# Patient Record
Sex: Female | Born: 1940
Health system: Southern US, Community
[De-identification: ages and names within clinical notes are randomized; demographics above are authoritative.]

## PROBLEM LIST (undated history)

## (undated) DIAGNOSIS — K219 Gastro-esophageal reflux disease without esophagitis: Secondary | ICD-10-CM

## (undated) DIAGNOSIS — I2510 Atherosclerotic heart disease of native coronary artery without angina pectoris: Secondary | ICD-10-CM

## (undated) DIAGNOSIS — M549 Dorsalgia, unspecified: Secondary | ICD-10-CM

## (undated) DIAGNOSIS — I10 Essential (primary) hypertension: Principal | ICD-10-CM

## (undated) DIAGNOSIS — I119 Hypertensive heart disease without heart failure: Secondary | ICD-10-CM

## (undated) DIAGNOSIS — F419 Anxiety disorder, unspecified: Secondary | ICD-10-CM

## (undated) DIAGNOSIS — D649 Anemia, unspecified: Secondary | ICD-10-CM

## (undated) DIAGNOSIS — E559 Vitamin D deficiency, unspecified: Secondary | ICD-10-CM

## (undated) DIAGNOSIS — E785 Hyperlipidemia, unspecified: Secondary | ICD-10-CM

## (undated) DIAGNOSIS — D696 Thrombocytopenia, unspecified: Secondary | ICD-10-CM

## (undated) DIAGNOSIS — I7090 Unspecified atherosclerosis: Secondary | ICD-10-CM

## (undated) HISTORY — PX: SHOULDER SURGERY: SHX246

## (undated) HISTORY — DX: Anxiety disorder, unspecified: F41.9

## (undated) HISTORY — DX: Gastro-esophageal reflux disease without esophagitis: K21.9

## (undated) HISTORY — DX: Essential (primary) hypertension: I10

## (undated) HISTORY — PX: EYE SURGERY: SHX253

## (undated) HISTORY — PX: TONSILLECTOMY: SUR1361

## (undated) HISTORY — DX: Vitamin D deficiency, unspecified: E55.9

---

## 1998-06-01 ENCOUNTER — Other Ambulatory Visit: Admission: RE | Admit: 1998-06-01 | Discharge: 1998-06-01 | Payer: Self-pay | Admitting: Physician Assistant

## 1999-04-30 ENCOUNTER — Ambulatory Visit (HOSPITAL_COMMUNITY): Admission: RE | Admit: 1999-04-30 | Discharge: 1999-04-30 | Payer: Self-pay | Admitting: Internal Medicine

## 1999-06-18 ENCOUNTER — Other Ambulatory Visit: Admission: RE | Admit: 1999-06-18 | Discharge: 1999-06-18 | Payer: Self-pay | Admitting: Gynecology

## 1999-11-02 ENCOUNTER — Encounter: Payer: Self-pay | Admitting: Internal Medicine

## 1999-11-02 ENCOUNTER — Encounter: Admission: RE | Admit: 1999-11-02 | Discharge: 1999-11-02 | Payer: Self-pay | Admitting: Internal Medicine

## 2000-04-18 ENCOUNTER — Encounter: Payer: Self-pay | Admitting: Internal Medicine

## 2000-04-18 ENCOUNTER — Encounter: Admission: RE | Admit: 2000-04-18 | Discharge: 2000-04-18 | Payer: Self-pay | Admitting: Internal Medicine

## 2000-05-27 ENCOUNTER — Ambulatory Visit (HOSPITAL_COMMUNITY): Admission: RE | Admit: 2000-05-27 | Discharge: 2000-05-27 | Payer: Self-pay | Admitting: Urology

## 2000-05-27 ENCOUNTER — Encounter (INDEPENDENT_AMBULATORY_CARE_PROVIDER_SITE_OTHER): Payer: Self-pay

## 2000-06-18 ENCOUNTER — Other Ambulatory Visit: Admission: RE | Admit: 2000-06-18 | Discharge: 2000-06-18 | Payer: Self-pay | Admitting: Gynecology

## 2001-04-13 ENCOUNTER — Encounter: Payer: Self-pay | Admitting: Cardiology

## 2001-04-13 ENCOUNTER — Ambulatory Visit (HOSPITAL_COMMUNITY): Admission: RE | Admit: 2001-04-13 | Discharge: 2001-04-13 | Payer: Self-pay | Admitting: Cardiology

## 2001-06-18 ENCOUNTER — Other Ambulatory Visit: Admission: RE | Admit: 2001-06-18 | Discharge: 2001-06-18 | Payer: Self-pay | Admitting: Gynecology

## 2001-08-07 ENCOUNTER — Ambulatory Visit (HOSPITAL_COMMUNITY): Admission: RE | Admit: 2001-08-07 | Discharge: 2001-08-07 | Payer: Self-pay | Admitting: *Deleted

## 2001-08-27 ENCOUNTER — Encounter: Payer: Self-pay | Admitting: Emergency Medicine

## 2001-08-27 ENCOUNTER — Emergency Department (HOSPITAL_COMMUNITY): Admission: EM | Admit: 2001-08-27 | Discharge: 2001-08-27 | Payer: Self-pay | Admitting: Emergency Medicine

## 2002-01-25 ENCOUNTER — Ambulatory Visit (HOSPITAL_COMMUNITY): Admission: RE | Admit: 2002-01-25 | Discharge: 2002-01-25 | Payer: Self-pay | Admitting: *Deleted

## 2002-01-26 ENCOUNTER — Encounter: Payer: Self-pay | Admitting: Cardiovascular Disease

## 2002-01-26 ENCOUNTER — Emergency Department (HOSPITAL_COMMUNITY): Admission: EM | Admit: 2002-01-26 | Discharge: 2002-01-26 | Payer: Self-pay | Admitting: Emergency Medicine

## 2002-02-01 ENCOUNTER — Ambulatory Visit (HOSPITAL_COMMUNITY): Admission: RE | Admit: 2002-02-01 | Discharge: 2002-02-01 | Payer: Self-pay | Admitting: *Deleted

## 2002-02-25 ENCOUNTER — Ambulatory Visit (HOSPITAL_COMMUNITY): Admission: RE | Admit: 2002-02-25 | Discharge: 2002-02-25 | Payer: Self-pay | Admitting: *Deleted

## 2002-02-25 ENCOUNTER — Encounter: Payer: Self-pay | Admitting: *Deleted

## 2002-03-18 ENCOUNTER — Ambulatory Visit (HOSPITAL_COMMUNITY): Admission: RE | Admit: 2002-03-18 | Discharge: 2002-03-18 | Payer: Self-pay | Admitting: Pulmonary Disease

## 2002-03-18 ENCOUNTER — Encounter: Payer: Self-pay | Admitting: Pulmonary Disease

## 2002-04-06 ENCOUNTER — Ambulatory Visit (HOSPITAL_COMMUNITY): Admission: RE | Admit: 2002-04-06 | Discharge: 2002-04-06 | Payer: Self-pay | Admitting: Pulmonary Disease

## 2002-04-06 ENCOUNTER — Encounter: Payer: Self-pay | Admitting: Pulmonary Disease

## 2002-04-23 ENCOUNTER — Encounter: Admission: RE | Admit: 2002-04-23 | Discharge: 2002-04-23 | Payer: Self-pay | Admitting: Pulmonary Disease

## 2002-04-23 ENCOUNTER — Encounter: Payer: Self-pay | Admitting: Pulmonary Disease

## 2002-06-21 ENCOUNTER — Other Ambulatory Visit: Admission: RE | Admit: 2002-06-21 | Discharge: 2002-06-21 | Payer: Self-pay | Admitting: Gynecology

## 2003-01-26 ENCOUNTER — Encounter: Payer: Self-pay | Admitting: Emergency Medicine

## 2003-01-26 ENCOUNTER — Emergency Department (HOSPITAL_COMMUNITY): Admission: EM | Admit: 2003-01-26 | Discharge: 2003-01-26 | Payer: Self-pay | Admitting: Emergency Medicine

## 2003-06-22 ENCOUNTER — Other Ambulatory Visit: Admission: RE | Admit: 2003-06-22 | Discharge: 2003-06-22 | Payer: Self-pay | Admitting: Gynecology

## 2004-09-13 ENCOUNTER — Other Ambulatory Visit: Admission: RE | Admit: 2004-09-13 | Discharge: 2004-09-13 | Payer: Self-pay | Admitting: Family Medicine

## 2005-01-14 ENCOUNTER — Ambulatory Visit (HOSPITAL_COMMUNITY): Admission: RE | Admit: 2005-01-14 | Discharge: 2005-01-14 | Payer: Self-pay | Admitting: Gastroenterology

## 2005-01-14 ENCOUNTER — Encounter (INDEPENDENT_AMBULATORY_CARE_PROVIDER_SITE_OTHER): Payer: Self-pay | Admitting: Specialist

## 2005-02-20 ENCOUNTER — Ambulatory Visit (HOSPITAL_COMMUNITY): Admission: RE | Admit: 2005-02-20 | Discharge: 2005-02-20 | Payer: Self-pay | Admitting: Obstetrics and Gynecology

## 2005-03-06 ENCOUNTER — Ambulatory Visit (HOSPITAL_COMMUNITY): Admission: RE | Admit: 2005-03-06 | Discharge: 2005-03-06 | Payer: Self-pay | Admitting: Obstetrics and Gynecology

## 2005-04-02 ENCOUNTER — Ambulatory Visit: Admission: RE | Admit: 2005-04-02 | Discharge: 2005-04-02 | Payer: Self-pay | Admitting: Family Medicine

## 2005-04-03 ENCOUNTER — Ambulatory Visit (HOSPITAL_COMMUNITY): Admission: RE | Admit: 2005-04-03 | Discharge: 2005-04-03 | Payer: Self-pay | Admitting: Gastroenterology

## 2005-04-12 ENCOUNTER — Ambulatory Visit: Admission: RE | Admit: 2005-04-12 | Discharge: 2005-04-12 | Payer: Self-pay | Admitting: Family Medicine

## 2005-06-06 ENCOUNTER — Emergency Department (HOSPITAL_COMMUNITY): Admission: EM | Admit: 2005-06-06 | Discharge: 2005-06-06 | Payer: Self-pay | Admitting: Emergency Medicine

## 2006-03-21 ENCOUNTER — Encounter: Admission: RE | Admit: 2006-03-21 | Discharge: 2006-03-21 | Payer: Self-pay | Admitting: Cardiovascular Disease

## 2006-09-13 ENCOUNTER — Emergency Department (HOSPITAL_COMMUNITY): Admission: EM | Admit: 2006-09-13 | Discharge: 2006-09-13 | Payer: Self-pay | Admitting: Emergency Medicine

## 2007-06-03 ENCOUNTER — Emergency Department (HOSPITAL_COMMUNITY): Admission: EM | Admit: 2007-06-03 | Discharge: 2007-06-04 | Payer: Self-pay | Admitting: Emergency Medicine

## 2007-06-25 ENCOUNTER — Encounter: Admission: RE | Admit: 2007-06-25 | Discharge: 2007-06-25 | Payer: Self-pay | Admitting: Neurological Surgery

## 2008-03-04 ENCOUNTER — Emergency Department (HOSPITAL_COMMUNITY): Admission: EM | Admit: 2008-03-04 | Discharge: 2008-03-04 | Payer: Self-pay | Admitting: Emergency Medicine

## 2008-04-16 ENCOUNTER — Encounter (INDEPENDENT_AMBULATORY_CARE_PROVIDER_SITE_OTHER): Payer: Self-pay | Admitting: Emergency Medicine

## 2008-04-16 ENCOUNTER — Ambulatory Visit: Payer: Self-pay | Admitting: Vascular Surgery

## 2008-04-16 ENCOUNTER — Inpatient Hospital Stay (HOSPITAL_COMMUNITY): Admission: EM | Admit: 2008-04-16 | Discharge: 2008-04-19 | Payer: Self-pay | Admitting: Emergency Medicine

## 2008-09-08 ENCOUNTER — Encounter: Admission: RE | Admit: 2008-09-08 | Discharge: 2008-09-08 | Payer: Self-pay | Admitting: Cardiovascular Disease

## 2008-10-04 ENCOUNTER — Encounter: Admission: RE | Admit: 2008-10-04 | Discharge: 2008-10-04 | Payer: Self-pay | Admitting: Gastroenterology

## 2009-01-26 ENCOUNTER — Encounter: Admission: RE | Admit: 2009-01-26 | Discharge: 2009-01-26 | Payer: Self-pay | Admitting: Cardiovascular Disease

## 2009-04-20 ENCOUNTER — Encounter: Admission: RE | Admit: 2009-04-20 | Discharge: 2009-04-20 | Payer: Self-pay | Admitting: Family Medicine

## 2009-09-03 ENCOUNTER — Inpatient Hospital Stay (HOSPITAL_COMMUNITY): Admission: EM | Admit: 2009-09-03 | Discharge: 2009-09-06 | Payer: Self-pay | Admitting: Emergency Medicine

## 2009-09-04 ENCOUNTER — Encounter (INDEPENDENT_AMBULATORY_CARE_PROVIDER_SITE_OTHER): Payer: Self-pay | Admitting: Cardiovascular Disease

## 2010-07-05 ENCOUNTER — Encounter: Admission: RE | Admit: 2010-07-05 | Discharge: 2010-07-05 | Payer: Self-pay | Admitting: Family Medicine

## 2010-08-20 ENCOUNTER — Encounter: Admission: RE | Admit: 2010-08-20 | Discharge: 2010-08-20 | Payer: Self-pay | Admitting: Family Medicine

## 2010-10-30 ENCOUNTER — Encounter: Admission: RE | Admit: 2010-10-30 | Discharge: 2010-10-30 | Payer: Self-pay | Admitting: Gastroenterology

## 2010-11-15 ENCOUNTER — Encounter: Admission: RE | Admit: 2010-11-15 | Discharge: 2010-11-15 | Payer: Self-pay | Admitting: Family Medicine

## 2011-03-19 ENCOUNTER — Other Ambulatory Visit: Payer: Self-pay | Admitting: Family Medicine

## 2011-03-19 DIAGNOSIS — R921 Mammographic calcification found on diagnostic imaging of breast: Secondary | ICD-10-CM

## 2011-04-05 LAB — CARDIAC PANEL(CRET KIN+CKTOT+MB+TROPI)
CK, MB: 2.2 ng/mL (ref 0.3–4.0)
CK, MB: 3.2 ng/mL (ref 0.3–4.0)
Relative Index: 1.5 (ref 0.0–2.5)
Relative Index: 1.9 (ref 0.0–2.5)
Total CK: 151 U/L (ref 7–177)
Total CK: 168 U/L (ref 7–177)
Troponin I: 0.02 ng/mL (ref 0.00–0.06)
Troponin I: 0.03 ng/mL (ref 0.00–0.06)

## 2011-04-05 LAB — BASIC METABOLIC PANEL
CO2: 27 mEq/L (ref 19–32)
Calcium: 8.7 mg/dL (ref 8.4–10.5)
Chloride: 114 mEq/L — ABNORMAL HIGH (ref 96–112)
Creatinine, Ser: 0.97 mg/dL (ref 0.4–1.2)
GFR calc Af Amer: >60 mL/min (ref >60)
Glucose, Bld: 95 mg/dL (ref 70–99)

## 2011-04-05 LAB — CBC
HCT: 30.1 % — ABNORMAL LOW (ref 36.0–46.0)
HCT: 36.8 % (ref 36.0–46.0)
HCT: 41.1 % (ref 36.0–46.0)
Hemoglobin: 11.7 g/dL — ABNORMAL LOW (ref 12.0–15.0)
Hemoglobin: 13.1 g/dL (ref 12.0–15.0)
Hemoglobin: 9.7 g/dL — ABNORMAL LOW (ref 12.0–15.0)
MCHC: 31.9 g/dL (ref 30.0–36.0)
MCHC: 32.2 g/dL (ref 30.0–36.0)
MCV: 73.5 fL — ABNORMAL LOW (ref 78.0–100.0)
MCV: 73.5 fL — ABNORMAL LOW (ref 78.0–100.0)
Platelets: 102 K/uL — ABNORMAL LOW (ref 150–400)
Platelets: 125 K/uL — ABNORMAL LOW (ref 150–400)
Platelets: 150 10*3/uL (ref 150–400)
RBC: 4.1 MIL/uL (ref 3.87–5.11)
RBC: 5 MIL/uL (ref 3.87–5.11)
RDW: 14.3 % (ref 11.5–15.5)
RDW: 14.6 % (ref 11.5–15.5)
WBC: 4.4 K/uL (ref 4.0–10.5)
WBC: 5.3 10*3/uL (ref 4.0–10.5)
WBC: 5.3 K/uL (ref 4.0–10.5)

## 2011-04-05 LAB — DIFFERENTIAL
Basophils Absolute: 0 10*3/uL (ref 0.0–0.1)
Basophils Absolute: 0 K/uL (ref 0.0–0.1)
Basophils Relative: 0 % (ref 0–1)
Basophils Relative: 1 % (ref 0–1)
Eosinophils Absolute: 0 10*3/uL (ref 0.0–0.7)
Eosinophils Absolute: 0 K/uL (ref 0.0–0.7)
Eosinophils Relative: 1 % (ref 0–5)
Lymphocytes Relative: 53 % — ABNORMAL HIGH (ref 12–46)
Lymphs Abs: 2.8 K/uL (ref 0.7–4.0)
Monocytes Absolute: 0.4 10*3/uL (ref 0.1–1.0)
Monocytes Absolute: 0.4 K/uL (ref 0.1–1.0)
Monocytes Relative: 7 % (ref 3–12)
Monocytes Relative: 8 % (ref 3–12)
Neutro Abs: 2.1 K/uL (ref 1.7–7.7)
Neutrophils Relative %: 39 % — ABNORMAL LOW (ref 43–77)

## 2011-04-05 LAB — POCT I-STAT, CHEM 8
HCT: 41 % (ref 36.0–46.0)
Hemoglobin: 13.9 g/dL (ref 12.0–15.0)
Potassium: 3.4 mEq/L — ABNORMAL LOW (ref 3.5–5.1)
Sodium: 140 mEq/L (ref 135–145)

## 2011-04-05 LAB — COMPREHENSIVE METABOLIC PANEL WITH GFR
ALT: 16 U/L (ref 0–35)
AST: 26 U/L (ref 0–37)
Albumin: 4.2 g/dL (ref 3.5–5.2)
Alkaline Phosphatase: 101 U/L (ref 39–117)
BUN: 11 mg/dL (ref 6–23)
CO2: 23 meq/L (ref 19–32)
Calcium: 10.2 mg/dL (ref 8.4–10.5)
Chloride: 107 meq/L (ref 96–112)
Creatinine, Ser: 0.94 mg/dL (ref 0.4–1.2)
GFR calc Af Amer: >60 mL/min (ref >60)
GFR calc non Af Amer: 59 mL/min — ABNORMAL LOW (ref >60)
Glucose, Bld: 93 mg/dL (ref 70–99)
Potassium: 3.4 meq/L — ABNORMAL LOW (ref 3.5–5.1)
Sodium: 141 meq/L (ref 135–145)
Total Bilirubin: 1.1 mg/dL (ref 0.3–1.2)
Total Protein: 7.9 g/dL (ref 6.0–8.3)

## 2011-04-05 LAB — POCT CARDIAC MARKERS
CKMB, poc: 1.9 ng/mL (ref 1.0–8.0)
CKMB, poc: 2 ng/mL (ref 1.0–8.0)
CKMB, poc: <1 ng/mL — ABNORMAL LOW (ref 1.0–8.0)
Myoglobin, poc: 112 ng/mL (ref 12–200)
Myoglobin, poc: 84.1 ng/mL (ref 12–200)
Troponin i, poc: <0.05 ng/mL (ref 0.00–0.09)

## 2011-04-05 LAB — LIPID PANEL
Cholesterol: 240 mg/dL — ABNORMAL HIGH (ref 0–200)
HDL: 80 mg/dL (ref >39)
LDL Cholesterol: 154 mg/dL — ABNORMAL HIGH (ref 0–99)
Total CHOL/HDL Ratio: 3 ratio
Triglycerides: 31 mg/dL (ref <150)
VLDL: 6 mg/dL (ref 0–40)

## 2011-04-05 LAB — PROTIME-INR
INR: 1.1 (ref 0.00–1.49)
Prothrombin Time: 13.7 s (ref 11.6–15.2)

## 2011-04-05 LAB — HEPARIN LEVEL (UNFRACTIONATED): Heparin Unfractionated: 1.34 [IU]/mL — ABNORMAL HIGH (ref 0.30–0.70)

## 2011-04-18 ENCOUNTER — Ambulatory Visit
Admission: RE | Admit: 2011-04-18 | Discharge: 2011-04-18 | Disposition: A | Discharge status: Home / Self Care | Payer: PRIVATE HEALTH INSURANCE | Source: Ambulatory Visit | Attending: Family Medicine | Admitting: Family Medicine

## 2011-04-18 ENCOUNTER — Other Ambulatory Visit: Payer: Self-pay | Admitting: Family Medicine

## 2011-04-18 DIAGNOSIS — N6310 Unspecified lump in the right breast, unspecified quadrant: Secondary | ICD-10-CM

## 2011-04-18 DIAGNOSIS — R921 Mammographic calcification found on diagnostic imaging of breast: Secondary | ICD-10-CM

## 2011-05-14 NOTE — H&P (Signed)
NAMEELLAH, OTTE          ACCOUNT NO.:  [id number]   MEDICAL RECORD NO.:  [id number]          PATIENT TYPE:  EMS   LOCATION:  ED                           FACILITY:  [facility]   PHYSICIAN:  Corinna L. Lendell Caprice, MDDATE OF BIRTH:  10-22-1941   DATE OF ADMISSION:  04/16/2008  DATE OF DISCHARGE:                              HISTORY & PHYSICAL   CHIEF COMPLAINT:  Right hip and buttock pain.   HISTORY OF PRESENT ILLNESS:  Ms. Navia is a pleasant 70 year old  black female patient of Dr. Cliffton Asters who presents with sudden onset of  right hip and buttock pain while walking yesterday.  She was unable to  sleep much it hurt so severely.  She took two Percocet in the middle of  the night which provided some relief.  She went to the [facility] walk-in  clinic and received Toradol without any improvement in her symptoms.  She was therefore sent to the emergency room for further evaluation.  The patient says the pain is constant.  She has not fallen. She has no  bruising.  She feels a little better currently after receiving 800 mg of  ibuprofen here in the emergency room.  She is very reluctant to take any  opiate analgesics.   PAST MEDICAL HISTORY:  Hypertension, hyperlipidemia, gastroesophageal  reflux disease, anxiety, chronic neck pain, interstitial cystitis.   MEDICATIONS:  1. Benicar 40 mg a day.  2. Crestor 5 mg a day.  3. Aciphex as needed.  4. Xanax as needed.  5. Percocet as needed.   ALLERGIES:  There is reported allergy to CODEINE, EPINEPHRINE, IV  CONTRAST, NITROGLYCERIN, PENICILLIN, SULFA.   SOCIAL HISTORY:  She is Jehovah's Witness.  She is here with her  husband.  She does not drink, smoke or use drugs.   FAMILY HISTORY:  Is noncontributory.   REVIEW OF SYSTEMS:  As above, otherwise negative.   PHYSICAL EXAMINATION:  VITAL SIGNS:  Temperature is 99.5, blood pressure  131/69, pulse 69, respiratory rate 16, oxygen saturation 93%.  GENERAL:  The patient is well-nourished,  well-developed and in no acute  distress.  HEENT:  Normocephalic, atraumatic.  Pupils equal, round, reactive to  light.  Moist mucous membranes.  NECK:  Neck is supple.  LUNGS:  Lungs are clear to auscultation bilaterally without wheezes,  rhonchi or rales.  CARDIOVASCULAR:  Regular rate and rhythm without murmurs, gallops or  rubs.  ABDOMEN:  Normal bowel sounds, soft, nontender, nondistended.  GU:  Deferred.  RECTAL:  Deferred.  EXTREMITIES:  She has tenderness over her right buttock and hip area.  She has no ecchymoses.  No swelling.  She has difficulty bearing weight  and almost fell when I asked her to walk.  SKIN:  No rash.  PSYCHIATRIC:  Normal affect.  NEUROLOGIC:  Alert and oriented.  Cranial nerves and sensorimotor exam  are intact.   Hip x-ray shows mild degenerative changes, consider followup MRI.  Doppler of the right leg shows no DVT.   ASSESSMENT AND PLAN:  1. Right hip pain, sudden onset with gait instability.  The patient      will be  placed on 24 hour observation.  I will get a physical      therapy consult.  I will get an MRI and continue nonsteroidal      antiinflammatories.  She does not want opiate analgesics if at all      possible and I will order Ultram as needed and Tylenol as needed.  2. Gastroesophageal reflux disease.  Continue proton pump inhibitor.  3. Hyperlipidemia with an elevated CPK, I will stop this and monitor      CPKs to rule out mild rhabdomyolysis.  I will also check liver      function tests.  4. Hypertension.  Continue Benicar.  5. Interstitial cystitis.      Corinna L. Lendell Caprice, MD  Electronically Signed     CLS/MEDQ  D:  04/16/2008  T:  04/16/2008  Job:  [id number]   cc:   Stacie Acres. Cliffton Asters, M.D.  Fax: [phone]

## 2011-05-14 NOTE — Op Note (Signed)
Donna Baldwin, Donna Baldwin          ACCOUNT NO.:  [id number]   MEDICAL RECORD NO.:  [id number]          PATIENT TYPE:  INP   LOCATION:  1504                         FACILITY:  [facility]   PHYSICIAN:  Jene Every, M.D.    DATE OF BIRTH:  1941/02/11   DATE OF PROCEDURE:  04/18/2008  DATE OF DISCHARGE:                               OPERATIVE REPORT   CHIEF COMPLAINT:  Right hip pain.   HISTORY:  A pleasant 70 year old female with sudden onset of right hip  pain this past Saturday when walking across the lawn after getting out  of a car.  She had not had any trauma, did not fall.  She had fairly  significant pain and was unable to sleep.  Seen in the [facility].  Received Toradol, had persistent pain and was admitted  following that for a further evaluation.  She subsequently underwent an  MRI of her right hip which indicated calcific tendinitis of the right  hip and was consulted for treatment recommendations.   She denies numbness or tingling in the lower extremity, fever or chills,  pain that awakens her at night.   PAST MEDICAL HISTORY:  Significant for  1. Hypertension.  2. Hyperlipidemia.  3. GERD.  4. Anxiety.  5. Interstitial cystitis.  6. Lumbar spondylosis.   MEDICATIONS:  Ultram, Benicar, Crestor, AcipHex, Xanax and Percocet.   SOCIAL HISTORY:  Jehovah's Witness.  No tobacco.  Lives with her  husband.   FAMILY HISTORY:  Noncontributory.   ALLERGIES:  CODEINE, EPINEPHRINE, IV CONTRAST, NITROGLYCERIN,  PENICILLIN, SULFA.   PHYSICAL EXAMINATION:  A healthy female in mild distress.  Mood and  affect is appropriate.  Straight leg raising on the right produces  buttock pain.  She is exquisitely tender over the right greater  trochanter.  Knee and ankle exam is unremarkable.  Pulses are intact.  Sensory exam was intact.  Pelvis was stable.  The contralateral hip exam  was unremarkable.   The patient is afebrile.   LABORATORY DATA:  Erythrocyte sedimentation  rate was 15.   Doppler was negative for DVT, small Baker's cyst on the right.   MRI demonstrates calcifications in the soft tissues to the femoral shaft  right over the trochanter.  Acute inflammation in the vastus lateralis.   History of lumbar spondylosis.   PLAN:  A long discussion with Ms. Montone concerning current  situation.  At this point in time, she is disabled by her pain and  symptomatology and it has been refractory to rest and proceed with a  corticosteroid injection of the right hip.  Medications will be ordered  and proceed when available.      Jene Every, M.D.  Electronically Signed     JB/MEDQ  D:  04/18/2008  T:  04/18/2008  Job:  [id number]

## 2011-05-17 NOTE — Procedures (Signed)
[facility]  Patient:    Donna Baldwin, Donna Baldwin                 MRN: [id number] Adm. Date:  [id number] Disc. Date: [id number] Attending:  Londell Moh CC:         Jamison Neighbor, M.D.             Antony Madura, M.D.                           Procedure Report  INDICATION:  This is a 70 year old female who has a problem with lower urinary tract urgency, frequency and pain.  She was referred by Dr. Antony Madura for evaluation for possible interstitial cystitis.  The patient complains of pain in the urethra and clitoris as well as pain across the lower abdomen with associated burning.  Her urinalyses have been unremarkable with no signs of infection.  The patient understands the risks and benefits of the procedure. She understands that she will most likely have an increase in her symptoms with increasing urgency, frequency and pain for at least one week and there is no guarantee this will be anything other than a diagnostic procedure; she does know, however, there is a 50-70% chance that she will have decrease in pain, at least temporarily.  She knows that long-term therapy will be required.  She gave full and informed consent.  SURGEON:  Jamison Neighbor, M.D.  DESCRIPTION OF PROCEDURE:  After the successful induction of general anesthesia, the patient was placed in the dorsal lithotomy position and prepped with Betadine and draped in the usual sterile fashion.  Careful bimanual examination revealed no significant abnormalities in the pelvis, specifically, no evidence of urethral diverticulum, urethrocele, cystocele, rectocele, enterocele or any vault prolapse.  There were no masses on bimanual exam.  The urethra was calibrated to accept a 30-French female urethral sound with a caliber of 32-French.  The bladder was carefully inspected.  It was free of any tumor or stones.  Both ureteral orifices were normal in configuration and location.  The  bladder was distended at a pressure of 100 cmH2O for five minutes.  When the bladder was drained, patient was found to have glomerulations in all four quadrants and was found to have a nearly normal bladder capacity of just over 1000 cc.  This would suggest that if the patient has interstitial cystitis, this is certainly being caught quite early. A bladder biopsy was taken and sent for a mast cell analysis and biopsy site was cauterized and a mixture of Marcaine and Pyridium was left in the bladder. Marcaine and Kenalog were injected periurethrally.  The patient tolerated the procedure well and was taken to the recovery room in good condition.  She will be given a prescription for Lorcet Plus to take as needed for pain, Pyridium Plus for postoperative spasms and doxycycline.  In the recovery room, she was given ______ suppository as well as intraoperative Toradol.  She will return to the office in three weeks time. DD:  05/27/00 TD:  05/29/00 Job: [id number]

## 2011-05-17 NOTE — Discharge Summary (Signed)
NAMENASHLEY, Donna Baldwin          ACCOUNT NO.:  [id number]   MEDICAL RECORD NO.:  [id number]          PATIENT TYPE:  INP   LOCATION:  1504                         FACILITY:  [facility]   PHYSICIAN:  Corinna L. Lendell Caprice, MDDATE OF BIRTH:  1941-11-12   DATE OF ADMISSION:  04/16/2008  DATE OF DISCHARGE:  04/19/2008                               DISCHARGE SUMMARY   DISCHARGE DIAGNOSES:  1. Gait disorder secondary to right hip pain.  2. Right greater trochanteric bursitis with inflammation of the vastus      lateralis, distal gluteus maximus and abductor magnus muscles      probably due to adjacent calcific bursitis.  3. Hypertension.  4. Hyperlipidemia.  5. Gastroesophageal reflux disease.  6. Interstitial cystitis.  7. Hyperlipidemia.   DISCHARGE MEDICATIONS:  1. Tylenol as needed for pain.  2. Ultram 50 mg p.o. q.6 h p.r.n. pain.  3. Continue Benicar 40 mg a day.  4. Calcium with vitamin D supplements.  5. Aciphex.  6. Xanax 0.5 mg p.r.n. anxiety.  7. Hold Crestor.   ACTIVITY:  She may use a walker.  She is to go to outpatient physical  therapy.  Follow up with Dr. Laurann Montana in 4 weeks to check CBC and  CPK.  Follow-up with Dr. Jillyn Hidden in 1 week.   CONDITION:  Stable.   DIET:  Low salt, low cholesterol.   CONSULTATIONS:  Dr. Jillyn Hidden.   PROCEDURES:  Corticosteroid injection into the right hip for bursitis.   LABORATORY DATA:  CBC is significant for a platelet count of 107,  hemoglobin 11.2, hematocrit 35, potassium on admission 5.2 and 4.1 at  discharge, total bilirubin 1.6, SGOT 38, total CPK 187.  Repeat CPK of  146.   SPECIAL STUDIES/RADIOLOGY:  Right hip x-ray showed mild degenerative  change.  MRI of the right hip showed calcific greater trochanteric  bursitis with inflammation of the vastus lateralis, gluteus maximus and  abductor magnus muscles.   HISTORY AND HOSPITAL COURSE:  Please see H&P for details.  Briefly, Ms.  Riedinger is a 70 year old black female patient  of Dr. Cliffton Asters who had  sudden onset of right hip and buttock pain while walking.  She was given  Toradol in the [facility] without much relief and therefore  sent to the emergency room. She had been given 800 mg of ibuprofen in  the emergency room which helped. She had a temperature of 99.5,  otherwise normal vital signs.  She had tenderness over her right buttock  and hip area. She was unable to bear weight.  She was admitted for  workup and found to have bursitis and myositis. Orthopedics was  consulted and did the corticosteroid injection. The patient got physical  therapy.  Her pain improved and she was able to bear weight and has been  given a walker. Her Crestor was stopped due to the elevated CPK.  This  will need to be rechecked, but I do not think this is the cause of her  elevated CPK and her myositis.  She was noted to be thrombocytopenic and  this will need to be worked up as  an outpatient and CBC repeated.      Corinna L. Lendell Caprice, MD  Electronically Signed     CLS/MEDQ  D:  07/13/2008  T:  07/13/2008  Job:  [id number]

## 2011-05-17 NOTE — Op Note (Signed)
NAMEHOORAIN, KOZAKIEWICZ          ACCOUNT NO.:  [id number]   MEDICAL RECORD NO.:  [id number]          PATIENT TYPE:  AMB   LOCATION:  ENDO                         FACILITY:  [facility]   PHYSICIAN:  John C. Madilyn Fireman, M.D.    DATE OF BIRTH:  06-09-1941   DATE OF PROCEDURE:  01/14/2005  DATE OF DISCHARGE:                                 OPERATIVE REPORT   PROCEDURE PERFORMED:  Colonoscopy.   INDICATIONS FOR PROCEDURE:  Average risk colon cancer screening in a 70-year-  old patient with no recent screening.   PROCEDURE:  The patient was placed in the left lateral decubitus position  and placed on the pulse monitor with continuous low-flow oxygen delivered by  nasal cannula. She was sedated with 50 mcg IV fentanyl and 6 milligrams IV  Versed.  The Olympus video colonoscope was inserted into the rectum and  advanced to cecum, confirmed by transillumination of McBurney's point,  visualization of ileocecal valve and appendiceal orifice. The prep was  excellent. The cecum, ascending, transverse and descending colon appeared  normal with no masses, polyps, diverticula or other mucosal abnormalities.  Within the sigmoid colon there was an 8 mm sessile polyp that was removed by  snare. The remainder of the sigmoid and rectum appeared normal with no  further polyps, masses, diverticula or other mucosal abnormalities. The  scope was then withdrawn and the patient returned to the recovery room in  stable condition. She tolerated the procedure well. There were no immediate  complications.   IMPRESSION:  1.  Sigmoid polyp, otherwise normal study.   PLAN:  Await histology to determine method and interval for future colon  screening.      JCH/MEDQ  D:  01/14/2005  T:  01/14/2005  Job:  [id number]   cc:   Dario Guardian, M.D.  [address]  [place]  [place] [address]  Fax: [phone]

## 2011-05-17 NOTE — Op Note (Signed)
NAMEJANITA, Donna Baldwin          ACCOUNT NO.:  [id number]   MEDICAL RECORD NO.:  [id number]          PATIENT TYPE:  AMB   LOCATION:  ENDO                         FACILITY:  [facility]   PHYSICIAN:  John C. Madilyn Fireman, M.D.    DATE OF BIRTH:  Apr 29, 1941   DATE OF PROCEDURE:  04/03/2005  DATE OF DISCHARGE:                                 OPERATIVE REPORT   PROCEDURE:  Esophagogastroduodenoscopy with biopsy.   INDICATIONS FOR PROCEDURE:  Epigastric abdominal pain and some dysphagia and  the patient with a questionable thickening of the GE junction and pyloric  outlet on CT scan.   PROCEDURE:  The patient was placed in the left lateral decubitus position  and placed on the pulse monitor with continuous low-flow oxygen delivered by  nasal cannula.  She was sedated with 50 mcg IV fentanyl and 6 mg IV Versed.  The Olympus video endoscope was advanced under direct vision into the  oropharynx and esophagus.  The esophagus was straight and of normal caliber  with the squamocolumnar line at 38 cm.  There was no visible hiatal hernia,  ring, stricture, or other abnormality of the GE junction.  The stomach was  entered, and a small amount of liquid secretions were suctioned from the  fundus.  Retroflexed view of the cardia was unremarkable.  The fundus, body,  antrum, and pylorus all appeared normal.  The duodenum was entered, and both  the bulb and second portion were well inspected and appeared to be within  normal limits.  The scope was withdrawn back into the stomach and a CLO-test  obtained.  The scope was then withdrawn, and the patient returned to the  recovery room in stable condition.  She tolerated the procedure well, and  there were no immediate complications.   IMPRESSION:  Basically normal endoscopy.   PLAN:  Await CLO-test and treat for eradication if Helicobacter is positive.      JCH/MEDQ  D:  04/03/2005  T:  04/03/2005  Job:  [id number]   cc:   Dario Guardian, M.D.  [address]  [place]  [place] [address]  Fax: [phone]

## 2011-09-24 LAB — CBC
HCT: 31.7 — ABNORMAL LOW
HCT: 33.8 — ABNORMAL LOW
MCHC: 32.1
MCHC: 32.9
MCV: 71.9 — ABNORMAL LOW
MCV: 72 — ABNORMAL LOW
MCV: 72.4 — ABNORMAL LOW
Platelets: 129 — ABNORMAL LOW
RBC: 4.4
RDW: 13.2
WBC: 4.5
WBC: 6.2

## 2011-09-24 LAB — COMPREHENSIVE METABOLIC PANEL
ALT: 23
Alkaline Phosphatase: 82
BUN: 13
CO2: 23
Chloride: 108
Glucose, Bld: 100 — ABNORMAL HIGH
Potassium: 5.2 — ABNORMAL HIGH
Total Bilirubin: 1.6 — ABNORMAL HIGH

## 2011-09-24 LAB — DIFFERENTIAL
Basophils Absolute: 0.1
Basophils Relative: 2 — ABNORMAL HIGH
Eosinophils Absolute: 0
Eosinophils Relative: 1
Neutrophils Relative %: 77

## 2011-09-24 LAB — CK: Total CK: 146

## 2011-09-24 LAB — SEDIMENTATION RATE: Sed Rate: 15

## 2011-09-24 LAB — BASIC METABOLIC PANEL
CO2: 25
Chloride: 113 — ABNORMAL HIGH
Creatinine, Ser: 1.01
GFR calc Af Amer: >60
Potassium: 4.1

## 2012-01-21 ENCOUNTER — Ambulatory Visit
Admission: RE | Admit: 2012-01-21 | Discharge: 2012-01-21 | Disposition: A | Discharge status: Home / Self Care | Payer: Medicare Other | Source: Ambulatory Visit | Attending: Family Medicine | Admitting: Family Medicine

## 2012-01-21 ENCOUNTER — Other Ambulatory Visit: Payer: Self-pay | Admitting: Family Medicine

## 2012-01-21 DIAGNOSIS — J40 Bronchitis, not specified as acute or chronic: Secondary | ICD-10-CM

## 2012-02-17 ENCOUNTER — Other Ambulatory Visit (HOSPITAL_COMMUNITY): Payer: Self-pay | Admitting: Cardiology

## 2012-02-19 ENCOUNTER — Encounter (HOSPITAL_COMMUNITY)
Admission: RE | Admit: 2012-02-19 | Discharge: 2012-02-19 | Disposition: A | Discharge status: Home / Self Care | Payer: Medicare Other | Source: Ambulatory Visit | Attending: Cardiology | Admitting: Cardiology

## 2012-02-19 ENCOUNTER — Encounter (HOSPITAL_COMMUNITY): Admission: RE | Admit: 2012-02-19 | Payer: Medicare Other | Source: Ambulatory Visit

## 2012-02-19 DIAGNOSIS — R079 Chest pain, unspecified: Secondary | ICD-10-CM | POA: Insufficient documentation

## 2012-02-19 NOTE — Progress Notes (Signed)
2 IV insertion attempts made per self. IV team called and they were unable to get an IV. Patient decided to have test rescheduled and knows to call Dr. Annitta Jersey office to do so.

## 2012-04-03 ENCOUNTER — Other Ambulatory Visit: Payer: Self-pay | Admitting: Family Medicine

## 2012-04-03 DIAGNOSIS — R921 Mammographic calcification found on diagnostic imaging of breast: Secondary | ICD-10-CM

## 2012-04-09 ENCOUNTER — Ambulatory Visit
Admission: RE | Admit: 2012-04-09 | Discharge: 2012-04-09 | Disposition: A | Discharge status: Home / Self Care | Payer: Medicare Other | Source: Ambulatory Visit | Attending: Family Medicine | Admitting: Family Medicine

## 2012-04-09 DIAGNOSIS — R921 Mammographic calcification found on diagnostic imaging of breast: Secondary | ICD-10-CM

## 2012-07-04 ENCOUNTER — Emergency Department (HOSPITAL_COMMUNITY): Payer: Medicare Other

## 2012-07-04 ENCOUNTER — Other Ambulatory Visit: Payer: Self-pay

## 2012-07-04 ENCOUNTER — Emergency Department (HOSPITAL_COMMUNITY)
Admission: EM | Admit: 2012-07-04 | Discharge: 2012-07-04 | Disposition: A | Discharge status: Home / Self Care | Payer: Medicare Other | Attending: Emergency Medicine | Admitting: Emergency Medicine

## 2012-07-04 ENCOUNTER — Encounter (HOSPITAL_COMMUNITY): Payer: Self-pay

## 2012-07-04 DIAGNOSIS — R51 Headache: Secondary | ICD-10-CM | POA: Insufficient documentation

## 2012-07-04 DIAGNOSIS — M542 Cervicalgia: Secondary | ICD-10-CM | POA: Insufficient documentation

## 2012-07-04 DIAGNOSIS — I10 Essential (primary) hypertension: Secondary | ICD-10-CM | POA: Insufficient documentation

## 2012-07-04 DIAGNOSIS — Z79899 Other long term (current) drug therapy: Secondary | ICD-10-CM | POA: Insufficient documentation

## 2012-07-04 DIAGNOSIS — R42 Dizziness and giddiness: Secondary | ICD-10-CM | POA: Insufficient documentation

## 2012-07-04 DIAGNOSIS — R6884 Jaw pain: Secondary | ICD-10-CM | POA: Insufficient documentation

## 2012-07-04 HISTORY — DX: Dorsalgia, unspecified: M54.9

## 2012-07-04 HISTORY — DX: Essential (primary) hypertension: I10

## 2012-07-04 LAB — POCT I-STAT TROPONIN I
Troponin i, poc: 0 ng/mL (ref 0.00–0.08)
Troponin i, poc: 0 ng/mL (ref 0.00–0.08)

## 2012-07-04 LAB — CBC
MCV: 72.2 fL — ABNORMAL LOW (ref 78.0–100.0)
Platelets: 145 10*3/uL — ABNORMAL LOW (ref 150–400)
RBC: 5.57 MIL/uL — ABNORMAL HIGH (ref 3.87–5.11)
RDW: 14.7 % (ref 11.5–15.5)
WBC: 6.1 10*3/uL (ref 4.0–10.5)

## 2012-07-04 LAB — BASIC METABOLIC PANEL
CO2: 23 mEq/L (ref 19–32)
Chloride: 103 mEq/L (ref 96–112)
Creatinine, Ser: 0.8 mg/dL (ref 0.50–1.10)
GFR calc Af Amer: 85 mL/min — ABNORMAL LOW (ref >90)
Potassium: 3.4 mEq/L — ABNORMAL LOW (ref 3.5–5.1)
Sodium: 140 mEq/L (ref 135–145)

## 2012-07-04 MED ORDER — TRAMADOL HCL 50 MG PO TABS: 50.0000 mg | ORAL_TABLET | Freq: Four times a day (QID) | ORAL | Status: AC | PRN

## 2012-07-04 MED ORDER — NITROGLYCERIN 0.4 MG SL SUBL: 0.4000 mg | SUBLINGUAL_TABLET | SUBLINGUAL | Status: DC | PRN

## 2012-07-04 MED ORDER — ONDANSETRON HCL 4 MG/2ML IJ SOLN
4.0000 mg | Freq: Once | INTRAMUSCULAR | Status: DC
  Filled 2012-07-04: qty 2

## 2012-07-04 MED ORDER — ASPIRIN 81 MG PO CHEW
324.0000 mg | CHEWABLE_TABLET | Freq: Once | ORAL | Status: AC
  Administered 2012-07-04: 324 mg via ORAL
  Filled 2012-07-04: qty 4

## 2012-07-04 MED ORDER — SODIUM CHLORIDE 0.9 % IV BOLUS (SEPSIS)
500.0000 mL | Freq: Once | INTRAVENOUS | Status: AC
  Administered 2012-07-04: 500 mL via INTRAVENOUS

## 2012-07-04 NOTE — ED Notes (Signed)
Pt in from home with chest pain states onset yesterday  States sob with dizziness and near syncope while driving states pain on right side radiating to neck and jaw

## 2012-07-04 NOTE — ED Notes (Signed)
MD at bedside. 

## 2012-07-04 NOTE — ED Notes (Addendum)
Pt reports having pain to right jaw, neck and shoulder started yesterday. States pain came on again today while she was driving to the store and she got very sweaty and dizzy.  Denies N/V. Denies sob. Pt denies chest pain.

## 2012-07-04 NOTE — ED Provider Notes (Cosign Needed)
History     CSN: [id number]  Arrival date & time 07/04/12  1405   First MD Initiated Contact with Patient 07/04/12 1417      Chief Complaint  Patient presents with  . Jaw Pain  . Dizziness  . Weakness  . Neck Pain  . Shoulder Pain    (Consider location/radiation/quality/duration/timing/severity/associated sxs/prior treatment) Patient is a 71 y.o. female presenting with neck pain. The history is provided by the patient (the pt states she had right ant neck pain and right shoulder pain.  mild chest dischonfort today.). No language interpreter was used.  Neck Pain  This is a new problem. The current episode started 1 to 2 hours ago. The problem occurs rarely. The problem has been resolved. The pain is associated with nothing. There has been no fever. Pain location: right side of neck. The quality of the pain is described as stabbing. The pain is at a severity of 5/10. The pain is moderate. Pertinent negatives include no chest pain and no headaches.    Past Medical History  Diagnosis Date  . Hypertension   . Back pain     History reviewed. No pertinent past surgical history.  No family history on file.  History  Substance Use Topics  . Smoking status: Never Smoker   . Smokeless tobacco: Not on file  . Alcohol Use: No    OB History    Grav Para Term Preterm Abortions TAB SAB Ect Mult Living                  Review of Systems  Constitutional: Negative for fatigue.  HENT: Positive for neck pain. Negative for congestion, sinus pressure and ear discharge.   Eyes: Negative for discharge.  Respiratory: Negative for cough.   Cardiovascular: Negative for chest pain.  Gastrointestinal: Negative for abdominal pain and diarrhea.  Genitourinary: Negative for frequency and hematuria.  Musculoskeletal: Negative for back pain.  Skin: Negative for rash.  Neurological: Negative for seizures and headaches.  Hematological: Negative.   Psychiatric/Behavioral: Negative for  hallucinations.    Allergies  Codeine; Latex; Penicillins; Barbiturates; Ivp dye; Morphine and related; Petroleum jelly; Shellfish allergy; Tape; Tylox; Nitrates, organic; and Sulfa antibiotics  Home Medications   Current Outpatient Rx  Name Route Sig Dispense Refill  . IBUPROFEN 800 MG PO TABS Oral Take 800 mg by mouth every 8 (eight) hours as needed. pain    . OLMESARTAN MEDOXOMIL 40 MG PO TABS Oral Take 40 mg by mouth daily.    [name] OMEPRAZOLE 20 MG PO CPDR Oral Take 20 mg by mouth daily.    . TRAMADOL HCL 50 MG PO TABS Oral Take 1 tablet (50 mg total) by mouth every 6 (six) hours as needed for pain. 30 tablet 0    BP 130/69  Pulse 55  Temp 98.3 F (36.8 C) (Oral)  Resp 20  SpO2 100%  Physical Exam  Constitutional: She is oriented to person, place, and time. She appears well-developed.  HENT:  Head: Normocephalic and atraumatic.  Eyes: Conjunctivae and EOM are normal. No scleral icterus.  Neck: Neck supple. No thyromegaly present.  Cardiovascular: Normal rate and regular rhythm.  Exam reveals no gallop and no friction rub.   No murmur heard. Pulmonary/Chest: No stridor. She has no wheezes. She has no rales. She exhibits no tenderness.  Abdominal: She exhibits no distension. There is no tenderness. There is no rebound.  Musculoskeletal: Normal range of motion. She exhibits no edema.  Lymphadenopathy:  She has no cervical adenopathy.  Neurological: She is oriented to person, place, and time. Coordination normal.  Skin: No rash noted. No erythema.  Psychiatric: She has a normal mood and affect. Her behavior is normal.    ED Course  Procedures (including critical care time)  Labs Reviewed  CBC - Abnormal; Notable for the following:    RBC 5.57 (*)     MCV 72.2 (*)     MCH 22.8 (*)     Platelets 145 (*)     All other components within normal limits  BASIC METABOLIC PANEL - Abnormal; Notable for the following:    Potassium 3.4 (*)     Glucose, Bld 102 (*)     GFR  calc non Af Amer 73 (*)     GFR calc Af Amer 85 (*)     All other components within normal limits  POCT I-STAT TROPONIN I  D-DIMER, QUANTITATIVE  POCT I-STAT TROPONIN I   Dg Chest 2 View  07/04/2012  *RADIOLOGY REPORT*  Clinical Data: Headache.  Jaw pain.  Dizziness.  CHEST - 2 VIEW  Comparison: Two-view chest x-ray 01/21/2012, 01/26/2009.  Findings: Cardiac silhouette upper normal in size to slightly enlarged for the AP technique, unchanged.  Thoracic aorta mildly atherosclerotic, unchanged.  Hilar and mediastinal contours otherwise unremarkable.  Lungs clear.  Bronchovascular markings normal.  Pulmonary vascularity normal.  No pneumothorax.  No pleural effusions.  Degenerative changes involving the thoracic spine.  No significant interval change.  IMPRESSION: Borderline to mild cardiomegaly.  No acute cardiopulmonary disease. Stable examination.  Original Report Authenticated By: Arnell Sieving, M.D.     1. Neck pain      Date: 07/04/2012  Rate: 104  Rhythm: normal sinus rhythm  QRS Axis: normal  Intervals: normal  ST/T Wave abnormalities: normal  Conduction Disutrbances:none  Narrative Interpretation:   Old EKG Reviewed: none available    MDM          Benny Lennert, MD 07/04/12 [phone]

## 2012-07-04 NOTE — ED Notes (Signed)
Patient transported to X-ray 

## 2012-07-04 NOTE — ED Provider Notes (Signed)
MSE was initiated and I personally evaluated the patient and placed orders (if any) at  2:32 PM on July 04, 2012.  The remainder of the MSE may be completed by another ED provider.  Pt with right sided jaw and ear pain with radiation down to right shoulder.  Pt also expresses chest heaviness intermittently and feeling of fatigue and near syncope.  Symptoms have been ongoing since yesterday.  Ethelda Chick, MD 07/04/12 1434

## 2012-08-23 ENCOUNTER — Emergency Department (HOSPITAL_COMMUNITY)
Admission: EM | Admit: 2012-08-23 | Discharge: 2012-08-23 | Disposition: A | Discharge status: Home / Self Care | Payer: Medicare Other | Attending: Emergency Medicine | Admitting: Emergency Medicine

## 2012-08-23 ENCOUNTER — Encounter (HOSPITAL_COMMUNITY): Payer: Self-pay | Admitting: Emergency Medicine

## 2012-08-23 DIAGNOSIS — R21 Rash and other nonspecific skin eruption: Secondary | ICD-10-CM | POA: Insufficient documentation

## 2012-08-23 DIAGNOSIS — Z882 Allergy status to sulfonamides status: Secondary | ICD-10-CM | POA: Insufficient documentation

## 2012-08-23 DIAGNOSIS — D696 Thrombocytopenia, unspecified: Secondary | ICD-10-CM | POA: Insufficient documentation

## 2012-08-23 DIAGNOSIS — Z883 Allergy status to other anti-infective agents status: Secondary | ICD-10-CM | POA: Insufficient documentation

## 2012-08-23 DIAGNOSIS — Z880 Allergy status to penicillin: Secondary | ICD-10-CM | POA: Insufficient documentation

## 2012-08-23 DIAGNOSIS — Z9109 Other allergy status, other than to drugs and biological substances: Secondary | ICD-10-CM | POA: Insufficient documentation

## 2012-08-23 DIAGNOSIS — Z79899 Other long term (current) drug therapy: Secondary | ICD-10-CM | POA: Insufficient documentation

## 2012-08-23 DIAGNOSIS — I10 Essential (primary) hypertension: Secondary | ICD-10-CM | POA: Insufficient documentation

## 2012-08-23 DIAGNOSIS — Z886 Allergy status to analgesic agent status: Secondary | ICD-10-CM | POA: Insufficient documentation

## 2012-08-23 DIAGNOSIS — Z885 Allergy status to narcotic agent status: Secondary | ICD-10-CM | POA: Insufficient documentation

## 2012-08-23 DIAGNOSIS — Z91041 Radiographic dye allergy status: Secondary | ICD-10-CM | POA: Insufficient documentation

## 2012-08-23 DIAGNOSIS — T7840XA Allergy, unspecified, initial encounter: Secondary | ICD-10-CM | POA: Insufficient documentation

## 2012-08-23 HISTORY — DX: Thrombocytopenia, unspecified: D69.6

## 2012-08-23 HISTORY — DX: Anemia, unspecified: D64.9

## 2012-08-23 MED ORDER — PREDNISONE 20 MG PO TABS: ORAL_TABLET | ORAL | Status: AC

## 2012-08-23 MED ORDER — PREDNISONE 20 MG PO TABS
60.0000 mg | ORAL_TABLET | Freq: Once | ORAL | Status: AC
  Administered 2012-08-23: 60 mg via ORAL
  Filled 2012-08-23: qty 3

## 2012-08-23 MED ORDER — DIPHENHYDRAMINE HCL 25 MG PO CAPS
25.0000 mg | ORAL_CAPSULE | Freq: Once | ORAL | Status: AC
  Administered 2012-08-23: 25 mg via ORAL
  Filled 2012-08-23: qty 1

## 2012-08-23 MED ORDER — DIPHENHYDRAMINE HCL 25 MG PO TABS: 25.0000 mg | ORAL_TABLET | Freq: Four times a day (QID) | ORAL | Status: DC | PRN

## 2012-08-23 MED ORDER — FAMOTIDINE 20 MG PO TABS
20.0000 mg | ORAL_TABLET | Freq: Once | ORAL | Status: AC
  Administered 2012-08-23: 20 mg via ORAL
  Filled 2012-08-23: qty 1

## 2012-08-23 NOTE — ED Notes (Signed)
Pt c/o rash starting last Thursday and sts feels like her throat is closing this am; pt speaking complete sentences; pt sts took benadryl this am; pt sts mouth is dry

## 2012-08-23 NOTE — ED Provider Notes (Signed)
History     CSN: [id number]  Arrival date & time 08/23/12  0910   First MD Initiated Contact with Patient 08/23/12 [phone]      Chief Complaint  Patient presents with  . Allergic Reaction    (Consider location/radiation/quality/duration/timing/severity/associated sxs/prior treatment) Patient is a 71 y.o. female presenting with allergic reaction. The history is provided by the patient.  Allergic Reaction The primary symptoms are  rash. The primary symptoms do not include shortness of breath, abdominal pain, vomiting or diarrhea.  Significant symptoms that are not present include eye redness.  pt c/o itching rash to trunk and extremities present for past 3 days. Symptoms constant. Denies any recent new medication use. No change in any home or personal products. States has tried some new foods and smoothies recently, hx several food allergies in past. No hands/feet or palms/soles. No mucous membrane involvement. No tongue swelling. No throat swelling or trouble breathing or swallowing. No recent febrile or viral illness. No cough or wheezing. No gu c/o. No abd pain. No nvd.      Past Medical History  Diagnosis Date  . Hypertension   . Back pain   . Anemia   . Thrombocytopenia     History reviewed. No pertinent past surgical history.  History reviewed. No pertinent family history.  History  Substance Use Topics  . Smoking status: Never Smoker   . Smokeless tobacco: Not on file  . Alcohol Use: No    OB History    Grav Para Term Preterm Abortions TAB SAB Ect Mult Living                  Review of Systems  Constitutional: Negative for fever and chills.  HENT: Negative for sore throat, mouth sores, neck pain and neck stiffness.   Eyes: Negative for discharge and redness.  Respiratory: Negative for shortness of breath.   Cardiovascular: Negative for chest pain.  Gastrointestinal: Negative for vomiting, abdominal pain and diarrhea.  Genitourinary: Negative for dysuria and  flank pain.  Musculoskeletal: Negative for back pain.  Skin: Positive for rash.  Neurological: Negative for headaches.  Hematological: Does not bruise/bleed easily.  Psychiatric/Behavioral: Negative for confusion.    Allergies  Codeine; Latex; Penicillins; Barbiturates; Celebrex; Ivp dye; Lanolin; Mobic; Morphine and related; Nutritional supplements; Petroleum jelly; Promethazine-dm; Shellfish allergy; Tape; Tylox; Nitrates, organic; and Sulfa antibiotics  Home Medications   Current Outpatient Rx  Name Route Sig Dispense Refill  . ALPRAZOLAM 0.25 MG PO TABS Oral Take 0.25 mg by mouth 3 (three) times daily as needed. For anxiety    . DIPHENHYDRAMINE HCL 25 MG PO TABS Oral Take 25 mg by mouth every 6 (six) hours as needed. For rash    . DOCUSATE SODIUM 100 MG PO CAPS Oral Take 100 mg by mouth 2 (two) times daily.    . IBUPROFEN 800 MG PO TABS Oral Take 800 mg by mouth every 8 (eight) hours as needed. pain    . OLMESARTAN MEDOXOMIL 40 MG PO TABS Oral Take 40 mg by mouth daily.    [name] OMEPRAZOLE 20 MG PO CPDR Oral Take 20 mg by mouth daily.    . TRAMADOL HCL 50 MG PO TABS Oral Take 50-100 mg by mouth every 6 (six) hours as needed. For pain      BP 186/87  Pulse 115  Temp 97.6 F (36.4 C) (Oral)  Resp 20  SpO2 100%  Physical Exam  Nursing note and vitals reviewed. Constitutional: She is oriented to  person, place, and time. She appears well-developed and well-nourished. No distress.  HENT:  Nose: Nose normal.  Mouth/Throat: Oropharynx is clear and moist.       No mm lesions/rash.   Eyes: Conjunctivae are normal. No scleral icterus.  Neck: Normal range of motion. Neck supple. No tracheal deviation present.  Cardiovascular: Normal rate, regular rhythm, normal heart sounds and intact distal pulses.   Pulmonary/Chest: Effort normal and breath sounds normal. No respiratory distress. She has no wheezes.  Abdominal: Soft. Normal appearance and bowel sounds are normal. She exhibits no  distension. There is tenderness.  Musculoskeletal: She exhibits no edema and no tenderness.  Neurological: She is alert and oriented to person, place, and time.  Skin: Skin is warm and dry. Rash noted.       Erythematous, sparse urticarial type lesions to trunk and extremities. No palms or soles. No mm involvement. No target or iris lesions. No petechia or purpuric lesions.   Psychiatric: She has a normal mood and affect.    ED Course  Procedures (including critical care time)     MDM  Pt denies any new med or personal/home product use. Is noted to have hx multiple allergies, including multiple food allergies. Does not recently drink several smoothies - pt unsure if related to symptoms.   pred po. Benadryl po. pepcid po.    Recheck symptoms improved. Will give rx for home, allergist follow up.     Suzi Roots, MD 08/23/12 [phone]

## 2012-08-25 ENCOUNTER — Other Ambulatory Visit: Payer: Self-pay | Admitting: Internal Medicine

## 2012-08-25 ENCOUNTER — Other Ambulatory Visit: Payer: Medicare Other

## 2012-08-25 ENCOUNTER — Ambulatory Visit (INDEPENDENT_AMBULATORY_CARE_PROVIDER_SITE_OTHER): Payer: Medicare Other | Admitting: Internal Medicine

## 2012-08-25 ENCOUNTER — Encounter: Payer: Self-pay | Admitting: Internal Medicine

## 2012-08-25 VITALS — BP 150/78 | HR 66 | Ht 64.0 in | Wt 147.0 lb

## 2012-08-25 DIAGNOSIS — L509 Urticaria, unspecified: Secondary | ICD-10-CM

## 2012-08-25 DIAGNOSIS — L500 Allergic urticaria: Secondary | ICD-10-CM

## 2012-08-25 NOTE — Progress Notes (Signed)
08/25/12- [name] never smoker -Self referral-was at beach and started having whelps and rash with itching-unknown cause-sent by ER for consult; had peaches and kale(this is the only unusual items she had)(spouse had eaten shrimp around her and she is aware that she is allergic to that); Still having out breaks at this time. She has a history of allergic rhinitis going back many years and has been on allergy vaccine at 2 different times. Also has a history of urticaria from eating shrimp. For 5 days prior to this visit she had onset of hives on her shoulders. Lesions gradually spread she began Benadryl. Throat felt tight so she went to emergency room on 08/23/2012. Her husband and daughter sitting next to her had eaten shrimp. She ate peaches a few hours before onset of the hives. Despite prednisone taper and Benadryl from ER, some urticarial lesions have continued. She has not been wheezing. She denies history of liver or thyroid disease. She has had a past intolerance to latex, contrast dye and aspirin. She had not felt acutely ill prior onset of the hives.  Prior to Admission medications   Medication Sig Start Date End Date Taking? Authorizing Provider  ALPRAZolam (XANAX) 0.25 MG tablet Take 0.25 mg by mouth 3 (three) times daily as needed. For anxiety   Yes Historical Provider, MD  diphenhydrAMINE (BENADRYL) 25 MG tablet Take 1 tablet (25 mg total) by mouth every 6 (six) hours as needed for itching. 08/23/12 09/22/12 Yes Suzi Roots, MD  docusate sodium (COLACE) 100 MG capsule Take 100 mg by mouth 2 (two) times daily.   Yes Historical Provider, MD  olmesartan (BENICAR) 40 MG tablet Take 40 mg by mouth daily.   Yes Historical Provider, MD  predniSONE (DELTASONE) 20 MG tablet 3 po once a day for 2 days, then 2 po once a day for 3 days, then 1 po once a day for 3 days 08/23/12 09/02/12 Yes Suzi Roots, MD  traMADol (ULTRAM) 50 MG tablet Take 50-100 mg by mouth every 6 (six) hours as needed. For pain    Yes Historical Provider, MD   Past Medical History  Diagnosis Date  . Hypertension   . Back pain   . Anemia   . Thrombocytopenia    History reviewed. No pertinent past surgical history. History reviewed. No pertinent family history. History   Social History  . Marital Status: Married    Spouse Name: N/A    Number of Children: N/A  . Years of Education: N/A   Occupational History  . Not on file.   Social History Main Topics  . Smoking status: Never Smoker   . Smokeless tobacco: Not on file  . Alcohol Use: No  . Drug Use:   . Sexually Active:    Other Topics Concern  . Not on file   Social History Narrative  . No narrative on file   ROS-see HPI Constitutional:   No-   weight loss, night sweats, fevers, chills, fatigue, lassitude. HEENT:   No-  headaches, difficulty swallowing, tooth/dental problems, sore throat,       No-  sneezing, itching, ear ache, nasal congestion, post nasal drip,  CV:  No-   chest pain, orthopnea, PND, swelling in lower extremities, anasarca,  dizziness, palpitations Resp: No-   shortness of breath with exertion or at rest.              No-   productive cough,  No non-productive cough,  No- coughing up of  blood.              No-   change in color of mucus.  No- wheezing.   Skin: + HPI GI:  No-   heartburn, indigestion, abdominal pain, nausea, vomiting, diarrhea,                 change in bowel habits, loss of appetite GU: No-   dysuria, change in color of urine, no urgency or frequency.  No- flank pain. MS:  No-   joint pain or swelling.  No- decreased range of motion.  No- back pain. Neuro-     nothing unusual Psych:  No- change in mood or affect. No depression or anxiety.  No memory loss.  OBJ- Physical Exam General- Alert, Oriented, Affect-appropriate, Distress- none acute. Medium build Skin- + faint scattered areas of rash, more macular than papular, with some excoriation. Oral mucosa is clear. Lymphadenopathy- none Head- atraumatic             Eyes- Gross vision intact, PERRLA, conjunctivae and secretions clear            Ears- Hearing, canals-normal            Nose- Clear, no-Septal dev, mucus, polyps, erosion, perforation             Throat- Mallampati II , mucosa clear , drainage- none, tonsils- atrophic Neck- flexible , trachea midline, no stridor , thyroid nl, carotid no bruit Chest - symmetrical excursion , unlabored           Heart/CV- RRR , no murmur , no gallop  , no rub, nl s1 s2                           - JVD- none , edema- none, stasis changes- none, varices- none           Lung- clear to P&A, wheeze- none, cough- none , dullness-none, rub- none           Chest wall-  Abd- tender-no, distended-no, bowel sounds-present, HSM- no Br/ Gen/ Rectal- Not done, not indicated Extrem- cyanosis- none, clubbing, none, atrophy- none, strength- nl Neuro- grossly intact to observation

## 2012-08-25 NOTE — Patient Instructions (Addendum)
Order- allergy profile regular and Food IgE  Finish prednisone taper  Instead of Benadryl, try Allegra 180/ fexofenadine, one daily Add Pepcid 20 mg, 1 daily

## 2012-08-28 LAB — ALLERGY FULL AND FOOD SPECIFIC PROFILE
Allergen, D pternoyssinus,d7: 7.71 kU/L — ABNORMAL HIGH
Allergen,Goose feathers, e70: 0.11 kU/L — ABNORMAL HIGH
Apple: 6.03 kU/L — ABNORMAL HIGH
Bahia Grass: 10.8 kU/L — ABNORMAL HIGH
Box Elder IgE: 3.36 kU/L — ABNORMAL HIGH
Common Ragweed: 10 kU/L — ABNORMAL HIGH
D. farinae: 9.42 kU/L — ABNORMAL HIGH
Egg White IgE: 0.12 kU/L — ABNORMAL HIGH
G005 Rye, Perennial: 33.4 kU/L — ABNORMAL HIGH
House Dust Hollister: 1.18 kU/L — ABNORMAL HIGH
IgE (Immunoglobulin E), Serum: 2030.5 IU/mL — ABNORMAL HIGH (ref 0.0–180.0)
Oak: 25.5 kU/L — ABNORMAL HIGH
Orange: 0.62 kU/L — ABNORMAL HIGH
Shrimp IgE: 1.21 kU/L — ABNORMAL HIGH
Stemphylium Botryosum: 2.2 kU/L — ABNORMAL HIGH
Timothy Grass: 29.4 kU/L — ABNORMAL HIGH
Tomato IgE: 2.79 kU/L — ABNORMAL HIGH

## 2012-08-31 DIAGNOSIS — L500 Allergic urticaria: Secondary | ICD-10-CM | POA: Insufficient documentation

## 2012-08-31 NOTE — Assessment & Plan Note (Signed)
This may initially have been a shrimp triggered urticarial reaction but I would not expect that to last so long in the face of prednisone and antihistamines. Plan-finish prednisone taper. Change Benadryl to Allegra to stop sedation. Add Pepcid 20 mg. Lab for allergy profile

## 2012-09-01 NOTE — Progress Notes (Signed)
Quick Note:  LMTCB ______ 

## 2012-09-02 NOTE — Progress Notes (Signed)
Quick Note:  LMTCB ______ 

## 2012-09-02 NOTE — Progress Notes (Signed)
Quick Note:  Pt aware of results and will keep OV on 09-28-12 ______

## 2012-09-03 ENCOUNTER — Telehealth: Payer: Self-pay | Admitting: Internal Medicine

## 2012-09-03 NOTE — Telephone Encounter (Signed)
Per 08-25-12 ov w/ CY:  Urticaria due to food allergy Waymon Budge, MD  08/31/2012  7:42 PM  Signed  This may initially have been a shrimp triggered urticarial reaction but I would not expect that to last so long in the face of prednisone and antihistamines. Plan-finish prednisone taper. Change Benadryl to Allegra to stop sedation. Add Pepcid 20 mg. Lab for allergy profile  Patient Instructions     Order- allergy profile regular and Food IgE  Finish prednisone taper  Instead of Benadryl, try Allegra 180/ fexofenadine, one daily Add Pepcid 20 mg, 1 daily    ------------------------------------- Florentina Addison had spoke with patient regarding foods that she needs to be mindful of or avoid.  Called spoke with patient who remembered speaking to [facility] but is requesting a copy of her labs so that she can be mindful of which foods to avoid.  Per CY, advised pt that she does not have to avoid these foods but she needs to watch for a reaction when eating them and if she develops a reaction, then she should avoid them.  Allergy profile lab results printed for patient along with a letter further explaining which foods pt needs to be mindful of and these have been highlighted on her copy of the lab results.  This has been placed in to the mail to pt's verified home address.  Nothing further needed at this time; will sign off.

## 2012-09-28 ENCOUNTER — Ambulatory Visit (INDEPENDENT_AMBULATORY_CARE_PROVIDER_SITE_OTHER): Payer: Medicare Other | Admitting: Internal Medicine

## 2012-09-28 ENCOUNTER — Encounter: Payer: Self-pay | Admitting: Internal Medicine

## 2012-09-28 VITALS — BP 112/66 | HR 60 | Ht 64.0 in | Wt 147.2 lb

## 2012-09-28 DIAGNOSIS — D71 Functional disorders of polymorphonuclear neutrophils: Secondary | ICD-10-CM

## 2012-09-28 DIAGNOSIS — D824 Hyperimmunoglobulin E [IgE] syndrome: Secondary | ICD-10-CM

## 2012-09-28 DIAGNOSIS — L500 Allergic urticaria: Secondary | ICD-10-CM

## 2012-09-28 NOTE — Patient Instructions (Addendum)
Continue daily allegra/ fexofenadine 180 mg  Take pepcid 20 mg twice every day before meals  Try to eat simple foods so it is easy to tell which bother you, until you know which are safe.That will make keeping a food diary easier.

## 2012-09-28 NOTE — Progress Notes (Signed)
08/25/12- [name] never smoker -Self referral-was at beach and started having whelps and rash with itching-unknown cause-sent by ER for consult; had peaches and kale(this is the only unusual items she had)(spouse had eaten shrimp around her and she is aware that she is allergic to that); Still having out breaks at this time. She has a history of allergic rhinitis going back many years and has been on allergy vaccine at 2 different times. Also has a history of urticaria from eating shrimp. For 5 days prior to this visit she had onset of hives on her shoulders. Lesions gradually spread she began Benadryl. Throat felt tight so she went to emergency room on 08/23/2012. Her husband and daughter sitting next to her had eaten shrimp. She ate peaches a few hours before onset of the hives. Despite prednisone taper and Benadryl from ER, some urticarial lesions have continued. She has not been wheezing. She denies history of liver or thyroid disease. She has had a past intolerance to latex, contrast dye and aspirin. She had not felt acutely ill prior onset of the hives.  09/28/12- [name] never smoker followed for urticaria/ food allergy/ shrimp     husband here Allergy profile- 08/25/2012-elevated IgE 2030.5 with secondary elevation of almost all foods and inhalant allergens on the panel. Hives have been much better but not quite gone. Minor hayfever in recent years treated with daily Allegra. She added Pepcid 20 mg. This also helps heartburn/acid indigestion. Denies wheezing or cough.  ROS-see HPI Constitutional:   No-   weight loss, night sweats, fevers, chills, fatigue, lassitude. HEENT:   No-  headaches, difficulty swallowing, tooth/dental problems, sore throat,       + sneezing, itching, ear ache, nasal congestion, post nasal drip,  CV:  No-   chest pain, orthopnea, PND, swelling in lower extremities, anasarca,  dizziness, palpitations Resp: No-   shortness of breath with exertion or at rest.              No-    productive cough,  No non-productive cough,  No- coughing up of blood.              No-   change in color of mucus.  No- wheezing.   Skin: + HPI GI:  +  heartburn, indigestion, abdominal pain, nausea, vomiting,  GU:  MS:  No-   joint pain or swelling.   Neuro-     nothing unusual Psych:  No- change in mood or affect. No depression or anxiety.  No memory loss.  OBJ- Physical Exam General- Alert, Oriented, Affect-appropriate, Distress- none acute. Medium build Skin- + faint hyperpigmented areas where she had hives. Oral mucosa is clear. Lymphadenopathy- none Head- atraumatic            Eyes- Gross vision intact, PERRLA, conjunctivae and secretions clear            Ears- Hearing, canals-normal            Nose- Clear, no-Septal dev, mucus, polyps, erosion, perforation             Throat- Mallampati II , mucosa clear , drainage- none, tonsils- atrophic Neck- flexible , trachea midline, no stridor , thyroid nl, carotid no bruit Chest - symmetrical excursion , unlabored           Heart/CV- RRR , no murmur , no gallop  , no rub, nl s1 s2                           -  JVD- none , edema- none, stasis changes- none, varices- none           Lung- clear to P&A, wheeze- none, cough- none , dullness-none, rub- none           Chest wall-  Abd-  Br/ Gen/ Rectal- Not done, not indicated Extrem- cyanosis- none, clubbing, none, atrophy- none, strength- nl Neuro- grossly intact to observation     

## 2012-10-05 DIAGNOSIS — D824 Hyperimmunoglobulin E [IgE] syndrome: Secondary | ICD-10-CM | POA: Insufficient documentation

## 2012-10-05 NOTE — Assessment & Plan Note (Signed)
Allergy panel was positive for almost everything, making specific therapy difficult. I don't think allergy skin testing help yet. I discussed, and considerations of this syndrome. Plan-continue maintenance low-dose prednisone. Increase Pepcid (for antihistamine role) to twice daily

## 2012-10-05 NOTE — Assessment & Plan Note (Signed)
Food diary. Avoid those foods which cause symptoms. Be aware of the broad elevations against common foods and environmental factors, but this is not the same as "allergy" unless symptoms result.

## 2012-12-29 ENCOUNTER — Other Ambulatory Visit (INDEPENDENT_AMBULATORY_CARE_PROVIDER_SITE_OTHER): Payer: Medicare Other

## 2012-12-29 ENCOUNTER — Encounter: Payer: Self-pay | Admitting: Internal Medicine

## 2012-12-29 ENCOUNTER — Ambulatory Visit (INDEPENDENT_AMBULATORY_CARE_PROVIDER_SITE_OTHER): Payer: Medicare Other | Admitting: Internal Medicine

## 2012-12-29 VITALS — BP 124/72 | HR 56 | Ht 64.0 in | Wt 143.0 lb

## 2012-12-29 DIAGNOSIS — L500 Allergic urticaria: Secondary | ICD-10-CM

## 2012-12-29 DIAGNOSIS — J309 Allergic rhinitis, unspecified: Secondary | ICD-10-CM

## 2012-12-29 DIAGNOSIS — L509 Urticaria, unspecified: Secondary | ICD-10-CM

## 2012-12-29 LAB — COMPREHENSIVE METABOLIC PANEL
Albumin: 4 g/dL (ref 3.5–5.2)
BUN: 11 mg/dL (ref 6–23)
CO2: 27 mEq/L (ref 19–32)
Calcium: 9.4 mg/dL (ref 8.4–10.5)
Chloride: 105 mEq/L (ref 96–112)
Creatinine, Ser: 0.9 mg/dL (ref 0.4–1.2)
GFR: 84.72 mL/min (ref >60.00)
Glucose, Bld: 88 mg/dL (ref 70–99)

## 2012-12-29 LAB — CBC WITH DIFFERENTIAL/PLATELET
Basophils Relative: 0.5 % (ref 0.0–3.0)
Eosinophils Relative: 0.4 % (ref 0.0–5.0)
Hemoglobin: 12 g/dL (ref 12.0–15.0)
Lymphocytes Relative: 30.4 % (ref 12.0–46.0)
MCHC: 31.7 g/dL (ref 30.0–36.0)
Monocytes Relative: 8.9 % (ref 3.0–12.0)
Neutro Abs: 2.7 10*3/uL (ref 1.4–7.7)
RBC: 5.21 Mil/uL — ABNORMAL HIGH (ref 3.87–5.11)
WBC: 4.5 10*3/uL (ref 4.5–10.5)

## 2012-12-29 LAB — SEDIMENTATION RATE: Sed Rate: 28 mm/hr — ABNORMAL HIGH (ref 0–22)

## 2012-12-29 NOTE — Progress Notes (Signed)
08/25/12- [name] never smoker -Self referral-was at beach and started having whelps and rash with itching-unknown cause-sent by ER for consult; had peaches and kale(this is the only unusual items she had)(spouse had eaten shrimp around her and she is aware that she is allergic to that); Still having out breaks at this time. She has a history of allergic rhinitis going back many years and has been on allergy vaccine at 2 different times. Also has a history of urticaria from eating shrimp. For 5 days prior to this visit she had onset of hives on her shoulders. Lesions gradually spread she began Benadryl. Throat felt tight so she went to emergency room on 08/23/2012. Her husband and daughter sitting next to her had eaten shrimp. She ate peaches a few hours before onset of the hives. Despite prednisone taper and Benadryl from ER, some urticarial lesions have continued. She has not been wheezing. She denies history of liver or thyroid disease. She has had a past intolerance to latex, contrast dye and aspirin. She had not felt acutely ill prior onset of the hives.  09/28/12- [name] never smoker followed for urticaria/ food allergy/ shrimp     husband here Allergy profile- 08/25/2012-elevated IgE 2030.5 with secondary elevation of almost all foods and inhalant allergens on the panel. Hives have been much better but not quite gone. Minor hayfever in recent years treated with daily Allegra. She added Pepcid 20 mg. This also helps heartburn/acid indigestion. Denies wheezing or cough.  12/29/12-  [name] never smoker followed for urticaria/ food allergy/ shrimp/ peaches     Husband here FOLLOWS FOR: has had some itching. ate pineapple the other day and her throat started burning. On December 24 after eating "Vegan cakes" , nondairy, she got a pruritic rash on her hand. She suspects itching and rash related to poor, flavoring, fennell, brussels sprouts. Pineapple burned her throat. She is trying to keep a food diary.  She continues Allegra and Pepcid. She thinks she is newly sensitive to a beauty parlor color rinse for her hair. [name] ROS-see HPI Constitutional:   No-   weight loss, night sweats, fevers, chills, fatigue, lassitude. HEENT:   No-  headaches, difficulty swallowing, tooth/dental problems, sore throat,       No- sneezing, +itching, no-ear ache, nasal congestion, post nasal drip,  CV:  No-   chest pain, orthopnea, PND, swelling in lower extremities, anasarca,  dizziness, palpitations Resp: No-   shortness of breath with exertion or at rest.              No-   productive cough,  No non-productive cough,  No- coughing up of blood.              No-   change in color of mucus.  No- wheezing.   Skin: + HPI GI:  +  heartburn, indigestion, abdominal pain, nausea, vomiting,  GU:  MS:  No-   joint pain or swelling.   Neuro-     nothing unusual Psych:  No- change in mood or affect. No depression or anxiety.  No memory loss.  OBJ- Physical Exam General- Alert, Oriented, Affect-appropriate, Distress- none acute. Medium build Skin- + excoriated areas on back and buttocks but otherwise no apparent rash. Oral mucosa is clear. Lymphadenopathy- none Head- atraumatic            Eyes- Gross vision intact, PERRLA, conjunctivae and secretions clear            Ears- Hearing, canals-normal  Nose- Clear, no-Septal dev, mucus, polyps, erosion, perforation             Throat- Mallampati II , mucosa clear , drainage- none, tonsils- atrophic Neck- flexible , trachea midline, no stridor , thyroid nl, carotid no bruit Chest - symmetrical excursion , unlabored           Heart/CV- RRR , no murmur , no gallop  , no rub, nl s1 s2                           - JVD- none , edema- none, stasis changes- none, varices- none           Lung- clear to P&A, wheeze- none, cough- none , dullness-none, rub- none           Chest wall-  Abd-  Br/ Gen/ Rectal- Not done, not indicated Extrem- cyanosis- none, clubbing, none,  atrophy- none, strength- nl Neuro- grossly intact to observation

## 2012-12-29 NOTE — Patient Instructions (Addendum)
Order- CBC, CMET, total IgE, ANA, Sed rate,   IgE to alpha-gal       Dx urticaria, allergic rhinitis  Your food diary is useful.  Continue to avoid the foods that you clearly find cause you trouble.   It is probably worth while to use the allegra and pepcid for prevention

## 2012-12-30 LAB — ANA: Anti Nuclear Antibody(ANA): NEGATIVE

## 2013-01-09 NOTE — Assessment & Plan Note (Signed)
As would be consistent with urticaria or atopic dermatitis without angioedema or wheezing. Plan-continue antihistamines. Continue to avoid foods that cause symptoms. Continue food diary. We will broaden lab work looking for evidence of underlying disease and also looking for evidence of allergy to meat.

## 2013-01-11 ENCOUNTER — Telehealth: Payer: Self-pay | Admitting: Internal Medicine

## 2013-01-11 NOTE — Progress Notes (Signed)
Quick Note:  Spoke with pt. She is aware of lab results per Dr. Maple Hudson. She verbalized understanding and voiced no further questions or concerns at this time. ______

## 2013-01-11 NOTE — Telephone Encounter (Signed)
Notes Recorded by Waymon Budge, MD on 01/08/2013 at 5:17 PM Total allergy antibody level is still high, but a little better than last time. Other labs are ok. We will discuss at next ov.   -----  Called, spoke with pt.  Informed her of lab results as stated by Dr. Maple Hudson.  She verbalized understanding of this. Requesting results to be faxed to Dr. Laurann Montana and voiced no further questions or concerns at this time.  Results printed and faxed to Dr. Lucilla Lame office.  Pt aware.

## 2013-02-11 ENCOUNTER — Ambulatory Visit: Payer: Medicare Other | Admitting: Internal Medicine

## 2013-02-12 ENCOUNTER — Emergency Department (HOSPITAL_COMMUNITY)
Admission: EM | Admit: 2013-02-12 | Discharge: 2013-02-12 | Disposition: A | Discharge status: Home / Self Care | Payer: Medicare Other | Attending: Emergency Medicine | Admitting: Emergency Medicine

## 2013-02-12 ENCOUNTER — Encounter (HOSPITAL_COMMUNITY): Payer: Self-pay

## 2013-02-12 ENCOUNTER — Other Ambulatory Visit: Payer: Self-pay

## 2013-02-12 DIAGNOSIS — Z79899 Other long term (current) drug therapy: Secondary | ICD-10-CM | POA: Insufficient documentation

## 2013-02-12 DIAGNOSIS — R209 Unspecified disturbances of skin sensation: Secondary | ICD-10-CM | POA: Insufficient documentation

## 2013-02-12 DIAGNOSIS — Z8739 Personal history of other diseases of the musculoskeletal system and connective tissue: Secondary | ICD-10-CM | POA: Insufficient documentation

## 2013-02-12 DIAGNOSIS — Z7982 Long term (current) use of aspirin: Secondary | ICD-10-CM | POA: Insufficient documentation

## 2013-02-12 DIAGNOSIS — R002 Palpitations: Secondary | ICD-10-CM | POA: Insufficient documentation

## 2013-02-12 DIAGNOSIS — Z862 Personal history of diseases of the blood and blood-forming organs and certain disorders involving the immune mechanism: Secondary | ICD-10-CM | POA: Insufficient documentation

## 2013-02-12 DIAGNOSIS — I10 Essential (primary) hypertension: Secondary | ICD-10-CM | POA: Insufficient documentation

## 2013-02-12 LAB — CBC WITH DIFFERENTIAL/PLATELET
Basophils Absolute: 0 10*3/uL (ref 0.0–0.1)
Basophils Relative: 1 % (ref 0–1)
Eosinophils Absolute: 0 10*3/uL (ref 0.0–0.7)
Eosinophils Relative: 1 % (ref 0–5)
HCT: 36.9 % (ref 36.0–46.0)
Hemoglobin: 11.8 g/dL — ABNORMAL LOW (ref 12.0–15.0)
Lymphocytes Relative: 23 % (ref 12–46)
Lymphs Abs: 0.9 10*3/uL (ref 0.7–4.0)
MCH: 22.8 pg — ABNORMAL LOW (ref 26.0–34.0)
MCHC: 32 g/dL (ref 30.0–36.0)
MCV: 71.4 fL — ABNORMAL LOW (ref 78.0–100.0)
Monocytes Absolute: 0.4 10*3/uL (ref 0.1–1.0)
Monocytes Relative: 9 % (ref 3–12)
Neutro Abs: 2.6 10*3/uL (ref 1.7–7.7)
Neutrophils Relative %: 66 % (ref 43–77)
Platelets: 140 10*3/uL — ABNORMAL LOW (ref 150–400)
RBC: 5.17 MIL/uL — ABNORMAL HIGH (ref 3.87–5.11)
RDW: 14.9 % (ref 11.5–15.5)
WBC: 3.9 10*3/uL — ABNORMAL LOW (ref 4.0–10.5)

## 2013-02-12 LAB — BASIC METABOLIC PANEL
BUN: 12 mg/dL (ref 6–23)
CO2: 22 mEq/L (ref 19–32)
Calcium: 9.3 mg/dL (ref 8.4–10.5)
Chloride: 109 mEq/L (ref 96–112)
Creatinine, Ser: 0.77 mg/dL (ref 0.50–1.10)
GFR calc Af Amer: >90 mL/min (ref >90)
GFR calc non Af Amer: 83 mL/min — ABNORMAL LOW (ref >90)
Glucose, Bld: 129 mg/dL — ABNORMAL HIGH (ref 70–99)
Potassium: 3.9 mEq/L (ref 3.5–5.1)
Sodium: 142 mEq/L (ref 135–145)

## 2013-02-12 MED ORDER — LORAZEPAM 2 MG/ML IJ SOLN
1.0000 mg | Freq: Once | INTRAMUSCULAR | Status: AC
  Administered 2013-02-12: 1 mg via INTRAVENOUS
  Filled 2013-02-12: qty 1

## 2013-02-12 MED ORDER — SODIUM CHLORIDE 0.9 % IV BOLUS (SEPSIS)
1000.0000 mL | Freq: Once | INTRAVENOUS | Status: AC
  Administered 2013-02-12: 1000 mL via INTRAVENOUS

## 2013-02-12 NOTE — ED Notes (Signed)
Pt. Stated they symptoms began last night. Ears are ringing. High bp readings at home, legs and arms are burning.  Denies any chest pain.  Pt. Is anxious.

## 2013-02-12 NOTE — ED Notes (Signed)
Phlebotomy contacted to come redraw labs.

## 2013-02-12 NOTE — ED Notes (Signed)
Denies pain currently. Pt alert and mentating appropriately. Pt states that she still feels numbness in legs and feet. Pt states that she feels like her pressure is better.

## 2013-02-12 NOTE — ED Notes (Signed)
Report taken from [facility], RN

## 2013-02-12 NOTE — ED Provider Notes (Signed)
History    72 year old female with multiple complaints, of which the chief complaint seems to be palpitations. Symptom onset last night. Patient reports intermittent fluttering in her chest. Lasts seconds. No appreciable exacerbating relieving factors. Patient is chest pain or shortness of breath. She's complaining of reassuring. She also complaining that her arms and legs were intermittently burning. Patient has been repeatedly checking her blood pressure at home feeling that her symptoms may be a triple to her hypertension. She reports that her blood pressures ran to the 170s to 210 systolic. Patient has a past history of hypertension. She reports compliance to medications. No recent medication changes. No headaches. No unusual swelling. No urinary complaints.  CSN: [id number]  Arrival date & time 02/12/13  0807   First MD Initiated Contact with Patient 02/12/13 0820      Chief Complaint  Patient presents with  . Hypertension    (Consider location/radiation/quality/duration/timing/severity/associated sxs/prior treatment) HPI  Past Medical History  Diagnosis Date  . Hypertension   . Back pain   . Anemia   . Thrombocytopenia     History reviewed. No pertinent past surgical history.  No family history on file.  History  Substance Use Topics  . Smoking status: Never Smoker   . Smokeless tobacco: Not on file  . Alcohol Use: No    OB History   Grav Para Term Preterm Abortions TAB SAB Ect Mult Living                  Review of Systems  All systems reviewed and negative, other than as noted in HPI.   Allergies  Codeine; Latex; Penicillins; Barbiturates; Bee pollen; Beef-derived products; Celebrex; Diovan; Epinephrine; Ivp dye; Lanolin; Mobic; Morphine and related; Nutritional supplements; Other; Oxycodone; Petroleum jelly; Promethazine-dm; Shellfish allergy; Soy allergy; Strawberry; Tape; Tylox; Nitrates, organic; and Sulfa antibiotics  Home Medications   Current  Outpatient Rx  Name  Route  Sig  Dispense  Refill  . ALPRAZolam (XANAX) 0.25 MG tablet   Oral   Take 0.25 mg by mouth 3 (three) times daily as needed. For anxiety         . amLODipine (NORVASC) 2.5 MG tablet   Oral   Take 2.5 mg by mouth daily.         [name] aspirin EC 81 MG tablet   Oral   Take 81 mg by mouth daily.         . famotidine (PEPCID) 20 MG tablet   Oral   Take 20 mg by mouth as needed. For indigestion         . fexofenadine (ALLEGRA) 180 MG tablet   Oral   Take 180 mg by mouth daily as needed.         [name] olmesartan (BENICAR) 40 MG tablet   Oral   Take 40 mg by mouth daily.         . traMADol (ULTRAM) 50 MG tablet   Oral   Take 50-100 mg by mouth every 6 (six) hours as needed. For pain           BP 188/92  Pulse 130  Temp(Src) 97.8 F (36.6 C) (Oral)  Resp 24  SpO2 100%  Physical Exam  Nursing note and vitals reviewed. Constitutional: She appears well-developed and well-nourished. No distress.  HENT:  Head: Normocephalic and atraumatic.  Eyes: Conjunctivae are normal. Right eye exhibits no discharge. Left eye exhibits no discharge.  Neck: Neck supple.  Cardiovascular: Normal rate, regular rhythm and normal  heart sounds.  Exam reveals no gallop and no friction rub.   No murmur heard. Pulmonary/Chest: Effort normal and breath sounds normal. No respiratory distress.  Abdominal: Soft. She exhibits no distension. There is no tenderness.  Musculoskeletal: She exhibits no edema and no tenderness.  Lower extremities symmetric as compared to each other. No calf tenderness. Negative Homan's. No palpable cords.   Neurological: She is alert.  Skin: Skin is warm and dry.  Psychiatric: Her behavior is normal. Thought content normal.    ED Course  Procedures (including critical care time)  Labs Reviewed - No data to display No results found.   EKG:  Rhythm: sinus tachycardia, PVCs Rate: 119  Axis: left Intervals:  normal ST segments: NS ST  changes   1. Palpitations       MDM  72 year old female patient. EKG with some PVCs which may be the reason for patient's symptoms. Hypertensive but no evidence of acute end organ damage. Workup fairly unremarkable. The patient is safe for discharge at this time. Suspect there may be some component of anxiety.        Raeford Razor, MD 02/14/13 [phone]

## 2013-02-12 NOTE — ED Notes (Signed)
Pt taken to car via wheelchair and placed in car with husband. Pt leaving with d/c teaching. Pt given d/c teaching and follow up instructions. Pt verbalizes understanding of d/c teaching and has no further questions upon d/c. Pt alert and mentating appropriately upon d/c. Pt does not appear to be in acute distress upon d/c.

## 2013-02-14 ENCOUNTER — Emergency Department (HOSPITAL_COMMUNITY)
Admission: EM | Admit: 2013-02-14 | Discharge: 2013-02-14 | Disposition: A | Discharge status: Home Health | Payer: Medicare Other | Attending: Emergency Medicine | Admitting: Emergency Medicine

## 2013-02-14 ENCOUNTER — Encounter (HOSPITAL_COMMUNITY): Payer: Self-pay | Admitting: Nurse Practitioner

## 2013-02-14 DIAGNOSIS — I10 Essential (primary) hypertension: Secondary | ICD-10-CM | POA: Insufficient documentation

## 2013-02-14 DIAGNOSIS — Z79899 Other long term (current) drug therapy: Secondary | ICD-10-CM | POA: Insufficient documentation

## 2013-02-14 DIAGNOSIS — Z7982 Long term (current) use of aspirin: Secondary | ICD-10-CM | POA: Insufficient documentation

## 2013-02-14 DIAGNOSIS — Z862 Personal history of diseases of the blood and blood-forming organs and certain disorders involving the immune mechanism: Secondary | ICD-10-CM | POA: Insufficient documentation

## 2013-02-14 DIAGNOSIS — R079 Chest pain, unspecified: Secondary | ICD-10-CM

## 2013-02-14 DIAGNOSIS — Z8719 Personal history of other diseases of the digestive system: Secondary | ICD-10-CM | POA: Insufficient documentation

## 2013-02-14 LAB — BASIC METABOLIC PANEL
Calcium: 10 mg/dL (ref 8.4–10.5)
GFR calc Af Amer: >90 mL/min (ref >90)
GFR calc non Af Amer: 82 mL/min — ABNORMAL LOW (ref >90)
Glucose, Bld: 117 mg/dL — ABNORMAL HIGH (ref 70–99)
Potassium: 3.7 mEq/L (ref 3.5–5.1)
Sodium: 142 mEq/L (ref 135–145)

## 2013-02-14 LAB — POCT I-STAT TROPONIN I: Troponin i, poc: 0 ng/mL (ref 0.00–0.08)

## 2013-02-14 LAB — CBC
Hemoglobin: 12.8 g/dL (ref 12.0–15.0)
MCH: 22.7 pg — ABNORMAL LOW (ref 26.0–34.0)
MCHC: 31.7 g/dL (ref 30.0–36.0)
Platelets: 150 10*3/uL (ref 150–400)
RDW: 15.1 % (ref 11.5–15.5)

## 2013-02-14 MED ORDER — DIPHENHYDRAMINE HCL 50 MG/ML IJ SOLN
12.5000 mg | Freq: Once | INTRAMUSCULAR | Status: AC
  Administered 2013-02-14: 12.5 mg via INTRAVENOUS
  Filled 2013-02-14: qty 1

## 2013-02-14 MED ORDER — METOCLOPRAMIDE HCL 5 MG/ML IJ SOLN
10.0000 mg | Freq: Once | INTRAMUSCULAR | Status: AC
  Administered 2013-02-14: 10 mg via INTRAVENOUS
  Filled 2013-02-14: qty 2

## 2013-02-14 NOTE — ED Notes (Signed)
Pt discharged to home with family. NAD.  

## 2013-02-14 NOTE — ED Provider Notes (Signed)
History     CSN: [id number]  Arrival date & time 02/14/13  9147   First MD Initiated Contact with Patient 02/14/13 1013      Chief Complaint  Patient presents with  . Chest Pain    (Consider location/radiation/quality/duration/timing/severity/associated sxs/prior treatment) HPI 72 year old female presents to emergency department with chief complaint of chest pain, high blood pressure.  Patient was seen here at the emergency department 2 days ago with the same complaint.  This is something that happens frequently.  She has primary care with Dr. Laurann Montana.  She is also followed by Dr. could not get for her chest complaints.  Patient has a past medical history significant hypertension, multiple allergies, back pain.  She is on Xanax.  She states that yesterday her blood pressure was elevated to 200/111.  She states that she told her amlodipine and could not get her pressures down.  She also took a Xanax in the middle of the night to try to make herself go to sleep but was up.  She states she had racing heart.  Patient also states that she has crushing chest pain.  The patient had associated belching.  She does have a history of diagnosed GERD but states that she did not eat anything so therefore he could not be related to reflux.  The patient also states that she has a feeling like rubber bands around her feet.  She also complains that she has a left-sided headache and pain behind the left eye.  She denies visual changes, symptoms of TIA, weakness, difficulty with gait or speech.  She is nondiabetic she is a nonsmoker.  She has a grandmother who had a heart attack in her 12s.  No other risk factors for cardiac disease.  She has no history of DVT and denies any leg swelling unilaterally.  The patient denies a history of anxiety the patient presents with her blood pressure machine any neck notebook with month's worth of detailed daily living to include every food she is eating what she is watching on  television at the time and multiple blood pressure checks.   Past Medical History  Diagnosis Date  . Hypertension   . Back pain   . Anemia   . Thrombocytopenia     History reviewed. No pertinent past surgical history.  History reviewed. No pertinent family history.  History  Substance Use Topics  . Smoking status: Never Smoker   . Smokeless tobacco: Not on file  . Alcohol Use: No    OB History   Grav Para Term Preterm Abortions TAB SAB Ect Mult Living                  Review of Systems Ten systems reviewed and are negative for acute change, except as noted in the HPI.   Allergies  Codeine; Latex; Penicillins; Barbiturates; Bee pollen; Beef-derived products; Celebrex; Diovan; Epinephrine; Ivp dye; Lanolin; Mobic; Morphine and related; Nutritional supplements; Other; Oxycodone; Petroleum jelly; Promethazine-dm; Shellfish allergy; Soy allergy; Strawberry; Tape; Tylox; Nitrates, organic; and Sulfa antibiotics  Home Medications   Current Outpatient Rx  Name  Route  Sig  Dispense  Refill  . ALPRAZolam (XANAX) 0.25 MG tablet   Oral   Take 0.25 mg by mouth 3 (three) times daily as needed. For anxiety         . amLODipine (NORVASC) 2.5 MG tablet   Oral   Take 2.5 mg by mouth daily.         [name] aspirin  EC 81 MG tablet   Oral   Take 81 mg by mouth daily.         . famotidine (PEPCID) 20 MG tablet   Oral   Take 20 mg by mouth as needed. For indigestion         . fexofenadine (ALLEGRA) 180 MG tablet   Oral   Take 180 mg by mouth daily as needed.         [name] olmesartan (BENICAR) 40 MG tablet   Oral   Take 40 mg by mouth daily.         . traMADol (ULTRAM) 50 MG tablet   Oral   Take 50-100 mg by mouth every 6 (six) hours as needed. For pain           BP 158/90  Pulse 125  Temp(Src) 97.5 F (36.4 C) (Oral)  Resp 16  SpO2 100%  Physical Exam  Physical Exam  Nursing note and vitals reviewed. Constitutional: Eyes are closed.  He appears worried and  anxious.   HENT:  Head: Normocephalic and atraumatic.  Eyes: Conjunctivae normal and EOM are normal. Pupils are equal, round, and reactive to light. No scleral icterus.  Neck: Normal range of motion.  Cardiovascular: Normal rate, regular rhythm and normal heart sounds.  (Heart rate in the 90s on my examination) BP 168/90.  Exam reveals no gallop and no friction rub.   No murmur heard. Pulmonary/Chest: Effort normal and breath sounds normal. No respiratory distress.  Abdominal: Soft. Bowel sounds are normal. She exhibits no distension and no mass. There is no tenderness. There is no guarding.  Neurological: She is alert and oriented to person, place, and time. Speech is clear and goal oriented, follows commands Major Cranial nerves without deficit, no facial droop Normal strength in upper and lower extremities bilaterally including dorsiflexion and plantar flexion, strong and equal grip strength Sensation normal to light and sharp touch Moves extremities without ataxia, coordination intact Normal finger to nose and rapid alternating movements Neg romberg, no pronator drift Normal gait Normal heel-shin and balance Skin: Skin is warm and dry. She is not diaphoretic.    ED Course  Procedures (including critical care time)  Labs Reviewed  CBC - Abnormal; Notable for the following:    RBC 5.65 (*)    MCV 71.5 (*)    MCH 22.7 (*)    All other components within normal limits  BASIC METABOLIC PANEL  PRO B NATRIURETIC PEPTIDE  POCT I-STAT TROPONIN I   No results found.   Date: 02/14/2013  Rate: 114  Rhythm: normal sinus rhythm  QRS Axis: normal  Intervals: normal  ST/T Wave abnormalities: normal  Conduction Disutrbances:none  Narrative Interpretation:   Old EKG Reviewed: unchanged    No diagnosis found.    MDM  10:46 AM BP 158/90  Pulse 125  Temp(Src) 97.5 F (36.4 C) (Oral)  Resp 16  SpO2 100% Agent with complaint of chest pain and headache.  She has symptoms  involving multiple organ systems.  Patient has been seen several times for the same complaint.  I believe there is a component of anxiety.  We'll workup for acute coronary syndrome.      12:02 PM Patient is resting comoftably. ass sxs resolved and VSS. Will obtain delta trop and d/c to cardiology if negative.    2:32 PM  Filed Vitals:   02/14/13 1015 02/14/13 1100 02/14/13 1200 02/14/13 1300  BP: 158/90 140/77 109/65 92/48  Pulse:  67 62  62  Temp:      TempSrc:      Resp:  15 12 17   SpO2:  100% 98% 100%    Delta troponin negative. Patient is to be discharged with recommendation to follow up with PCP in regards to today's hospital visit. Chest pain is not likely of cardiac or pulmonary etiology d/t presentation, perc negative, VSS, no tracheal deviation, no JVD or new murmur, RRR, breath sounds equal bilaterally, EKG without acute abnormalities, negative troponin, and negative CXR. Pt has been advised start a PPI and return to the ED is CP becomes exertional, associated with diaphoresis or nausea, radiates to left jaw/arm, worsens or becomes concerning in any way. Pt appears reliable for follow up and is agreeable to discharge.   Case has been discussed with and seen by Dr. Lorenso Courier who agrees with the above plan to discharge.    Arthor Captain, PA-C 02/14/13 1434

## 2013-02-14 NOTE — ED Notes (Signed)
Pt reports htn per home monitor last night, taking BP meds as prescribed. This am began to have "crushing chest pain" and belching, recheck and BP was 200s systolic. A&Ox4, breathing easily

## 2013-02-14 NOTE — ED Provider Notes (Signed)
Medical screening examination/treatment/procedure(s) were performed by non-physician practitioner and as supervising physician I was immediately available for consultation/collaboration.  Tobin Chad, MD 02/14/13 [phone]

## 2013-02-15 ENCOUNTER — Ambulatory Visit: Payer: Medicare Other | Admitting: Internal Medicine

## 2013-02-26 ENCOUNTER — Encounter: Payer: Self-pay | Admitting: Internal Medicine

## 2013-02-26 ENCOUNTER — Ambulatory Visit (INDEPENDENT_AMBULATORY_CARE_PROVIDER_SITE_OTHER): Payer: Medicare Other | Admitting: Internal Medicine

## 2013-02-26 VITALS — BP 160/90 | HR 91 | Ht 64.0 in | Wt 134.2 lb

## 2013-02-26 DIAGNOSIS — D824 Hyperimmunoglobulin E [IgE] syndrome: Secondary | ICD-10-CM

## 2013-02-26 DIAGNOSIS — D71 Functional disorders of polymorphonuclear neutrophils: Secondary | ICD-10-CM

## 2013-02-26 DIAGNOSIS — G4733 Obstructive sleep apnea (adult) (pediatric): Secondary | ICD-10-CM

## 2013-02-26 DIAGNOSIS — L500 Allergic urticaria: Secondary | ICD-10-CM

## 2013-02-26 MED ORDER — FIRST-DUKES MOUTHWASH MT SUSP: OROMUCOSAL | Status: DC

## 2013-02-26 NOTE — Progress Notes (Signed)
08/25/12- [name] never smoker -Self referral-was at beach and started having whelps and rash with itching-unknown cause-sent by ER for consult; had peaches and kale(this is the only unusual items she had)(spouse had eaten shrimp around her and she is aware that she is allergic to that); Still having out breaks at this time. She has a history of allergic rhinitis going back many years and has been on allergy vaccine at 2 different times. Also has a history of urticaria from eating shrimp. For 5 days prior to this visit she had onset of hives on her shoulders. Lesions gradually spread she began Benadryl. Throat felt tight so she went to emergency room on 08/23/2012. Her husband and daughter sitting next to her had eaten shrimp. She ate peaches a few hours before onset of the hives. Despite prednisone taper and Benadryl from ER, some urticarial lesions have continued. She has not been wheezing. She denies history of liver or thyroid disease. She has had a past intolerance to latex, contrast dye and aspirin. She had not felt acutely ill prior onset of the hives.  09/28/12- [name] never smoker followed for urticaria/ food allergy/ shrimp     husband here Allergy profile- 08/25/2012-elevated IgE 2030.5 with secondary elevation of almost all foods and inhalant allergens on the panel. Hives have been much better but not quite gone. Minor hayfever in recent years treated with daily Allegra. She added Pepcid 20 mg. This also helps heartburn/acid indigestion. Denies wheezing or cough.  12/29/12-  67 yoF never smoker followed for urticaria/ food allergy/ shrimp/ peaches     Husband here FOLLOWS FOR: has had some itching. ate pineapple the other day and her throat started burning. On December 24 after eating "Vegan cakes" , nondairy, she got a pruritic rash on her hand. She suspects itching and rash related to poor, flavoring, fennell, brussels sprouts. Pineapple burned her throat. She is trying to keep a food diary.  She continues Allegra and Pepcid. She thinks she is newly sensitive to a beauty parlor color rinse for her hair.  02/26/13- [name] never smoker followed for urticaria/ food allergy/ shrimp/ peaches    ACUTE VISIT:edema in hands, face and glands in mouth; body gets hot and veins get large. No exposure out of the normal that could have caused this reaction.  Worked up for HBP w/ renal insuf. Complains veins in hands are swelling. ENT / Dr Suszanne Conners for tinnitus, dry mouth. Treated for acid reflux. Shows me pictures of unremarkable prominent veins on hands. Has notebook full of notations about her health. Episodes of waking startled with gag, pounding heart. Husband corroborates. They ? OSA. Says she burns under skin, diffusely, no rash .IgE 01/08/13- 1825.4.Specific allergen antibodies secondarily increased.  CBC and CMET ok, ANA neg, Sed rate 28. Alpha-gal Neg. [name] ROS-see HPI Constitutional:   No-   weight loss, night sweats, fevers, chills, fatigue, lassitude. HEENT:   No-  headaches, difficulty swallowing, tooth/dental problems, sore throat,       No- sneezing, +itching, no-ear ache, nasal congestion, post nasal drip,  CV:  No-   chest pain, orthopnea, PND, swelling in lower extremities, anasarca,  dizziness, palpitations Resp: No-   shortness of breath with exertion or at rest.              No-   productive cough,  No non-productive cough,  No- coughing up of blood.              No-   change  in color of mucus.  No- wheezing.   Skin: + HPI GI:  +  heartburn, indigestion, abdominal pain, nausea, vomiting,  GU:  MS:  No-   joint pain or swelling.   Neuro-     nothing unusual Psych:  No- change in mood or affect. No depression or anxiety.  No memory loss.  OBJ- Physical Exam BP 160/90  Pulse 91  Ht 5\' 4"  (1.626 m)  Wt 134 lb 3.2 oz (60.873 kg)  BMI 23.02 kg/m2  SpO2 98% General- Alert, Oriented, Affect-appropriate, Distress- none acute. Medium build Skin- + excoriated areas on back and  buttocks but otherwise no apparent rash. Oral mucosa is coated. Dry skin. Lymphadenopathy- none Head- atraumatic            Eyes- Gross vision intact, PERRLA, conjunctivae and secretions clear            Ears- Hearing, canals-normal            Nose- Clear, no-Septal dev, mucus, polyps, erosion, perforation             Throat- Mallampati II , mucosa clear , drainage- none, tonsils- atrophic Neck- flexible , trachea midline, no stridor , thyroid nl, carotid no bruit Chest - symmetrical excursion , unlabored           Heart/CV- RRR , no murmur , no gallop  , no rub, nl s1 s2                           - JVD- none , edema- none, stasis changes- none, varices- none           Lung- clear to P&A, wheeze- none, cough- none , dullness-none, rub- none           Chest wall-  Abd-  Br/ Gen/ Rectal- Not done, not indicated Extrem- cyanosis- none, clubbing, none, atrophy- none, strength- nl Neuro- grossly intact to observation

## 2013-02-26 NOTE — Patient Instructions (Addendum)
Order- Schedule NPSG split night protocol    Dx OSA  Script [facility] Magidc Mouthwash sent

## 2013-02-28 DIAGNOSIS — G4733 Obstructive sleep apnea (adult) (pediatric): Secondary | ICD-10-CM | POA: Insufficient documentation

## 2013-02-28 NOTE — Assessment & Plan Note (Signed)
Hyper IgE can be an inherited variant. We don't have evidence about possible parasite infection.

## 2013-02-28 NOTE — Assessment & Plan Note (Signed)
She complains of urticaria, generalized edema, swollen glands in her mouth and, and veins on the backs of her hands. None of this is apparent on examination at this visit. I think there is a significant anxiety component.  For her complaints about her mouth, I will let her try Magic mouthwash.

## 2013-02-28 NOTE — Assessment & Plan Note (Signed)
She wakes anxious, short of breath and with heart pounding. Her husband has sleep apnea and wears CPAP. He says she snores occasionally but he may not notice. After discussion we felt it was best to settle the issue by getting a sleep study.

## 2013-03-01 ENCOUNTER — Encounter (HOSPITAL_BASED_OUTPATIENT_CLINIC_OR_DEPARTMENT_OTHER): Payer: Medicare Other

## 2013-03-02 ENCOUNTER — Encounter (HOSPITAL_BASED_OUTPATIENT_CLINIC_OR_DEPARTMENT_OTHER): Payer: Medicare Other

## 2013-03-03 ENCOUNTER — Telehealth: Payer: Self-pay | Admitting: Internal Medicine

## 2013-03-03 MED ORDER — DIPHENHYD-HYDROCORT-NYSTATIN MT SUSP: OROMUCOSAL | Status: DC

## 2013-03-03 NOTE — Telephone Encounter (Signed)
Pt called back and she is aware that the rx for the mouth wash has been sent in to [facility] on [address].  Called [facility] and they are aware that this has been sent in to another pharmacy and nothing further is needed.

## 2013-03-03 NOTE — Telephone Encounter (Signed)
[facility] MOUTHWASH IS CURRENTLY NOT AVAILABLE . PHARMACY REQUESTING YOU CHANGE TO SOMETHING ELSE. Allergies  Allergen Reactions  . Beef-Derived Products Anaphylaxis  . Codeine Hives and Anxiety  . Latex Rash  . Penicillins Hives  . Barbiturates     excitement  . Bee Pollen   . Bee Venom Hives  . Celebrex (Celecoxib)     Unknown  . Diovan (Valsartan) Nausea Only  . Ivp Dye (Iodinated Diagnostic Agents)     Pt states can take if Benadryl given first and dye is not fish based  . Lanolin     Unknown  . Mobic (Meloxicam)     Unknown  . Morphine And Related     Tremors, increased heart rate, excitement, confusion per pt  . Nutritional Supplements     Nuts, strawberries, bicarbonate of soda,  . Other     NARCOTICS ANALGESICS-TREMORS, INCREASED HEART RATE    . Oxycodone   . Petroleum Jelly (Petrolatum)   . Promethazine-Dm     "Felt funny"  . Shellfish Allergy Hives  . Soy Allergy   . Strawberry   . Tape     Red blotches  . Tylox (Oxycodone-Acetaminophen)   . Epinephrine Palpitations  . Nitrates, Organic Rash    Chest tightness  . Sulfa Antibiotics Hives and Rash   DR yOUNG  PLEASE ADVISE  THANK YOU

## 2013-03-03 NOTE — Telephone Encounter (Signed)
lmtcb x1 for to see if we can send RX to another pharmacy

## 2013-03-03 NOTE — Telephone Encounter (Signed)
RX has been sent to the pharmacy.

## 2013-03-03 NOTE — Telephone Encounter (Signed)
Per CY-see what the pharmacy has on hand and give patient; such [facility] (not [facility]).

## 2013-03-16 ENCOUNTER — Ambulatory Visit (HOSPITAL_BASED_OUTPATIENT_CLINIC_OR_DEPARTMENT_OTHER): Payer: Medicare Other | Attending: Internal Medicine | Admitting: Radiology

## 2013-03-16 VITALS — Ht 64.0 in | Wt 136.0 lb

## 2013-03-16 DIAGNOSIS — G4700 Insomnia, unspecified: Secondary | ICD-10-CM | POA: Insufficient documentation

## 2013-03-16 DIAGNOSIS — G4733 Obstructive sleep apnea (adult) (pediatric): Secondary | ICD-10-CM

## 2013-03-18 ENCOUNTER — Encounter: Payer: Self-pay | Admitting: Internal Medicine

## 2013-03-18 ENCOUNTER — Ambulatory Visit (INDEPENDENT_AMBULATORY_CARE_PROVIDER_SITE_OTHER): Payer: Medicare Other | Admitting: Internal Medicine

## 2013-03-18 VITALS — BP 118/70 | HR 74 | Ht 63.75 in | Wt 133.2 lb

## 2013-03-18 DIAGNOSIS — D71 Functional disorders of polymorphonuclear neutrophils: Secondary | ICD-10-CM

## 2013-03-18 DIAGNOSIS — G4733 Obstructive sleep apnea (adult) (pediatric): Secondary | ICD-10-CM

## 2013-03-18 DIAGNOSIS — D824 Hyperimmunoglobulin E [IgE] syndrome: Secondary | ICD-10-CM

## 2013-03-18 DIAGNOSIS — L500 Allergic urticaria: Secondary | ICD-10-CM

## 2013-03-18 DIAGNOSIS — K219 Gastro-esophageal reflux disease without esophagitis: Secondary | ICD-10-CM

## 2013-03-18 NOTE — Progress Notes (Signed)
08/25/12- [name] never smoker -Self referral-was at beach and started having whelps and rash with itching-unknown cause-sent by ER for consult; had peaches and kale(this is the only unusual items she had)(spouse had eaten shrimp around her and she is aware that she is allergic to that); Still having out breaks at this time. She has a history of allergic rhinitis going back many years and has been on allergy vaccine at 2 different times. Also has a history of urticaria from eating shrimp. For 5 days prior to this visit she had onset of hives on her shoulders. Lesions gradually spread she began Benadryl. Throat felt tight so she went to emergency room on 08/23/2012. Her husband and daughter sitting next to her had eaten shrimp. She ate peaches a few hours before onset of the hives. Despite prednisone taper and Benadryl from ER, some urticarial lesions have continued. She has not been wheezing. She denies history of liver or thyroid disease. She has had a past intolerance to latex, contrast dye and aspirin. She had not felt acutely ill prior onset of the hives.  09/28/12- [name] never smoker followed for urticaria/ food allergy/ shrimp     husband here Allergy profile- 08/25/2012-elevated IgE 2030.5 with secondary elevation of almost all foods and inhalant allergens on the panel. Hives have been much better but not quite gone. Minor hayfever in recent years treated with daily Allegra. She added Pepcid 20 mg. This also helps heartburn/acid indigestion. Denies wheezing or cough.  12/29/12-  [name] never smoker followed for urticaria/ food allergy/ shrimp/ peaches     Husband here FOLLOWS FOR: has had some itching. ate pineapple the other day and her throat started burning. On December 24 after eating "Vegan cakes" , nondairy, she got a pruritic rash on her hand. She suspects itching and rash related to poor, flavoring, fennell, brussels sprouts. Pineapple burned her throat. She is trying to keep a food diary.  She continues Allegra and Pepcid. She thinks she is newly sensitive to a beauty parlor color rinse for her hair.  02/26/13- [name] never smoker followed for urticaria/ food allergy/ shrimp/ peaches    ACUTE VISIT:edema in hands, face and glands in mouth; body gets hot and veins get large. No exposure out of the normal that could have caused this reaction.  Worked up for HBP w/ renal insuf. Complains veins in hands are swelling. ENT / Dr Suszanne Conners for tinnitus, dry mouth. Treated for acid reflux. Shows me pictures of unremarkable prominent veins on hands. Has notebook full of notations about her health. Episodes of waking startled with gag, pounding heart. Husband corroborates. They ? OSA. Says she burns under skin, diffusely, no rash .IgE 01/08/13- 1825.Specific allergen antibodies secondarily increased.  CBC and CMET ok, ANA neg, Sed rate 28. Alpha-gal Neg.  03/18/13- [name] never smoker followed for urticaria/ food allergy/ shrimp/ peaches, gag/ palpitation in sleep.  Husband here FOLLOWS FOR: review Sleep Study with patient(done on 03-16-2013)  blood pressure medications were changed. Has been waking with one daughter recognized as panic attack. Diagnosed with GERD based on barium swallow in the past. NPSG 03/16/13- normal, AHI 0.7 per hour with moderate snoring, oxygen desaturation to a nadir of 95%. I explained that this was within normal limits and she does not have sleep apnea No significant recent problems with itching or hives.  ROS-see HPI Constitutional:   No-   weight loss, night sweats, fevers, chills, fatigue, lassitude. HEENT:   No-  headaches, difficulty swallowing,  tooth/dental problems, sore throat,       No- sneezing, +itching, no-ear ache, nasal congestion, post nasal drip,  CV:  No-   chest pain, orthopnea, PND, swelling in lower extremities, anasarca,  dizziness, palpitations Resp: No-   shortness of breath with exertion or at rest.              No-   productive cough,  No  non-productive cough,  No- coughing up of blood.              No-   change in color of mucus.  No- wheezing.   Skin: + HPI GI:  +  heartburn, indigestion, abdominal pain, nausea, vomiting,  GU:  MS:  No-   joint pain or swelling.   Neuro-     nothing unusual Psych:  No- change in mood or affect. No depression, +anxiety.  No memory loss.  OBJ- Physical Exam   General- Alert, Oriented, Affect-somewhat anxious, Distress- none acute. Medium build Skin-  no apparent rash. Oral mucosa is coated. Dry skin. Lymphadenopathy- none Head- atraumatic            Eyes- Gross vision intact, PERRLA, conjunctivae and secretions clear            Ears- Hearing, canals-normal            Nose- Clear, no-Septal dev, mucus, polyps, erosion, perforation             Throat- Mallampati II , mucosa clear , drainage- none, tonsils- atrophic Neck- flexible , trachea midline, no stridor , thyroid nl, carotid no bruit Chest - symmetrical excursion , unlabored           Heart/CV- RRR , no murmur , no gallop  , no rub, nl s1 s2                           JVD+ 1 cm , edema- none, stasis changes- none, varices- none           Lung- clear to P&A, wheeze- none, cough- none , dullness-none, rub- none           Chest wall-  Abd-  Br/ Gen/ Rectal- Not done, not indicated Extrem- cyanosis- none, clubbing, none, atrophy- none, strength- nl Neuro- grossly intact to observation

## 2013-03-18 NOTE — Patient Instructions (Addendum)
Ask Dr Cliffton Asters about your adrenal gland questions.  I can see you back as needed for allergy  Avoid foods that definitely bother you.  You have been diagnosed with esophageal reflux in the past. That is probably the explanation for the episodes when you wake at night gagging.

## 2013-03-21 DIAGNOSIS — R0609 Other forms of dyspnea: Secondary | ICD-10-CM

## 2013-03-21 DIAGNOSIS — G4700 Insomnia, unspecified: Secondary | ICD-10-CM

## 2013-03-21 DIAGNOSIS — R0989 Other specified symptoms and signs involving the circulatory and respiratory systems: Secondary | ICD-10-CM

## 2013-03-21 DIAGNOSIS — G4730 Sleep apnea, unspecified: Secondary | ICD-10-CM

## 2013-03-21 NOTE — Procedures (Signed)
NAME:  Donna Baldwin, Donna Baldwin          ACCOUNT NO.:  [id number]  MEDICAL RECORD NO.:  [id number]          PATIENT TYPE:  OUT  LOCATION:  SLEEP CENTER                 FACILITY:  [facility]  PHYSICIAN:  Vlada Uriostegui D. Maple Hudson, MD, FCCP, FACPDATE OF BIRTH:  01-09-1941  DATE OF STUDY:  03/16/2013                           NOCTURNAL POLYSOMNOGRAM  REFERRING PHYSICIAN:  Edelyn Heidel D. Nani Ingram, MD, FCCP, FACP  INDICATION FOR STUDY:  Insomnia with sleep apnea.  EPWORTH SLEEPINESS SCORE:  13/24.  BMI 23.3, weight 136 pounds.  Height 64 inches.  Neck 12.5 inches.  MEDICATIONS:  Home medications are charted and reviewed.  SLEEP ARCHITECTURE:  Total sleep time minutes with sleep efficiency 46.7%.  Stage I was 19%, stage II 70.5%, stage III absent, REM 10.5% of total sleep time.  Sleep latency 12 minutes, REM latency 248.5 minutes, awake after sleep onset 184 minutes.  Arousal index 35.4.  Bedtime medication:  Labetalol.  Sustained sleep was not achieved until 2:00 a.m.  RESPIRATORY DATA:  Apnea-hypopnea index (AHI) 0.7 per hour.  Only 2 events were scored, 1 as an obstructive apnea and 1 at the central apnea.  Both were associated with non-supine sleep position.  REM AHI 0. She did not meet protocol requirements for CPAP titration.  OXYGEN DATA:  Moderate snoring with oxygen desaturation to a nadir of 95% and mean oxygen saturation through the study of 98.1% on room air.  CARDIAC DATA:  Sinus rhythm with occasional PVC.  MOVEMENT/PARASOMNIA:  No significant movement disturbance.  No bathroom trips.  IMPRESSION/RECOMMENDATION: 1. Sleep architecture was significant for delayed sleep onset and     difficulty maintaining sleep.  Consider managing as insomnia. 2. Rare respiratory event with sleep disturbance, within normal     limits.  AHI 0.7 per hour (the normal range for     adults is from 0-5 events per hour).  Moderate snoring with oxygen     desaturation to a nadir of 95% and mean oxygen saturation  through     the study of 98.1% on room air.     Hensley Aziz D. Maple Hudson, MD, [facility], FACP Diplomate, American Board of Sleep Medicine    CDY/MEDQ  D:  03/21/2013 12:11:20  T:  03/21/2013 16:31:26  Job:  [id number]

## 2013-03-24 DIAGNOSIS — K219 Gastro-esophageal reflux disease without esophagitis: Secondary | ICD-10-CM | POA: Insufficient documentation

## 2013-03-24 NOTE — Assessment & Plan Note (Signed)
She will avoid foods that she associates with problems otherwise treat for dry skin and watch. Antihistamine if needed.

## 2013-03-24 NOTE — Assessment & Plan Note (Signed)
She is instructed to avoid those foods that she definitely associates with problems and to use an antihistamine if needed. We will watch for recurrence of her hives.

## 2013-03-24 NOTE — Assessment & Plan Note (Signed)
We discussed reflux precautions, advising elevation of the head of the bed

## 2013-03-24 NOTE — Assessment & Plan Note (Signed)
Reassurance with discussion of good sleep hygiene.

## 2013-03-29 ENCOUNTER — Other Ambulatory Visit: Payer: Self-pay

## 2013-03-29 DIAGNOSIS — Z1231 Encounter for screening mammogram for malignant neoplasm of breast: Secondary | ICD-10-CM

## 2013-04-02 ENCOUNTER — Ambulatory Visit
Admission: RE | Admit: 2013-04-02 | Discharge: 2013-04-02 | Disposition: A | Discharge status: Home / Self Care | Payer: Medicare Other | Source: Ambulatory Visit

## 2013-04-02 DIAGNOSIS — Z1231 Encounter for screening mammogram for malignant neoplasm of breast: Secondary | ICD-10-CM

## 2013-04-06 ENCOUNTER — Other Ambulatory Visit: Payer: Self-pay | Admitting: Family Medicine

## 2013-04-06 DIAGNOSIS — R634 Abnormal weight loss: Secondary | ICD-10-CM

## 2013-04-06 DIAGNOSIS — IMO0001 Reserved for inherently not codable concepts without codable children: Secondary | ICD-10-CM

## 2013-04-06 DIAGNOSIS — R109 Unspecified abdominal pain: Secondary | ICD-10-CM

## 2013-04-14 ENCOUNTER — Inpatient Hospital Stay: Admission: RE | Admit: 2013-04-14 | Payer: Medicare Other | Source: Ambulatory Visit

## 2013-04-22 ENCOUNTER — Ambulatory Visit
Admission: RE | Admit: 2013-04-22 | Discharge: 2013-04-22 | Disposition: A | Discharge status: Home / Self Care | Payer: Medicare Other | Source: Ambulatory Visit | Attending: Family Medicine | Admitting: Family Medicine

## 2013-04-22 DIAGNOSIS — IMO0001 Reserved for inherently not codable concepts without codable children: Secondary | ICD-10-CM

## 2013-04-22 DIAGNOSIS — R634 Abnormal weight loss: Secondary | ICD-10-CM

## 2013-04-22 DIAGNOSIS — R109 Unspecified abdominal pain: Secondary | ICD-10-CM

## 2013-12-02 ENCOUNTER — Institutional Professional Consult (permissible substitution): Payer: Medicare Other | Admitting: Cardiology

## 2013-12-03 ENCOUNTER — Encounter: Payer: Self-pay | Admitting: Cardiology

## 2013-12-03 ENCOUNTER — Ambulatory Visit (INDEPENDENT_AMBULATORY_CARE_PROVIDER_SITE_OTHER): Payer: Medicare Other | Admitting: Cardiology

## 2013-12-03 VITALS — BP 176/75 | HR 58 | Ht 63.0 in | Wt 137.8 lb

## 2013-12-03 DIAGNOSIS — E7849 Other hyperlipidemia: Secondary | ICD-10-CM

## 2013-12-03 DIAGNOSIS — E785 Hyperlipidemia, unspecified: Secondary | ICD-10-CM

## 2013-12-03 DIAGNOSIS — I700 Atherosclerosis of aorta: Secondary | ICD-10-CM

## 2013-12-03 DIAGNOSIS — I10 Essential (primary) hypertension: Secondary | ICD-10-CM

## 2013-12-03 DIAGNOSIS — E782 Mixed hyperlipidemia: Secondary | ICD-10-CM | POA: Insufficient documentation

## 2013-12-03 HISTORY — DX: Essential (primary) hypertension: I10

## 2013-12-03 NOTE — Progress Notes (Signed)
[address] [place], [place]  [address] Phone: [phone] Fax:  [phone]  Date:  12/03/2013   ID:  Donna Baldwin, DOB 11-03-1941, MRN [id number]  PCP:  Donna Bradford, MD   History of Present Illness: Donna Baldwin is a 72 y.o. female here for the evaluation of aortic atherosclerosis. This was seen on CT scan on 04/06/13 which was ordered to exclude pheochromocytoma. Atherosclerotic calcifications were noted of abdominal aorta as well as iliac vessels. This was personally viewed and demonstrated to her.   She has struggled with hypertension and has tried different medications managed by Dr. Cliffton Asters. There was no evidence of pheochromocytoma. She is also struggled in the past with hyperlipidemia and inability to take statin medications because of muscle aches. She also states that she cannot take red yeast rice. In addition, she states that occasionally she will get palpitations, these are intermittent and usually under periods of high stress. She has not felt them in quite some time. No associated chest pain, shortness of breath or syncope.   Has GERD, hiatal hernia. No tob. No DM.   Wt Readings from Last 3 Encounters:  12/03/13 137 lb 12.8 oz (62.506 kg)  03/18/13 133 lb 3.2 oz (60.419 kg)  03/16/13 136 lb (61.689 kg)     Past Medical History  Diagnosis Date  . Hypertension   . Back pain   . Anemia   . Thrombocytopenia     No past surgical history on file.  Current Outpatient Prescriptions  Medication Sig Dispense Refill  . aspirin 81 MG tablet Take 81 mg by mouth daily.      . cycloSPORINE (RESTASIS) 0.05 % ophthalmic emulsion 1 drop 2 (two) times daily.      [name] docusate sodium (COLACE) 100 MG capsule Take 100 mg by mouth as needed.       . ergocalciferol (VITAMIN D2) 50000 UNITS capsule Take 50,000 Units by mouth once a week.      . Homeopathic Products (CALENDULA) GEL Apply topically.      [name] labetalol (NORMODYNE) 200 MG tablet Take 100  mg by mouth once.       [name] LORazepam (ATIVAN) 1 MG tablet Take 1/2 to 1 by mouth daily as needed      . Propylene Glycol (SYSTANE BALANCE) 0.6 % SOLN Apply to eye.       No current facility-administered medications for this visit.    Allergies:    Allergies  Allergen Reactions  . Beef-Derived Products Anaphylaxis  . Codeine Hives and Anxiety  . Latex Rash  . Penicillins Hives  . Barbiturates     excitement  . Bee Pollen   . Bee Venom Hives  . Celebrex [Celecoxib]     Unknown  . Diovan [Valsartan] Nausea Only  . Ivp Dye [Iodinated Diagnostic Agents]     Pt states can take if Benadryl given first and dye is not fish based  . Lanolin     Unknown  . Mobic [Meloxicam]     Unknown  . Morphine And Related     Tremors, increased heart rate, excitement, confusion per pt  . Nutritional Supplements     Nuts, strawberries, bicarbonate of soda,  . Other     NARCOTICS ANALGESICS-TREMORS, INCREASED HEART RATE    . Oxycodone   . Petroleum Jelly [Petrolatum]   . Promethazine-Dm     "Felt funny"  . Shellfish Allergy Hives  . Soy Allergy   .  Strawberry   . Tape     Red blotches  . Tylox [Oxycodone-Acetaminophen]   . Epinephrine Palpitations  . Nitrates, Organic Rash    Chest tightness  . Sulfa Antibiotics Hives and Rash    Social History:  The patient  reports that she has never smoked. She does not have any smokeless tobacco history on file. She reports that she does not drink alcohol or use illicit drugs.   No family history on file.  ROS:  Please see the history of present illness.   Denies any strokelike symptoms, no fevers, no rash, no orthopnea, no PND, no chest pain, no excessive shortness of breath, no syncope, no bleeding   All other systems reviewed and negative.   PHYSICAL EXAM: VS:  BP 176/75  Pulse 58  Ht 5\' 3"  (1.6 m)  Wt 137 lb 12.8 oz (62.506 kg)  BMI 24.42 kg/m2 Well nourished, well developed, in no acute distress mildly anxious HEENT: normal, [place]/AT,  EOMI Neck: no JVD, normal carotid upstroke, no bruit Cardiac:  normal S1, S2; RRR; no murmur Lungs:  clear to auscultation bilaterally, no wheezing, rhonchi or rales Abd: soft, nontender, no hepatomegaly, no bruits Ext: no edema, 2+ distal pulses Skin: warm and dry GU: deferred Neuro: no focal abnormalities noted, AAO x 3  EKG:  Sinus rhythm/sinus bradycardia rate 57 with no other abnormalities. Mild sinus arrhythmia noted. CT scan: Personally reviewed and demonstrated to patient.     ASSESSMENT AND PLAN:  1. Aortic atherosclerosis-one of her fevers was that her aorta might rupture. I called this fear and explained to her that this is the physiology of abdominal aortic aneurysm which she does not have. In review of medical records, she had had a previous CT scan I believe in 2006 which did not demonstrate any AAA obviously at that time. I explained to her the pathophysiology behind aortic calcification/atherosclerotic disease. It is important to help modify risk factors as best as possible. This is paramount for blood pressure control. She will continue to work hard with Dr. Cliffton Asters on this matter. Reassuringly, no evidence of pheochromocytoma. As a suggestion, spironolactone is sometimes beneficial in these situations. You could try 25 mg once a day to start with monitoring of basic metabolic profile. Also, she states that she's unable to take statin medications. She has struggled as well with red yeast rice extract. I suggest low fat diet, exercise. Her LDL was 199. Dr. Cliffton Asters tried to start her on Crestor 10 mg. I strongly suggest that she continue to trial this medication. It is likely that she has familial hyperlipidemia. This can put her at increased risk for stroke, heart attack. 2. Familial hyperlipidemia-as above. Strongly encouraged statin use. 3. Hypertension-see above for comments. 4. No further cardiac testing needed at this time. She can come back on as-needed basis.  Signed, Donato Schultz, MD [facility]  12/03/2013 4:09 PM

## 2013-12-03 NOTE — Patient Instructions (Signed)
Your physician recommends that you schedule a follow-up appointment as needed.   Your physician recommends that you continue on your current medications as directed. Please refer to the Current Medication list given to you today.  

## 2014-03-31 ENCOUNTER — Other Ambulatory Visit: Payer: Self-pay

## 2014-03-31 DIAGNOSIS — Z1231 Encounter for screening mammogram for malignant neoplasm of breast: Secondary | ICD-10-CM

## 2014-04-08 ENCOUNTER — Ambulatory Visit
Admission: RE | Admit: 2014-04-08 | Discharge: 2014-04-08 | Disposition: A | Discharge status: Home / Self Care | Payer: Commercial Managed Care - HMO | Source: Ambulatory Visit

## 2014-04-08 DIAGNOSIS — Z1231 Encounter for screening mammogram for malignant neoplasm of breast: Secondary | ICD-10-CM

## 2014-04-14 ENCOUNTER — Emergency Department (INDEPENDENT_AMBULATORY_CARE_PROVIDER_SITE_OTHER)
Admission: EM | Admit: 2014-04-14 | Discharge: 2014-04-14 | Disposition: A | Discharge status: Home / Self Care | Payer: Commercial Managed Care - HMO | Source: Home / Self Care | Attending: Family Medicine | Admitting: Family Medicine

## 2014-04-14 ENCOUNTER — Encounter (HOSPITAL_COMMUNITY): Payer: Self-pay | Admitting: Emergency Medicine

## 2014-04-14 DIAGNOSIS — T148 Other injury of unspecified body region: Secondary | ICD-10-CM

## 2014-04-14 DIAGNOSIS — W57XXXA Bitten or stung by nonvenomous insect and other nonvenomous arthropods, initial encounter: Secondary | ICD-10-CM

## 2014-04-14 NOTE — Discharge Instructions (Signed)
Bedbugs Bedbugs are tiny bugs that live in and around beds. During the day, they hide in mattresses and other places near beds. They come out at night and bite people lying in bed. They need blood to live and grow. Bedbugs can be found in beds anywhere. Usually, they are found in places where many people come and go (hotels, shelters, hospitals). It does not matter whether the place is dirty or clean. Getting bitten by bedbugs rarely causes a medical problem. The biggest problem can be getting rid of them. This often takes the work of a Financial risk analyst. CAUSES  Less use of pesticides. Bedbugs were common before the 1950s. Then, strong pesticides such as DDT nearly wiped them out. Today, these pesticides are not used because they harm the environment and can cause health problems.  More travel. Besides mattresses, bedbugs can also live in clothing and luggage. They can come along as people travel from place to place. Bedbugs are more common in certain parts of the world. When people travel to those areas, the bugs can come home with them.  Presence of birds and bats. Bedbugs often infest birds and bats. If you have these animals in or near your home, bedbugs may infest your house, too. SYMPTOMS It does not hurt to be bitten by a bedbug. You will probably not wake up when you are bitten. Bedbugs usually bite areas of the skin that are not covered. Symptoms may show when you wake up, or they may take a day or more to show up. Symptoms may include:  Small red bumps on the skin. These might be lined up in a row or clustered in a group.  A darker red dot in the middle of red bumps.  Blisters on the skin. There may be swelling and very bad itching. These may be signs of an allergic reaction. This does not happen often. DIAGNOSIS Bedbug bites might look and feel like other types of insect bites. The bugs do not stay on the body like ticks or lice. They bite, drop off, and crawl away to hide. Your  caregiver will probably:  Ask about your symptoms.  Ask about your recent activities and travel.  Check your skin for bedbug bites.  Ask you to check at home for signs of bedbugs. You should look for:  Spots or stains on the bed or nearby. This could be from bedbugs that were crushed or from their eggs or waste.  Bedbugs themselves. They are reddish-brown, oval, and flat. They do not fly. They are about the size of an apple seed.  Places to look for bedbugs include:  Beds. Check mattresses, headboards, box springs, and bed frames.  On drapes and curtains near the bed.  Under carpeting in the bedroom.  Behind electrical outlets.  Behind any wallpaper that is peeling.  Inside luggage. TREATMENT Most bedbug bites do not need treatment. They usually go away on their own in a few days. The bites are not dangerous. However, treatment may be needed if you have scratched so much that your skin has become infected. You may also need treatment if you are allergic to bedbug bites. Treatment options include:  A drug that stops swelling and itching (corticosteroid). Usually, a cream is rubbed on the skin. If you have a bad rash, you may be given a corticosteroid pill.  Oral antihistamines. These are pills to help control itching.  Antibiotic medicines. An antibiotic may be prescribed for infected skin. HOME CARE INSTRUCTIONS  Take any medicine prescribed by your caregiver for your bites. Follow the directions carefully.  Consider wearing pajamas with long sleeves and pant legs.  Your bedroom may need to be treated. A pest control expert should make sure the bedbugs are gone. You may need to throw away mattresses or luggage. Ask the pest control expert what you can do to keep the bedbugs from coming back. Common suggestions include:  Putting a plastic cover over your mattress.  Washing and drying your clothes and bedding in hot water and a hot dryer. The temperature should be hotter  than 120 F (48.9 C). Bedbugs are killed by high temperatures.  Vacuuming carefully all around your bed. Vacuum in all cracks and crevices where the bugs might hide. Do this often.  Carefully checking all used furniture, bedding, or clothes that you bring into your house.  Eliminating bird nests and bat roosts.  If you get bedbug bites when traveling, check all your possessions carefully before bringing them into your house. If you find any bugs on clothes or in your luggage, consider throwing those items away. SEEK MEDICAL CARE IF:  You have red bug bites that keep coming back.  You have red bug bites that itch badly.  You have bug bites that cause a skin rash.  You have scratch marks that are red and sore. SEEK IMMEDIATE MEDICAL CARE IF: You have a fever. Document Released: 01/18/2011 Document Revised: 03/09/2012 Document Reviewed: 01/18/2011 [facility] Patient Information 2014 [place], [place]. Insect Bite Mosquitoes, flies, fleas, bedbugs, and many other insects can bite. Insect bites are different from insect stings. A sting is when venom is injected into the skin. Some insect bites can transmit infectious diseases. SYMPTOMS  Insect bites usually turn red, swell, and itch for 2 to 4 days. They often go away on their own. TREATMENT  Your caregiver may prescribe antibiotic medicines if a bacterial infection develops in the bite. HOME CARE INSTRUCTIONS  Do not scratch the bite area.  Keep the bite area clean and dry. Wash the bite area thoroughly with soap and water.  Put ice or cool compresses on the bite area.  Put ice in a plastic bag.  Place a towel between your skin and the bag.  Leave the ice on for 20 minutes, 4 times a day for the first 2 to 3 days, or as directed.  You may apply a baking soda paste, cortisone cream, or calamine lotion to the bite area as directed by your caregiver. This can help reduce itching and swelling.  Only take over-the-counter or  prescription medicines as directed by your caregiver.  If you are given antibiotics, take them as directed. Finish them even if you start to feel better. You may need a tetanus shot if:  You cannot remember when you had your last tetanus shot.  You have never had a tetanus shot.  The injury broke your skin. If you get a tetanus shot, your arm may swell, get red, and feel warm to the touch. This is common and not a problem. If you need a tetanus shot and you choose not to have one, there is a rare chance of getting tetanus. Sickness from tetanus can be serious. SEEK IMMEDIATE MEDICAL CARE IF:   You have increased pain, redness, or swelling in the bite area.  You see a red line on the skin coming from the bite.  You have a fever.  You have joint pain.  You have a headache or neck pain.  You have unusual weakness.  You have a rash.  You have chest pain or shortness of breath.  You have abdominal pain, nausea, or vomiting.  You feel unusually tired or sleepy. MAKE SURE YOU:   Understand these instructions.  Will watch your condition.  Will get help right away if you are not doing well or get worse. Document Released: 01/23/2005 Document Revised: 03/09/2012 Document Reviewed: 07/17/2011 [facility] Patient Information 2014 [place].

## 2014-04-14 NOTE — ED Notes (Signed)
Patient has scattered hives, itching.  First episode of hive/itching was on left arm a few days ago.  This seemed to resolve and then this am break out included both arms, neck.  Patient is not sure what caused this .  Tried new lip stick and has been taking meloxicam for a week.  This medicine is new to her.  Patient has many allergies

## 2014-04-14 NOTE — ED Provider Notes (Signed)
CSN: [id number]     Arrival date & time 04/14/14  1221 History   First MD Initiated Contact with Patient 04/14/14 1433     Chief Complaint  Patient presents with  . Allergic Reaction   (Consider location/radiation/quality/duration/timing/severity/associated sxs/prior Treatment) Patient is a 73 y.o. female presenting with allergic reaction. The history is provided by the patient. No language interpreter was used.  Allergic Reaction Presenting symptoms: rash   Severity:  Mild Prior allergic episodes:  No prior episodes Context: insect bite/sting   Relieved by:  Nothing Worsened by:  Nothing tried   Past Medical History  Diagnosis Date  . Hypertension   . Back pain   . Anemia   . Thrombocytopenia   . HTN (hypertension) 12/03/2013    CT scan-no pheochromocytoma   Past Surgical History  Procedure Laterality Date  . Knee arthroscopy with excision baker's cyst     No family history on file. History  Substance Use Topics  . Smoking status: Never Smoker   . Smokeless tobacco: Not on file  . Alcohol Use: No   OB History   Grav Para Term Preterm Abortions TAB SAB Ect Mult Living                 Review of Systems  Skin: Positive for rash.  All other systems reviewed and are negative.   Allergies  Beef-derived products; Codeine; Latex; Penicillins; Barbiturates; Bee pollen; Bee venom; Celebrex; Diovan; Ivp dye; Lanolin; Mobic; Morphine and related; Nutritional supplements; Other; Oxycodone; Petroleum jelly; Promethazine-dm; Shellfish allergy; Soy allergy; Strawberry; Tape; Tylox; Epinephrine; Nitrates, organic; and Sulfa antibiotics  Home Medications   Prior to Admission medications   Medication Sig Start Date End Date Taking? Authorizing Provider  meloxicam (MOBIC) 7.5 MG tablet Take 7.5 mg by mouth daily.   Yes Historical Provider, MD  aspirin 81 MG tablet Take 81 mg by mouth daily.    Historical Provider, MD  cycloSPORINE (RESTASIS) 0.05 % ophthalmic emulsion 1 drop 2  (two) times daily.    Historical Provider, MD  docusate sodium (COLACE) 100 MG capsule Take 100 mg by mouth as needed.     Historical Provider, MD  ergocalciferol (VITAMIN D2) 50000 UNITS capsule Take 50,000 Units by mouth once a week.    Historical Provider, MD  Homeopathic Products (CALENDULA) GEL Apply topically.    Historical Provider, MD  labetalol (NORMODYNE) 200 MG tablet Take 100 mg by mouth once.     Historical Provider, MD  LORazepam (ATIVAN) 1 MG tablet Take 1/2 to 1 by mouth daily as needed    Historical Provider, MD  Propylene Glycol (SYSTANE BALANCE) 0.6 % SOLN Apply to eye.    Historical Provider, MD   BP 196/76  Pulse 56  Temp(Src) 97.5 F (36.4 C) (Oral)  Resp 16  SpO2 100% Physical Exam  Nursing note and vitals reviewed. Constitutional: She is oriented to person, place, and time. She appears well-developed and well-nourished.  HENT:  Head: Normocephalic.  Eyes: EOM are normal.  Neck: Normal range of motion.  Musculoskeletal: Normal range of motion.  Neurological: She is alert and oriented to person, place, and time.  Skin:  Few scattered raised red areas.  (look like bites)  Psychiatric: She has a normal mood and affect.    ED Course  Procedures (including critical care time) Labs Review Labs Reviewed - No data to display  Results for orders placed during the hospital encounter of 02/14/13  CBC      Result Value Ref  Range   WBC 4.7  4.0 - 10.5 K/uL   RBC 5.65 (*) 3.87 - 5.11 MIL/uL   Hemoglobin 12.8  12.0 - 15.0 g/dL   HCT 40.4  36.0 - 46.0 %   MCV 71.5 (*) 78.0 - 100.0 fL   MCH 22.7 (*) 26.0 - 34.0 pg   MCHC 31.7  30.0 - 36.0 g/dL   RDW 15.1  11.5 - 15.5 %   Platelets 150  150 - 400 K/uL  BASIC METABOLIC PANEL      Result Value Ref Range   Sodium 142  135 - 145 mEq/L   Potassium 3.7  3.5 - 5.1 mEq/L   Chloride 106  96 - 112 mEq/L   CO2 23  19 - 32 mEq/L   Glucose, Bld 117 (*) 70 - 99 mg/dL   BUN 9  6 - 23 mg/dL   Creatinine, Ser 0.78  0.50 -  1.10 mg/dL   Calcium 10.0  8.4 - 10.5 mg/dL   GFR calc non Af Amer 82 (*) >90 mL/min   GFR calc Af Amer >90  >90 mL/min  PRO B NATRIURETIC PEPTIDE      Result Value Ref Range   Pro B Natriuretic peptide (BNP) 30.2  0 - 125 pg/mL  POCT I-STAT TROPONIN I      Result Value Ref Range   Troponin i, poc 0.00  0.00 - 0.08 ng/mL   Comment 3           POCT I-STAT TROPONIN I      Result Value Ref Range   Troponin i, poc 0.00  0.00 - 0.08 ng/mL   Comment 3            Imaging Review No results found.   MDM   1. Insect bites     Pt has seen some bugs.   Sounds like bed bugs.   I advised hydrocortisone cream    Fransico Meadow, PA-C 04/14/14 1443

## 2014-04-15 NOTE — ED Provider Notes (Signed)
Medical screening examination/treatment/procedure(s) were performed by a resident physician or non-physician practitioner and as the supervising physician I was immediately available for consultation/collaboration.  Reeva Davern, MD    Renny Remer S Jenet Durio, MD 04/15/14 0823 

## 2014-05-11 ENCOUNTER — Ambulatory Visit (HOSPITAL_COMMUNITY)
Admission: RE | Admit: 2014-05-11 | Discharge: 2014-05-11 | Disposition: A | Discharge status: Home / Self Care | Payer: Medicare HMO | Source: Ambulatory Visit | Attending: Vascular Surgery | Admitting: Vascular Surgery

## 2014-05-11 ENCOUNTER — Other Ambulatory Visit (HOSPITAL_COMMUNITY): Payer: Self-pay | Admitting: Family Medicine

## 2014-05-11 DIAGNOSIS — H3400 Transient retinal artery occlusion, unspecified eye: Secondary | ICD-10-CM | POA: Diagnosis present

## 2014-05-11 DIAGNOSIS — G453 Amaurosis fugax: Secondary | ICD-10-CM

## 2014-05-18 ENCOUNTER — Other Ambulatory Visit (HOSPITAL_COMMUNITY): Payer: Commercial Managed Care - HMO

## 2014-05-25 ENCOUNTER — Other Ambulatory Visit (HOSPITAL_COMMUNITY): Payer: Self-pay | Admitting: Family Medicine

## 2014-05-25 ENCOUNTER — Ambulatory Visit (HOSPITAL_COMMUNITY): Payer: Medicare HMO | Attending: Cardiovascular Disease | Admitting: Radiology

## 2014-05-25 DIAGNOSIS — G453 Amaurosis fugax: Secondary | ICD-10-CM

## 2014-05-25 DIAGNOSIS — H3400 Transient retinal artery occlusion, unspecified eye: Secondary | ICD-10-CM | POA: Insufficient documentation

## 2014-05-25 NOTE — Progress Notes (Signed)
Echocardiogram performed.  

## 2014-06-02 ENCOUNTER — Encounter: Payer: Self-pay | Admitting: Neurology

## 2014-06-02 ENCOUNTER — Encounter: Payer: Self-pay | Admitting: *Deleted

## 2014-06-07 ENCOUNTER — Encounter: Payer: Self-pay | Admitting: Neurology

## 2014-06-07 ENCOUNTER — Ambulatory Visit (INDEPENDENT_AMBULATORY_CARE_PROVIDER_SITE_OTHER): Payer: Commercial Managed Care - HMO | Admitting: Neurology

## 2014-06-07 VITALS — BP 124/62 | HR 74 | Ht 64.0 in | Wt 136.0 lb

## 2014-06-07 DIAGNOSIS — H3400 Transient retinal artery occlusion, unspecified eye: Secondary | ICD-10-CM | POA: Diagnosis not present

## 2014-06-07 DIAGNOSIS — R51 Headache: Secondary | ICD-10-CM | POA: Diagnosis not present

## 2014-06-07 DIAGNOSIS — R002 Palpitations: Secondary | ICD-10-CM

## 2014-06-07 DIAGNOSIS — G453 Amaurosis fugax: Secondary | ICD-10-CM

## 2014-06-07 NOTE — Patient Instructions (Addendum)
1.  Take one baby aspirin (81mg ) daily. 2.  We will check MRI of the brain and MRA of the head June 25 at 11:45 am [facility]  3.  We will get 24 hour holter monitor for palpitations. 4.  I will get blood work results from Dr. Dema Severin. 5.  I will get notes from Dr. Katy Fitch 6.  Follow up in 4 weeks.

## 2014-06-07 NOTE — Progress Notes (Signed)
NEUROLOGY CONSULTATION NOTE  METTIE ROYLANCE MRN: [id number] DOB: 1941/06/29  Referring provider: Dr. Dema Severin Primary care provider: Dr. Dema Severin  Reason for consult:  Recurrent transient visual loss  HISTORY OF PRESENT ILLNESS: Donna Baldwin is a 73 year old right-handed woman with hypertension, thrombocytopenia, peripheral neuropathy, hyperlipidemia, vitamin D deficiency and aortic atherosclerosis who presents for amaurosis fugax.  Records and images personally reviewed.  On 04/18/14, she was sitting at the computer when she suddenly noted bright shimmering light in the temporal aspect of her left eye, which turned to the black. This lasted 5-10 minutes. This was associated with a dull left sided throbbing headache. She saw her primary care provider who thought she may have had a migraine. The patient does endorse having at least 2 severe migraines in her entire life, but having occurred many years ago. For the next week, she would have brief episodes of transient dimming of her vision, occurring in both eyes. A week later, she developed episode of shimmering light in the right eye. This lasted about 3 minutes and was not associated with headache. Despite no traditional headache, she does have some  pain in the head. Her last episode occurred on 05/19/14, occurring on though left I again. She was evaluated by an ophthalmologist, Dr. Katy Fitch, on 05/05/14, who was concerned about an ischemic event.  Carotid Dopplers were performed on 05/12/14, which revealed less than 40% bilateral internal carotid artery stenosis. A 2-D echocardiogram was performed on 05/25/14, which revealed a left ventricular ejection fraction of 55-60%. She was advised to start taking aspirin 81 mg daily, but she sometimes misses doses. She does have history of hyperlipidemia, but is currently not on a statin to 2 side effects such as muscle cramps in joint pain.    She also reports palpitations.  Incidentally, she has a  history of an idiopathic peripheral neuropathy.  She has numbness, tingling and dysesthesias in the feet.  It has reportedly been diagnosed via NCV-EMG in the past, but workup did not reveal an etiology.  PAST MEDICAL HISTORY: Past Medical History  Diagnosis Date  . Hypertension   . Back pain   . Anemia   . Thrombocytopenia   . HTN (hypertension) 12/03/2013    CT scan-no pheochromocytoma  . GERD (gastroesophageal reflux disease)   . Anxiety   . Vitamin D deficiency     PAST SURGICAL HISTORY: Past Surgical History  Procedure Laterality Date  . Shoulder surgery Bilateral   . Tonsillectomy      MEDICATIONS: Current Outpatient Prescriptions on File Prior to Visit  Medication Sig Dispense Refill  . aspirin 81 MG tablet Take 81 mg by mouth daily.      [name] docusate sodium (COLACE) 100 MG capsule Take 100 mg by mouth as needed.       . ergocalciferol (VITAMIN D2) 50000 UNITS capsule Take 50,000 Units by mouth once a week.      . Homeopathic Products (CALENDULA) GEL Apply topically.      [name] labetalol (NORMODYNE) 200 MG tablet Take 100 mg by mouth once.       [name] Propylene Glycol (SYSTANE BALANCE) 0.6 % SOLN Apply to eye.       No current facility-administered medications on file prior to visit.    ALLERGIES: Allergies  Allergen Reactions  . Beef-Derived Products Anaphylaxis  . Codeine Hives and Anxiety  . Latex Rash  . Penicillins Hives  . Acetaminophen     Tremor, anxiety  . Barbiturates  excitement  . Bee Pollen   . Bee Venom Hives  . Celebrex [Celecoxib]     Unknown  . Diovan [Valsartan] Nausea Only  . Ivp Dye [Iodinated Diagnostic Agents]     Pt states can take if Benadryl given first and dye is not fish based  . Lanolin Itching    Unknown  . Mobic [Meloxicam]     Unknown  . Morphine And Related     Tremors, increased heart rate, excitement, confusion per pt  . Nutritional Supplements     Nuts, strawberries, bicarbonate of soda,  . Other     NARCOTICS  ANALGESICS-TREMORS, INCREASED HEART RATE    . Oxycodone   . Petroleum Jelly [Petrolatum]   . Promethazine-Dm     "Felt funny"  . Shellfish Allergy Hives  . Soy Allergy   . Strawberry   . Tape     Red blotches  . Tylox [Oxycodone-Acetaminophen]   . Epinephrine Palpitations  . Nitrates, Organic Rash    Chest tightness  . Sulfa Antibiotics Hives and Rash    FAMILY HISTORY: Family History  Problem Relation Age of Onset  . Cancer Father     SOCIAL HISTORY: History   Social History  . Marital Status: Married    Spouse Name: N/A    Number of Children: N/A  . Years of Education: N/A   Occupational History  . Not on file.   Social History Main Topics  . Smoking status: Never Smoker   . Smokeless tobacco: Never Used  . Alcohol Use: No  . Drug Use: No  . Sexual Activity: Not on file   Other Topics Concern  . Not on file   Social History Narrative  . No narrative on file    REVIEW OF SYSTEMS: Constitutional: No fevers, chills, or sweats, no generalized fatigue, change in appetite Eyes: No visual changes, double vision, eye pain Ear, nose and throat: No hearing loss, ear pain, nasal congestion, sore throat Cardiovascular: No chest pain, palpitations Respiratory:  No shortness of breath at rest or with exertion, wheezes GastrointestinaI: No nausea, vomiting, diarrhea, abdominal pain, fecal incontinence Genitourinary:  No dysuria, urinary retention or frequency Musculoskeletal:  No neck pain, back pain Integumentary: No rash, pruritus, skin lesions Neurological: as above Psychiatric: No depression, insomnia, anxiety Endocrine: No palpitations, fatigue, diaphoresis, mood swings, change in appetite, change in weight, increased thirst Hematologic/Lymphatic:  No anemia, purpura, petechiae. Allergic/Immunologic: no itchy/runny eyes, nasal congestion, recent allergic reactions, rashes  PHYSICAL EXAM: Filed Vitals:   06/07/14 1014  BP: 124/62  Pulse: 74   General:  No acute distress Head:  Normocephalic/atraumatic Neck: supple, no paraspinal tenderness, full range of motion Back: No paraspinal tenderness Heart: regular rate and rhythm Lungs: Clear to auscultation bilaterally. Vascular: No carotid bruits. Neurological Exam: Mental status: alert and oriented to person, place, and time, recent and remote memory intact, fund of knowledge intact, attention and concentration intact, speech fluent and not dysarthric, language intact. Cranial nerves: CN I: not tested CN II: pupils equal, round and reactive to light, visual fields intact, fundi unremarkable, without vessel changes, exudates, hemorrhages or papilledema. CN III, IV, VI:  full range of motion, no nystagmus, no ptosis CN V: facial sensation intact CN VII: upper and lower face symmetric CN VIII: hearing intact CN IX, X: gag intact, uvula midline CN XI: sternocleidomastoid and trapezius muscles intact CN XII: tongue midline Bulk & Tone: normal, no fasciculations. Motor: 5/5 throughout Sensation: Pinprick intact. Reduced vibration sensation in the toes.  Deep Tendon Reflexes: 1+ throughout, toes downgoing Finger to nose testing: No dysmetria Heel to shin: No dysmetria Gait: Normal station and stride. Does exhibit some difficulty with tandem walking. Romberg positive.  IMPRESSION: 1.  Recurrent transient vision loss involving both eyes.  Given the bilateral nature, it would less likely be due to focal stenosis (ruled out via carotid dopplers anyway).  Remote history of migraine and positive symptomatology may suggest possible migraine etiology.  Other possibility would be ischemic optic neuropathy.  I am not sure if Dr. Katy Fitch suspected this as well as I do not have his notes.  Temporal arteritis should be ruled out as well.  Her PCP did order sed rate and other labs, but the results are not available to me.  2. Palpitations.  PLAN: 1.  MRI/MRA head 2.  Will get labs from PCP, including sed  rate.  Should also check fasting lipid panel as LDL should be less than 100. 3.  Given palpitations and possibility of cardioembolic event, given the bilateral symptoms, will get 24 hour holter 4.  Will get notes from Dr. Katy Fitch. 5.  Instructed to take ASA 81mg  daily 6.  Follow up in 4 weeks.  45 minutes spent with patient, over 50% spent counseling and coordinating care.  Thank you for allowing me to take part in the care of this patient.  Metta Clines, DO  CC:  Harlan Stains, MD

## 2014-06-17 ENCOUNTER — Telehealth: Payer: Self-pay | Admitting: Neurology

## 2014-06-17 NOTE — Telephone Encounter (Signed)
Left message on machine for patient to call back.

## 2014-06-17 NOTE — Telephone Encounter (Signed)
Message copied by Annamaria Helling on Fri Jun 17, 2014  1:16 PM ------      Message from: JAFFE, ADAM R      Created: Fri Jun 17, 2014 12:44 PM       I looked at Donna Baldwin's labs.  Her cholesterol is elevated.  Her LDL is 199 and should really be less than 100.  This should be discussed with her PCP ------

## 2014-06-17 NOTE — Telephone Encounter (Signed)
CB [id number], patient returning call / Sherri

## 2014-06-17 NOTE — Telephone Encounter (Signed)
Patient made aware. She will follow up with Dr Harlan Stains. Labs results faxed.

## 2014-06-20 ENCOUNTER — Telehealth: Payer: Self-pay | Admitting: Neurology

## 2014-06-20 NOTE — Telephone Encounter (Signed)
Pt needs to talk to someone about test please call

## 2014-06-20 NOTE — Telephone Encounter (Signed)
Patient clarified that no labs were ordered by Dr Tomi Likens  Holter Monitor will be put on patient  06/21/14

## 2014-06-21 ENCOUNTER — Encounter (INDEPENDENT_AMBULATORY_CARE_PROVIDER_SITE_OTHER): Payer: Medicare HMO

## 2014-06-21 ENCOUNTER — Encounter: Payer: Self-pay | Admitting: *Deleted

## 2014-06-21 DIAGNOSIS — R002 Palpitations: Secondary | ICD-10-CM

## 2014-06-21 DIAGNOSIS — R51 Headache: Secondary | ICD-10-CM

## 2014-06-21 DIAGNOSIS — G453 Amaurosis fugax: Secondary | ICD-10-CM

## 2014-06-21 NOTE — Progress Notes (Signed)
Patient ID: Donna Baldwin, female   DOB: April 24, 1941, 73 y.o.   MRN: [id number] [facility] 24 hour holter monitor applied to patient.

## 2014-06-23 ENCOUNTER — Ambulatory Visit (HOSPITAL_COMMUNITY)
Admission: RE | Admit: 2014-06-23 | Discharge: 2014-06-23 | Disposition: A | Discharge status: Home / Self Care | Payer: Medicare HMO | Source: Ambulatory Visit | Attending: Neurology | Admitting: Neurology

## 2014-06-23 DIAGNOSIS — I6789 Other cerebrovascular disease: Secondary | ICD-10-CM | POA: Insufficient documentation

## 2014-06-23 DIAGNOSIS — R002 Palpitations: Secondary | ICD-10-CM | POA: Diagnosis not present

## 2014-06-23 DIAGNOSIS — G453 Amaurosis fugax: Secondary | ICD-10-CM

## 2014-06-23 DIAGNOSIS — R51 Headache: Secondary | ICD-10-CM | POA: Insufficient documentation

## 2014-06-24 ENCOUNTER — Telehealth: Payer: Self-pay | Admitting: *Deleted

## 2014-06-24 NOTE — Telephone Encounter (Signed)
Message copied by Claudie Revering on Fri Jun 24, 2014  1:39 PM ------      Message from: JAFFE, ADAM R      Created: Fri Jun 24, 2014  6:45 AM       MRI does not reveal any strokes.       ----- Message -----         From: Rad Results In Interface         Sent: 06/23/2014   1:40 PM           To: Dudley Major, DO                   ------

## 2014-06-24 NOTE — Telephone Encounter (Signed)
Patient is aware of normal MRI and MRA

## 2014-07-07 ENCOUNTER — Encounter: Payer: Self-pay | Admitting: Neurology

## 2014-07-07 ENCOUNTER — Ambulatory Visit (INDEPENDENT_AMBULATORY_CARE_PROVIDER_SITE_OTHER): Payer: Medicare HMO | Admitting: Neurology

## 2014-07-07 VITALS — BP 104/68 | HR 70 | Temp 98.2°F | Resp 18 | Ht 64.0 in | Wt 139.3 lb

## 2014-07-07 DIAGNOSIS — H3403 Transient retinal artery occlusion, bilateral: Secondary | ICD-10-CM | POA: Insufficient documentation

## 2014-07-07 DIAGNOSIS — H3400 Transient retinal artery occlusion, unspecified eye: Secondary | ICD-10-CM

## 2014-07-07 DIAGNOSIS — G609 Hereditary and idiopathic neuropathy, unspecified: Secondary | ICD-10-CM | POA: Insufficient documentation

## 2014-07-07 NOTE — Patient Instructions (Signed)
1.  Take aspirin 81mg  daily 2.  Check another fasting lipid panel.  It would be ideal for you to be on cholesterol medication 3.  Mediterranean diet 4.  Follow up in 3 months.

## 2014-07-07 NOTE — Progress Notes (Signed)
NEUROLOGY FOLLOW UP OFFICE NOTE  Donna Baldwin [id number]  HISTORY OF PRESENT ILLNESS: Donna Baldwin is a 73 year old right-handed woman with hypertension, thrombocytopenia, peripheral neuropathy, hyperlipidemia, vitamin D deficiency and aortic atherosclerosis who follows up for recurrent binocular transient vision loss.  Records and images personally reviewed.  UPDATE: 11/11/13 LABS: LDL 199 05/09/14 LABS:  Sed Rate 18, CRP <0.1 06/23/14 MRI BRAIN WO:  small vessel ischemic changes.  No acute infarcts. 06/23/14 MRA HEAD WO:  unremarkable. Holter monitor from 06/21/14-06/22/14 revealed very infrequent PACs, but no a fib or a flutter.  HISTORY: On 04/18/14, she was sitting at the computer when she suddenly noted bright shimmering light in the temporal aspect of her left eye, which turned to the black. This lasted 5-10 minutes. This was associated with a dull left sided throbbing headache. She saw her primary care provider who thought she may have had a migraine. The patient does endorse having at least 2 severe migraines in her entire life, but having occurred many years ago. For the next week, she would have brief episodes of transient dimming of her vision. A week later, she developed episode of shimmering light in the right eye. This lasted about 3 minutes and was not associated with headache. Despite no traditional headache, she does have some pain in the head. Her last episode occurred on 05/19/14, occurring on though left I again. She was evaluated by an ophthalmologist, Dr. Katy Fitch, on 05/05/14, who was concerned about an ischemic event.  Carotid Dopplers were performed on 05/12/14, which revealed less than 40% bilateral internal carotid artery stenosis. A 2-D echocardiogram was performed on 05/25/14, which revealed a left ventricular ejection fraction of 55-60%. She was advised to start taking aspirin 81 mg daily, but she sometimes misses doses. She does have history of hyperlipidemia, but  is currently not on a statin to 2 side effects such as muscle cramps in joint pain.    She also reports palpitations.  Incidentally, she has a history of an idiopathic peripheral neuropathy.  She has numbness, tingling and dysesthesias in the feet.  It has reportedly been diagnosed via NCV-EMG in the past, but workup did not reveal an etiology.  PAST MEDICAL HISTORY: Past Medical History  Diagnosis Date  . Hypertension   . Back pain   . Anemia   . Thrombocytopenia   . HTN (hypertension) 12/03/2013    CT scan-no pheochromocytoma  . GERD (gastroesophageal reflux disease)   . Anxiety   . Vitamin D deficiency     MEDICATIONS: Current Outpatient Prescriptions on File Prior to Visit  Medication Sig Dispense Refill  . amLODipine (NORVASC) 2.5 MG tablet Take 2.5 mg by mouth daily.      [name] aspirin 81 MG tablet Take 81 mg by mouth daily.      . cetirizine (ZYRTEC) 10 MG tablet Take 10 mg by mouth daily.      . Diphenhydramine-Zinc Acetate (BENADRYL ITCH RELIEF EX) Apply topically.      . docusate sodium (COLACE) 100 MG capsule Take 100 mg by mouth as needed.       [name] EPINEPHrine (EPIPEN IJ) Inject as directed as needed.      . ergocalciferol (VITAMIN D2) 50000 UNITS capsule Take 50,000 Units by mouth once a week.      . Homeopathic Products (CALENDULA) GEL Apply topically.      [name] labetalol (NORMODYNE) 200 MG tablet Take 100 mg by mouth once.       [name] Propylene  Glycol (SYSTANE BALANCE) 0.6 % SOLN Apply to eye.      . triamcinolone cream (KENALOG) 0.5 % Apply 1 application topically as needed.       No current facility-administered medications on file prior to visit.    ALLERGIES: Allergies  Allergen Reactions  . Beef-Derived Products Anaphylaxis  . Codeine Hives and Anxiety  . Latex Rash  . Penicillins Hives  . Acetaminophen     Tremor, anxiety  . Barbiturates     excitement  . Bee Pollen   . Bee Venom Hives  . Celebrex [Celecoxib]     Unknown  . Diovan [Valsartan] Nausea Only    . Ivp Dye [Iodinated Diagnostic Agents]     Pt states can take if Benadryl given first and dye is not fish based  . Lanolin Itching    Unknown  . Mobic [Meloxicam]     Unknown  . Morphine And Related     Tremors, increased heart rate, excitement, confusion per pt  . Nutritional Supplements     Nuts, strawberries, bicarbonate of soda,  . Other     NARCOTICS ANALGESICS-TREMORS, INCREASED HEART RATE    . Oxycodone   . Petroleum Jelly [Petrolatum]   . Promethazine-Dm     "Felt funny"  . Shellfish Allergy Hives  . Soy Allergy   . Strawberry   . Tape     Red blotches  . Tylox [Oxycodone-Acetaminophen]   . Epinephrine Palpitations  . Nitrates, Organic Rash    Chest tightness  . Sulfa Antibiotics Hives and Rash    FAMILY HISTORY: Family History  Problem Relation Age of Onset  . Cancer Father     SOCIAL HISTORY: History   Social History  . Marital Status: Married    Spouse Name: N/A    Number of Children: N/A  . Years of Education: N/A   Occupational History  . Not on file.   Social History Main Topics  . Smoking status: Never Smoker   . Smokeless tobacco: Never Used  . Alcohol Use: No  . Drug Use: No  . Sexual Activity: Not on file   Other Topics Concern  . Not on file   Social History Narrative  . No narrative on file    REVIEW OF SYSTEMS: Constitutional: No fevers, chills, or sweats, no generalized fatigue, change in appetite Eyes: No visual changes, double vision, eye pain Ear, nose and throat: No hearing loss, ear pain, nasal congestion, sore throat Cardiovascular: No chest pain, palpitations Respiratory:  No shortness of breath at rest or with exertion, wheezes GastrointestinaI: No nausea, vomiting, diarrhea, abdominal pain, fecal incontinence Genitourinary:  No dysuria, urinary retention or frequency Musculoskeletal:  No neck pain, back pain Integumentary: No rash, pruritus, skin lesions Neurological: as above Psychiatric: No depression,  insomnia, anxiety Endocrine: No palpitations, fatigue, diaphoresis, mood swings, change in appetite, change in weight, increased thirst Hematologic/Lymphatic:  No anemia, purpura, petechiae. Allergic/Immunologic: no itchy/runny eyes, nasal congestion, recent allergic reactions, rashes  PHYSICAL EXAM: Filed Vitals:   07/07/14 0957  BP: 104/68  Pulse: 70  Temp: 98.2 F (36.8 C)  Resp: 18   General: No acute distress Head:  Normocephalic/atraumatic Neck: supple, no paraspinal tenderness, full range of motion Heart:  Regular rate and rhythm Lungs:  Clear to auscultation bilaterally Back: No paraspinal tenderness Neurological Exam: alert and oriented to person, place, and time. Attention span and concentration intact, recent and remote memory intact, fund of knowledge intact.  Speech fluent and not dysarthric, language intact.  CN II-XII intact. Fundoscopic exam unremarkable without vessel changes, exudates, hemorrhages or papilledema.  Bulk and tone normal, muscle strength 5/5 throughout.  Deep tendon reflexes 1+ throughout except absent in the ankles, toes downgoing.  Finger to nose testing intact.  Gait normal.  IMPRESSION: Transient retinal artery occlusion, bilateral Peripheral neuropathy.  PLAN: I would continue ASA 81mg  daily just as a precaution for cerebrovascular disease.  I have no other recommendations at this time. Recheck fasting lipid panel.  If possible, would recommend statin therapy (LDL was 199 in November).  Unfortunately she is not tolerable. LDL goal should be less than 100. Mediterranean diet Follow up in 3 months.  Metta Clines, DO  CC:  Harlan Stains, MD

## 2014-07-13 ENCOUNTER — Telehealth: Payer: Self-pay | Admitting: *Deleted

## 2014-07-13 LAB — LIPID PANEL
CHOLESTEROL: 272 mg/dL — AB (ref 0–200)
HDL: 82 mg/dL (ref >39)
LDL Cholesterol: 176 mg/dL — ABNORMAL HIGH (ref 0–99)
Total CHOL/HDL Ratio: 3.3 Ratio
Triglycerides: 71 mg/dL (ref <150)
VLDL: 14 mg/dL (ref 0–40)

## 2014-07-13 NOTE — Telephone Encounter (Signed)
Message copied by Claudie Revering on Wed Jul 13, 2014  8:12 AM ------      Message from: JAFFE, ADAM R      Created: Wed Jul 13, 2014  6:46 AM       Fasting lipid panel revealed an LDL of 176.  It should ideally be less than 100.  I know that she has had myalgias to statins in the past, but she should be aware of this value and possibly discuss with PCP regarding other options if any.      ----- Message -----         From: Lab in [facility] Interface         Sent: 07/13/2014   1:06 AM           To: Dudley Major, DO                   ------

## 2014-07-13 NOTE — Telephone Encounter (Signed)
Called patient did not leave message

## 2014-07-15 ENCOUNTER — Telehealth: Payer: Self-pay | Admitting: *Deleted

## 2014-07-15 NOTE — Telephone Encounter (Signed)
I have tried to call patient several times to give lab results however did not get an answer

## 2014-10-06 ENCOUNTER — Encounter: Payer: Self-pay | Admitting: Neurology

## 2014-10-06 ENCOUNTER — Ambulatory Visit (INDEPENDENT_AMBULATORY_CARE_PROVIDER_SITE_OTHER): Payer: Commercial Managed Care - HMO | Admitting: Neurology

## 2014-10-06 VITALS — BP 130/70 | HR 57 | Ht 64.0 in | Wt 138.7 lb

## 2014-10-06 DIAGNOSIS — G43109 Migraine with aura, not intractable, without status migrainosus: Secondary | ICD-10-CM

## 2014-10-06 DIAGNOSIS — G629 Polyneuropathy, unspecified: Secondary | ICD-10-CM

## 2014-10-06 DIAGNOSIS — E785 Hyperlipidemia, unspecified: Secondary | ICD-10-CM

## 2014-10-06 DIAGNOSIS — G43809 Other migraine, not intractable, without status migrainosus: Secondary | ICD-10-CM

## 2014-10-06 NOTE — Progress Notes (Signed)
NEUROLOGY FOLLOW UP OFFICE NOTE  Donna Baldwin [id number]  HISTORY OF PRESENT ILLNESS: Donna Baldwin is a 73 year old right-handed woman with hypertension, thrombocytopenia, peripheral neuropathy, hyperlipidemia, vitamin D deficiency and aortic atherosclerosis who follows up for recurrent binocular transient vision loss.  Records and images personally reviewed.   UPDATE: She reports three more ocular events, described as a shimmering light in the left eye with partial vision loss lasting 15 minutes.  There is no associated headache.  It is monocular.  07/12/14 LDL 176.  HISTORY: On 04/18/14, she was sitting at the computer when she suddenly noted bright shimmering light in the temporal aspect of her left eye, which turned to the black. This lasted 5-10 minutes. This was associated with a dull left sided throbbing headache. She saw her primary care provider who thought she may have had a migraine. The patient does endorse having at least 2 severe migraines in her entire life, but having occurred many years ago. For the next week, she would have brief episodes of transient dimming of her vision. A week later, she developed episode of shimmering light in the right eye. This lasted about 3 minutes and was not associated with headache. Despite no traditional headache, she does have some pain in the head. Her last episode occurred on 05/19/14, occurring on though left I again. She was evaluated by an ophthalmologist, Dr. Katy Fitch, on 05/05/14, who was concerned about an ischemic event.  Carotid Dopplers were performed on 05/12/14, which revealed less than 40% bilateral internal carotid artery stenosis. A 2-D echocardiogram was performed on 05/25/14, which revealed a left ventricular ejection fraction of 55-60%. She was advised to start taking aspirin 81 mg daily, but she sometimes misses doses. She does have history of hyperlipidemia, but is currently not on a statin to 2 side effects such as muscle  cramps in joint pain.    05/09/14 LABS:  Sed Rate 18, CRP <0.1 06/23/14 MRI BRAIN WO:  small vessel ischemic changes.  No acute infarcts. 06/23/14 MRA HEAD WO:  unremarkable. Holter monitor from 06/21/14-06/22/14 revealed very infrequent PACs, but no a fib or a flutter.  Incidentally, she has a history of an idiopathic peripheral neuropathy.  She has numbness, tingling and dysesthesias in the feet.  It has reportedly been diagnosed via NCV-EMG in the past, but workup did not reveal an etiology.  She has chronic left leg pain with tingling over the foot.  No back pain.  It is like a "toothache" in her shin and behind her lower thigh.  She reportedly had prior MRI of the lumbar spine, which was unremarkable.  PAST MEDICAL HISTORY: Past Medical History  Diagnosis Date  . Hypertension   . Back pain   . Anemia   . Thrombocytopenia   . HTN (hypertension) 12/03/2013    CT scan-no pheochromocytoma  . GERD (gastroesophageal reflux disease)   . Anxiety   . Vitamin D deficiency     MEDICATIONS: Current Outpatient Prescriptions on File Prior to Visit  Medication Sig Dispense Refill  . amLODipine (NORVASC) 2.5 MG tablet Take 2.5 mg by mouth daily.      [name] aspirin 81 MG tablet Take 81 mg by mouth daily.      . cetirizine (ZYRTEC) 10 MG tablet Take 10 mg by mouth daily.      . Diphenhydramine-Zinc Acetate (BENADRYL ITCH RELIEF EX) Apply topically.      . docusate sodium (COLACE) 100 MG capsule Take 100 mg by mouth as needed.       [name]  EPINEPHrine (EPIPEN IJ) Inject as directed as needed.      . ergocalciferol (VITAMIN D2) 50000 UNITS capsule Take 50,000 Units by mouth once a week.      . Homeopathic Products (CALENDULA) GEL Apply topically.      [name] labetalol (NORMODYNE) 200 MG tablet Take 100 mg by mouth once.       [name] Propylene Glycol (SYSTANE BALANCE) 0.6 % SOLN Apply to eye.      . triamcinolone cream (KENALOG) 0.5 % Apply 1 application topically as needed.       No current facility-administered  medications on file prior to visit.    ALLERGIES: Allergies  Allergen Reactions  . Beef-Derived Products Anaphylaxis  . Codeine Hives and Anxiety  . Latex Rash  . Penicillins Hives  . Acetaminophen     Tremor, anxiety  . Barbiturates     excitement  . Bee Pollen   . Bee Venom Hives  . Celebrex [Celecoxib]     Unknown  . Diovan [Valsartan] Nausea Only  . Ivp Dye [Iodinated Diagnostic Agents]     Pt states can take if Benadryl given first and dye is not fish based  . Lanolin Itching    Unknown  . Mobic [Meloxicam]     Unknown  . Morphine And Related     Tremors, increased heart rate, excitement, confusion per pt  . Nutritional Supplements     Nuts, strawberries, bicarbonate of soda,  . Other     NARCOTICS ANALGESICS-TREMORS, INCREASED HEART RATE    . Oxycodone   . Petroleum Jelly [Petrolatum]   . Promethazine-Dm     "Felt funny"  . Shellfish Allergy Hives  . Soy Allergy   . Strawberry   . Tape     Red blotches  . Tylox [Oxycodone-Acetaminophen]   . Epinephrine Palpitations  . Nitrates, Organic Rash    Chest tightness  . Sulfa Antibiotics Hives and Rash    FAMILY HISTORY: Family History  Problem Relation Age of Onset  . Cancer Father     SOCIAL HISTORY: History   Social History  . Marital Status: Married    Spouse Name: N/A    Number of Children: N/A  . Years of Education: N/A   Occupational History  . Not on file.   Social History Main Topics  . Smoking status: Never Smoker   . Smokeless tobacco: Never Used  . Alcohol Use: No  . Drug Use: No  . Sexual Activity: Not on file   Other Topics Concern  . Not on file   Social History Narrative  . No narrative on file    REVIEW OF SYSTEMS: Constitutional: No fevers, chills, or sweats, no generalized fatigue, change in appetite Eyes: As above Ear, nose and throat: No hearing loss, ear pain, nasal congestion, sore throat Cardiovascular: No chest pain, palpitations Respiratory:  No shortness of  breath at rest or with exertion, wheezes GastrointestinaI: No nausea, vomiting, diarrhea, abdominal pain, fecal incontinence Genitourinary:  No dysuria, urinary retention or frequency Musculoskeletal:  Left leg pain Integumentary: No rash, pruritus, skin lesions Neurological: as above Psychiatric: No depression, insomnia, anxiety Endocrine: No palpitations, fatigue, diaphoresis, mood swings, change in appetite, change in weight, increased thirst Hematologic/Lymphatic:  No anemia, purpura, petechiae. Allergic/Immunologic: no itchy/runny eyes, nasal congestion, recent allergic reactions, rashes  PHYSICAL EXAM: Filed Vitals:   10/06/14 0954  BP: 130/70  Pulse: 57   General: No acute distress Head:  Normocephalic/atraumatic Neck: supple, no paraspinal tenderness, full range of motion  Heart:  Regular rate and rhythm Lungs:  Clear to auscultation bilaterally Back: No paraspinal tenderness Neurological Exam: alert and oriented to person, place, and time. Attention span and concentration intact, recent and remote memory intact, fund of knowledge intact.  Speech fluent and not dysarthric, language intact.  CN II-XII intact. Fundoscopic exam unremarkable without vessel changes, exudates, hemorrhages or papilledema.  Bulk and tone normal, muscle strength 5/5 throughout.  Reduced pinprick and vibration in the feet.  Deep tendon reflexes 1+ throughout except absent in the ankles, toes downgoing.  Finger to nose  testing intact.  Gait normal.  IMPRESSION: Probable ocular migraine, given that is recurrent and monocular.  Work up is negative. Hyperlipidemia  PLAN: 1.  Will continue to monitor.  If it becomes more prevalent, she can get her eyes re-examined and/or start a migraine preventative 2.  Consider statin medication if medically indicated (not for secondary stroke prevention).  Fluvastatin may cause less muscle spasms. 3.  Follow up in 6 months  Metta Clines, DO  CC:  Harlan Stains,  MD

## 2014-10-06 NOTE — Patient Instructions (Addendum)
Continue to monitor for now.  If vision becomes more frequent problem, get eyes checked again and if unremarkable, then call and we can start a migraine medication. Since your cholesterol is high, you may want to start a medication.  Discuss with Dr. Dema Severin Follow up in 6 months.

## 2014-10-07 NOTE — Progress Notes (Signed)
Done

## 2014-11-30 ENCOUNTER — Other Ambulatory Visit: Payer: Self-pay | Admitting: Gastroenterology

## 2014-12-28 ENCOUNTER — Ambulatory Visit (INDEPENDENT_AMBULATORY_CARE_PROVIDER_SITE_OTHER): Payer: Commercial Managed Care - HMO | Admitting: Neurology

## 2014-12-28 ENCOUNTER — Encounter: Payer: Self-pay | Admitting: Neurology

## 2014-12-28 VITALS — BP 140/78 | HR 70 | Temp 97.7°F | Resp 16 | Ht 64.0 in | Wt 139.0 lb

## 2014-12-28 DIAGNOSIS — F411 Generalized anxiety disorder: Secondary | ICD-10-CM

## 2014-12-28 DIAGNOSIS — G43109 Migraine with aura, not intractable, without status migrainosus: Secondary | ICD-10-CM

## 2014-12-28 DIAGNOSIS — G43809 Other migraine, not intractable, without status migrainosus: Secondary | ICD-10-CM

## 2014-12-28 DIAGNOSIS — G43119 Migraine with aura, intractable, without status migrainosus: Secondary | ICD-10-CM

## 2014-12-28 MED ORDER — PREDNISONE 10 MG PO TABS: ORAL_TABLET | ORAL | Status: DC
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
## 2014-12-29 NOTE — Progress Notes (Signed)
Note faxed.

## 2015-01-11 ENCOUNTER — Ambulatory Visit (HOSPITAL_COMMUNITY): Admission: RE | Admit: 2015-01-11 | Payer: Commercial Managed Care - HMO | Source: Ambulatory Visit

## 2015-01-11 ENCOUNTER — Other Ambulatory Visit: Payer: Self-pay | Admitting: Neurology

## 2015-01-11 ENCOUNTER — Ambulatory Visit (HOSPITAL_COMMUNITY)
Admission: RE | Admit: 2015-01-11 | Discharge: 2015-01-11 | Disposition: A | Discharge status: Home / Self Care | Payer: Commercial Managed Care - HMO | Source: Ambulatory Visit | Attending: Neurology | Admitting: Neurology

## 2015-01-11 DIAGNOSIS — F411 Generalized anxiety disorder: Secondary | ICD-10-CM

## 2015-01-11 DIAGNOSIS — I6501 Occlusion and stenosis of right vertebral artery: Secondary | ICD-10-CM | POA: Diagnosis not present

## 2015-01-11 DIAGNOSIS — G43119 Migraine with aura, intractable, without status migrainosus: Secondary | ICD-10-CM

## 2015-01-11 DIAGNOSIS — H547 Unspecified visual loss: Secondary | ICD-10-CM | POA: Insufficient documentation

## 2015-01-11 DIAGNOSIS — G43809 Other migraine, not intractable, without status migrainosus: Secondary | ICD-10-CM | POA: Insufficient documentation

## 2015-01-11 DIAGNOSIS — G43109 Migraine with aura, not intractable, without status migrainosus: Secondary | ICD-10-CM

## 2015-01-11 LAB — POCT I-STAT CREATININE: CREATININE: 1.1 mg/dL (ref 0.50–1.10)

## 2015-01-11 MED ORDER — GADOBENATE DIMEGLUMINE 529 MG/ML IV SOLN
13.0000 mL | Freq: Once | INTRAVENOUS | Status: AC | PRN
Start: 2015-01-11 — End: 2015-01-11
  Administered 2015-01-11: 12 mL via INTRAVENOUS

## 2015-01-12 ENCOUNTER — Telehealth: Payer: Self-pay | Admitting: Neurology

## 2015-01-12 NOTE — Telephone Encounter (Signed)
Cross-cover note for Dr. Tomi Likens  I attempted to contact patient via phone today regarding the results of MRI/A brain and MRA carotids, however there was no answer so a message was left for the patient to return my call.   Aadan Chenier K. Posey Pronto, DO

## 2015-01-16 NOTE — Telephone Encounter (Signed)
Patient is aware of  MRI MRA and was advised to continue aspirin . I have mailed a copy of the results to her per her request

## 2015-01-16 NOTE — Telephone Encounter (Signed)
MRI shows no stroke.  There is some blockage seen in one of the vertebral arteries, which is an incidental finding.  There is nothing to do for it except take aspirin, which she already is doing.

## 2015-02-02 ENCOUNTER — Ambulatory Visit: Payer: Commercial Managed Care - HMO | Admitting: Neurology

## 2015-02-02 DIAGNOSIS — G43809 Other migraine, not intractable, without status migrainosus: Secondary | ICD-10-CM | POA: Diagnosis not present

## 2015-02-02 DIAGNOSIS — H5713 Ocular pain, bilateral: Secondary | ICD-10-CM | POA: Diagnosis not present

## 2015-02-02 DIAGNOSIS — H3403 Transient retinal artery occlusion, bilateral: Secondary | ICD-10-CM | POA: Diagnosis not present

## 2015-02-02 DIAGNOSIS — H53123 Transient visual loss, bilateral: Secondary | ICD-10-CM | POA: Diagnosis not present

## 2015-02-02 DIAGNOSIS — H25813 Combined forms of age-related cataract, bilateral: Secondary | ICD-10-CM | POA: Diagnosis not present

## 2015-02-03 ENCOUNTER — Telehealth: Payer: Self-pay | Admitting: Neurology

## 2015-02-03 NOTE — Telephone Encounter (Signed)
Pt no showed 02/02/15 appt w/ Dr. Tomi Likens. Letter not sent to pt as she has an upcoming appt in April / Sherri S.

## 2015-02-21 DIAGNOSIS — E785 Hyperlipidemia, unspecified: Secondary | ICD-10-CM | POA: Diagnosis not present

## 2015-02-21 DIAGNOSIS — I10 Essential (primary) hypertension: Secondary | ICD-10-CM | POA: Diagnosis not present

## 2015-02-21 DIAGNOSIS — G43109 Migraine with aura, not intractable, without status migrainosus: Secondary | ICD-10-CM | POA: Diagnosis not present

## 2015-02-21 DIAGNOSIS — E559 Vitamin D deficiency, unspecified: Secondary | ICD-10-CM | POA: Diagnosis not present

## 2015-03-14 ENCOUNTER — Encounter: Payer: Self-pay | Admitting: Neurology

## 2015-03-14 ENCOUNTER — Ambulatory Visit (INDEPENDENT_AMBULATORY_CARE_PROVIDER_SITE_OTHER): Payer: Commercial Managed Care - HMO | Admitting: Neurology

## 2015-03-14 VITALS — BP 124/70 | HR 70 | Ht 64.0 in | Wt 141.0 lb

## 2015-03-14 DIAGNOSIS — G43109 Migraine with aura, not intractable, without status migrainosus: Secondary | ICD-10-CM | POA: Diagnosis not present

## 2015-03-14 DIAGNOSIS — G43809 Other migraine, not intractable, without status migrainosus: Secondary | ICD-10-CM
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
## 2015-03-14 NOTE — Patient Instructions (Signed)
At this point, I attribute the visual symptoms to migraine.  There is nothing in the brain or blood vessels in the head or neck that would be causing these symptoms.  Continue to use sunglasses as needed.  Limit use of pain relievers to no more than 2 days out of the week.  If headaches worsen or become frequent, call and we can start a preventative medication.  Follow up in 3 months.

## 2015-04-04 DIAGNOSIS — I10 Essential (primary) hypertension: Secondary | ICD-10-CM | POA: Diagnosis not present

## 2015-04-04 DIAGNOSIS — E785 Hyperlipidemia, unspecified: Secondary | ICD-10-CM | POA: Diagnosis not present

## 2015-04-04 DIAGNOSIS — E559 Vitamin D deficiency, unspecified: Secondary | ICD-10-CM | POA: Diagnosis not present

## 2015-04-06 ENCOUNTER — Ambulatory Visit: Payer: Commercial Managed Care - HMO | Admitting: Neurology

## 2015-06-15 DIAGNOSIS — H25813 Combined forms of age-related cataract, bilateral: Secondary | ICD-10-CM | POA: Diagnosis not present

## 2015-06-15 DIAGNOSIS — H04123 Dry eye syndrome of bilateral lacrimal glands: Secondary | ICD-10-CM | POA: Diagnosis not present

## 2015-06-28 ENCOUNTER — Ambulatory Visit: Payer: BC Managed Care – PPO | Admitting: Neurology

## 2015-07-24 ENCOUNTER — Emergency Department (HOSPITAL_COMMUNITY): Payer: Commercial Managed Care - HMO

## 2015-07-24 ENCOUNTER — Encounter (HOSPITAL_COMMUNITY): Payer: Self-pay | Admitting: Emergency Medicine

## 2015-07-24 ENCOUNTER — Emergency Department (HOSPITAL_COMMUNITY)
Admission: EM | Admit: 2015-07-24 | Discharge: 2015-07-24 | Disposition: A | Discharge status: Home / Self Care | Payer: Commercial Managed Care - HMO | Attending: Emergency Medicine | Admitting: Emergency Medicine

## 2015-07-24 DIAGNOSIS — Z79899 Other long term (current) drug therapy: Secondary | ICD-10-CM | POA: Diagnosis not present

## 2015-07-24 DIAGNOSIS — T7840XA Allergy, unspecified, initial encounter: Secondary | ICD-10-CM | POA: Diagnosis not present

## 2015-07-24 DIAGNOSIS — I10 Essential (primary) hypertension: Secondary | ICD-10-CM | POA: Insufficient documentation

## 2015-07-24 DIAGNOSIS — R51 Headache: Secondary | ICD-10-CM | POA: Insufficient documentation

## 2015-07-24 DIAGNOSIS — R220 Localized swelling, mass and lump, head: Secondary | ICD-10-CM | POA: Insufficient documentation

## 2015-07-24 DIAGNOSIS — R202 Paresthesia of skin: Secondary | ICD-10-CM | POA: Insufficient documentation

## 2015-07-24 DIAGNOSIS — Z7982 Long term (current) use of aspirin: Secondary | ICD-10-CM | POA: Insufficient documentation

## 2015-07-24 DIAGNOSIS — Z8639 Personal history of other endocrine, nutritional and metabolic disease: Secondary | ICD-10-CM | POA: Diagnosis not present

## 2015-07-24 DIAGNOSIS — Z862 Personal history of diseases of the blood and blood-forming organs and certain disorders involving the immune mechanism: Secondary | ICD-10-CM | POA: Diagnosis not present

## 2015-07-24 DIAGNOSIS — Z8719 Personal history of other diseases of the digestive system: Secondary | ICD-10-CM | POA: Insufficient documentation

## 2015-07-24 DIAGNOSIS — Z880 Allergy status to penicillin: Secondary | ICD-10-CM | POA: Insufficient documentation

## 2015-07-24 DIAGNOSIS — R0602 Shortness of breath: Secondary | ICD-10-CM | POA: Insufficient documentation

## 2015-07-24 DIAGNOSIS — R42 Dizziness and giddiness: Secondary | ICD-10-CM | POA: Insufficient documentation

## 2015-07-24 DIAGNOSIS — F419 Anxiety disorder, unspecified: Secondary | ICD-10-CM | POA: Diagnosis not present

## 2015-07-24 DIAGNOSIS — Z91040 Latex allergy status: Secondary | ICD-10-CM | POA: Insufficient documentation

## 2015-07-24 DIAGNOSIS — R519 Headache, unspecified: Secondary | ICD-10-CM

## 2015-07-24 LAB — CBC WITH DIFFERENTIAL/PLATELET
Basophils Absolute: 0 10*3/uL (ref 0.0–0.1)
Basophils Relative: 0 % (ref 0–1)
Eosinophils Absolute: 0.1 10*3/uL (ref 0.0–0.7)
Eosinophils Relative: 3 % (ref 0–5)
HCT: 37.9 % (ref 36.0–46.0)
Hemoglobin: 12 g/dL (ref 12.0–15.0)
Lymphocytes Relative: 36 % (ref 12–46)
Lymphs Abs: 1.4 10*3/uL (ref 0.7–4.0)
MCH: 22.5 pg — AB (ref 26.0–34.0)
MCHC: 31.7 g/dL (ref 30.0–36.0)
MCV: 71.1 fL — ABNORMAL LOW (ref 78.0–100.0)
MONO ABS: 0.4 10*3/uL (ref 0.1–1.0)
Monocytes Relative: 11 % (ref 3–12)
NEUTROS ABS: 1.9 10*3/uL (ref 1.7–7.7)
Neutrophils Relative %: 49 % (ref 43–77)
Platelets: 144 10*3/uL — ABNORMAL LOW (ref 150–400)
RBC: 5.33 MIL/uL — ABNORMAL HIGH (ref 3.87–5.11)
RDW: 14.9 % (ref 11.5–15.5)
WBC: 3.8 10*3/uL — ABNORMAL LOW (ref 4.0–10.5)

## 2015-07-24 LAB — I-STAT CHEM 8, ED
BUN: 17 mg/dL (ref 6–20)
Calcium, Ion: 1.13 mmol/L (ref 1.13–1.30)
Chloride: 107 mmol/L (ref 101–111)
Creatinine, Ser: 0.8 mg/dL (ref 0.44–1.00)
GLUCOSE: 116 mg/dL — AB (ref 65–99)
HEMATOCRIT: 41 % (ref 36.0–46.0)
Hemoglobin: 13.9 g/dL (ref 12.0–15.0)
POTASSIUM: 3.9 mmol/L (ref 3.5–5.1)
Sodium: 141 mmol/L (ref 135–145)
TCO2: 23 mmol/L (ref 0–100)

## 2015-07-24 MED ORDER — ASPIRIN EC 325 MG PO TBEC
650.0000 mg | DELAYED_RELEASE_TABLET | Freq: Once | ORAL | Status: AC
  Administered 2015-07-24: 325 mg via ORAL
  Filled 2015-07-24: qty 2

## 2015-07-24 MED ORDER — DIPHENHYDRAMINE HCL 50 MG/ML IJ SOLN
12.5000 mg | Freq: Once | INTRAMUSCULAR | Status: AC
  Administered 2015-07-24: 12.5 mg via INTRAVENOUS
  Filled 2015-07-24: qty 1

## 2015-07-24 MED ORDER — METHYLPREDNISOLONE SODIUM SUCC 125 MG IJ SOLR
125.0000 mg | Freq: Once | INTRAMUSCULAR | Status: AC
  Administered 2015-07-24: 125 mg via INTRAVENOUS
  Filled 2015-07-24: qty 2

## 2015-07-24 MED ORDER — SODIUM CHLORIDE 0.9 % IV SOLN
Freq: Once | INTRAVENOUS | Status: AC
  Administered 2015-07-24: 01:00:00 via INTRAVENOUS

## 2015-07-24 NOTE — ED Notes (Signed)
Patient with left lip swelling on the bottom lip.  Patient with headache and funny feeling in lip and into face.  Patient able to speak in full sentences, right hand feeling itchy and swollen.  Equal hand grips, facial swelling.

## 2015-07-24 NOTE — ED Notes (Signed)
Patient with concern for possible stroke like symptoms at registration desk. MD Sharol Given informed and brought to triage to evaluate; Does not advise activation of Code Stroke at this time.

## 2015-07-24 NOTE — ED Provider Notes (Signed)
CSN: [id number]     Arrival date & time 07/24/15  0030 History   First MD Initiated Contact with Patient 07/24/15 0046     Chief Complaint  Patient presents with  . Facial Swelling     (Consider location/radiation/quality/duration/timing/severity/associated sxs/prior Treatment) HPI Comments: The patient presents tonight with a plethora of complaints.  She states she's been dizzy for the past week and she has to hold onto objects or people in order to ambulate correctly, she also states that she's had a headache, tonight after retiring.  She noticed some tingling to the left side of her face and swelling to the left lower lip.  She has not contacted her physician stating she has an appointment fifth and she thought she could wait until then.  She states that she's been taking a new medication for cholesterol for the past 4 days- LIVALO She also has a extensive list of medication and food allergies.  He states she takes Zyrtec every other day.  Otherwise, she "breaks out"  The history is provided by the patient.    Past Medical History  Diagnosis Date  . Hypertension   . Back pain   . Anemia   . Thrombocytopenia   . HTN (hypertension) 12/03/2013    CT scan-no pheochromocytoma  . GERD (gastroesophageal reflux disease)   . Anxiety   . Vitamin D deficiency    Past Surgical History  Procedure Laterality Date  . Shoulder surgery Bilateral   . Tonsillectomy     Family History  Problem Relation Age of Onset  . Cancer Father    History  Substance Use Topics  . Smoking status: Never Smoker   . Smokeless tobacco: Never Used  . Alcohol Use: No   OB History    No data available     Review of Systems  Constitutional: Negative for fever and chills.  HENT: Positive for facial swelling. Negative for trouble swallowing.   Respiratory: Positive for shortness of breath. Negative for cough.   Cardiovascular: Negative for palpitations and leg swelling.  Gastrointestinal: Negative for  nausea and vomiting.  Skin: Negative for color change.  Neurological: Positive for dizziness and headaches. Negative for seizures, speech difficulty, weakness and light-headedness.  All other systems reviewed and are negative.     Allergies  Beef-derived products; Codeine; Latex; Penicillins; Acetaminophen; Barbiturates; Bee pollen; Bee venom; Celebrex; Diovan; Ivp dye; Lanolin; Mobic; Morphine and related; Nutritional supplements; Other; Promethazine-dm; Shellfish allergy; Tape; Epinephrine; Nitrates, organic; Oxycodone; Petroleum jelly; Soy allergy; Strawberry; Sulfa antibiotics; and Tylox  Home Medications   Prior to Admission medications   Medication Sig Start Date End Date Taking? Authorizing Provider  alprazolam Duanne Moron) 2 MG tablet Take 2 mg by mouth at bedtime as needed for sleep.   Yes Historical Provider, MD  amLODipine (NORVASC) 2.5 MG tablet Take 2.5 mg by mouth daily.   Yes Historical Provider, MD  aspirin 81 MG tablet Take 81 mg by mouth daily.   Yes Historical Provider, MD  cetirizine (ZYRTEC) 10 MG tablet Take 10 mg by mouth every other day.    Yes Historical Provider, MD  Diphenhydramine-Zinc Acetate (BENADRYL ITCH RELIEF EX) Apply 1 application topically 2 (two) times daily as needed (itching/rash).    Yes Historical Provider, MD  docusate sodium (COLACE) 100 MG capsule Take 100 mg by mouth daily as needed for mild constipation.    Yes Historical Provider, MD  EPINEPHrine (EPIPEN IJ) Inject 1 Applicatorful as directed daily as needed (allergic reaction).  Yes Historical Provider, MD  labetalol (NORMODYNE) 200 MG tablet Take 200 mg by mouth 2 (two) times daily.    Yes Historical Provider, MD  LIVALO 1 MG TABS Take 1 tablet by mouth daily.  02/22/15  Yes Historical Provider, MD  triamcinolone cream (KENALOG) 0.5 % Apply 1 application topically 2 (two) times daily as needed (rash/itching).    Yes Historical Provider, MD   BP 108/77 mmHg  Pulse 55  Temp(Src) 98.6 F (37 C)  (Oral)  Resp 15  Ht 5\' 4"  (1.626 m)  Wt 130 lb (58.968 kg)  BMI 22.30 kg/m2  SpO2 99% Physical Exam  Constitutional: She is oriented to person, place, and time. She appears well-developed and well-nourished. No distress.  HENT:  Head: Normocephalic and atraumatic.  Mouth/Throat: Oropharynx is clear and moist.  Slight swelling/edema to the left portion of the lower lip.  No tongue swelling or deviation  Eyes: Pupils are equal, round, and reactive to light.  Neck: Normal range of motion.  Cardiovascular: Normal rate and regular rhythm.   Pulmonary/Chest: Effort normal and breath sounds normal.  Abdominal: Soft. Bowel sounds are normal. She exhibits no distension. There is no tenderness.  Musculoskeletal: Normal range of motion. She exhibits no edema or tenderness.  Lymphadenopathy:    She has no cervical adenopathy.  Neurological: She is alert and oriented to person, place, and time. No cranial nerve deficit.  Equal strong grasps for range of motion of all extremities  Skin: Skin is warm. No erythema.  Nursing note and vitals reviewed.   ED Course  Procedures (including critical care time) Labs Review Labs Reviewed  CBC WITH DIFFERENTIAL/PLATELET - Abnormal; Notable for the following:    WBC 3.8 (*)    RBC 5.33 (*)    MCV 71.1 (*)    MCH 22.5 (*)    Platelets 144 (*)    All other components within normal limits  I-STAT CHEM 8, ED - Abnormal; Notable for the following:    Glucose, Bld 116 (*)    All other components within normal limits    Imaging Review Ct Head Wo Contrast  07/24/2015   CLINICAL DATA:  Lightheadedness.  Possible stroke like symptoms.  EXAM: CT HEAD WITHOUT CONTRAST  TECHNIQUE: Contiguous axial images were obtained from the base of the skull through the vertex without intravenous contrast.  COMPARISON:  MRI 01/11/2015.  FINDINGS: There is no intracranial hemorrhage, mass or evidence of acute infarction. There is slight generalized atrophy. There is minimal  chronic microvascular ischemic change. There is no significant extra-axial fluid collection.  No acute intracranial findings are evident.  IMPRESSION: Minimal chronic atrophy.  No acute findings.   Electronically Signed   By: Andreas Newport M.D.   On: 07/24/2015 02:03     EKG Interpretation None     We'll treat patient for allergic reaction.  We'll also obtain head CT to to persist dizziness, although I think stroke is less likely diagnosis Side effect profile for LIVALO is itching, hives, swelling in the face or hands swelling and tingling in the mouth and throat, chest tightness, trouble breathing, muscle pain, fever A she states she feels the tingling and swelling in her lips and face have decreased significantly, but now she reports a headache.  She states at home she takes aspirin for her headache.  She has some many medication and  food allergies Patient states that her headache has resolved.  She has no more tingling, or swelling in the face.  Will  follow-up with her primary care physician to talk about alternative treatment for her cholesterol MDM   Final diagnoses:  Dizzy  Allergic reaction, initial encounter  Nonintractable headache, unspecified chronicity pattern, unspecified headache type         Junius Creamer, NP 07/24/15 [id number]  Linton Flemings, MD 07/24/15 [phone]

## 2015-07-24 NOTE — Discharge Instructions (Signed)
Stop the [facility]  contact your primary care physician to discuss alternative treatment for your cholesterol

## 2015-07-28 DIAGNOSIS — R21 Rash and other nonspecific skin eruption: Secondary | ICD-10-CM | POA: Diagnosis not present

## 2015-07-28 DIAGNOSIS — F419 Anxiety disorder, unspecified: Secondary | ICD-10-CM | POA: Diagnosis not present

## 2015-07-28 DIAGNOSIS — I10 Essential (primary) hypertension: Secondary | ICD-10-CM | POA: Diagnosis not present

## 2015-07-29 ENCOUNTER — Emergency Department (HOSPITAL_COMMUNITY)
Admission: EM | Admit: 2015-07-29 | Discharge: 2015-07-29 | Disposition: A | Discharge status: Home / Self Care | Payer: Commercial Managed Care - HMO | Attending: Emergency Medicine | Admitting: Emergency Medicine

## 2015-07-29 ENCOUNTER — Encounter (HOSPITAL_COMMUNITY): Payer: Self-pay | Admitting: Emergency Medicine

## 2015-07-29 DIAGNOSIS — Z8639 Personal history of other endocrine, nutritional and metabolic disease: Secondary | ICD-10-CM | POA: Diagnosis not present

## 2015-07-29 DIAGNOSIS — K219 Gastro-esophageal reflux disease without esophagitis: Secondary | ICD-10-CM | POA: Insufficient documentation

## 2015-07-29 DIAGNOSIS — Z7982 Long term (current) use of aspirin: Secondary | ICD-10-CM | POA: Insufficient documentation

## 2015-07-29 DIAGNOSIS — R51 Headache: Secondary | ICD-10-CM | POA: Insufficient documentation

## 2015-07-29 DIAGNOSIS — Z880 Allergy status to penicillin: Secondary | ICD-10-CM | POA: Diagnosis not present

## 2015-07-29 DIAGNOSIS — F419 Anxiety disorder, unspecified: Secondary | ICD-10-CM

## 2015-07-29 DIAGNOSIS — Z79899 Other long term (current) drug therapy: Secondary | ICD-10-CM | POA: Diagnosis not present

## 2015-07-29 DIAGNOSIS — R42 Dizziness and giddiness: Secondary | ICD-10-CM | POA: Diagnosis present

## 2015-07-29 DIAGNOSIS — Z862 Personal history of diseases of the blood and blood-forming organs and certain disorders involving the immune mechanism: Secondary | ICD-10-CM | POA: Diagnosis not present

## 2015-07-29 DIAGNOSIS — I10 Essential (primary) hypertension: Secondary | ICD-10-CM | POA: Diagnosis not present

## 2015-07-29 DIAGNOSIS — Z91040 Latex allergy status: Secondary | ICD-10-CM | POA: Diagnosis not present

## 2015-07-29 DIAGNOSIS — R519 Headache, unspecified: Secondary | ICD-10-CM

## 2015-07-29 MED ORDER — FENTANYL CITRATE (PF) 100 MCG/2ML IJ SOLN
50.0000 ug | Freq: Once | INTRAMUSCULAR | Status: AC
  Administered 2015-07-29: 50 ug via INTRAVENOUS
  Filled 2015-07-29: qty 2

## 2015-07-29 MED ORDER — SODIUM CHLORIDE 0.9 % IV BOLUS (SEPSIS)
1000.0000 mL | Freq: Once | INTRAVENOUS | Status: AC
  Administered 2015-07-29: 1000 mL via INTRAVENOUS

## 2015-07-29 MED ORDER — LORAZEPAM 2 MG/ML IJ SOLN
1.0000 mg | Freq: Once | INTRAMUSCULAR | Status: AC
  Administered 2015-07-29: 1 mg via INTRAVENOUS
  Filled 2015-07-29: qty 1

## 2015-07-29 MED ORDER — METOCLOPRAMIDE HCL 5 MG/ML IJ SOLN
10.0000 mg | Freq: Once | INTRAMUSCULAR | Status: AC
  Administered 2015-07-29: 10 mg via INTRAVENOUS
  Filled 2015-07-29: qty 2

## 2015-07-29 MED ORDER — LORAZEPAM 1 MG PO TABS: 0.5000 mg | ORAL_TABLET | Freq: Three times a day (TID) | ORAL | Status: DC | PRN

## 2015-07-29 NOTE — ED Notes (Signed)
Pt reports feeling SOB.  RR 23, SpO2 100.  Pt appears anxious, fidgety.

## 2015-07-29 NOTE — ED Notes (Signed)
Pt reports hypertension all week.  Pt reports saw PCP this week, had meds increased, still hypertensive.  Initial reading in ED 191/92.  Pt reports chest feeling "crowded", :tired", denies CP, N/V.  Pt endorses weakness, dizziness, lightheadedness.  Pt denies focal weakness. NAD noted at this time.

## 2015-07-29 NOTE — ED Provider Notes (Signed)
CSN: [id number]     Arrival date & time 07/29/15  0541 History   First MD Initiated Contact with Patient 07/29/15 [phone]     Chief Complaint  Patient presents with  . Hypertension      The history is provided by the patient.   patient reports that she is hurting all over and has occasional dizziness and lightheadedness.  She believes this may be secondary to her elevated blood pressure.  She continues to check her blood pressure at home and finds it elevated.  She also reports mild headache at this time.  She seen her physician twice this week.  She recently had her labetalol increased.  She denies chest pain or shortness of breath.  No palpitations.  She denies unilateral arm or leg weakness.  She reports generally "just not feeling right".  She continues to look at their lower pressure monitor in the ER room    Past Medical History  Diagnosis Date  . Hypertension   . Back pain   . Anemia   . Thrombocytopenia   . HTN (hypertension) 12/03/2013    CT scan-no pheochromocytoma  . GERD (gastroesophageal reflux disease)   . Anxiety   . Vitamin D deficiency    Past Surgical History  Procedure Laterality Date  . Shoulder surgery Bilateral   . Tonsillectomy     Family History  Problem Relation Age of Onset  . Cancer Father    History  Substance Use Topics  . Smoking status: Never Smoker   . Smokeless tobacco: Never Used  . Alcohol Use: No   OB History    No data available     Review of Systems  All other systems reviewed and are negative.     Allergies  Beef-derived products; Codeine; Latex; Penicillins; Acetaminophen; Barbiturates; Bee pollen; Bee venom; Celebrex; Diovan; Ivp dye; Lanolin; Mobic; Morphine and related; Nutritional supplements; Other; Promethazine-dm; Shellfish allergy; Tape; Epinephrine; Nitrates, organic; Oxycodone; Petroleum jelly; Soy allergy; Strawberry; Sulfa antibiotics; and Tylox  Home Medications   Prior to Admission medications   Medication Sig  Start Date End Date Taking? Authorizing Provider  alprazolam Duanne Moron) 2 MG tablet Take 2 mg by mouth at bedtime as needed for sleep.   Yes Historical Provider, MD  amLODipine (NORVASC) 2.5 MG tablet Take 2.5 mg by mouth daily.   Yes Historical Provider, MD  aspirin 81 MG tablet Take 81 mg by mouth daily.   Yes Historical Provider, MD  cetirizine (ZYRTEC) 10 MG tablet Take 10 mg by mouth every other day.    Yes Historical Provider, MD  Diphenhydramine-Zinc Acetate (BENADRYL ITCH RELIEF EX) Apply 1 application topically 2 (two) times daily as needed (itching/rash).    Yes Historical Provider, MD  docusate sodium (COLACE) 100 MG capsule Take 100 mg by mouth daily as needed for mild constipation.    Yes Historical Provider, MD  EPINEPHrine (EPIPEN IJ) Inject 1 Applicatorful as directed daily as needed (allergic reaction).    Yes Historical Provider, MD  labetalol (NORMODYNE) 200 MG tablet Take 200 mg by mouth 2 (two) times daily.    Yes Historical Provider, MD  LIVALO 1 MG TABS Take 1 tablet by mouth daily.  02/22/15  Yes Historical Provider, MD  sertraline (ZOLOFT) 50 MG tablet Take 25 mg by mouth daily. 07/24/15  Yes Historical Provider, MD  triamcinolone cream (KENALOG) 0.5 % Apply 1 application topically 2 (two) times daily as needed (rash/itching).    Yes Historical Provider, MD  LORazepam (ATIVAN) 1 MG  tablet Take 0.5 tablets (0.5 mg total) by mouth every 8 (eight) hours as needed for anxiety. 07/29/15   Jola Schmidt, MD   BP 189/81 mmHg  Pulse 69  Temp(Src) 98.4 F (36.9 C) (Oral)  Resp 17  Ht 5\' 4"  (1.626 m)  Wt 130 lb (58.968 kg)  BMI 22.30 kg/m2  SpO2 99% Physical Exam  Constitutional: She is oriented to person, place, and time. She appears well-developed and well-nourished. No distress.  HENT:  Head: Normocephalic and atraumatic.  Eyes: EOM are normal.  Neck: Normal range of motion.  Cardiovascular: Normal rate, regular rhythm and normal heart sounds.   Pulmonary/Chest: Effort normal  and breath sounds normal.  Abdominal: Soft. She exhibits no distension. There is no tenderness.  Musculoskeletal: Normal range of motion.  Neurological: She is alert and oriented to person, place, and time.  Skin: Skin is warm and dry.  Psychiatric:  Anxious appearing  Nursing note and vitals reviewed.   ED Course  Procedures (including critical care time) Labs Review Labs Reviewed - No data to display  Imaging Review No results found.   EKG Interpretation   Date/Time:  Saturday July 29 2015 05:58:32 EDT Ventricular Rate:  66 PR Interval:  146 QRS Duration: 75 QT Interval:  417 QTC Calculation: 437 R Axis:   -14 Text Interpretation:  Sinus rhythm No significant change was found  Confirmed by Abri Vacca  MD, Tinsleigh Slovacek ([id number]) on 07/29/2015 7:14:39 AM      MDM   Final diagnoses:  Anxiety  Headache, unspecified headache type    Pt feels much after anxiety medications. Dc home with pcp follow up  Overall the patient is well-appearing.  She has a nonfocal exam.  With treatment of her anxiety her blood pressure has improved to 114/53 at time of discharge  Jola Schmidt, MD 07/29/15 2336

## 2015-08-04 DIAGNOSIS — D696 Thrombocytopenia, unspecified: Secondary | ICD-10-CM | POA: Diagnosis not present

## 2015-08-04 DIAGNOSIS — I10 Essential (primary) hypertension: Secondary | ICD-10-CM | POA: Diagnosis not present

## 2015-08-04 DIAGNOSIS — R7301 Impaired fasting glucose: Secondary | ICD-10-CM | POA: Diagnosis not present

## 2015-08-04 DIAGNOSIS — F411 Generalized anxiety disorder: Secondary | ICD-10-CM | POA: Diagnosis not present

## 2015-08-04 DIAGNOSIS — L509 Urticaria, unspecified: Secondary | ICD-10-CM | POA: Diagnosis not present

## 2015-08-16 ENCOUNTER — Ambulatory Visit: Payer: BC Managed Care – PPO | Admitting: Cardiology

## 2015-08-28 ENCOUNTER — Telehealth: Payer: Self-pay | Admitting: Cardiology

## 2015-08-28 NOTE — Telephone Encounter (Signed)
Called patient back about her cardiologist and discussed her situation. Patient has some options and will follow up with her PCP and let our office know what she would like to do with her next office visit.

## 2015-08-28 NOTE — Telephone Encounter (Signed)
NewMessage  Pt requested to speak w/ RN about possible procedure. Please call back and discuss.

## 2015-09-01 DIAGNOSIS — G629 Polyneuropathy, unspecified: Secondary | ICD-10-CM | POA: Diagnosis not present

## 2015-09-01 DIAGNOSIS — F411 Generalized anxiety disorder: Secondary | ICD-10-CM | POA: Diagnosis not present

## 2015-09-01 DIAGNOSIS — I10 Essential (primary) hypertension: Secondary | ICD-10-CM | POA: Diagnosis not present

## 2015-09-15 DIAGNOSIS — I7090 Unspecified atherosclerosis: Secondary | ICD-10-CM | POA: Diagnosis not present

## 2015-09-15 DIAGNOSIS — R0789 Other chest pain: Secondary | ICD-10-CM | POA: Diagnosis not present

## 2015-09-15 DIAGNOSIS — K219 Gastro-esophageal reflux disease without esophagitis: Secondary | ICD-10-CM | POA: Diagnosis not present

## 2015-09-15 DIAGNOSIS — R002 Palpitations: Secondary | ICD-10-CM | POA: Diagnosis not present

## 2015-09-15 DIAGNOSIS — I10 Essential (primary) hypertension: Secondary | ICD-10-CM | POA: Diagnosis not present

## 2015-09-15 DIAGNOSIS — E785 Hyperlipidemia, unspecified: Secondary | ICD-10-CM | POA: Diagnosis not present

## 2015-09-18 DIAGNOSIS — R002 Palpitations: Secondary | ICD-10-CM | POA: Diagnosis not present

## 2015-09-18 DIAGNOSIS — I7090 Unspecified atherosclerosis: Secondary | ICD-10-CM | POA: Diagnosis not present

## 2015-09-18 DIAGNOSIS — K219 Gastro-esophageal reflux disease without esophagitis: Secondary | ICD-10-CM | POA: Diagnosis not present

## 2015-09-18 DIAGNOSIS — I10 Essential (primary) hypertension: Secondary | ICD-10-CM | POA: Diagnosis not present

## 2015-09-18 DIAGNOSIS — R0789 Other chest pain: Secondary | ICD-10-CM | POA: Diagnosis not present

## 2015-09-18 DIAGNOSIS — E785 Hyperlipidemia, unspecified: Secondary | ICD-10-CM | POA: Diagnosis not present

## 2015-09-25 DIAGNOSIS — I10 Essential (primary) hypertension: Secondary | ICD-10-CM | POA: Diagnosis not present

## 2015-10-03 DIAGNOSIS — D696 Thrombocytopenia, unspecified: Secondary | ICD-10-CM | POA: Diagnosis not present

## 2015-10-13 DIAGNOSIS — I7090 Unspecified atherosclerosis: Secondary | ICD-10-CM | POA: Diagnosis not present

## 2015-10-13 DIAGNOSIS — E785 Hyperlipidemia, unspecified: Secondary | ICD-10-CM | POA: Diagnosis not present

## 2015-10-13 DIAGNOSIS — K219 Gastro-esophageal reflux disease without esophagitis: Secondary | ICD-10-CM | POA: Diagnosis not present

## 2015-10-13 DIAGNOSIS — R0789 Other chest pain: Secondary | ICD-10-CM | POA: Diagnosis not present

## 2015-10-13 DIAGNOSIS — R002 Palpitations: Secondary | ICD-10-CM | POA: Diagnosis not present

## 2015-10-13 DIAGNOSIS — I10 Essential (primary) hypertension: Secondary | ICD-10-CM | POA: Diagnosis not present

## 2015-12-01 DIAGNOSIS — I10 Essential (primary) hypertension: Secondary | ICD-10-CM | POA: Diagnosis not present

## 2015-12-01 DIAGNOSIS — R7302 Impaired glucose tolerance (oral): Secondary | ICD-10-CM | POA: Diagnosis not present

## 2015-12-01 DIAGNOSIS — R202 Paresthesia of skin: Secondary | ICD-10-CM | POA: Diagnosis not present

## 2015-12-01 DIAGNOSIS — D696 Thrombocytopenia, unspecified: Secondary | ICD-10-CM | POA: Diagnosis not present

## 2015-12-01 DIAGNOSIS — E785 Hyperlipidemia, unspecified: Secondary | ICD-10-CM | POA: Diagnosis not present

## 2015-12-01 DIAGNOSIS — L853 Xerosis cutis: Secondary | ICD-10-CM | POA: Diagnosis not present

## 2015-12-01 DIAGNOSIS — I700 Atherosclerosis of aorta: Secondary | ICD-10-CM | POA: Diagnosis not present

## 2015-12-01 DIAGNOSIS — E559 Vitamin D deficiency, unspecified: Secondary | ICD-10-CM | POA: Diagnosis not present

## 2016-01-04 DIAGNOSIS — D696 Thrombocytopenia, unspecified: Secondary | ICD-10-CM | POA: Diagnosis not present

## 2016-01-26 DIAGNOSIS — I10 Essential (primary) hypertension: Secondary | ICD-10-CM | POA: Diagnosis not present

## 2016-01-26 DIAGNOSIS — I7090 Unspecified atherosclerosis: Secondary | ICD-10-CM | POA: Diagnosis not present

## 2016-01-26 DIAGNOSIS — R0789 Other chest pain: Secondary | ICD-10-CM | POA: Diagnosis not present

## 2016-01-26 DIAGNOSIS — K219 Gastro-esophageal reflux disease without esophagitis: Secondary | ICD-10-CM | POA: Diagnosis not present

## 2016-01-26 DIAGNOSIS — E785 Hyperlipidemia, unspecified: Secondary | ICD-10-CM | POA: Diagnosis not present

## 2016-01-26 DIAGNOSIS — R002 Palpitations: Secondary | ICD-10-CM | POA: Diagnosis not present

## 2016-02-01 ENCOUNTER — Other Ambulatory Visit: Payer: Self-pay | Admitting: Cardiology

## 2016-02-01 DIAGNOSIS — I7090 Unspecified atherosclerosis: Secondary | ICD-10-CM

## 2016-02-09 ENCOUNTER — Ambulatory Visit
Admission: RE | Admit: 2016-02-09 | Discharge: 2016-02-09 | Disposition: A | Discharge status: Home / Self Care | Payer: BC Managed Care – PPO | Source: Ambulatory Visit | Attending: Cardiology | Admitting: Cardiology

## 2016-02-09 ENCOUNTER — Other Ambulatory Visit: Payer: Self-pay | Admitting: Cardiology

## 2016-02-09 DIAGNOSIS — I7090 Unspecified atherosclerosis: Secondary | ICD-10-CM

## 2016-02-09 DIAGNOSIS — I700 Atherosclerosis of aorta: Secondary | ICD-10-CM | POA: Diagnosis not present

## 2016-02-13 ENCOUNTER — Other Ambulatory Visit: Payer: Self-pay | Admitting: Periodontics

## 2016-03-12 DIAGNOSIS — K625 Hemorrhage of anus and rectum: Secondary | ICD-10-CM | POA: Diagnosis not present

## 2016-03-12 DIAGNOSIS — R070 Pain in throat: Secondary | ICD-10-CM | POA: Diagnosis not present

## 2016-03-13 DIAGNOSIS — K921 Melena: Secondary | ICD-10-CM | POA: Diagnosis not present

## 2016-03-13 DIAGNOSIS — R110 Nausea: Secondary | ICD-10-CM | POA: Diagnosis not present

## 2016-03-13 DIAGNOSIS — I10 Essential (primary) hypertension: Secondary | ICD-10-CM | POA: Diagnosis not present

## 2016-03-22 DIAGNOSIS — E785 Hyperlipidemia, unspecified: Secondary | ICD-10-CM | POA: Diagnosis not present

## 2016-03-22 DIAGNOSIS — I7090 Unspecified atherosclerosis: Secondary | ICD-10-CM | POA: Diagnosis not present

## 2016-03-22 DIAGNOSIS — K219 Gastro-esophageal reflux disease without esophagitis: Secondary | ICD-10-CM | POA: Diagnosis not present

## 2016-03-22 DIAGNOSIS — I10 Essential (primary) hypertension: Secondary | ICD-10-CM | POA: Diagnosis not present

## 2016-03-22 DIAGNOSIS — R0789 Other chest pain: Secondary | ICD-10-CM | POA: Diagnosis not present

## 2016-03-22 DIAGNOSIS — I119 Hypertensive heart disease without heart failure: Secondary | ICD-10-CM | POA: Diagnosis not present

## 2016-03-31 ENCOUNTER — Emergency Department (INDEPENDENT_AMBULATORY_CARE_PROVIDER_SITE_OTHER): Payer: Commercial Managed Care - HMO

## 2016-03-31 ENCOUNTER — Emergency Department (INDEPENDENT_AMBULATORY_CARE_PROVIDER_SITE_OTHER)
Admission: EM | Admit: 2016-03-31 | Discharge: 2016-03-31 | Disposition: A | Discharge status: Home / Self Care | Payer: Commercial Managed Care - HMO | Source: Home / Self Care | Attending: Emergency Medicine | Admitting: Emergency Medicine

## 2016-03-31 ENCOUNTER — Encounter (HOSPITAL_COMMUNITY): Payer: Self-pay | Admitting: *Deleted

## 2016-03-31 DIAGNOSIS — J209 Acute bronchitis, unspecified: Secondary | ICD-10-CM | POA: Diagnosis not present

## 2016-03-31 DIAGNOSIS — R05 Cough: Secondary | ICD-10-CM | POA: Diagnosis not present

## 2016-03-31 HISTORY — DX: Hyperlipidemia, unspecified: E78.5

## 2016-03-31 HISTORY — DX: Unspecified atherosclerosis: I70.90

## 2016-03-31 HISTORY — DX: Hypertensive heart disease without heart failure: I11.9

## 2016-03-31 HISTORY — DX: Atherosclerotic heart disease of native coronary artery without angina pectoris: I25.10

## 2016-03-31 MED ORDER — SODIUM CHLORIDE 0.9 % IN NEBU
INHALATION_SOLUTION | RESPIRATORY_TRACT | Status: AC
  Filled 2016-03-31: qty 3

## 2016-03-31 MED ORDER — AZITHROMYCIN 250 MG PO TABS: 250.0000 mg | ORAL_TABLET | Freq: Every day | ORAL | Status: DC

## 2016-03-31 MED ORDER — IPRATROPIUM-ALBUTEROL 0.5-2.5 (3) MG/3ML IN SOLN
RESPIRATORY_TRACT | Status: AC
  Filled 2016-03-31: qty 3

## 2016-03-31 MED ORDER — IPRATROPIUM-ALBUTEROL 0.5-2.5 (3) MG/3ML IN SOLN
3.0000 mL | Freq: Once | RESPIRATORY_TRACT | Status: AC
  Administered 2016-03-31: 3 mL via RESPIRATORY_TRACT

## 2016-03-31 MED ORDER — ALBUTEROL SULFATE HFA 108 (90 BASE) MCG/ACT IN AERS: 2.0000 | INHALATION_SPRAY | RESPIRATORY_TRACT | Status: DC | PRN

## 2016-03-31 NOTE — Discharge Instructions (Signed)

## 2016-03-31 NOTE — ED Notes (Signed)
Gatorade provided

## 2016-03-31 NOTE — ED Notes (Signed)
C/O starting with dry cough on 3/20, followed by chest pain with coughing, decreased appetite.  Unaware of any fevers, but c/o sweating a lot.  Has been taking Delsym for coughing fits, which helps.  C/O some SOB now.  Denies n/v.

## 2016-03-31 NOTE — ED Provider Notes (Signed)
CSN: [id number]     Arrival date & time 03/31/16  1257 History   None    No chief complaint on file.  (Consider location/radiation/quality/duration/timing/severity/associated sxs/prior Treatment) HPI Pt has had a cough for over 1 week. Non productive. Not associated with fever. Some difficulty with breathing, and has noticed wheezing.  Has not seen PCP for these symptoms. Has used TheraFlu without much change in symtpoms.  Past Medical History  Diagnosis Date  . Hypertension   . Back pain   . Anemia   . Thrombocytopenia   . HTN (hypertension) 12/03/2013    CT scan-no pheochromocytoma  . GERD (gastroesophageal reflux disease)   . Anxiety   . Vitamin D deficiency    Past Surgical History  Procedure Laterality Date  . Shoulder surgery Bilateral   . Tonsillectomy     Family History  Problem Relation Age of Onset  . Cancer Father    Social History  Substance Use Topics  . Smoking status: Never Smoker   . Smokeless tobacco: Never Used  . Alcohol Use: No   OB History    No data available     Review of Systems  Allergies  Beef-derived products; Codeine; Latex; Penicillins; Acetaminophen; Barbiturates; Bee pollen; Bee venom; Celebrex; Diovan; Ivp dye; Lanolin; Mobic; Morphine and related; Nutritional supplements; Other; Promethazine-dm; Shellfish allergy; Tape; Epinephrine; Nitrates, organic; Oxycodone; Petroleum jelly; Soy allergy; Strawberry extract; Sulfa antibiotics; and Tylox  Home Medications   Prior to Admission medications   Medication Sig Start Date End Date Taking? Authorizing Provider  alprazolam Duanne Moron) 2 MG tablet Take 2 mg by mouth at bedtime as needed for sleep.    Historical Provider, MD  amLODipine (NORVASC) 2.5 MG tablet Take 2.5 mg by mouth daily.    Historical Provider, MD  aspirin 81 MG tablet Take 81 mg by mouth daily.    Historical Provider, MD  cetirizine (ZYRTEC) 10 MG tablet Take 10 mg by mouth every other day.     Historical Provider, MD   Diphenhydramine-Zinc Acetate (BENADRYL ITCH RELIEF EX) Apply 1 application topically 2 (two) times daily as needed (itching/rash).     Historical Provider, MD  docusate sodium (COLACE) 100 MG capsule Take 100 mg by mouth daily as needed for mild constipation.     Historical Provider, MD  EPINEPHrine (EPIPEN IJ) Inject 1 Applicatorful as directed daily as needed (allergic reaction).     Historical Provider, MD  labetalol (NORMODYNE) 200 MG tablet Take 200 mg by mouth 2 (two) times daily.     Historical Provider, MD  LIVALO 1 MG TABS Take 1 tablet by mouth daily.  02/22/15   Historical Provider, MD  LORazepam (ATIVAN) 1 MG tablet Take 0.5 tablets (0.5 mg total) by mouth every 8 (eight) hours as needed for anxiety. 07/29/15   Jola Schmidt, MD  metoprolol succinate (TOPROL-XL) 50 MG 24 hr tablet Take 50 mg by mouth daily. 08/04/15   Historical Provider, MD  sertraline (ZOLOFT) 50 MG tablet Take 25 mg by mouth daily. 07/24/15   Historical Provider, MD  triamcinolone cream (KENALOG) 0.5 % Apply 1 application topically 2 (two) times daily as needed (rash/itching).     Historical Provider, MD   Meds Ordered and Administered this Visit  Medications - No data to display  There were no vitals taken for this visit. No data found.   Physical Exam NURSES NOTES AND VITAL SIGNS REVIEWED. CONSTITUTIONAL: Well developed, well nourished, no acute distress HEENT: normocephalic, atraumatic EYES: Conjunctiva normal NECK:normal ROM, supple,  no adenopathy PULMONARY:No respiratory distress, normal effort MUSCULOSKELETAL: Normal ROM of all extremities,  SKIN: warm and dry without rash PSYCHIATRIC: Mood and affect, behavior are normal  ED Course  Procedures (including critical care time)  Labs Review Labs Reviewed - No data to display  Imaging Review No results found.   Visual Acuity Review  Right Eye Distance:   Left Eye Distance:   Bilateral Distance:    Right Eye Near:   Left Eye Near:     Bilateral Near:      Review of CXR:no infiltrate Nebulizer treatment: good results, able to breathe deeply RX zpak, albuterol   MDM   1. Bronchospasm with bronchitis, acute     Patient is reassured that there are no issues that require transfer to higher level of care at this time or additional tests. Patient is advised to continue home symptomatic treatment. Patient is advised that if there are new or worsening symptoms to attend the emergency department, contact primary care provider, or return to UC. Instructions of care provided discharged home in stable condition.    THIS NOTE WAS GENERATED USING A VOICE RECOGNITION SOFTWARE PROGRAM. ALL REASONABLE EFFORTS  WERE MADE TO PROOFREAD THIS DOCUMENT FOR ACCURACY.  I have verbally reviewed the discharge instructions with the patient. A printed AVS was given to the patient.  All questions were answered prior to discharge.      Konrad Felix, PA 03/31/16 1610

## 2016-04-18 DIAGNOSIS — K625 Hemorrhage of anus and rectum: Secondary | ICD-10-CM | POA: Diagnosis not present

## 2016-04-18 DIAGNOSIS — K6289 Other specified diseases of anus and rectum: Secondary | ICD-10-CM | POA: Diagnosis not present

## 2016-04-18 DIAGNOSIS — K648 Other hemorrhoids: Secondary | ICD-10-CM | POA: Diagnosis not present

## 2016-04-18 DIAGNOSIS — R12 Heartburn: Secondary | ICD-10-CM | POA: Diagnosis not present

## 2016-04-26 DIAGNOSIS — I7090 Unspecified atherosclerosis: Secondary | ICD-10-CM | POA: Diagnosis not present

## 2016-04-26 DIAGNOSIS — K219 Gastro-esophageal reflux disease without esophagitis: Secondary | ICD-10-CM | POA: Diagnosis not present

## 2016-04-26 DIAGNOSIS — I119 Hypertensive heart disease without heart failure: Secondary | ICD-10-CM | POA: Diagnosis not present

## 2016-04-26 DIAGNOSIS — E785 Hyperlipidemia, unspecified: Secondary | ICD-10-CM | POA: Diagnosis not present

## 2016-05-31 DIAGNOSIS — E785 Hyperlipidemia, unspecified: Secondary | ICD-10-CM | POA: Diagnosis not present

## 2016-05-31 DIAGNOSIS — E559 Vitamin D deficiency, unspecified: Secondary | ICD-10-CM | POA: Diagnosis not present

## 2016-05-31 DIAGNOSIS — I10 Essential (primary) hypertension: Secondary | ICD-10-CM | POA: Diagnosis not present

## 2016-05-31 DIAGNOSIS — Z91038 Other insect allergy status: Secondary | ICD-10-CM | POA: Diagnosis not present

## 2016-05-31 DIAGNOSIS — M79644 Pain in right finger(s): Secondary | ICD-10-CM | POA: Diagnosis not present

## 2016-05-31 DIAGNOSIS — D696 Thrombocytopenia, unspecified: Secondary | ICD-10-CM | POA: Diagnosis not present

## 2016-05-31 DIAGNOSIS — R7309 Other abnormal glucose: Secondary | ICD-10-CM | POA: Diagnosis not present

## 2016-06-17 DIAGNOSIS — M65311 Trigger thumb, right thumb: Secondary | ICD-10-CM | POA: Diagnosis not present

## 2016-06-17 DIAGNOSIS — M79644 Pain in right finger(s): Secondary | ICD-10-CM | POA: Diagnosis not present

## 2016-06-19 DIAGNOSIS — H02834 Dermatochalasis of left upper eyelid: Secondary | ICD-10-CM | POA: Diagnosis not present

## 2016-06-19 DIAGNOSIS — H25813 Combined forms of age-related cataract, bilateral: Secondary | ICD-10-CM | POA: Diagnosis not present

## 2016-06-19 DIAGNOSIS — H04123 Dry eye syndrome of bilateral lacrimal glands: Secondary | ICD-10-CM | POA: Diagnosis not present

## 2016-06-19 DIAGNOSIS — H02831 Dermatochalasis of right upper eyelid: Secondary | ICD-10-CM | POA: Diagnosis not present

## 2016-07-16 ENCOUNTER — Emergency Department (HOSPITAL_COMMUNITY): Payer: Commercial Managed Care - HMO

## 2016-07-16 ENCOUNTER — Encounter (HOSPITAL_COMMUNITY): Payer: Self-pay | Admitting: Nurse Practitioner

## 2016-07-16 ENCOUNTER — Emergency Department (HOSPITAL_COMMUNITY)
Admission: EM | Admit: 2016-07-16 | Discharge: 2016-07-16 | Disposition: A | Discharge status: Home / Self Care | Payer: Commercial Managed Care - HMO | Attending: Emergency Medicine | Admitting: Emergency Medicine

## 2016-07-16 ENCOUNTER — Ambulatory Visit (HOSPITAL_COMMUNITY)
Admission: EM | Admit: 2016-07-16 | Discharge: 2016-07-16 | Disposition: A | Discharge status: Nursing Home (Includes ICF) | Payer: Commercial Managed Care - HMO | Attending: Family Medicine | Admitting: Family Medicine

## 2016-07-16 ENCOUNTER — Encounter (HOSPITAL_COMMUNITY): Payer: Self-pay | Admitting: Emergency Medicine

## 2016-07-16 DIAGNOSIS — F419 Anxiety disorder, unspecified: Secondary | ICD-10-CM | POA: Insufficient documentation

## 2016-07-16 DIAGNOSIS — K5900 Constipation, unspecified: Secondary | ICD-10-CM | POA: Diagnosis not present

## 2016-07-16 DIAGNOSIS — N390 Urinary tract infection, site not specified: Secondary | ICD-10-CM | POA: Diagnosis not present

## 2016-07-16 DIAGNOSIS — Z79899 Other long term (current) drug therapy: Secondary | ICD-10-CM | POA: Insufficient documentation

## 2016-07-16 DIAGNOSIS — Z7982 Long term (current) use of aspirin: Secondary | ICD-10-CM | POA: Diagnosis not present

## 2016-07-16 DIAGNOSIS — K2970 Gastritis, unspecified, without bleeding: Secondary | ICD-10-CM | POA: Diagnosis not present

## 2016-07-16 DIAGNOSIS — I119 Hypertensive heart disease without heart failure: Secondary | ICD-10-CM | POA: Insufficient documentation

## 2016-07-16 DIAGNOSIS — I2510 Atherosclerotic heart disease of native coronary artery without angina pectoris: Secondary | ICD-10-CM | POA: Diagnosis not present

## 2016-07-16 DIAGNOSIS — R1012 Left upper quadrant pain: Secondary | ICD-10-CM

## 2016-07-16 DIAGNOSIS — Z91040 Latex allergy status: Secondary | ICD-10-CM | POA: Insufficient documentation

## 2016-07-16 LAB — COMPREHENSIVE METABOLIC PANEL
ALBUMIN: 4.5 g/dL (ref 3.5–5.0)
ALT: 14 U/L (ref 14–54)
AST: 22 U/L (ref 15–41)
Alkaline Phosphatase: 94 U/L (ref 38–126)
Anion gap: 9 (ref 5–15)
BUN: 12 mg/dL (ref 6–20)
CHLORIDE: 104 mmol/L (ref 101–111)
CO2: 27 mmol/L (ref 22–32)
CREATININE: 0.87 mg/dL (ref 0.44–1.00)
Calcium: 10.1 mg/dL (ref 8.9–10.3)
GFR calc Af Amer: >60 mL/min (ref >60)
GLUCOSE: 106 mg/dL — AB (ref 65–99)
Potassium: 4.5 mmol/L (ref 3.5–5.1)
Sodium: 140 mmol/L (ref 135–145)
Total Bilirubin: 0.9 mg/dL (ref 0.3–1.2)
Total Protein: 7.8 g/dL (ref 6.5–8.1)

## 2016-07-16 LAB — CBC
HEMATOCRIT: 43.3 % (ref 36.0–46.0)
Hemoglobin: 13.5 g/dL (ref 12.0–15.0)
MCH: 22.9 pg — AB (ref 26.0–34.0)
MCHC: 31.2 g/dL (ref 30.0–36.0)
MCV: 73.5 fL — AB (ref 78.0–100.0)
Platelets: 190 10*3/uL (ref 150–400)
RBC: 5.89 MIL/uL — ABNORMAL HIGH (ref 3.87–5.11)
RDW: 14.9 % (ref 11.5–15.5)
WBC: 4.6 10*3/uL (ref 4.0–10.5)

## 2016-07-16 LAB — LIPASE, BLOOD: Lipase: 15 U/L (ref 11–51)

## 2016-07-16 LAB — I-STAT TROPONIN, ED: TROPONIN I, POC: 0 ng/mL (ref 0.00–0.08)

## 2016-07-16 LAB — URINE MICROSCOPIC-ADD ON

## 2016-07-16 LAB — URINALYSIS, ROUTINE W REFLEX MICROSCOPIC
BILIRUBIN URINE: NEGATIVE
GLUCOSE, UA: NEGATIVE mg/dL
Ketones, ur: 15 mg/dL — AB
Nitrite: NEGATIVE
PH: 5 (ref 5.0–8.0)
Protein, ur: NEGATIVE mg/dL
Specific Gravity, Urine: 1.023 (ref 1.005–1.030)

## 2016-07-16 LAB — CBG MONITORING, ED: GLUCOSE-CAPILLARY: 104 mg/dL — AB (ref 65–99)

## 2016-07-16 MED ORDER — LORAZEPAM 2 MG/ML IJ SOLN
1.0000 mg | Freq: Once | INTRAMUSCULAR | Status: AC
  Administered 2016-07-16: 1 mg via INTRAVENOUS
  Filled 2016-07-16: qty 1

## 2016-07-16 MED ORDER — CIPROFLOXACIN IN D5W 400 MG/200ML IV SOLN
400.0000 mg | Freq: Two times a day (BID) | INTRAVENOUS | Status: DC
  Administered 2016-07-16: 400 mg via INTRAVENOUS
  Filled 2016-07-16: qty 200

## 2016-07-16 MED ORDER — CIPROFLOXACIN HCL 500 MG PO TABS: 500.0000 mg | ORAL_TABLET | Freq: Two times a day (BID) | ORAL | Status: DC

## 2016-07-16 MED ORDER — FAMOTIDINE 20 MG PO TABS: 20.0000 mg | ORAL_TABLET | Freq: Two times a day (BID) | ORAL | Status: DC

## 2016-07-16 MED ORDER — SODIUM CHLORIDE 0.9 % IV BOLUS (SEPSIS)
500.0000 mL | Freq: Once | INTRAVENOUS | Status: AC
  Administered 2016-07-16: 500 mL via INTRAVENOUS

## 2016-07-16 MED ORDER — SODIUM CHLORIDE 0.9 % IV SOLN
INTRAVENOUS | Status: DC
  Administered 2016-07-16: 20:00:00 via INTRAVENOUS

## 2016-07-16 NOTE — ED Notes (Signed)
Pt. Has finished drinking the barium dye for CT

## 2016-07-16 NOTE — ED Notes (Signed)
Pt husband exited room stating that pt was "having a panic attack" This RN and Journalist, newspaper entered room and noted that pt hands were shaking, pt was not able to talk, but was able to not head. Pt HR jumped from upper 80's to low 100's. EDP aware and order was placed to admin 1mg  Ativan IV

## 2016-07-16 NOTE — ED Notes (Signed)
Pt to CT

## 2016-07-16 NOTE — ED Notes (Signed)
Patient moved to room 3 and triage completed.

## 2016-07-16 NOTE — ED Notes (Signed)
She c/o 2 day history of abd pain and elevated BP. Pain is in LUQ. She reports nausea. She denies vomiting, fevers, bowel/bladder changes. Her BP has been elevated at home despite taking her BP meds as prescribed. She is alert, breathing easily.

## 2016-07-16 NOTE — ED Notes (Signed)
Patient drinking barium dye bottle 1

## 2016-07-16 NOTE — ED Notes (Signed)
Pt returned from ct

## 2016-07-16 NOTE — ED Provider Notes (Signed)
CSN: [id number]     Arrival date & time 07/16/16  1323 History   First MD Initiated Contact with Patient 07/16/16 1620     Chief Complaint  Patient presents with  . Abdominal Pain    HPI Patient presents to the emergency room with complaints of abdominal pain that started on Sunday. Patient states the symptoms started after eating a meal. She started having pain in her left upper quadrant as well as her epigastric region. It also affected her right side of her abdomen but the left side was more severe. She's felt nauseated but has not had any vomiting. She's had a few loose stools but no diarrhea. Denies any dysuria. She denies any fever. She is also noticed that her blood pressure has been going up and down. She denies any sick contacts. Nothing seems to make it better or worse. She went to an urgent care today and was sent to the emergency room for further evaluation. Past Medical History  Diagnosis Date  . Hypertension   . Back pain   . Anemia   . Thrombocytopenia ([place])   . HTN (hypertension) 12/03/2013    CT scan-no pheochromocytoma  . GERD (gastroesophageal reflux disease)   . Anxiety   . Vitamin D deficiency   . Hypertensive heart disease without heart failure   . Hyperlipidemia   . Coronary artery disease   . Atherosclerosis    Past Surgical History  Procedure Laterality Date  . Shoulder surgery Bilateral   . Tonsillectomy     Family History  Problem Relation Age of Onset  . Cancer Father    Social History  Substance Use Topics  . Smoking status: Never Smoker   . Smokeless tobacco: Never Used  . Alcohol Use: No   OB History    No data available     Review of Systems  All other systems reviewed and are negative.     Allergies  Acetaminophen; Beef-derived products; Codeine; Epinephrine; Latex; Morphine and related; Penicillins; Sertraline; Solu-medrol; Barbiturates; Bee pollen; Bee venom; Diovan; Gabapentin; Ivp dye; Livalo; Nutritional supplements; Other;  Oxycodone; Promethazine-dm; Shellfish allergy; Tape; Celebrex; Lanolin; Mobic; Nitrates, organic; Petroleum jelly; Soy allergy; Strawberry extract; Sulfa antibiotics; and Tylox  Home Medications   Prior to Admission medications   Medication Sig Start Date End Date Taking? Authorizing Provider  amLODipine (NORVASC) 2.5 MG tablet Take 5 mg by mouth daily.    Yes Historical Provider, MD  aspirin 81 MG tablet Take 81 mg by mouth daily as needed for pain.    Yes Historical Provider, MD  cetirizine (ZYRTEC) 10 MG tablet Take 10 mg by mouth daily as needed for allergies.    Yes Historical Provider, MD  EPINEPHrine 0.3 mg/0.3 mL IJ SOAJ injection Inject 0.3 mg into the muscle once. 05/31/16  Yes Historical Provider, MD  LORazepam (ATIVAN) 1 MG tablet Take 0.5 tablets (0.5 mg total) by mouth every 8 (eight) hours as needed for anxiety. 07/29/15  Yes Jola Schmidt, MD  metoprolol succinate (TOPROL-XL) 50 MG 24 hr tablet Take 50 mg by mouth daily.  08/04/15  Yes Historical Provider, MD  spironolactone (ALDACTONE) 25 MG tablet Take 12.5 mg by mouth daily. 1/2 tab daily   Yes Historical Provider, MD  triamcinolone cream (KENALOG) 0.5 % Apply 1 application topically 2 (two) times daily as needed (rash/itching).    Yes Historical Provider, MD  albuterol (PROVENTIL HFA;VENTOLIN HFA) 108 (90 Base) MCG/ACT inhaler Inhale 2 puffs into the lungs every 4 (four) hours as  needed for wheezing or shortness of breath. 03/31/16   Konrad Felix, PA  azithromycin (ZITHROMAX) 250 MG tablet Take 1 tablet (250 mg total) by mouth daily. Take first 2 tablets together, then 1 every day until finished. 03/31/16   Konrad Felix, PA  ciprofloxacin (CIPRO) 500 MG tablet Take 1 tablet (500 mg total) by mouth 2 (two) times daily. 07/16/16   Dorie Rank, MD  famotidine (PEPCID) 20 MG tablet Take 1 tablet (20 mg total) by mouth 2 (two) times daily. 07/16/16   Dorie Rank, MD   BP 118/64 mmHg  Pulse 51  Temp(Src) 97.6 F (36.4 C) (Oral)  Resp 19   SpO2 100% Physical Exam  Constitutional: She appears well-developed and well-nourished. No distress.  HENT:  Head: Normocephalic and atraumatic.  Right Ear: External ear normal.  Left Ear: External ear normal.  Eyes: Conjunctivae are normal. Right eye exhibits no discharge. Left eye exhibits no discharge. No scleral icterus.  Neck: Neck supple. No tracheal deviation present.  Cardiovascular: Normal rate, regular rhythm and intact distal pulses.   Pulses:      Dorsalis pedis pulses are 2+ on the right side, and 2+ on the left side.  Pulmonary/Chest: Effort normal and breath sounds normal. No stridor. No respiratory distress. She has no wheezes. She has no rales.  Abdominal: Soft. Bowel sounds are normal. She exhibits no distension. There is tenderness in the epigastric area and left upper quadrant. There is no rebound and no guarding.  Musculoskeletal: She exhibits no edema or tenderness.  Neurological: She is alert. She has normal strength. No cranial nerve deficit (no facial droop, extraocular movements intact, no slurred speech) or sensory deficit. She exhibits normal muscle tone. She displays no seizure activity. Coordination normal.  Skin: Skin is warm and dry. No rash noted.  Psychiatric: She has a normal mood and affect.  Nursing note and vitals reviewed.   ED Course  Procedures  [id number]  Pt had an episode while being asked about her medications where she started trempbling and stuttering.  As I walked into the room, the patient mentioned that she was just trying to hold it together.  She has tremors in her arms and legs.  She appears anxious  But is alert and answering questions. ?anxiety.  She has not been given any medications.  She has extensive allergies.  Will continue to monitor.  1925  Continues to be tremulous.  BP intermittently increases and she becomes tachycardic.  No aphasia.  CT scan is pending.  Will give dose of ativan.  2125  Pt is feeling much better after the ativan.   We reviewed the ct results.  Labs Review Labs Reviewed  COMPREHENSIVE METABOLIC PANEL - Abnormal; Notable for the following:    Glucose, Bld 106 (*)    All other components within normal limits  CBC - Abnormal; Notable for the following:    RBC 5.89 (*)    MCV 73.5 (*)    MCH 22.9 (*)    All other components within normal limits  URINALYSIS, ROUTINE W REFLEX MICROSCOPIC (NOT AT [facility]) - Abnormal; Notable for the following:    APPearance TURBID (*)    Hgb urine dipstick TRACE (*)    Ketones, ur 15 (*)    Leukocytes, UA MODERATE (*)    All other components within normal limits  URINE MICROSCOPIC-ADD ON - Abnormal; Notable for the following:    Squamous Epithelial / LPF 6-30 (*)    Bacteria, UA MANY (*)  Crystals URIC ACID CRYSTALS (*)    All other components within normal limits  CBG MONITORING, ED - Abnormal; Notable for the following:    Glucose-Capillary 104 (*)    All other components within normal limits  URINE CULTURE  LIPASE, BLOOD  I-STAT TROPOININ, ED    Imaging Review Ct Abdomen Pelvis Wo Contrast  07/16/2016  CLINICAL DATA:  Acute lower abdominal pain and constipation for 3 days. IV contrast allergy. EXAM: CT ABDOMEN AND PELVIS WITHOUT CONTRAST TECHNIQUE: Multidetector CT imaging of the abdomen and pelvis was performed following the standard protocol without IV contrast. COMPARISON:  04/22/2013, 09/06/2009 FINDINGS: Lower chest: Minor bibasilar atelectasis versus scarring, worse on the left. Small calcified paraesophageal and left infrahilar lymph nodes from remote granulomatous disease. Normal heart size. Lower descending thoracic aortic atherosclerosis evident. No pericardial or pleural effusion. Normal heart size. Hepatobiliary: No mass visualized on this un-enhanced exam. Pancreas: No mass or inflammatory process identified on this un-enhanced exam. Spleen: Within normal limits in size. Adrenals/Urinary Tract: No evidence of urolithiasis or hydronephrosis. No definite  mass visualized on this un-enhanced exam. Stomach/Bowel: Diffuse wall thickening of the distal stomach and duodenum, images 26 through 34. This is nonspecific but can be seen with chronic gastritis or duodenitis. No evidence of perforation or free air. Negative for bowel obstruction, significant dilatation, or ileus. Normal appendix demonstrated extending over the right iliac vessels. Moderate retained stool throughout the colon. Distal colon is collapsed. No fluid collection or abscess. Vascular/Lymphatic: Heavily calcified aorta and iliac vessels from atherosclerosis. Negative for aneurysm. No retroperitoneal abnormality or acute hemorrhage. Reproductive: Uterus and adnexal normal in size. No pelvic free fluid or fluid collection. Other: No inguinal abnormality or hernia.  Intact abdominal wall. Musculoskeletal: Degenerative changes noted of the spine. No compression fracture. Facet arthropathy at L4-5 and L5-S1. Vertebral hemangioma at L4. IMPRESSION: Nonspecific mild wall thickening of the distal stomach and duodenum can be seen with chronic gastritis or duodenitis. No evidence of bowel obstruction or perforation. Negative for abscess. No other acute intra-abdominal or pelvic process by noncontrast CT. Aortoiliac atherosclerosis Electronically Signed   By: Jerilynn Mages.  Shick M.D.   On: 07/16/2016 20:21   I have personally reviewed and evaluated these images and lab results as part of my medical decision-making.    MDM   Final diagnoses:  UTI (lower urinary tract infection)  Anxiety  Gastritis    Patient's laboratory tests are suggestive possibly urinary tract infection. This could be the source of her abdominal discomfort. Her laboratory tests are otherwise reassuring.  Patient's CT scan does not demonstrate any significant pathology. There is some thickening of the distal stomach or duodenum.  I will discharge patient home on Pepcid. I Prescribed ciprofloxacin for her urinary tract infection.  Unfortunately she has multiple other allergies and there really is no other alternative.  Patient's anxiety symptoms have resolved.  She will follow up with her primary care doctor later this week to be rechecked.    Dorie Rank, MD 07/16/16 2127

## 2016-07-16 NOTE — ED Notes (Signed)
No answer x 2 at [facility] ED First RN...[name]

## 2016-07-16 NOTE — ED Notes (Signed)
When speaking with pharmacy, pt. Became very anxious, BP increased. Pt. Shaking, and struggling to form words. MD and PA made aware and assessed patient.

## 2016-07-16 NOTE — ED Notes (Signed)
CBG 104 

## 2016-07-16 NOTE — Discharge Instructions (Signed)
Gastritis, Adult Gastritis is soreness and swelling (inflammation) of the lining of the stomach. Gastritis can develop as a sudden onset (acute) or long-term (chronic) condition. If gastritis is not treated, it can lead to stomach bleeding and ulcers. CAUSES  Gastritis occurs when the stomach lining is weak or damaged. Digestive juices from the stomach then inflame the weakened stomach lining. The stomach lining may be weak or damaged due to viral or bacterial infections. One common bacterial infection is the Helicobacter pylori infection. Gastritis can also result from excessive alcohol consumption, taking certain medicines, or having too much acid in the stomach.  SYMPTOMS  In some cases, there are no symptoms. When symptoms are present, they may include:  Pain or a burning sensation in the upper abdomen.  Nausea.  Vomiting.  An uncomfortable feeling of fullness after eating. DIAGNOSIS  Your caregiver may suspect you have gastritis based on your symptoms and a physical exam. To determine the cause of your gastritis, your caregiver may perform the following:  Blood or stool tests to check for the H pylori bacterium.  Gastroscopy. A thin, flexible tube (endoscope) is passed down the esophagus and into the stomach. The endoscope has a light and camera on the end. Your caregiver uses the endoscope to view the inside of the stomach.  Taking a tissue sample (biopsy) from the stomach to examine under a microscope. TREATMENT  Depending on the cause of your gastritis, medicines may be prescribed. If you have a bacterial infection, such as an H pylori infection, antibiotics may be given. If your gastritis is caused by too much acid in the stomach, H2 blockers or antacids may be given. Your caregiver may recommend that you stop taking aspirin, ibuprofen, or other nonsteroidal anti-inflammatory drugs (NSAIDs). HOME CARE INSTRUCTIONS  Only take over-the-counter or prescription medicines as directed by  your caregiver.  If you were given antibiotic medicines, take them as directed. Finish them even if you start to feel better.  Drink enough fluids to keep your urine clear or pale yellow.  Avoid foods and drinks that make your symptoms worse, such as:  Caffeine or alcoholic drinks.  Chocolate.  Peppermint or mint flavorings.  Garlic and onions.  Spicy foods.  Citrus fruits, such as oranges, lemons, or limes.  Tomato-based foods such as sauce, chili, salsa, and pizza.  Fried and fatty foods.  Eat small, frequent meals instead of large meals. SEEK IMMEDIATE MEDICAL CARE IF:   You have black or dark red stools.  You vomit blood or material that looks like coffee grounds.  You are unable to keep fluids down.  Your abdominal pain gets worse.  You have a fever.  You do not feel better after 1 week.  You have any other questions or concerns. MAKE SURE YOU:  Understand these instructions.  Will watch your condition.  Will get help right away if you are not doing well or get worse.   This information is not intended to replace advice given to you by your health care provider. Make sure you discuss any questions you have with your health care provider.   Document Released: 12/10/2001 Document Revised: 06/16/2012 Document Reviewed: 01/29/2012 Elsevier Interactive Patient Education 2016 [employer]  Urinary Tract Infection Urinary tract infections (UTIs) can develop anywhere along your urinary tract. Your urinary tract is your body's drainage system for removing wastes and extra water. Your urinary tract includes two kidneys, two ureters, a bladder, and a urethra. Your kidneys are a pair of bean-shaped organs.  Each kidney is about the size of your fist. They are located below your ribs, one on each side of your spine. CAUSES Infections are caused by microbes, which are microscopic organisms, including fungi, viruses, and bacteria. These organisms are so small that they  can only be seen through a microscope. Bacteria are the microbes that most commonly cause UTIs. SYMPTOMS  Symptoms of UTIs may vary by age and gender of the patient and by the location of the infection. Symptoms in young women typically include a frequent and intense urge to urinate and a painful, burning feeling in the bladder or urethra during urination. Older women and men are more likely to be tired, shaky, and weak and have muscle aches and abdominal pain. A fever may mean the infection is in your kidneys. Other symptoms of a kidney infection include pain in your back or sides below the ribs, nausea, and vomiting. DIAGNOSIS To diagnose a UTI, your caregiver will ask you about your symptoms. Your caregiver will also ask you to provide a urine sample. The urine sample will be tested for bacteria and white blood cells. White blood cells are made by your body to help fight infection. TREATMENT  Typically, UTIs can be treated with medication. Because most UTIs are caused by a bacterial infection, they usually can be treated with the use of antibiotics. The choice of antibiotic and length of treatment depend on your symptoms and the type of bacteria causing your infection. HOME CARE INSTRUCTIONS  If you were prescribed antibiotics, take them exactly as your caregiver instructs you. Finish the medication even if you feel better after you have only taken some of the medication.  Drink enough water and fluids to keep your urine clear or pale yellow.  Avoid caffeine, tea, and carbonated beverages. They tend to irritate your bladder.  Empty your bladder often. Avoid holding urine for long periods of time.  Empty your bladder before and after sexual intercourse.  After a bowel movement, women should cleanse from front to back. Use each tissue only once. SEEK MEDICAL CARE IF:   You have back pain.  You develop a fever.  Your symptoms do not begin to resolve within 3 days. SEEK IMMEDIATE MEDICAL  CARE IF:   You have severe back pain or lower abdominal pain.  You develop chills.  You have nausea or vomiting.  You have continued burning or discomfort with urination. MAKE SURE YOU:   Understand these instructions.  Will watch your condition.  Will get help right away if you are not doing well or get worse.   This information is not intended to replace advice given to you by your health care provider. Make sure you discuss any questions you have with your health care provider.   Document Released: 09/25/2005 Document Revised: 09/06/2015 Document Reviewed: 01/24/2012 Elsevier Interactive Patient Education [employer].

## 2016-07-16 NOTE — ED Provider Notes (Signed)
CSN: [id number]     Arrival date & time 07/16/16  1047 History   First MD Initiated Contact with Patient 07/16/16 1228     Chief Complaint  Patient presents with  . Abdominal Pain   (Consider location/radiation/quality/duration/timing/severity/associated sxs/prior Treatment) Patient is a 75 y.o. female presenting with abdominal pain. The history is provided by the patient.  Abdominal Pain Pain location:  Epigastric and LUQ Pain quality: burning   Pain radiates to:  Does not radiate Pain severity:  Moderate Onset quality:  Gradual Duration:  3 days Progression:  Worsening Chronicity:  New Context: not sick contacts   Relieved by:  None tried Worsened by:  Nothing tried Ineffective treatments:  None tried Associated symptoms: nausea   Associated symptoms: no constipation, no diarrhea, no fever, no shortness of breath and no vomiting   Associated symptoms comment:  Last bm this am wnl. Risk factors: being elderly   Risk factors: not obese     Past Medical History  Diagnosis Date  . Hypertension   . Back pain   . Anemia   . Thrombocytopenia ([place])   . HTN (hypertension) 12/03/2013    CT scan-no pheochromocytoma  . GERD (gastroesophageal reflux disease)   . Anxiety   . Vitamin D deficiency   . Hypertensive heart disease without heart failure   . Hyperlipidemia   . Coronary artery disease   . Atherosclerosis    Past Surgical History  Procedure Laterality Date  . Shoulder surgery Bilateral   . Tonsillectomy     Family History  Problem Relation Age of Onset  . Cancer Father    Social History  Substance Use Topics  . Smoking status: Never Smoker   . Smokeless tobacco: Never Used  . Alcohol Use: No   OB History    No data available     Review of Systems  Constitutional: Negative.  Negative for fever.  Respiratory: Negative for shortness of breath.   Cardiovascular: Negative.   Gastrointestinal: Positive for nausea and abdominal pain. Negative for vomiting,  diarrhea and constipation.  Genitourinary: Negative.   All other systems reviewed and are negative.   Allergies  Beef-derived products; Codeine; Latex; Penicillins; Acetaminophen; Barbiturates; Bee pollen; Bee venom; Celebrex; Diovan; Gabapentin; Ivp dye; Lanolin; Livalo; Mobic; Morphine and related; Nutritional supplements; Other; Promethazine-dm; Shellfish allergy; Tape; Epinephrine; Nitrates, organic; Oxycodone; Petroleum jelly; Soy allergy; Strawberry extract; Sulfa antibiotics; and Tylox  Home Medications   Prior to Admission medications   Medication Sig Start Date End Date Taking? Authorizing Provider  amLODipine (NORVASC) 10 MG tablet Take 10 mg by mouth daily.   Yes Historical Provider, MD  amLODipine (NORVASC) 2.5 MG tablet Take 2.5 mg by mouth daily. 2 tabs PO daily   Yes Historical Provider, MD  spironolactone (ALDACTONE) 25 MG tablet Take 25 mg by mouth daily. 1/2 tab daily   Yes Historical Provider, MD  albuterol (PROVENTIL HFA;VENTOLIN HFA) 108 (90 Base) MCG/ACT inhaler Inhale 2 puffs into the lungs every 4 (four) hours as needed for wheezing or shortness of breath. 03/31/16   Konrad Felix, PA  alprazolam Duanne Moron) 2 MG tablet Take 2 mg by mouth at bedtime as needed for sleep.    Historical Provider, MD  aspirin 81 MG tablet Take 81 mg by mouth daily.    Historical Provider, MD  azithromycin (ZITHROMAX) 250 MG tablet Take 1 tablet (250 mg total) by mouth daily. Take first 2 tablets together, then 1 every day until finished. 03/31/16   Konrad Felix,  PA  cetirizine (ZYRTEC) 10 MG tablet Take 10 mg by mouth every other day.     Historical Provider, MD  Diphenhydramine-Zinc Acetate (BENADRYL ITCH RELIEF EX) Apply 1 application topically 2 (two) times daily as needed (itching/rash).     Historical Provider, MD  docusate sodium (COLACE) 100 MG capsule Take 100 mg by mouth daily as needed for mild constipation.     Historical Provider, MD  EPINEPHrine (EPIPEN IJ) Inject 1 Applicatorful  as directed daily as needed (allergic reaction).     Historical Provider, MD  labetalol (NORMODYNE) 200 MG tablet Take 200 mg by mouth 2 (two) times daily.     Historical Provider, MD  LIVALO 1 MG TABS Take 1 tablet by mouth daily.  02/22/15   Historical Provider, MD  LORazepam (ATIVAN) 1 MG tablet Take 0.5 tablets (0.5 mg total) by mouth every 8 (eight) hours as needed for anxiety. 07/29/15   Jola Schmidt, MD  metoprolol succinate (TOPROL-XL) 50 MG 24 hr tablet Take 50 mg by mouth 2 (two) times daily.  08/04/15   Historical Provider, MD  sertraline (ZOLOFT) 50 MG tablet Take 25 mg by mouth daily. 07/24/15   Historical Provider, MD  triamcinolone cream (KENALOG) 0.5 % Apply 1 application topically 2 (two) times daily as needed (rash/itching).     Historical Provider, MD   Meds Ordered and Administered this Visit  Medications - No data to display  BP 174/72 mmHg  Pulse 74  Temp(Src) 97.7 F (36.5 C) (Oral)  Resp 16  SpO2 100% No data found.   Physical Exam  Constitutional: She is oriented to person, place, and time. She appears well-developed and well-nourished.  Abdominal: Soft. Normal appearance and bowel sounds are normal. She exhibits no distension and no mass. There is no hepatosplenomegaly. There is tenderness in the epigastric area and left upper quadrant. There is no rebound, no guarding and no CVA tenderness. No hernia.    Neurological: She is alert and oriented to person, place, and time.  Skin: Skin is warm and dry.  Nursing note and vitals reviewed.   ED Course  Procedures (including critical care time)  Labs Review Labs Reviewed - No data to display  Imaging Review No results found.   Visual Acuity Review  Right Eye Distance:   Left Eye Distance:   Bilateral Distance:    Right Eye Near:   Left Eye Near:    Bilateral Near:         MDM   1. Left upper quadrant pain        Donna Fischer, MD 07/16/16 1316

## 2016-07-16 NOTE — ED Notes (Signed)
Patient presented to the [facility] with a complaint of left sided abdominal pain and nausea x 3 days. The patient's vitals as stated above. The patient appeared stable. Patient returned to the lobby to await a room.

## 2016-07-18 LAB — URINE CULTURE

## 2016-07-29 DIAGNOSIS — M542 Cervicalgia: Secondary | ICD-10-CM | POA: Diagnosis not present

## 2016-07-29 DIAGNOSIS — I10 Essential (primary) hypertension: Secondary | ICD-10-CM | POA: Diagnosis not present

## 2016-07-29 DIAGNOSIS — N390 Urinary tract infection, site not specified: Secondary | ICD-10-CM | POA: Diagnosis not present

## 2016-08-02 DIAGNOSIS — I119 Hypertensive heart disease without heart failure: Secondary | ICD-10-CM | POA: Diagnosis not present

## 2016-08-02 DIAGNOSIS — E785 Hyperlipidemia, unspecified: Secondary | ICD-10-CM | POA: Diagnosis not present

## 2016-08-02 DIAGNOSIS — I7090 Unspecified atherosclerosis: Secondary | ICD-10-CM | POA: Diagnosis not present

## 2016-08-02 DIAGNOSIS — K219 Gastro-esophageal reflux disease without esophagitis: Secondary | ICD-10-CM | POA: Diagnosis not present

## 2016-09-06 DIAGNOSIS — K2970 Gastritis, unspecified, without bleeding: Secondary | ICD-10-CM | POA: Diagnosis not present

## 2016-09-06 DIAGNOSIS — G609 Hereditary and idiopathic neuropathy, unspecified: Secondary | ICD-10-CM | POA: Diagnosis not present

## 2016-09-06 DIAGNOSIS — I10 Essential (primary) hypertension: Secondary | ICD-10-CM | POA: Diagnosis not present

## 2016-09-06 DIAGNOSIS — R7301 Impaired fasting glucose: Secondary | ICD-10-CM | POA: Diagnosis not present

## 2016-09-24 ENCOUNTER — Emergency Department (HOSPITAL_COMMUNITY)
Admission: EM | Admit: 2016-09-24 | Discharge: 2016-09-25 | Disposition: A | Discharge status: Home / Self Care | Payer: Commercial Managed Care - HMO | Attending: Emergency Medicine | Admitting: Emergency Medicine

## 2016-09-24 ENCOUNTER — Emergency Department (HOSPITAL_COMMUNITY): Payer: Commercial Managed Care - HMO

## 2016-09-24 ENCOUNTER — Other Ambulatory Visit: Payer: Self-pay

## 2016-09-24 ENCOUNTER — Encounter (HOSPITAL_COMMUNITY): Payer: Self-pay | Admitting: Emergency Medicine

## 2016-09-24 DIAGNOSIS — I2510 Atherosclerotic heart disease of native coronary artery without angina pectoris: Secondary | ICD-10-CM | POA: Insufficient documentation

## 2016-09-24 DIAGNOSIS — I119 Hypertensive heart disease without heart failure: Secondary | ICD-10-CM | POA: Insufficient documentation

## 2016-09-24 DIAGNOSIS — R079 Chest pain, unspecified: Secondary | ICD-10-CM

## 2016-09-24 DIAGNOSIS — Z7982 Long term (current) use of aspirin: Secondary | ICD-10-CM | POA: Diagnosis not present

## 2016-09-24 DIAGNOSIS — Z7984 Long term (current) use of oral hypoglycemic drugs: Secondary | ICD-10-CM | POA: Diagnosis not present

## 2016-09-24 DIAGNOSIS — R002 Palpitations: Secondary | ICD-10-CM | POA: Diagnosis not present

## 2016-09-24 DIAGNOSIS — R0789 Other chest pain: Secondary | ICD-10-CM | POA: Diagnosis not present

## 2016-09-24 DIAGNOSIS — Z91040 Latex allergy status: Secondary | ICD-10-CM | POA: Insufficient documentation

## 2016-09-24 LAB — BASIC METABOLIC PANEL
ANION GAP: 11 (ref 5–15)
BUN: 16 mg/dL (ref 6–20)
CO2: 22 mmol/L (ref 22–32)
Calcium: 9.7 mg/dL (ref 8.9–10.3)
Chloride: 105 mmol/L (ref 101–111)
Creatinine, Ser: 0.98 mg/dL (ref 0.44–1.00)
GFR, EST NON AFRICAN AMERICAN: 55 mL/min — AB (ref >60)
Glucose, Bld: 115 mg/dL — ABNORMAL HIGH (ref 65–99)
POTASSIUM: 3.7 mmol/L (ref 3.5–5.1)
SODIUM: 138 mmol/L (ref 135–145)

## 2016-09-24 LAB — CBC
HEMATOCRIT: 40.8 % (ref 36.0–46.0)
HEMOGLOBIN: 12.8 g/dL (ref 12.0–15.0)
MCH: 22.7 pg — ABNORMAL LOW (ref 26.0–34.0)
MCHC: 31.4 g/dL (ref 30.0–36.0)
MCV: 72.2 fL — ABNORMAL LOW (ref 78.0–100.0)
PLATELETS: 134 10*3/uL — AB (ref 150–400)
RBC: 5.65 MIL/uL — AB (ref 3.87–5.11)
RDW: 14.9 % (ref 11.5–15.5)
WBC: 5.8 10*3/uL (ref 4.0–10.5)

## 2016-09-24 LAB — I-STAT TROPONIN, ED: Troponin i, poc: 0 ng/mL (ref 0.00–0.08)

## 2016-09-24 NOTE — ED Triage Notes (Signed)
Pty here with sudden onset CP with SOB starting at 1600 today. Pt also reports SOB, pain in back, left neck and left jaw. Pt a/o x 4, resp e/u at this time.

## 2016-09-24 NOTE — ED Notes (Signed)
Pt arrives to [facility] at this time via [facility].  Pt reports that she began having chest pain at 1600 this afternoon. Pt reports nausea with the chest pain. Pt denies having pain at this time. Pt states that she has "crowding" in her chest at this time.   Chief Complaint  Patient presents with  . Chest Pain  . Shortness of Breath   Past Medical History:  Diagnosis Date  . Anemia   . Anxiety   . Atherosclerosis   . Back pain   . Coronary artery disease   . GERD (gastroesophageal reflux disease)   . HTN (hypertension) 12/03/2013   CT scan-no pheochromocytoma  . Hyperlipidemia   . Hypertension   . Hypertensive heart disease without heart failure   . Thrombocytopenia ([place])   . Vitamin D deficiency

## 2016-09-25 LAB — I-STAT TROPONIN, ED: TROPONIN I, POC: 0 ng/mL (ref 0.00–0.08)

## 2016-09-25 NOTE — ED Notes (Signed)
Pt provided with d/c instructions at this time. Pt verbalizes understanding of d/c instructions as well as follow up procedure after d/c.  No new RX at time of d/c.  Pt in no apparent distress at this time. Pt assisted out of the ED via WC at this time.   

## 2016-09-25 NOTE — ED Provider Notes (Addendum)
[facility] DEPT Provider Note   CSN: [id number] Arrival date & time: 09/24/16  1650     History   Chief Complaint Chief Complaint  Patient presents with  . Chest Pain  . Shortness of Breath    HPI Donna Baldwin is a 75 y.o. female. Patient reports initiation of sharp chest discomfort which began at around 1600 this afternoon.  She reports no discomfort or pain at this time.  She reported mild shortness of breath at this time but reports no shortness of breath currently.  She does have a history of anemia and coronary artery disease.  She reports this feels different than her prior MI.  No discomfort or pain at this time.  Denies abdominal pain.  No nausea vomiting or diarrhea.  No acute recent injury or trauma.   The history is provided by the patient.    Past Medical History:  Diagnosis Date  . Anemia   . Anxiety   . Atherosclerosis   . Back pain   . Coronary artery disease   . GERD (gastroesophageal reflux disease)   . HTN (hypertension) 12/03/2013   CT scan-no pheochromocytoma  . Hyperlipidemia   . Hypertension   . Hypertensive heart disease without heart failure   . Thrombocytopenia ([place])   . Vitamin D deficiency     Patient Active Problem List   Diagnosis Date Noted  . 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 Unspecified hereditary and idiopathic peripheral neuropathy 07/07/2014  . Aortic atherosclerosis ([place]) 12/03/2013  . Familial hyperlipidemia 12/03/2013  . HTN (hypertension) 12/03/2013  . GERD (gastroesophageal reflux disease) 03/24/2013  . Obstructive sleep apnea, ruled out 02/28/2013  . 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 Urticaria due to food allergy 08/31/2012  . Thrombocytopenia ([place])     Past Surgical History:  Procedure Laterality Date  . SHOULDER SURGERY Bilateral   . TONSILLECTOMY      OB History    No data available       Home Medications    Prior to Admission medications   Medication Sig Start  Date End Date Taking? Authorizing Provider  amLODipine (NORVASC) 2.5 MG tablet Take 2.5 mg by mouth daily.    Yes Historical Provider, MD  aspirin 81 MG tablet Take 81 mg by mouth daily as needed for pain.    Yes Historical Provider, MD  docusate sodium (COLACE) 100 MG capsule Take 100 mg by mouth daily as needed for mild constipation.   Yes Historical Provider, MD  EPINEPHrine 0.3 mg/0.3 mL IJ SOAJ injection Inject 0.3 mg into the muscle once. 05/31/16  Yes Historical Provider, MD  famotidine (PEPCID) 20 MG tablet Take 1 tablet (20 mg total) by mouth 2 (two) times daily. Patient taking differently: Take 20 mg by mouth at bedtime.  07/16/16  Yes Dorie Rank, MD  LORazepam (ATIVAN) 1 MG tablet Take 0.5 tablets (0.5 mg total) by mouth every 8 (eight) hours as needed for anxiety. 07/29/15  Yes Jola Schmidt, MD  metFORMIN (GLUCOPHAGE-XR) 500 MG 24 hr tablet Take 500 mg by mouth every evening.   Yes Historical Provider, MD  metoprolol succinate (TOPROL-XL) 50 MG 24 hr tablet Take 50 mg by mouth daily.  08/04/15  Yes Historical Provider, MD  spironolactone (ALDACTONE) 25 MG tablet Take 12.5 mg by mouth daily. 1/2 tab daily   Yes Historical Provider, MD  albuterol (PROVENTIL HFA;VENTOLIN HFA) 108 (90 Base) MCG/ACT inhaler Inhale 2 puffs into the lungs every 4 (four) hours as needed  for wheezing or shortness of breath. Patient not taking: Reported on 09/24/2016 03/31/16   Konrad Felix, PA  azithromycin (ZITHROMAX) 250 MG tablet Take 1 tablet (250 mg total) by mouth daily. Take first 2 tablets together, then 1 every day until finished. Patient not taking: Reported on 09/24/2016 03/31/16   Konrad Felix, PA  ciprofloxacin (CIPRO) 500 MG tablet Take 1 tablet (500 mg total) by mouth 2 (two) times daily. Patient not taking: Reported on 09/24/2016 07/16/16   Dorie Rank, MD    Family History Family History  Problem Relation Age of Onset  . Cancer Father     Social History Social History  Substance Use Topics  .  Smoking status: Never Smoker  . Smokeless tobacco: Never Used  . Alcohol use No     Allergies   Acetaminophen; Beef-derived products; Codeine; Epinephrine; Latex; Morphine and related; Penicillins; Sertraline; Solu-medrol [methylprednisolone]; Barbiturates; Bee pollen; Bee venom; Diovan [valsartan]; Gabapentin; Ivp dye [iodinated diagnostic agents]; Livalo [pitavastatin]; Nutritional supplements; Other; Oxycodone; Promethazine-dm; Shellfish allergy; Tape; Celebrex [celecoxib]; Lanolin; Mobic [meloxicam]; Nitrates, organic; Petroleum jelly [petrolatum]; Soy allergy; Strawberry extract; Sulfa antibiotics; and Tylox [oxycodone-acetaminophen]   Review of Systems Review of Systems  All other systems reviewed and are negative.    Physical Exam Updated Vital Signs BP 151/63   Pulse (!) 49   Temp 97.7 F (36.5 C)   Resp 14   Ht 5\' 4"  (1.626 m)   Wt 136 lb (61.7 kg)   SpO2 100%   BMI 23.34 kg/m   Physical Exam  Constitutional: She is oriented to person, place, and time. She appears well-developed and well-nourished. No distress.  HENT:  Head: Normocephalic and atraumatic.  Eyes: EOM are normal.  Neck: Normal range of motion.  Cardiovascular: Normal rate, regular rhythm and normal heart sounds.   Pulmonary/Chest: Effort normal and breath sounds normal.  Abdominal: Soft. She exhibits no distension. There is no tenderness.  Musculoskeletal: Normal range of motion.  Neurological: She is alert and oriented to person, place, and time.  Skin: Skin is warm and dry.  Psychiatric: She has a normal mood and affect. Judgment normal.  Nursing note and vitals reviewed.    ED Treatments / Results  Labs (all labs ordered are listed, but only abnormal results are displayed) Labs Reviewed  BASIC METABOLIC PANEL - Abnormal; Notable for the following:       Result Value   Glucose, Bld 115 (*)    GFR calc non Af Amer 55 (*)    All other components within normal limits  CBC - Abnormal;  Notable for the following:    RBC 5.65 (*)    MCV 72.2 (*)    MCH 22.7 (*)    Platelets 134 (*)    All other components within normal limits  I-STAT TROPOININ, ED  I-STAT TROPOININ, ED    EKG  EKG Interpretation #1  Date/Time:  Tuesday September 24 2016 16:56:02 EDT Ventricular Rate:  84 PR Interval:  150 QRS Duration: 76 QT Interval:  400 QTC Calculation: 472 R Axis:   -27 Text Interpretation:  Normal sinus rhythm Normal ECG No significant change since last tracing No significant change since last tracing Reconfirmed by Winfred Leeds  MD, SAM [phone]) on 09/24/2016 11:31:21 PM        EKG Interpretation #2  Date/Time:  Tuesday September 24 2016 23:27:24 EDT Ventricular Rate:  50 PR Interval:  150 QRS Duration: 93 QT Interval:  469 QTC Calculation: 428 R Axis:  7 Text Interpretation:  Sinus rhythm Abnormal R-wave progression, early transition No significant change was found Confirmed by Landon Truax  MD, Damaso Laday ([id number]) on 09/25/2016 12:45:26 AM        Radiology Dg Chest 2 View  Result Date: 09/24/2016 CLINICAL DATA:  Chest pain EXAM: CHEST  2 VIEW COMPARISON:  03/31/2016 chest radiograph. FINDINGS: Stable cardiomediastinal silhouette with normal heart size and aortic atherosclerosis. No pneumothorax. No pleural effusion. No pulmonary edema. No acute consolidative airspace disease. Minimal scarring versus atelectasis at the left costophrenic angle. IMPRESSION: 1. Minimal scarring versus atelectasis at the left costophrenic angle. Otherwise no active cardiopulmonary disease. 2. Aortic atherosclerosis. Electronically Signed   By: Ilona Sorrel M.D.   On: 09/24/2016 18:16    Procedures Procedures (including critical care time)  Medications Ordered in ED Medications - No data to display   Initial Impression / Assessment and Plan / ED Course  I have reviewed the triage vital signs and the nursing notes.  Pertinent labs & imaging results that were available during my care of the  patient were reviewed by me and considered in my medical decision making (see chart for details).  Clinical Course    Overall well-appearing.  She has been transient palpitations associated with this as well.  I recommend a cardiology follow-up.  Overall well-appearing here.  EKG is nonischemic.  Troponins negative 2.  Patient is safe for discharge home from the emergency department.  Primary care and cardiology follow-up.  Final Clinical Impressions(s) / ED Diagnoses   Final diagnoses:  Chest pain, unspecified chest pain type  Palpitations    New Prescriptions Discharge Medication List as of 09/25/2016 12:49 AM       Jola Schmidt, MD 09/29/16 [place], MD 10/31/16 [phone]

## 2016-09-26 DIAGNOSIS — I7090 Unspecified atherosclerosis: Secondary | ICD-10-CM | POA: Diagnosis not present

## 2016-09-26 DIAGNOSIS — K219 Gastro-esophageal reflux disease without esophagitis: Secondary | ICD-10-CM | POA: Diagnosis not present

## 2016-09-26 DIAGNOSIS — I119 Hypertensive heart disease without heart failure: Secondary | ICD-10-CM | POA: Diagnosis not present

## 2016-09-26 DIAGNOSIS — R0789 Other chest pain: Secondary | ICD-10-CM | POA: Diagnosis not present

## 2016-09-26 DIAGNOSIS — E785 Hyperlipidemia, unspecified: Secondary | ICD-10-CM | POA: Diagnosis not present

## 2016-09-27 DIAGNOSIS — K219 Gastro-esophageal reflux disease without esophagitis: Secondary | ICD-10-CM | POA: Diagnosis not present

## 2016-09-27 DIAGNOSIS — E785 Hyperlipidemia, unspecified: Secondary | ICD-10-CM | POA: Diagnosis not present

## 2016-09-27 DIAGNOSIS — R0789 Other chest pain: Secondary | ICD-10-CM | POA: Diagnosis not present

## 2016-09-27 DIAGNOSIS — I119 Hypertensive heart disease without heart failure: Secondary | ICD-10-CM | POA: Diagnosis not present

## 2016-09-27 DIAGNOSIS — R002 Palpitations: Secondary | ICD-10-CM | POA: Diagnosis not present

## 2016-09-27 DIAGNOSIS — I7090 Unspecified atherosclerosis: Secondary | ICD-10-CM | POA: Diagnosis not present

## 2016-09-28 DIAGNOSIS — R002 Palpitations: Secondary | ICD-10-CM | POA: Diagnosis not present

## 2016-10-23 DIAGNOSIS — R1313 Dysphagia, pharyngeal phase: Secondary | ICD-10-CM | POA: Diagnosis not present

## 2016-10-28 ENCOUNTER — Other Ambulatory Visit: Payer: Self-pay | Admitting: Family Medicine

## 2016-10-28 DIAGNOSIS — Z1231 Encounter for screening mammogram for malignant neoplasm of breast: Secondary | ICD-10-CM

## 2016-10-29 DIAGNOSIS — K219 Gastro-esophageal reflux disease without esophagitis: Secondary | ICD-10-CM | POA: Diagnosis not present

## 2016-10-29 DIAGNOSIS — R0789 Other chest pain: Secondary | ICD-10-CM | POA: Diagnosis not present

## 2016-10-29 DIAGNOSIS — I119 Hypertensive heart disease without heart failure: Secondary | ICD-10-CM | POA: Diagnosis not present

## 2016-10-29 DIAGNOSIS — E785 Hyperlipidemia, unspecified: Secondary | ICD-10-CM | POA: Diagnosis not present

## 2016-10-29 DIAGNOSIS — I7090 Unspecified atherosclerosis: Secondary | ICD-10-CM | POA: Diagnosis not present

## 2016-10-29 DIAGNOSIS — R002 Palpitations: Secondary | ICD-10-CM | POA: Diagnosis not present

## 2016-10-31 DIAGNOSIS — M65311 Trigger thumb, right thumb: Secondary | ICD-10-CM | POA: Diagnosis not present

## 2016-10-31 DIAGNOSIS — M79641 Pain in right hand: Secondary | ICD-10-CM | POA: Diagnosis not present

## 2016-11-04 ENCOUNTER — Other Ambulatory Visit (HOSPITAL_COMMUNITY): Payer: Self-pay | Admitting: Family Medicine

## 2016-11-04 DIAGNOSIS — I10 Essential (primary) hypertension: Secondary | ICD-10-CM | POA: Diagnosis not present

## 2016-11-04 DIAGNOSIS — R1313 Dysphagia, pharyngeal phase: Secondary | ICD-10-CM

## 2016-11-04 DIAGNOSIS — F419 Anxiety disorder, unspecified: Secondary | ICD-10-CM | POA: Diagnosis not present

## 2016-11-04 DIAGNOSIS — R1311 Dysphagia, oral phase: Secondary | ICD-10-CM | POA: Diagnosis not present

## 2016-11-06 ENCOUNTER — Other Ambulatory Visit (HOSPITAL_COMMUNITY): Payer: Self-pay | Admitting: Family Medicine

## 2016-11-06 DIAGNOSIS — R1311 Dysphagia, oral phase: Secondary | ICD-10-CM

## 2016-11-07 ENCOUNTER — Ambulatory Visit (HOSPITAL_COMMUNITY)
Admission: RE | Admit: 2016-11-07 | Discharge: 2016-11-07 | Disposition: A | Discharge status: Home / Self Care | Payer: Commercial Managed Care - HMO | Source: Ambulatory Visit | Attending: Family Medicine | Admitting: Family Medicine

## 2016-11-07 ENCOUNTER — Other Ambulatory Visit (HOSPITAL_COMMUNITY): Payer: Self-pay | Admitting: Family Medicine

## 2016-11-07 DIAGNOSIS — D696 Thrombocytopenia, unspecified: Secondary | ICD-10-CM | POA: Diagnosis not present

## 2016-11-07 DIAGNOSIS — R1310 Dysphagia, unspecified: Secondary | ICD-10-CM | POA: Diagnosis not present

## 2016-11-07 DIAGNOSIS — R1311 Dysphagia, oral phase: Secondary | ICD-10-CM

## 2016-11-07 DIAGNOSIS — I10 Essential (primary) hypertension: Secondary | ICD-10-CM | POA: Diagnosis not present

## 2016-11-08 ENCOUNTER — Ambulatory Visit (HOSPITAL_COMMUNITY): Payer: Commercial Managed Care - HMO

## 2016-11-15 ENCOUNTER — Ambulatory Visit (HOSPITAL_COMMUNITY): Payer: Commercial Managed Care - HMO

## 2016-11-19 ENCOUNTER — Encounter: Payer: Commercial Managed Care - HMO | Attending: Family Medicine | Admitting: Skilled Nursing Facility1

## 2016-11-19 DIAGNOSIS — E119 Type 2 diabetes mellitus without complications: Secondary | ICD-10-CM | POA: Diagnosis not present

## 2016-11-19 DIAGNOSIS — Z713 Dietary counseling and surveillance: Secondary | ICD-10-CM | POA: Diagnosis not present

## 2016-11-19 DIAGNOSIS — K2970 Gastritis, unspecified, without bleeding: Secondary | ICD-10-CM | POA: Insufficient documentation

## 2016-11-19 DIAGNOSIS — R682 Dry mouth, unspecified: Secondary | ICD-10-CM | POA: Insufficient documentation

## 2016-11-20 ENCOUNTER — Encounter: Payer: Self-pay | Admitting: Skilled Nursing Facility1

## 2016-11-20 ENCOUNTER — Ambulatory Visit
Admission: RE | Admit: 2016-11-20 | Discharge: 2016-11-20 | Disposition: A | Discharge status: Home / Self Care | Payer: Commercial Managed Care - HMO | Source: Ambulatory Visit | Attending: Family Medicine | Admitting: Family Medicine

## 2016-11-20 DIAGNOSIS — Z1231 Encounter for screening mammogram for malignant neoplasm of breast: Secondary | ICD-10-CM | POA: Diagnosis not present

## 2016-11-20 NOTE — Progress Notes (Signed)
Patient was seen on 11/19/2016 for the first of a series of three diabetes self-management courses at the [facility].  Patient Education Plan per assessed needs and concerns is to attend four course education program for Diabetes Self Management Education.  Pt is inappropriate for class due to her swallowing/wt loss issues so she was made a one on one appointment for follow up.   The following learning objectives were met by the patient during this class:  Describe diabetes  State some common risk factors for diabetes  Defines the role of glucose and insulin  Identifies type of diabetes and pathophysiology  Describe the relationship between diabetes and cardiovascular risk  State the members of the Healthcare Team  States the rationale for glucose monitoring  State when to test glucose  State their individual Target Range  State the importance of logging glucose readings  Describe how to interpret glucose readings  Identifies A1C target  Explain the correlation between A1c and eAG values  State symptoms and treatment of high blood glucose  State symptoms and treatment of low blood glucose  Explain proper technique for glucose testing  Identifies proper sharps disposal  Handouts given during class include:  Living Well with Diabetes book  Carb Counting and Meal Planning book  Meal Plan Card  Carbohydrate guide  Meal planning worksheet  Low Sodium Flavoring Tips  The diabetes portion plate  T0A to eAG Conversion Chart  Diabetes Medications  Diabetes Recommended Care Schedule  Support Group  Diabetes Success Plan  Core Class Satisfaction Survey  Follow-Up Plan:  Attend core 2

## 2016-11-26 ENCOUNTER — Encounter: Payer: Self-pay | Admitting: Skilled Nursing Facility1

## 2016-11-26 ENCOUNTER — Encounter: Payer: Commercial Managed Care - HMO | Admitting: Skilled Nursing Facility1

## 2016-11-26 ENCOUNTER — Ambulatory Visit: Payer: Commercial Managed Care - HMO

## 2016-11-26 DIAGNOSIS — E119 Type 2 diabetes mellitus without complications: Secondary | ICD-10-CM | POA: Diagnosis not present

## 2016-11-26 DIAGNOSIS — Z713 Dietary counseling and surveillance: Secondary | ICD-10-CM | POA: Diagnosis not present

## 2016-11-26 DIAGNOSIS — R682 Dry mouth, unspecified: Secondary | ICD-10-CM | POA: Diagnosis not present

## 2016-11-26 DIAGNOSIS — K2970 Gastritis, unspecified, without bleeding: Secondary | ICD-10-CM | POA: Diagnosis not present

## 2016-11-26 NOTE — Progress Notes (Signed)
Pt arrives having lost 2 pounds. Pt states she is not taking any medications for her diabetes. Pt states she takes her blood sugar once every day in the morning. Pt states the protonix have been helping. Pt states when she tries to eat it feels like it goes very slow and then it feels like she is getting choked and then results in indigestion. Pt states when she is feeling lumps on her neck and just below her ears and constantly feels like someone is pushing their finger down on her throat. Pt states she has had the barium swallow with no strictures. Pt states she has no trouble with drinks. Pt states at the end of the day she feels like there is a lump of food in her chest. Pt states she has gastritis. Pt states she has severe dry mouth.   Diet Recall: Since Early November  Morning: soup----1 egg white with a lot of oil  1 Vanilla Pudding

## 2016-11-26 NOTE — Patient Instructions (Addendum)
-  Always bring your meter with you everywhere you go -Always Properly dispose of your needles:  -Discard in a hard plastic/metal container with a lid (something the needle can't puncture)  -Write Do Not Recycle on the outside of the container  -Example: A laundry detergent bottle -Never use the same needle more than once  -Try to eat 5 small meals throughout the day  -Try to be more active -Always pay attention to your body keeping watchful of possible low blood sugar (below 70) or high blood sugar (above 200)   -Moisten all of your foods   -Aim for the creamier heavier soups like made with cream of mushroom soup

## 2016-12-03 ENCOUNTER — Ambulatory Visit: Payer: Commercial Managed Care - HMO

## 2016-12-06 DIAGNOSIS — R1310 Dysphagia, unspecified: Secondary | ICD-10-CM | POA: Diagnosis not present

## 2016-12-06 DIAGNOSIS — E785 Hyperlipidemia, unspecified: Secondary | ICD-10-CM | POA: Diagnosis not present

## 2016-12-06 DIAGNOSIS — E119 Type 2 diabetes mellitus without complications: Secondary | ICD-10-CM | POA: Diagnosis not present

## 2016-12-06 DIAGNOSIS — I10 Essential (primary) hypertension: Secondary | ICD-10-CM | POA: Diagnosis not present

## 2016-12-10 ENCOUNTER — Other Ambulatory Visit: Payer: Self-pay | Admitting: Family Medicine

## 2016-12-10 DIAGNOSIS — R1310 Dysphagia, unspecified: Secondary | ICD-10-CM

## 2016-12-13 ENCOUNTER — Other Ambulatory Visit: Payer: Commercial Managed Care - HMO

## 2016-12-16 ENCOUNTER — Ambulatory Visit
Admission: RE | Admit: 2016-12-16 | Discharge: 2016-12-16 | Disposition: A | Discharge status: Home / Self Care | Payer: Commercial Managed Care - HMO | Source: Ambulatory Visit | Attending: Family Medicine | Admitting: Family Medicine

## 2016-12-16 DIAGNOSIS — R221 Localized swelling, mass and lump, neck: Secondary | ICD-10-CM | POA: Diagnosis not present

## 2016-12-16 DIAGNOSIS — R1310 Dysphagia, unspecified: Secondary | ICD-10-CM

## 2016-12-19 ENCOUNTER — Ambulatory Visit: Payer: Commercial Managed Care - HMO | Admitting: Skilled Nursing Facility1

## 2016-12-19 DIAGNOSIS — K219 Gastro-esophageal reflux disease without esophagitis: Secondary | ICD-10-CM | POA: Diagnosis not present

## 2016-12-19 DIAGNOSIS — R634 Abnormal weight loss: Secondary | ICD-10-CM | POA: Diagnosis not present

## 2016-12-19 DIAGNOSIS — R1310 Dysphagia, unspecified: Secondary | ICD-10-CM | POA: Diagnosis not present

## 2016-12-20 ENCOUNTER — Encounter: Payer: Commercial Managed Care - HMO | Attending: Family Medicine | Admitting: Skilled Nursing Facility1

## 2016-12-20 ENCOUNTER — Encounter: Payer: Self-pay | Admitting: Skilled Nursing Facility1

## 2016-12-20 ENCOUNTER — Other Ambulatory Visit (HOSPITAL_COMMUNITY): Payer: Self-pay | Admitting: Gastroenterology

## 2016-12-20 DIAGNOSIS — K2970 Gastritis, unspecified, without bleeding: Secondary | ICD-10-CM | POA: Insufficient documentation

## 2016-12-20 DIAGNOSIS — R682 Dry mouth, unspecified: Secondary | ICD-10-CM | POA: Insufficient documentation

## 2016-12-20 DIAGNOSIS — E119 Type 2 diabetes mellitus without complications: Secondary | ICD-10-CM

## 2016-12-20 DIAGNOSIS — Z713 Dietary counseling and surveillance: Secondary | ICD-10-CM | POA: Diagnosis not present

## 2016-12-20 DIAGNOSIS — R1319 Other dysphagia: Secondary | ICD-10-CM

## 2016-12-20 NOTE — Patient Instructions (Addendum)
-  Start off your day with a supplement shake and pureed fruit -Try to eat/drink something every 3 hours -end your day with a supplement shake and pureed fruit (hopefully 5 oclock)  -In between try molasses in your hot tea and any other thin soup consistency foods you want  -Vegetables: cooked very soft and mashed very well: carrots, canned green beans, cauliflower, remove the seeds and try squash   -Moisten all of your foods  -Aim for the creamier heavier soups like made with cream of mushroom soup  -when you find a nutrition supplement add water to it if it is too thick  -Mashed cooked carrots with white potatoes and milk Broccoli and cheese soup  -glucerna  -Try to have 2 servings of soft pureed fruit a day   -Avoid crackers and bread  -Talk to your doctor about getting liquid or powder form medication   -Puree your soups before eating them or focus on tomato soups: thin your soups out with milk or thin protein drink  -Talk to your doctor about getting you cholesterol medicine you can toelrate  -No skins no peels  -Salt free vegetable juice is okay  -Soy milk can be a substitute for whole milk   -Add some oil to your hot dishes-a teaspoon

## 2016-12-20 NOTE — Progress Notes (Signed)
  Pt returns having lost 4 pounds since her last visit in November totaling to 6 pounds lost. Pt states she had a visit with her gastroenterologist which resulted in a referral to the ENT. Pt states she does have some plaque build up in her arteries. Pts sugar numbers: 77, 118, 100, 106 before and after she eats. Pt states she thinks her protonix is helping her with her symptoms. Pt states yesterday what she ate feels like it is in her chest now. Pt states she cannot tolerate greek yogurt due to its thick consistency. Pt states she cannot take her cholesterol medicine because she was allergic to it. Pt states she cannot tolerate her pill form medication.  Pt states she is also struggling with dry skin. Dietitian had pt taste protein supplements which the pt enjoyed and also gained relief of symptoms as well as belching which relived her chest pain thoroughly, pt was very happy.  Dietitian educated the pt on food preparation for what she can tolerate, her nutrient needs including protein, fiber, and essential fatty acids. Recall: Oatmeal glucerna Mashed cooked carrots with white potatoes and milk Broccoli and cheese soup Quinoa Crackers Cooked fruit  Goals: -Start off your day with a supplement shake and pureed fruit -Try to eat/drink something every 3 hours -end your day with a supplement shake and pureed fruit (hopefully 5 oclock)  -In between try molasses in your hot tea and any other thin soup consistency foods you want  -Vegetables: cooked very soft and mashed very well: carrots, canned green beans, cauliflower, remove the seeds and try squash   -Moisten all of your foods  -Aim for the creamier heavier soups like made with cream of mushroom soup  -when you find a nutrition supplement add water to it if it is too thick  -Mashed cooked carrots with white potatoes and milk Broccoli and cheese soup  -glucerna  -Try to have 2 servings of soft pureed fruit a day   -Avoid crackers and  bread  -Talk to your doctor about getting liquid or powder form medication   -Puree your soups before eating them or focus on tomato soups: thin your soups out with milk or thin protein drink  -Talk to your doctor about getting you cholesterol medicine you can toelrate  -No skins no peels  -Salt free vegetable juice is okay  -Soy milk can be a substitute for whole milk   -Add some oil to your hot dishes-a teaspoon

## 2017-01-01 ENCOUNTER — Ambulatory Visit (HOSPITAL_COMMUNITY)
Admission: RE | Admit: 2017-01-01 | Discharge: 2017-01-01 | Disposition: A | Discharge status: Home / Self Care | Payer: Commercial Managed Care - HMO | Source: Ambulatory Visit | Attending: Gastroenterology | Admitting: Gastroenterology

## 2017-01-01 DIAGNOSIS — I119 Hypertensive heart disease without heart failure: Secondary | ICD-10-CM | POA: Insufficient documentation

## 2017-01-01 DIAGNOSIS — R1310 Dysphagia, unspecified: Secondary | ICD-10-CM | POA: Diagnosis present

## 2017-01-01 DIAGNOSIS — E559 Vitamin D deficiency, unspecified: Secondary | ICD-10-CM | POA: Insufficient documentation

## 2017-01-01 DIAGNOSIS — E119 Type 2 diabetes mellitus without complications: Secondary | ICD-10-CM | POA: Insufficient documentation

## 2017-01-01 DIAGNOSIS — F419 Anxiety disorder, unspecified: Secondary | ICD-10-CM | POA: Diagnosis not present

## 2017-01-01 DIAGNOSIS — R1319 Other dysphagia: Secondary | ICD-10-CM | POA: Insufficient documentation

## 2017-01-01 DIAGNOSIS — D649 Anemia, unspecified: Secondary | ICD-10-CM | POA: Diagnosis not present

## 2017-01-01 DIAGNOSIS — K219 Gastro-esophageal reflux disease without esophagitis: Secondary | ICD-10-CM | POA: Insufficient documentation

## 2017-01-03 ENCOUNTER — Ambulatory Visit: Payer: Commercial Managed Care - HMO | Admitting: Neurology

## 2017-01-06 DIAGNOSIS — R634 Abnormal weight loss: Secondary | ICD-10-CM | POA: Diagnosis not present

## 2017-01-06 DIAGNOSIS — R1310 Dysphagia, unspecified: Secondary | ICD-10-CM | POA: Diagnosis not present

## 2017-01-29 DIAGNOSIS — Z880 Allergy status to penicillin: Secondary | ICD-10-CM | POA: Diagnosis not present

## 2017-01-29 DIAGNOSIS — Z886 Allergy status to analgesic agent status: Secondary | ICD-10-CM | POA: Diagnosis not present

## 2017-01-29 DIAGNOSIS — J383 Other diseases of vocal cords: Secondary | ICD-10-CM | POA: Diagnosis not present

## 2017-01-29 DIAGNOSIS — Z79899 Other long term (current) drug therapy: Secondary | ICD-10-CM | POA: Diagnosis not present

## 2017-01-29 DIAGNOSIS — R490 Dysphonia: Secondary | ICD-10-CM | POA: Diagnosis not present

## 2017-01-29 DIAGNOSIS — F458 Other somatoform disorders: Secondary | ICD-10-CM | POA: Diagnosis not present

## 2017-01-29 DIAGNOSIS — J384 Edema of larynx: Secondary | ICD-10-CM | POA: Diagnosis not present

## 2017-01-29 DIAGNOSIS — Z885 Allergy status to narcotic agent status: Secondary | ICD-10-CM | POA: Diagnosis not present

## 2017-01-29 DIAGNOSIS — E1142 Type 2 diabetes mellitus with diabetic polyneuropathy: Secondary | ICD-10-CM | POA: Diagnosis not present

## 2017-01-29 DIAGNOSIS — I10 Essential (primary) hypertension: Secondary | ICD-10-CM | POA: Diagnosis not present

## 2017-01-29 DIAGNOSIS — R1310 Dysphagia, unspecified: Secondary | ICD-10-CM | POA: Diagnosis not present

## 2017-01-29 DIAGNOSIS — E785 Hyperlipidemia, unspecified: Secondary | ICD-10-CM | POA: Diagnosis not present

## 2017-02-05 ENCOUNTER — Encounter: Payer: Self-pay | Admitting: Neurology

## 2017-02-05 ENCOUNTER — Other Ambulatory Visit (INDEPENDENT_AMBULATORY_CARE_PROVIDER_SITE_OTHER): Payer: Medicare HMO

## 2017-02-05 ENCOUNTER — Ambulatory Visit (INDEPENDENT_AMBULATORY_CARE_PROVIDER_SITE_OTHER): Payer: Medicare HMO | Admitting: Neurology

## 2017-02-05 VITALS — BP 126/70 | HR 49 | Ht 64.0 in | Wt 120.9 lb

## 2017-02-05 DIAGNOSIS — G43109 Migraine with aura, not intractable, without status migrainosus: Secondary | ICD-10-CM

## 2017-02-05 DIAGNOSIS — E1142 Type 2 diabetes mellitus with diabetic polyneuropathy: Secondary | ICD-10-CM

## 2017-02-05 LAB — TSH: TSH: 2.82 u[IU]/mL (ref 0.35–4.50)

## 2017-02-05 LAB — SEDIMENTATION RATE: SED RATE: 11 mm/h (ref 0–30)

## 2017-02-05 NOTE — Progress Notes (Addendum)
NEUROLOGY FOLLOW UP OFFICE NOTE  JAHMAYA SIEMER UA:[id number]  HISTORY OF PRESENT ILLNESS: Eiley Malcolm is a 76 year old right-handed woman with hypertension, thrombocytopenia, diabetes, peripheral neuropathy, hyperlipidemia, vitamin D deficiency and aortic atherosclerosis whom I previously saw for ocular migraines now presents for evaluation of her peripheral neuropathy.  History supplemented by PCP note.  She has had neuropathy for 4 or 5 years.  She has had workup in the past by several neurologists, including multiple NCV-EMGs and blood work.  No specific etiology was ever found.  About 2 years ago, she was diagnosed with diabetes.  Last Hgb A1c from September was either 6.8.  Previously, they have been around 6 to 6.5.  She has had a change in her diabetes medication and has seen a nutritionist and changed her diet.  She reports severe numbness on the bottom of her feet and she also feels in over the dorsum of her feet as well.  When she walks, she sometimes walks on the side of her feet because she would otherwise not feel the ground.  She denies radicular pain down the legs.  She denies pain in the feet or paresthesias.  She denies recent falls.  She sometimes has trouble feeling the brake or gas pedal in the car.  Labs from 09/24/16 include BMP with Na 138, K 3.7, glucose 115, BUN 16, Cr 0.98; CBC with WBC 5.8, HGB 12.8, HCT 40.8 and PLT 134; Hgb A1c 6.8; B12 636.  She has had 9 ocular migraines over the past year, 4 over the past month, lasting about 3 minutes. She reports feeling ill last month, which may have triggered increased frequency.  They resolve on their own.  PAST MEDICAL HISTORY: Past Medical History:  Diagnosis Date  . Anemia   . Anxiety   . Atherosclerosis   . Back pain   . Coronary artery disease   . GERD (gastroesophageal reflux disease)   . HTN (hypertension) 12/03/2013   CT scan-no pheochromocytoma  . Hyperlipidemia   . Hypertension   . Hypertensive  heart disease without heart failure   . Thrombocytopenia ([place])   . Vitamin D deficiency     MEDICATIONS: Current Outpatient Prescriptions on File Prior to Visit  Medication Sig Dispense Refill  . albuterol (PROVENTIL HFA;VENTOLIN HFA) 108 (90 Base) MCG/ACT inhaler Inhale 2 puffs into the lungs every 4 (four) hours as needed for wheezing or shortness of breath. 1 Inhaler 0  . amLODipine (NORVASC) 2.5 MG tablet Take 2.5 mg by mouth daily.     [name] aspirin 81 MG tablet Take 81 mg by mouth daily as needed for pain.     . ciprofloxacin (CIPRO) 500 MG tablet Take 1 tablet (500 mg total) by mouth 2 (two) times daily. 14 tablet 0  . docusate sodium (COLACE) 100 MG capsule Take 100 mg by mouth daily as needed for mild constipation.    [name] EPINEPHrine 0.3 mg/0.3 mL IJ SOAJ injection Inject 0.3 mg into the muscle once.  1  . famotidine (PEPCID) 20 MG tablet Take 1 tablet (20 mg total) by mouth 2 (two) times daily. (Patient taking differently: Take 20 mg by mouth at bedtime. ) 30 tablet 0  . LORazepam (ATIVAN) 1 MG tablet Take 0.5 tablets (0.5 mg total) by mouth every 8 (eight) hours as needed for anxiety. 10 tablet 0  . metFORMIN (GLUCOPHAGE-XR) 500 MG 24 hr tablet Take 500 mg by mouth every evening.    . metoprolol succinate (TOPROL-XL) 50 MG  24 hr tablet Take 50 mg by mouth daily.   5   No current facility-administered medications on file prior to visit.     ALLERGIES: Allergies  Allergen Reactions  . Acetaminophen Other (See Comments)    Tremor, anxiety  . Beef-Derived Products Anaphylaxis  . Codeine Hives and Anxiety  . Epinephrine Palpitations  . Latex Rash  . Morphine And Related Other (See Comments)    Tremors, increased heart rate, excitement, confusion per pt  . Penicillins Hives  . Sertraline Other (See Comments)    Numbness in mouth and hyperactivity  . Solu-Medrol [Methylprednisolone] Other (See Comments)    Numbness and tingling in mouth  . Barbiturates Other (See Comments)     excitement  . Bee Pollen Hives  . Bee Venom Hives  . Diovan [Valsartan] Nausea Only  . Gabapentin Other (See Comments)    Visual disturbance  . Ivp Dye [Iodinated Diagnostic Agents] Other (See Comments)    ANAPHYLACTIC  . Livalo [Pitavastatin] Swelling and Other (See Comments)    Facial swelling  . Nutritional Supplements Other (See Comments)    Nuts, strawberries, bicarbonate of soda,  . Other Other (See Comments)    NARCOTICS ANALGESICS-TREMORS, INCREASED HEART RATE Hmg-Coa Reductase Inhibitors - intolerance unk   . Oxycodone Swelling, Rash and Cough  . Promethazine-Dm Other (See Comments)    "Felt funny"  . Shellfish Allergy Hives  . Tape Other (See Comments)    Red blotches  . Celebrex [Celecoxib] Other (See Comments)    Unknown  . Lanolin Itching    Unknown  . Mobic [Meloxicam] Other (See Comments)    Unknown  . Nitrates, Organic Rash    Chest tightness  . Petroleum Jelly [Petrolatum] Rash  . Soy Allergy Rash  . Strawberry Extract Rash  . Sulfa Antibiotics Hives and Rash  . Tylox [Oxycodone-Acetaminophen] Swelling, Rash and Cough    FAMILY HISTORY: Family History  Problem Relation Age of Onset  . Cancer Father     SOCIAL HISTORY: Social History   Social History  . Marital status: Married    Spouse name: N/A  . Number of children: N/A  . Years of education: N/A   Occupational History  . Not on file.   Social History Main Topics  . Smoking status: Never Smoker  . Smokeless tobacco: Never Used  . Alcohol use No  . Drug use: No  . Sexual activity: No   Other Topics Concern  . Not on file   Social History Narrative  . No narrative on file    REVIEW OF SYSTEMS: Constitutional: No fevers, chills, or sweats, no generalized fatigue, change in appetite Eyes: No visual changes, double vision, eye pain Ear, nose and throat: No hearing loss, ear pain, nasal congestion, sore throat Cardiovascular: No chest pain, palpitations Respiratory:  No shortness  of breath at rest or with exertion, wheezes GastrointestinaI: No nausea, vomiting, diarrhea, abdominal pain, fecal incontinence Genitourinary:  No dysuria, urinary retention or frequency Musculoskeletal:  No neck pain, back pain Integumentary: No rash, pruritus, skin lesions Neurological: as above Psychiatric: No depression, insomnia, anxiety Endocrine: No palpitations, fatigue, diaphoresis, mood swings, change in appetite, change in weight, increased thirst Hematologic/Lymphatic:  No purpura, petechiae. Allergic/Immunologic: no itchy/runny eyes, nasal congestion, recent allergic reactions, rashes  PHYSICAL EXAM: Vitals:   02/05/17 1124  BP: 126/70  Pulse: (!) 49   General: No acute distress.  Patient appears well-groomed.  normal body habitus. Head:  Normocephalic/atraumatic Eyes:  Fundi examined but not visualized  Neck: supple, no paraspinal tenderness, full range of motion Heart:  Regular rate and rhythm Lungs:  Clear to auscultation bilaterally Back: No paraspinal tenderness Neurological Exam: alert and oriented to person, place, and time. Attention span and concentration intact, recent and remote memory intact, fund of knowledge intact.  Speech fluent and not dysarthric, language intact.  CN II-XII intact. Bulk and tone normal, muscle strength 5/5 in upper extremities, 4+/5 lower extremities.  Deep tendon reflexes absent throughout.  Decreased temperature sensation on bottom of feet.  Decreased vibration sensation up to below the knees bilaterally.  Finger to nose testing intact.  Gait wide-based, cautious.  Romberg positive.  IMPRESSION: 1.  Peripheral neuropathy, idiopathic versus diabetic.  It could very well be diabetic, however her diabetes has been fairly well controlled and she endorses that the numbness has gotten worse. 2.  Ocular migraines.  PLAN: 1.  Will check ANA, Sed Rate and TSH to evaluate for any other potential cause of neuropathy 2.  Will refer to PT for gait  instability and leg weakness 3.  Will monitor ocular migraines.  If they continue to be frequent, she will contact us and we can consider a preventative medication.  28 minutes spent face to face with patient, over 50% spent discussing etiology of neuropathy and plan.  Metta Clines, DO  CC: Harlan Stains, MD

## 2017-02-05 NOTE — Patient Instructions (Signed)
1.  We will check ANA, Sed Rate, TSH 2.  We will refer you to physical therapy 3.  Continue working on getting sugars under control 4.  If migraines get worse, contact me and we can start a daily medication (usually an antidepressant) and have you follow up.

## 2017-02-07 LAB — ANTI-NUCLEAR AB-TITER (ANA TITER)

## 2017-02-07 LAB — ANA: Anti Nuclear Antibody(ANA): POSITIVE — AB

## 2017-02-11 ENCOUNTER — Telehealth: Payer: Self-pay | Admitting: Neurology

## 2017-02-11 DIAGNOSIS — R768 Other specified abnormal immunological findings in serum: Secondary | ICD-10-CM

## 2017-02-11 NOTE — Telephone Encounter (Signed)
Donna Baldwin [birth year]. Her # [phone] cell / [phone] Home. She was calling regarding her test results from her labs last week. Also, she was to have therapy on her feet and has lost the # to [facility]. Thank you

## 2017-02-11 NOTE — Telephone Encounter (Addendum)
Spoke to patient. Gave lab results and referral instructions. Gave patient [facility] phone#. Placed referral. Patient verbalized understanding.

## 2017-02-11 NOTE — Telephone Encounter (Signed)
I reviewed the labs.  Her ANA is positive with a high titer.  It may be of no clinical significance, but I would like to refer her to rheumatology for evaluation of positive ANA with neuropathy.  She has diabetes but it has been under relative control (Hgb A1c around 6.8).  My suspicion is low, but this may be suggestive of lupus.

## 2017-02-19 DIAGNOSIS — H02834 Dermatochalasis of left upper eyelid: Secondary | ICD-10-CM | POA: Diagnosis not present

## 2017-02-19 DIAGNOSIS — H25812 Combined forms of age-related cataract, left eye: Secondary | ICD-10-CM | POA: Diagnosis not present

## 2017-02-19 DIAGNOSIS — H02831 Dermatochalasis of right upper eyelid: Secondary | ICD-10-CM | POA: Diagnosis not present

## 2017-02-19 DIAGNOSIS — H04123 Dry eye syndrome of bilateral lacrimal glands: Secondary | ICD-10-CM | POA: Diagnosis not present

## 2017-02-19 DIAGNOSIS — E119 Type 2 diabetes mellitus without complications: Secondary | ICD-10-CM | POA: Diagnosis not present

## 2017-03-06 DIAGNOSIS — E559 Vitamin D deficiency, unspecified: Secondary | ICD-10-CM | POA: Diagnosis not present

## 2017-03-06 DIAGNOSIS — E119 Type 2 diabetes mellitus without complications: Secondary | ICD-10-CM | POA: Diagnosis not present

## 2017-03-06 DIAGNOSIS — D696 Thrombocytopenia, unspecified: Secondary | ICD-10-CM | POA: Diagnosis not present

## 2017-03-06 DIAGNOSIS — E785 Hyperlipidemia, unspecified: Secondary | ICD-10-CM | POA: Diagnosis not present

## 2017-03-06 DIAGNOSIS — I10 Essential (primary) hypertension: Secondary | ICD-10-CM | POA: Diagnosis not present

## 2017-03-06 DIAGNOSIS — F411 Generalized anxiety disorder: Secondary | ICD-10-CM | POA: Diagnosis not present

## 2017-03-06 DIAGNOSIS — R4702 Dysphasia: Secondary | ICD-10-CM | POA: Diagnosis not present

## 2017-03-06 DIAGNOSIS — M79644 Pain in right finger(s): Secondary | ICD-10-CM | POA: Diagnosis not present

## 2017-04-01 DIAGNOSIS — Z8639 Personal history of other endocrine, nutritional and metabolic disease: Secondary | ICD-10-CM | POA: Insufficient documentation

## 2017-04-01 NOTE — Progress Notes (Signed)
Office Visit Note  Patient: Donna Baldwin             Date of Birth: 10/07/1941           MRN: [id number]             PCP: Vidal Schwalbe, MD Referring: Harlan Stains, MD Visit Date: 04/03/2017 Occupation: @GUAROCC @    Subjective:  Numbness and bilateral feet   History of Present Illness: Donna Baldwin is a 76 y.o. female gain in consultation per request of her neurologist. According to patient she has had history of peripheral neuropathy for multiple years. She has had extensive workup by different physicians in the past. She states she has numbness from the midshin area all the way down to her feet. She denies any discomfort in her ankles or feet. She does have chronic lower back pain for which she seen an orthopedic doctor in the past and was diagnosed with a spondylosis. She denies any joint swelling or joint pain. She states she has right trigger thumb for which she is seen an orthopedic doctor and will require surgery. She failed cortisone injection 2.  Activities of Daily Living:  Patient reports morning stiffness for 0 minute.   Patient Reports nocturnal pain. Due to neuropathy Difficulty dressing/grooming: Denies Difficulty climbing stairs: Denies Difficulty getting out of chair: Reports Difficulty using hands for taps, buttons, cutlery, and/or writing: Denies   Review of Systems  Constitutional: Negative for fatigue, night sweats, weight gain, weight loss and weakness.  HENT: Positive for mouth dryness. Negative for mouth sores, trouble swallowing, trouble swallowing and nose dryness.        Rhinorrhea  Eyes: Negative for pain, redness, visual disturbance and dryness.  Respiratory: Negative for cough, shortness of breath and difficulty breathing.   Cardiovascular: Negative for chest pain, palpitations, hypertension, irregular heartbeat and swelling in legs/feet.  Gastrointestinal: Negative for blood in stool, constipation and diarrhea.  Endocrine:  Negative for increased urination.  Genitourinary: Negative for vaginal dryness.  Musculoskeletal: Positive for arthralgias and joint pain. Negative for joint swelling, myalgias, muscle weakness, morning stiffness, muscle tenderness and myalgias.  Skin: Negative for color change, rash, hair loss, skin tightness, ulcers and sensitivity to sunlight.  Allergic/Immunologic: Negative for susceptible to infections.  Neurological: Negative for dizziness, memory loss and night sweats.  Hematological: Negative for swollen glands.  Psychiatric/Behavioral: Negative for depressed mood and sleep disturbance. The patient is nervous/anxious.     PMFS History:  Patient Active Problem List   Diagnosis Date Noted  . History of diabetes mellitus 04/01/2017  . 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 Hereditary and idiopathic peripheral neuropathy 07/07/2014  . Aortic atherosclerosis ([place]) 12/03/2013  . Familial hyperlipidemia 12/03/2013  . HTN (hypertension) 12/03/2013  . GERD (gastroesophageal reflux disease) 03/24/2013  . Obstructive sleep apnea, ruled out 02/28/2013  . 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 Urticaria due to food allergy 08/31/2012  . Thrombocytopenia ([place])     Past Medical History:  Diagnosis Date  . Anemia   . Anxiety   . Atherosclerosis   . Back pain   . Coronary artery disease   . GERD (gastroesophageal reflux disease)   . HTN (hypertension) 12/03/2013   CT scan-no pheochromocytoma  . Hyperlipidemia   . Hypertension   . Hypertensive heart disease without heart failure   . Thrombocytopenia ([place])   . Vitamin D deficiency     Family History  Problem Relation Age of Onset  . Cancer Father  Past Surgical History:  Procedure Laterality Date  . SHOULDER SURGERY Bilateral   . TONSILLECTOMY     Social History   Social History Narrative  . No narrative on file     Objective: Vital Signs: BP 130/64 (BP Location: Right Arm)   Pulse 62   Resp 14    Ht 5\' 4"  (1.626 m)   Wt 124 lb (56.2 kg)   BMI 21.28 kg/m    Physical Exam  Constitutional: She is oriented to person, place, and time. She appears well-developed and well-nourished.  HENT:  Head: Normocephalic and atraumatic.  Eyes: Conjunctivae and EOM are normal.  Neck: Normal range of motion.  Cardiovascular: Normal rate, regular rhythm, normal heart sounds and intact distal pulses.   Pulmonary/Chest: Effort normal and breath sounds normal.  Abdominal: Soft. Bowel sounds are normal.  Lymphadenopathy:    She has no cervical adenopathy.  Neurological: She is alert and oriented to person, place, and time.  Skin: Skin is warm and dry. Capillary refill takes less than 2 seconds.  Psychiatric: She has a normal mood and affect. Her behavior is normal.  Nursing note and vitals reviewed.    Musculoskeletal Exam: C-spine and thoracic spine good range of motion she is painful lumbar spine range of motion. Shoulder joints elbow joints wrist joint MCPs PIPs DIPs with good range of motion. She is some thickening of PIP joints bilaterally with tenderness. She has right thumb trigger. Hip joints knee joints ankles MTPs PIPs with good range of motion. She is some tenderness across her PIPs of her feet. She appears to have hammertoes bilaterally.  CDAI Exam: No CDAI exam completed.    Investigation: Findings:  July 2017 CMP normal, September 2017 CBC platelets 134, BMP glucose 115, GFR 55    Imaging: Xr Foot 2 Views Left  Result Date: 04/03/2017 Right first MTP and all PIP DIP minimal narrowing. No erosive changes were noted. Impression these findings are consistent with mild osteoarthritis of the foot  Xr Foot 2 Views Right  Result Date: 04/03/2017 Right first MTP and all PIP DIP minimal narrowing. No erosive changes were noted. Impression these findings are consistent with mild osteoarthritis of the foot  Xr Hand 2 View Left  Result Date: 04/03/2017 Minimal PIP and CMC narrowing. No  MCP or intercarpal joint space narrowing was noted. No erosive changes were noted. Impression: These findings are consistent with mild osteoarthritis  Xr Hand 2 View Right  Result Date: 04/03/2017 Minimal PIP and CMC narrowing. No MCP or intercarpal joint space narrowing was noted. No erosive changes were noted. Impression: These findings are consistent with mild osteoarthritis   Speciality Comments: No specialty comments available.    Procedures:  No procedures performed Allergies: Acetaminophen; Beef-derived products; Codeine; Epinephrine; Latex; Morphine and related; Penicillins; Sertraline; Solu-medrol [methylprednisolone]; Barbiturates; Bee pollen; Bee venom; Diovan [valsartan]; Gabapentin; Ivp dye [iodinated diagnostic agents]; Livalo [pitavastatin]; Nutritional supplements; Other; Oxycodone; Promethazine-dm; Shellfish allergy; Tape; Celebrex [celecoxib]; Lanolin; Mobic [meloxicam]; Nitrates, organic; Petroleum jelly [petrolatum]; Soy allergy; Strawberry extract; Sulfa antibiotics; and Tylox [oxycodone-acetaminophen]   Assessment / Plan:     Visit Diagnoses: Positive ANA (antinuclear antibody) - 1:160 homogeneous -she clinically does not have any features of autoimmune disease. She is positive ANA and is very concerned that she may have an underlying autoimmune disorder I'll obtain following labs today. Plan: COMPLETE METABOLIC PANEL WITH GFR, Urinalysis, Routine w reflex microscopic, CK, C3 and C4, [id number] ENA PANEL   idiopathic peripheral neuropathy, probably related to her diabetes.  According to patient and her daughter her diabetes is not well controlled. She has nocturnal pain from peripheral neuropathy. - Plan: Serum protein electrophoresis with reflex  Spondylosis of lumbar region without myelopathy or radiculopathy: Chronic pain and discomfort  History of diabetes mellitus  Transient retinal artery occlusion of both eyes  Thrombocytopenia ([facility])  Familial  hyperlipidemia  Essential hypertension  Aortic atherosclerosis ([facility])  Obstructive sleep apnea, ruled out  Pain in both hands - Plan: XR Hand 2 View Right, XR Hand 2 View Left. X-rays consistent with mild osteoarthritis of the hands.  Pain in both feet - Plan: XR Foot 2 Views Right, XR Foot 2 Views Left . X-rays consistent with mild osteoarthritis of the feet.   Orders: Orders Placed This Encounter  Procedures  . XR Hand 2 View Right  . XR Hand 2 View Left  . XR Foot 2 Views Right  . XR Foot 2 Views Left  . COMPLETE METABOLIC PANEL WITH GFR  . Urinalysis, Routine w reflex microscopic  . CK  . Serum protein electrophoresis with reflex  . C3 and C4  . [id number] ENA PANEL   No orders of the defined types were placed in this encounter.   Face-to-face time spent with patient was 50 minutes. 50% of time was spent in counseling and coordination of care.  Follow-Up Instructions: Return for Osteoarthritis.   Bo Merino, MD  Note - This record has been created using Editor, commissioning.  Chart creation errors have been sought, but may not always  have been located. Such creation errors do not reflect on  the standard of medical care.

## 2017-04-03 ENCOUNTER — Ambulatory Visit (INDEPENDENT_AMBULATORY_CARE_PROVIDER_SITE_OTHER): Payer: Medicare HMO

## 2017-04-03 ENCOUNTER — Ambulatory Visit (INDEPENDENT_AMBULATORY_CARE_PROVIDER_SITE_OTHER): Payer: Medicare HMO | Admitting: Rheumatology

## 2017-04-03 VITALS — BP 130/64 | HR 62 | Resp 14 | Ht 64.0 in | Wt 124.0 lb

## 2017-04-03 DIAGNOSIS — M79641 Pain in right hand: Secondary | ICD-10-CM | POA: Diagnosis not present

## 2017-04-03 DIAGNOSIS — M79672 Pain in left foot: Secondary | ICD-10-CM | POA: Diagnosis not present

## 2017-04-03 DIAGNOSIS — M79671 Pain in right foot: Secondary | ICD-10-CM

## 2017-04-03 DIAGNOSIS — D696 Thrombocytopenia, unspecified: Secondary | ICD-10-CM | POA: Diagnosis not present

## 2017-04-03 DIAGNOSIS — M47816 Spondylosis without myelopathy or radiculopathy, lumbar region: Secondary | ICD-10-CM | POA: Diagnosis not present

## 2017-04-03 DIAGNOSIS — Z8639 Personal history of other endocrine, nutritional and metabolic disease: Secondary | ICD-10-CM

## 2017-04-03 DIAGNOSIS — E784 Other hyperlipidemia: Secondary | ICD-10-CM

## 2017-04-03 DIAGNOSIS — G609 Hereditary and idiopathic neuropathy, unspecified: Secondary | ICD-10-CM

## 2017-04-03 DIAGNOSIS — H3403 Transient retinal artery occlusion, bilateral: Secondary | ICD-10-CM

## 2017-04-03 DIAGNOSIS — M79642 Pain in left hand: Secondary | ICD-10-CM

## 2017-04-03 DIAGNOSIS — I10 Essential (primary) hypertension: Secondary | ICD-10-CM

## 2017-04-03 DIAGNOSIS — G4733 Obstructive sleep apnea (adult) (pediatric): Secondary | ICD-10-CM

## 2017-04-03 DIAGNOSIS — I700 Atherosclerosis of aorta: Secondary | ICD-10-CM | POA: Diagnosis not present

## 2017-04-03 DIAGNOSIS — R768 Other specified abnormal immunological findings in serum: Secondary | ICD-10-CM

## 2017-04-03 DIAGNOSIS — E7849 Other hyperlipidemia: Secondary | ICD-10-CM

## 2017-04-03 LAB — COMPLETE METABOLIC PANEL WITH GFR
ALT: 12 U/L (ref 6–29)
AST: 18 U/L (ref 10–35)
Albumin: 4.4 g/dL (ref 3.6–5.1)
Alkaline Phosphatase: 103 U/L (ref 33–130)
BUN: 16 mg/dL (ref 7–25)
CALCIUM: 9.6 mg/dL (ref 8.6–10.4)
CHLORIDE: 107 mmol/L (ref 98–110)
CO2: 26 mmol/L (ref 20–31)
Creat: 0.87 mg/dL (ref 0.60–0.93)
GFR, EST AFRICAN AMERICAN: 75 mL/min (ref >=60)
GFR, EST NON AFRICAN AMERICAN: 65 mL/min (ref >=60)
Glucose, Bld: 81 mg/dL (ref 65–99)
Potassium: 4.5 mmol/L (ref 3.5–5.3)
Sodium: 143 mmol/L (ref 135–146)
Total Bilirubin: 0.5 mg/dL (ref 0.2–1.2)
Total Protein: 6.9 g/dL (ref 6.1–8.1)

## 2017-04-04 LAB — URINALYSIS, ROUTINE W REFLEX MICROSCOPIC
Bilirubin Urine: NEGATIVE
Glucose, UA: NEGATIVE
HGB URINE DIPSTICK: NEGATIVE
KETONES UR: NEGATIVE
LEUKOCYTES UA: NEGATIVE
NITRITE: NEGATIVE
PH: 6 (ref 5.0–8.0)
Protein, ur: NEGATIVE
SPECIFIC GRAVITY, URINE: 1.005 (ref 1.001–1.035)

## 2017-04-04 LAB — CP5000020 ENA PANEL
ENA SM AB SER-ACNC: NEGATIVE
Ribonucleic Protein(ENA) Antibody, IgG: <1
SCLERODERMA (SCL-70) (ENA) ANTIBODY, IGG: NEGATIVE
SSA (Ro) (ENA) Antibody, IgG: <1
SSB (La) (ENA) Antibody, IgG: <1
ds DNA Ab: <1 IU/mL

## 2017-04-04 LAB — C3 AND C4
C3 COMPLEMENT: 120 mg/dL (ref 83–193)
C4 COMPLEMENT: 30 mg/dL (ref 15–57)

## 2017-04-04 LAB — CK: CK TOTAL: 120 U/L (ref 7–177)

## 2017-04-07 LAB — PROTEIN ELECTROPHORESIS, SERUM, WITH REFLEX
ALPHA-1-GLOBULIN: 0.3 g/dL (ref 0.2–0.3)
Albumin ELP: 4.3 g/dL (ref 3.8–4.8)
Alpha-2-Globulin: 0.7 g/dL (ref 0.5–0.9)
BETA 2: 0.4 g/dL (ref 0.2–0.5)
Beta Globulin: 0.4 g/dL (ref 0.4–0.6)
GAMMA GLOBULIN: 0.9 g/dL (ref 0.8–1.7)
TOTAL PROTEIN, SERUM ELECTROPHOR: 6.9 g/dL (ref 6.1–8.1)

## 2017-04-07 NOTE — Progress Notes (Signed)
WNL

## 2017-04-28 NOTE — Progress Notes (Signed)
Office Visit Note  Patient: Donna Baldwin             Date of Birth: [birth year]           MRN: [id number]             PCP: Vidal Schwalbe, MD Referring: Harlan Stains, MD Visit Date: 05/02/2017 Occupation: @GUAROCC @    Subjective:  Bilateral feet   History of Present Illness: Donna Baldwin is a 76 y.o. female with history of him osteoarthritis and positive ANA. She states she's been experiencing some numbness, swelling, hyperpigmentation of toes bilaterally . She also notices some swelling in her hands. She has periodic burning sensation on top of her feet.    Activities of Daily Living:  Patient reports morning stiffness for 5 minutes.   Patient Reports nocturnal pain.  Difficulty dressing/grooming: Denies Difficulty climbing stairs: Denies Difficulty getting out of chair: Denies Difficulty using hands for taps, buttons, cutlery, and/or writing: Reports   Review of Systems  Constitutional: Negative for fatigue, weight gain, weight loss and weakness.  HENT: Positive for mouth dryness. Negative for mouth sores and nose dryness.   Eyes: Negative for pain, redness and dryness.  Respiratory: Negative for cough, shortness of breath and difficulty breathing.   Cardiovascular: Positive for hypertension. Negative for chest pain, palpitations, irregular heartbeat and swelling in legs/feet.  Gastrointestinal: Negative for constipation, diarrhea and vomiting.  Genitourinary: Negative for painful urination.  Musculoskeletal: Positive for arthralgias and joint pain. Negative for joint swelling, myalgias, morning stiffness and myalgias.  Skin: Positive for color change and sensitivity to sunlight. Negative for rash, skin tightness and ulcers.  Hematological: Negative for swollen glands.  Psychiatric/Behavioral: Negative for depressed mood and sleep disturbance. The patient is not nervous/anxious.     PMFS History:  Patient Active Problem List   Diagnosis Date Noted  .  History of diabetes mellitus 04/01/2017  . 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 Hereditary and idiopathic peripheral neuropathy 07/07/2014  . Aortic atherosclerosis ([place]) 12/03/2013  . Familial hyperlipidemia 12/03/2013  . HTN (hypertension) 12/03/2013  . GERD (gastroesophageal reflux disease) 03/24/2013  . Obstructive sleep apnea, ruled out 02/28/2013  . 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 Urticaria due to food allergy 08/31/2012  . Thrombocytopenia ([place])     Past Medical History:  Diagnosis Date  . Anemia   . Anxiety   . Atherosclerosis   . Back pain   . Coronary artery disease   . GERD (gastroesophageal reflux disease)   . HTN (hypertension) 12/03/2013   CT scan-no pheochromocytoma  . Hyperlipidemia   . Hypertension   . Hypertensive heart disease without heart failure   . Thrombocytopenia ([place])   . Vitamin D deficiency     Family History  Problem Relation Age of Onset  . Cancer Father    Past Surgical History:  Procedure Laterality Date  . SHOULDER SURGERY Bilateral   . TONSILLECTOMY     Social History   Social History Narrative  . No narrative on file     Objective: Vital Signs: BP (!) 145/66 (BP Location: Left Arm, Patient Position: Sitting, Cuff Size: Normal)   Pulse (!) 49   Resp 14   Ht 5' 4.25" (1.632 m)   Wt 127 lb (57.6 kg)   BMI 21.63 kg/m    Physical Exam  Constitutional: She is oriented to person, place, and time. She appears well-developed and well-nourished.  HENT:  Head: Normocephalic and atraumatic.  Eyes: Conjunctivae  and EOM are normal.  Neck: Normal range of motion.  Cardiovascular: Normal rate, regular rhythm, normal heart sounds and intact distal pulses.   Pulmonary/Chest: Effort normal and breath sounds normal.  Abdominal: Soft. Bowel sounds are normal.  Lymphadenopathy:    She has no cervical adenopathy.  Neurological: She is alert and oriented to person, place, and time.  Skin: Skin is warm and dry.  Capillary refill takes less than 2 seconds.  Psychiatric: She has a normal mood and affect. Her behavior is normal.  Nursing note and vitals reviewed.    Musculoskeletal Exam: C-spine and thoracic lumbar spine good range of motion. She is some discomfort range of motion of her lumbar spine. Shoulder joints although joints wrist joints with good range of motion. She has thickening of bilateral PIP/DIP CMC joints consistent with osteoarthritis with no synovitis. Hip joints knee joints ankles were good range of motion. She has thickening of PIP/DIP joints in her feet consistent with osteoarthritis.  CDAI Exam: No CDAI exam completed.    Investigation: No additional findings.   Imaging: Xr Foot 2 Views Left  Result Date: 04/03/2017 Right first MTP and all PIP DIP minimal narrowing. No erosive changes were noted. Impression these findings are consistent with mild osteoarthritis of the foot  Xr Foot 2 Views Right  Result Date: 04/03/2017 Right first MTP and all PIP DIP minimal narrowing. No erosive changes were noted. Impression these findings are consistent with mild osteoarthritis of the foot  Xr Hand 2 View Left  Result Date: 04/03/2017 Minimal PIP and CMC narrowing. No MCP or intercarpal joint space narrowing was noted. No erosive changes were noted. Impression: These findings are consistent with mild osteoarthritis  Xr Hand 2 View Right  Result Date: 04/03/2017 Minimal PIP and CMC narrowing. No MCP or intercarpal joint space narrowing was noted. No erosive changes were noted. Impression: These findings are consistent with mild osteoarthritis   Speciality Comments: No specialty comments available.  04/03/2017 CMP normal, UA negative, SPEP normal, ENA negative, C3-C4 normal, CK 120 Every 7 2018 ANA 1:160 homogeneous, ESR 11  Procedures:  No procedures performed Allergies: Acetaminophen; Beef-derived products; Codeine; Epinephrine; Latex; Morphine and related; Penicillins; Sertraline;  Solu-medrol [methylprednisolone]; Barbiturates; Bee pollen; Bee venom; Diovan [valsartan]; Gabapentin; Ivp dye [iodinated diagnostic agents]; Livalo [pitavastatin]; Nutritional supplements; Other; Oxycodone; Promethazine-dm; Shellfish allergy; Tape; Celebrex [celecoxib]; Lanolin; Mobic [meloxicam]; Nitrates, organic; Petroleum jelly [petrolatum]; Soy allergy; Strawberry extract; Sulfa antibiotics; and Tylox [oxycodone-acetaminophen]   Assessment / Plan:     Visit Diagnoses: Positive ANA (antinuclear antibody) - ENA negative, C3-C4 normal, no clinical features of autoimmune disease. We had detailed discussion that she had no evidence of some synovitis and all autoimmune workup is negative. I don't think at this point she has an autoimmune disease.  DDD lumbar spine: She has chronic pain, core muscle strengthening exercises were demonstrated and discussed.  Primary osteoarthritis of both hands - Mild, joint protection and muscle strengthening was discussed and handout on exercises was given.  Primary osteoarthritis of both feet - Mild, proper fitting shoes with more hormone was discussed. She is some overcrowding of her toes.  Peripheral neuropathy - Related to diabetes  Essential hypertension  History of diabetes mellitus  Transient retinal artery occlusion of both eyes  Familial hyperlipidemia  Gastroesophageal reflux disease without esophagitis  Obstructive sleep apnea, ruled out    Orders: No orders of the defined types were placed in this encounter.  No orders of the defined types were placed in this encounter.  Face-to-face time spent with patient was 30 minutes. 50% of time was spent in counseling and coordination of care.  Follow-Up Instructions: Return if symptoms worsen or fail to improve, for Osteoarthritis.   Bo Merino, MD  Note - This record has been created using Editor, commissioning.  Chart creation errors have been sought, but may not always  have been  located. Such creation errors do not reflect on  the standard of medical care.

## 2017-04-29 DIAGNOSIS — E785 Hyperlipidemia, unspecified: Secondary | ICD-10-CM | POA: Diagnosis not present

## 2017-04-29 DIAGNOSIS — K219 Gastro-esophageal reflux disease without esophagitis: Secondary | ICD-10-CM | POA: Diagnosis not present

## 2017-04-29 DIAGNOSIS — I119 Hypertensive heart disease without heart failure: Secondary | ICD-10-CM | POA: Diagnosis not present

## 2017-04-29 DIAGNOSIS — R002 Palpitations: Secondary | ICD-10-CM | POA: Diagnosis not present

## 2017-04-29 DIAGNOSIS — R0789 Other chest pain: Secondary | ICD-10-CM | POA: Diagnosis not present

## 2017-04-29 DIAGNOSIS — I7090 Unspecified atherosclerosis: Secondary | ICD-10-CM | POA: Diagnosis not present

## 2017-05-02 ENCOUNTER — Ambulatory Visit (INDEPENDENT_AMBULATORY_CARE_PROVIDER_SITE_OTHER): Payer: Medicare HMO | Admitting: Rheumatology

## 2017-05-02 ENCOUNTER — Encounter: Payer: Self-pay | Admitting: Rheumatology

## 2017-05-02 VITALS — BP 145/66 | HR 49 | Resp 14 | Ht 64.25 in | Wt 127.0 lb

## 2017-05-02 DIAGNOSIS — H3403 Transient retinal artery occlusion, bilateral: Secondary | ICD-10-CM | POA: Diagnosis not present

## 2017-05-02 DIAGNOSIS — K219 Gastro-esophageal reflux disease without esophagitis: Secondary | ICD-10-CM

## 2017-05-02 DIAGNOSIS — M19041 Primary osteoarthritis, right hand: Secondary | ICD-10-CM | POA: Diagnosis not present

## 2017-05-02 DIAGNOSIS — I10 Essential (primary) hypertension: Secondary | ICD-10-CM | POA: Diagnosis not present

## 2017-05-02 DIAGNOSIS — Z8639 Personal history of other endocrine, nutritional and metabolic disease: Secondary | ICD-10-CM | POA: Diagnosis not present

## 2017-05-02 DIAGNOSIS — G609 Hereditary and idiopathic neuropathy, unspecified: Secondary | ICD-10-CM | POA: Diagnosis not present

## 2017-05-02 DIAGNOSIS — M19071 Primary osteoarthritis, right ankle and foot: Secondary | ICD-10-CM

## 2017-05-02 DIAGNOSIS — R768 Other specified abnormal immunological findings in serum: Secondary | ICD-10-CM | POA: Diagnosis not present

## 2017-05-02 DIAGNOSIS — E7849 Other hyperlipidemia: Secondary | ICD-10-CM

## 2017-05-02 DIAGNOSIS — E784 Other hyperlipidemia: Secondary | ICD-10-CM | POA: Diagnosis not present

## 2017-05-02 DIAGNOSIS — M19072 Primary osteoarthritis, left ankle and foot: Secondary | ICD-10-CM

## 2017-05-02 DIAGNOSIS — M19042 Primary osteoarthritis, left hand: Secondary | ICD-10-CM | POA: Diagnosis not present

## 2017-05-02 DIAGNOSIS — M47896 Other spondylosis, lumbar region: Secondary | ICD-10-CM | POA: Diagnosis not present

## 2017-05-02 DIAGNOSIS — G4733 Obstructive sleep apnea (adult) (pediatric): Secondary | ICD-10-CM | POA: Diagnosis not present

## 2017-05-02 NOTE — Patient Instructions (Addendum)
Back Exercises The following exercises strengthen the muscles that help to support the back. They also help to keep the lower back flexible. Doing these exercises can help to prevent back pain or lessen existing pain. If you have back pain or discomfort, try doing these exercises 2-3 times each day or as told by your health care provider. When the pain goes away, do them once each day, but increase the number of times that you repeat the steps for each exercise (do more repetitions). If you do not have back pain or discomfort, do these exercises once each day or as told by your health care provider. Exercises Single Knee to Chest   Repeat these steps 3-5 times for each leg: 1. Lie on your back on a firm bed or the floor with your legs extended. 2. Bring one knee to your chest. Your other leg should stay extended and in contact with the floor. 3. Hold your knee in place by grabbing your knee or thigh. 4. Pull on your knee until you feel a gentle stretch in your lower back. 5. Hold the stretch for 10-30 seconds. 6. Slowly release and straighten your leg. Pelvic Tilt   Repeat these steps 5-10 times: 1. Lie on your back on a firm bed or the floor with your legs extended. 2. Bend your knees so they are pointing toward the ceiling and your feet are flat on the floor. 3. Tighten your lower abdominal muscles to press your lower back against the floor. This motion will tilt your pelvis so your tailbone points up toward the ceiling instead of pointing to your feet or the floor. 4. With gentle tension and even breathing, hold this position for 5-10 seconds. Cat-Cow   Repeat these steps until your lower back becomes more flexible: 1. Get into a hands-and-knees position on a firm surface. Keep your hands under your shoulders, and keep your knees under your hips. You may place padding under your knees for comfort. 2. Let your head hang down, and point your tailbone toward the floor so your lower back  becomes rounded like the back of a cat. 3. Hold this position for 5 seconds. 4. Slowly lift your head and point your tailbone up toward the ceiling so your back forms a sagging arch like the back of a cow. 5. Hold this position for 5 seconds. Press-Ups   Repeat these steps 5-10 times: 1. Lie on your abdomen (face-down) on the floor. 2. Place your palms near your head, about shoulder-width apart. 3. While you keep your back as relaxed as possible and keep your hips on the floor, slowly straighten your arms to raise the top half of your body and lift your shoulders. Do not use your back muscles to raise your upper torso. You may adjust the placement of your hands to make yourself more comfortable. 4. Hold this position for 5 seconds while you keep your back relaxed. 5. Slowly return to lying flat on the floor. Bridges   Repeat these steps 10 times: 1. Lie on your back on a firm surface. 2. Bend your knees so they are pointing toward the ceiling and your feet are flat on the floor. 3. Tighten your buttocks muscles and lift your buttocks off of the floor until your waist is at almost the same height as your knees. You should feel the muscles working in your buttocks and the back of your thighs. If you do not feel these muscles, slide your feet 1-2 inches farther  away from your buttocks. 4. Hold this position for 3-5 seconds. 5. Slowly lower your hips to the starting position, and allow your buttocks muscles to relax completely. If this exercise is too easy, try doing it with your arms crossed over your chest. Abdominal Crunches   Repeat these steps 5-10 times: 1. Lie on your back on a firm bed or the floor with your legs extended. 2. Bend your knees so they are pointing toward the ceiling and your feet are flat on the floor. 3. Cross your arms over your chest. 4. Tip your chin slightly toward your chest without bending your neck. 5. Tighten your abdominal muscles and slowly raise your trunk  (torso) high enough to lift your shoulder blades a tiny bit off of the floor. Avoid raising your torso higher than that, because it can put too much stress on your low back and it does not help to strengthen your abdominal muscles. 6. Slowly return to your starting position. Back Lifts  Repeat these steps 5-10 times: 1. Lie on your abdomen (face-down) with your arms at your sides, and rest your forehead on the floor. 2. Tighten the muscles in your legs and your buttocks. 3. Slowly lift your chest off of the floor while you keep your hips pressed to the floor. Keep the back of your head in line with the curve in your back. Your eyes should be looking at the floor. 4. Hold this position for 3-5 seconds. 5. Slowly return to your starting position. Contact a health care provider if:  Your back pain or discomfort gets much worse when you do an exercise.  Your back pain or discomfort does not lessen within 2 hours after you exercise. If you have any of these problems, stop doing these exercises right away. Do not do them again unless your health care provider says that you can. Get help right away if:  You develop sudden, severe back pain. If this happens, stop doing the exercises right away. Do not do them again unless your health care provider says that you can. This information is not intended to replace advice given to you by your health care provider. Make sure you discuss any questions you have with your health care provider. Document Released: 01/23/2005 Document Revised: 04/24/2016 Document Reviewed: 02/09/2015 Elsevier Interactive Patient Education  2017 [employer] Hand Exercises Hand exercises can be helpful to almost anyone. These exercises can strengthen the hands, improve flexibility and movement, and increase blood flow to the hands. These results can make work and daily tasks easier. Hand exercises can be especially helpful for people who have joint pain from arthritis or have  nerve damage from overuse (carpal tunnel syndrome). These exercises can also help people who have injured a hand. Most of these hand exercises are fairly gentle stretching routines. You can do them often throughout the day. Still, it is a good idea to ask your health care provider which exercises would be best for you. Warming your hands before exercise may help to reduce stiffness. You can do this with gentle massage or by placing your hands in warm water for 15 minutes. Also, make sure you pay attention to your level of hand pain as you begin an exercise routine. Exercises Knuckle Bend  Repeat this exercise 5-10 times with each hand. 7. Stand or sit with your arm, hand, and all five fingers pointed straight up. Make sure your wrist is straight. 8. Gently and slowly bend your fingers down and inward until  the tips of your fingers are touching the tops of your palm. 9. Hold this position for a few seconds. 10. Extend your fingers out to their original position, all pointing straight up again. Finger Fan  Repeat this exercise 5-10 times with each hand. 5. Hold your arm and hand out in front of you. Keep your wrist straight. 6. Squeeze your hand into a fist. 7. Hold this position for a few seconds. 8. Edison Simon out, or spread apart, your hand and fingers as much as possible, stretching every joint fully. Tabletop  Repeat this exercise 5-10 times with each hand. 6. Stand or sit with your arm, hand, and all five fingers pointed straight up. Make sure your wrist is straight. 7. Gently and slowly bend your fingers at the knuckles where they meet the hand until your hand is making an upside-down L shape. Your fingers should form a tabletop. 8. Hold this position for a few seconds. 9. Extend your fingers out to their original position, all pointing straight up again. Making Os  Repeat this exercise 5-10 times with each hand. 6. Stand or sit with your arm, hand, and all five fingers pointed straight up. Make  sure your wrist is straight. 7. Make an O shape by touching your pointer finger to your thumb. Hold for a few seconds. Then open your hand wide. 8. Repeat this motion with each finger on your hand. Table Spread  Repeat this exercise 5-10 times with each hand. 6. Place your hand on a table with your palm facing down. Make sure your wrist is straight. 7. Spread your fingers out as much as possible. Hold this position for a few seconds. 8. Slide your fingers back together again. Hold for a few seconds. Ball Grip   Repeat this exercise 10-15 times with each hand. 7. Hold a tennis ball or another soft ball in your hand. 8. While slowly increasing pressure, squeeze the ball as hard as possible. 9. Squeeze as hard as you can for 3-5 seconds. 10. Relax and repeat. Wrist Curls  Repeat this exercise 10-15 times with each hand. 6. Sit in a chair that has armrests. 7. Hold a light weight in your hand, such as a dumbbell that weighs 1-3 pounds (0.5-1.4 kg). Ask your health care provider what weight would be best for you. 8. Rest your hand just over the end of the chair arm with your palm facing up. 9. Gently pivot your wrist up and down while holding the weight. Do not twist your wrist from side to side. Contact a health care provider if:  Your hand pain or discomfort gets much worse when you do an exercise.  Your hand pain or discomfort does not improve within 2 hours after you exercise. If you have any of these problems, stop doing these exercises right away. Do not do them again unless your health care provider says that you can. Get help right away if:  You develop sudden, severe hand pain. If this happens, stop doing these exercises right away. Do not do them again unless your health care provider says that you can. This information is not intended to replace advice given to you by your health care provider. Make sure you discuss any questions you have with your health care provider. Document  Released: 11/27/2015 Document Revised: 05/23/2016 Document Reviewed: 06/26/2015 Elsevier Interactive Patient Education  2017 [employer].

## 2017-06-13 DIAGNOSIS — E785 Hyperlipidemia, unspecified: Secondary | ICD-10-CM | POA: Diagnosis not present

## 2017-06-13 DIAGNOSIS — E119 Type 2 diabetes mellitus without complications: Secondary | ICD-10-CM | POA: Diagnosis not present

## 2017-06-13 DIAGNOSIS — M2041 Other hammer toe(s) (acquired), right foot: Secondary | ICD-10-CM | POA: Diagnosis not present

## 2017-06-13 DIAGNOSIS — E559 Vitamin D deficiency, unspecified: Secondary | ICD-10-CM | POA: Diagnosis not present

## 2017-06-13 DIAGNOSIS — D696 Thrombocytopenia, unspecified: Secondary | ICD-10-CM | POA: Diagnosis not present

## 2017-06-13 DIAGNOSIS — J3489 Other specified disorders of nose and nasal sinuses: Secondary | ICD-10-CM | POA: Diagnosis not present

## 2017-06-13 DIAGNOSIS — I10 Essential (primary) hypertension: Secondary | ICD-10-CM | POA: Diagnosis not present

## 2017-06-13 DIAGNOSIS — M2042 Other hammer toe(s) (acquired), left foot: Secondary | ICD-10-CM | POA: Diagnosis not present

## 2017-08-21 DIAGNOSIS — H04123 Dry eye syndrome of bilateral lacrimal glands: Secondary | ICD-10-CM | POA: Diagnosis not present

## 2017-08-21 DIAGNOSIS — H25811 Combined forms of age-related cataract, right eye: Secondary | ICD-10-CM | POA: Diagnosis not present

## 2017-08-21 DIAGNOSIS — E119 Type 2 diabetes mellitus without complications: Secondary | ICD-10-CM | POA: Diagnosis not present

## 2017-08-21 DIAGNOSIS — H25812 Combined forms of age-related cataract, left eye: Secondary | ICD-10-CM | POA: Diagnosis not present

## 2017-08-21 DIAGNOSIS — H02831 Dermatochalasis of right upper eyelid: Secondary | ICD-10-CM | POA: Diagnosis not present

## 2017-08-21 DIAGNOSIS — H02834 Dermatochalasis of left upper eyelid: Secondary | ICD-10-CM | POA: Diagnosis not present

## 2017-08-21 DIAGNOSIS — H25813 Combined forms of age-related cataract, bilateral: Secondary | ICD-10-CM | POA: Diagnosis not present

## 2017-11-04 DIAGNOSIS — I7090 Unspecified atherosclerosis: Secondary | ICD-10-CM | POA: Diagnosis not present

## 2017-11-04 DIAGNOSIS — I119 Hypertensive heart disease without heart failure: Secondary | ICD-10-CM | POA: Diagnosis not present

## 2017-11-04 DIAGNOSIS — E7849 Other hyperlipidemia: Secondary | ICD-10-CM | POA: Diagnosis not present

## 2017-11-04 DIAGNOSIS — K219 Gastro-esophageal reflux disease without esophagitis: Secondary | ICD-10-CM | POA: Diagnosis not present

## 2017-11-10 DIAGNOSIS — H11123 Conjunctival concretions, bilateral: Secondary | ICD-10-CM | POA: Diagnosis not present

## 2017-11-10 DIAGNOSIS — H25813 Combined forms of age-related cataract, bilateral: Secondary | ICD-10-CM | POA: Diagnosis not present

## 2017-11-17 DIAGNOSIS — H25812 Combined forms of age-related cataract, left eye: Secondary | ICD-10-CM | POA: Diagnosis not present

## 2017-11-17 DIAGNOSIS — H2512 Age-related nuclear cataract, left eye: Secondary | ICD-10-CM | POA: Diagnosis not present

## 2018-01-05 DIAGNOSIS — Z961 Presence of intraocular lens: Secondary | ICD-10-CM | POA: Diagnosis not present

## 2018-01-06 ENCOUNTER — Emergency Department (HOSPITAL_COMMUNITY)
Admission: EM | Admit: 2018-01-06 | Discharge: 2018-01-06 | Disposition: A | Discharge status: Home / Self Care | Payer: Medicare HMO | Attending: Emergency Medicine | Admitting: Emergency Medicine

## 2018-01-06 ENCOUNTER — Emergency Department (HOSPITAL_COMMUNITY): Payer: Medicare HMO

## 2018-01-06 ENCOUNTER — Encounter (HOSPITAL_COMMUNITY): Payer: Self-pay | Admitting: Emergency Medicine

## 2018-01-06 DIAGNOSIS — Z7982 Long term (current) use of aspirin: Secondary | ICD-10-CM | POA: Diagnosis not present

## 2018-01-06 DIAGNOSIS — I10 Essential (primary) hypertension: Secondary | ICD-10-CM | POA: Diagnosis not present

## 2018-01-06 DIAGNOSIS — Z79899 Other long term (current) drug therapy: Secondary | ICD-10-CM | POA: Insufficient documentation

## 2018-01-06 DIAGNOSIS — R079 Chest pain, unspecified: Secondary | ICD-10-CM | POA: Diagnosis not present

## 2018-01-06 DIAGNOSIS — I119 Hypertensive heart disease without heart failure: Secondary | ICD-10-CM | POA: Diagnosis not present

## 2018-01-06 DIAGNOSIS — Z91040 Latex allergy status: Secondary | ICD-10-CM | POA: Insufficient documentation

## 2018-01-06 DIAGNOSIS — R030 Elevated blood-pressure reading, without diagnosis of hypertension: Secondary | ICD-10-CM | POA: Diagnosis present

## 2018-01-06 LAB — CBC WITH DIFFERENTIAL/PLATELET
Basophils Absolute: 0 10*3/uL (ref 0.0–0.1)
Basophils Relative: 0 %
EOS PCT: 1 %
Eosinophils Absolute: 0 10*3/uL (ref 0.0–0.7)
HEMATOCRIT: 41 % (ref 36.0–46.0)
HEMOGLOBIN: 12.9 g/dL (ref 12.0–15.0)
LYMPHS ABS: 1.1 10*3/uL (ref 0.7–4.0)
LYMPHS PCT: 26 %
MCH: 22.8 pg — AB (ref 26.0–34.0)
MCHC: 31.5 g/dL (ref 30.0–36.0)
MCV: 72.4 fL — ABNORMAL LOW (ref 78.0–100.0)
MONOS PCT: 9 %
Monocytes Absolute: 0.4 10*3/uL (ref 0.1–1.0)
NEUTROS ABS: 2.9 10*3/uL (ref 1.7–7.7)
Neutrophils Relative %: 64 %
Platelets: 142 10*3/uL — ABNORMAL LOW (ref 150–400)
RBC: 5.66 MIL/uL — ABNORMAL HIGH (ref 3.87–5.11)
RDW: 15 % (ref 11.5–15.5)
WBC: 4.4 10*3/uL (ref 4.0–10.5)

## 2018-01-06 LAB — BASIC METABOLIC PANEL
Anion gap: 8 (ref 5–15)
BUN: 12 mg/dL (ref 6–20)
CALCIUM: 9.3 mg/dL (ref 8.9–10.3)
CHLORIDE: 108 mmol/L (ref 101–111)
CO2: 24 mmol/L (ref 22–32)
CREATININE: 0.74 mg/dL (ref 0.44–1.00)
GFR calc Af Amer: >60 mL/min (ref >60)
GFR calc non Af Amer: >60 mL/min (ref >60)
GLUCOSE: 108 mg/dL — AB (ref 65–99)
Potassium: 4.2 mmol/L (ref 3.5–5.1)
Sodium: 140 mmol/L (ref 135–145)

## 2018-01-06 LAB — I-STAT TROPONIN, ED: Troponin i, poc: 0.01 ng/mL (ref 0.00–0.08)

## 2018-01-06 MED ORDER — AMLODIPINE BESYLATE 5 MG PO TABS
5.0000 mg | ORAL_TABLET | Freq: Once | ORAL | Status: AC
  Administered 2018-01-06: 5 mg via ORAL
  Filled 2018-01-06: qty 1

## 2018-01-06 MED ORDER — AMLODIPINE BESYLATE 2.5 MG PO TABS: 7.5000 mg | ORAL_TABLET | Freq: Every day | ORAL | 0 refills | Status: DC

## 2018-01-06 NOTE — Discharge Instructions (Signed)
Start taking 7.5 mg of your blood pressure medication daily, follow-up with your doctor to recheck on your blood pressure next week.  Continue with the recommendations by your eye doctor

## 2018-01-06 NOTE — ED Provider Notes (Signed)
[facility] DEPT Provider Note   CSN: [id number] Arrival date & time: 01/06/18  0757     History   Chief Complaint Chief Complaint  Patient presents with  . Hypertension    HPI Donna Baldwin is a 77 y.o. female.  HPI The patient presents to the emergency room for evaluation of hypertension.  Patient states her blood pressure was normally well controlled.  She has been having issues ever since having eye surgery in November.  Patient states after her surgery she was started on steroid eyedrops.  She felt like her eyedrops were contributing to her high blood pressure because she noted that her blood pressures were becoming very elevated.  She decided to taper off on her steroid drops on her own.  She went back to her eye doctor who told her that it was very important for her to not steroid eyedrops.  They recommended that she follow-up with her primary care doctor regarding her blood pressure.  Patient states yesterday her blood pressure was high again and so she decided not to take any of her eyedrops today.  She also had an episode of feeling lightheaded and some brief chest discomfort.  She is concerned about the steroid effect on her blood pressure.  She denies any difficulty with chest pain or shortness of breath today.  No headache, numbness or weakness. Past Medical History:  Diagnosis Date  . Anemia   . Anxiety   . Atherosclerosis   . Back pain   . Coronary artery disease   . GERD (gastroesophageal reflux disease)   . HTN (hypertension) 12/03/2013   CT scan-no pheochromocytoma  . Hyperlipidemia   . Hypertension   . Hypertensive heart disease without heart failure   . Thrombocytopenia ([place])   . Vitamin D deficiency     Patient Active Problem List   Diagnosis Date Noted  . History of diabetes mellitus 04/01/2017  . 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 Hereditary and idiopathic peripheral neuropathy 07/07/2014    . Aortic atherosclerosis ([place]) 12/03/2013  . Familial hyperlipidemia 12/03/2013  . HTN (hypertension) 12/03/2013  . GERD (gastroesophageal reflux disease) 03/24/2013  . Obstructive sleep apnea, ruled out 02/28/2013  . 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 Urticaria due to food allergy 08/31/2012  . Thrombocytopenia ([place])     Past Surgical History:  Procedure Laterality Date  . EYE SURGERY    . SHOULDER SURGERY Bilateral   . TONSILLECTOMY      OB History    No data available       Home Medications    Prior to Admission medications   Medication Sig Start Date End Date Taking? Authorizing Provider  aspirin 81 MG tablet Take 81 mg by mouth daily as needed for pain.    Yes [provider]  cetirizine (ZYRTEC) 10 MG tablet Take 10 mg by mouth daily.   Yes [provider]  docusate sodium (COLACE) 100 MG capsule Take 100 mg by mouth daily as needed for mild constipation.   Yes [provider]  EPINEPHrine 0.3 mg/0.3 mL IJ SOAJ injection Inject 0.3 mg into the muscle once. 05/31/16  Yes [provider]  ketorolac (ACULAR) 0.5 % ophthalmic solution INSTILL 1 DROP INTO LEFT EYE FOUR TIMES A DAY STARTING 1 DAY BEFORE SURGERY 10/28/17  Yes [provider]  Magnesium Hydroxide (MILK OF MAGNESIA PO) Take 15 mLs by mouth daily as needed (CONSTIPATION).   Yes [provider]  metoprolol succinate (TOPROL-XL) 50 MG 24 hr tablet Take 50 mg by mouth daily.  08/04/15  Yes [provider]  Polyvinyl Alcohol-Povidone (REFRESH OP) Place 1 drop into the right eye daily as needed (DRY EYE).   Yes [provider]  prednisoLONE acetate (PRED FORTE) 1 % ophthalmic suspension INSTILL 1 DROP INTO LEFT EYE FOUR TIMES A DAY AFTER SURGERY 11/20/17  Yes [provider]  albuterol (PROVENTIL HFA;VENTOLIN HFA) 108 (90 Base) MCG/ACT inhaler Inhale 2 puffs into the lungs every 4 (four) hours as needed for wheezing or shortness of  breath. Patient not taking: Reported on 05/02/2017 03/31/16   Konrad Felix, PA  amLODipine (NORVASC) 2.5 MG tablet Take 3 tablets (7.5 mg total) by mouth daily. 01/06/18 02/05/18  Dorie Rank, MD  famotidine (PEPCID) 20 MG tablet Take 1 tablet (20 mg total) by mouth 2 (two) times daily. Patient not taking: Reported on 01/06/2018 07/16/16   Dorie Rank, MD  LORazepam (ATIVAN) 1 MG tablet Take 0.5 tablets (0.5 mg total) by mouth every 8 (eight) hours as needed for anxiety. Patient not taking: Reported on 01/06/2018 07/29/15   Jola Schmidt, MD    Family History Family History  Problem Relation Age of Onset  . Cancer Father     Social History Social History   Tobacco Use  . Smoking status: Never Smoker  . Smokeless tobacco: Never Used  Substance Use Topics  . Alcohol use: No    Alcohol/week: 0.0 oz  . Drug use: No     Allergies   Acetaminophen; Beef-derived products; Codeine; Epinephrine; Latex; Morphine and related; Penicillins; Sertraline; Solu-medrol [methylprednisolone]; Barbiturates; Bee pollen; Bee venom; Diovan [valsartan]; Gabapentin; Ivp dye [iodinated diagnostic agents]; Livalo [pitavastatin]; Nutritional supplements; Other; Oxycodone; Promethazine-dm; Shellfish allergy; Tape; Celebrex [celecoxib]; Lanolin; Mobic [meloxicam]; Nitrates, organic; Petroleum jelly [petrolatum]; Soy allergy; Strawberry extract; Sulfa antibiotics; and Tylox [oxycodone-acetaminophen]   Review of Systems Review of Systems  All other systems reviewed and are negative.    Physical Exam Updated Vital Signs BP 135/71   Pulse 86   Temp 98.1 F (36.7 C) (Oral)   Resp 17   SpO2 100%   Physical Exam  Constitutional: She appears well-developed and well-nourished. No distress.  HENT:  Head: Normocephalic and atraumatic.  Right Ear: External ear normal.  Left Ear: External ear normal.  Eyes: Conjunctivae are normal. Right eye exhibits no discharge. Left eye exhibits no discharge. No scleral icterus.   Neck: Neck supple. No tracheal deviation present.  Cardiovascular: Normal rate, regular rhythm and intact distal pulses.  Pulmonary/Chest: Effort normal and breath sounds normal. No stridor. No respiratory distress. She has no wheezes. She has no rales.  Abdominal: Soft. Bowel sounds are normal. She exhibits no distension. There is no tenderness. There is no rebound and no guarding.  Musculoskeletal: She exhibits no edema or tenderness.  Neurological: She is alert. She has normal strength. No cranial nerve deficit (no facial droop, extraocular movements intact, no slurred speech) or sensory deficit. She exhibits normal muscle tone. She displays no seizure activity. Coordination normal.  Skin: Skin is warm and dry. No rash noted.  Psychiatric: She has a normal mood and affect.  Nursing note and vitals reviewed.    ED Treatments / Results  Labs (all labs ordered are listed, but only abnormal results are displayed) Labs Reviewed  CBC WITH DIFFERENTIAL/PLATELET - Abnormal; Notable for the following components:      Result Value   RBC 5.66 (*)    MCV 72.4 (*)  MCH 22.8 (*)    Platelets 142 (*)    All other components within normal limits  BASIC METABOLIC PANEL - Abnormal; Notable for the following components:   Glucose, Bld 108 (*)    All other components within normal limits  I-STAT TROPONIN, ED    EKG  EKG Interpretation  Date/Time:  Tuesday January 06 2018 09:33:35 EST Ventricular Rate:  45 PR Interval:    QRS Duration: 87 QT Interval:  475 QTC Calculation: 411 R Axis:   -3 Text Interpretation:  Sinus bradycardia Abnormal R-wave progression, early transition No significant change since last tracing Confirmed by Dorie Rank [phone]) on 01/06/2018 9:38:53 AM       Radiology Dg Chest 2 View  Result Date: 01/06/2018 CLINICAL DATA:  C/o elevated BP, headache, chest pain, some sob, left arm swelling. EXAM: CHEST  2 VIEW COMPARISON:  09/24/2016 FINDINGS: Midline trachea. Normal  heart size and mediastinal contours. Atherosclerosis in the transverse aorta. No pleural effusion or pneumothorax. Clear lungs. IMPRESSION: No acute cardiopulmonary disease. Aortic Atherosclerosis (ICD10-I70.0). Electronically Signed   By: Abigail Miyamoto M.D.   On: 01/06/2018 09:51    Procedures Procedures (including critical care time)  Medications Ordered in ED Medications  amLODipine (NORVASC) tablet 5 mg (5 mg Oral Given 01/06/18 0947)     Initial Impression / Assessment and Plan / ED Course  I have reviewed the triage vital signs and the nursing notes.  Pertinent labs & imaging results that were available during my care of the patient were reviewed by me and considered in my medical decision making (see chart for details).   Patient presented to the emergency room for evaluation of hypertension.  Patient was concerned about the side effects from her steroid eyedrops.  I explained to her that if her eye doctor said it was very important for her to continue those that I would recommend she continue doing that.  We can certainly adjust her blood pressure medications in the meantime.  Patient was given additional dose of Norvasc with improvement of her blood pressure.  Plan on discharge home of increasing her blood pressure from 5 mg daily to 7.5 mg daily  Final Clinical Impressions(s) / ED Diagnoses   Final diagnoses:  Essential hypertension    ED Discharge Orders        Ordered    amLODipine (NORVASC) 7.5 MG tablet  Daily     01/06/18 1311       Dorie Rank, MD 01/06/18 1314

## 2018-01-06 NOTE — ED Triage Notes (Signed)
Patient reports that she had eye surgery in November and on steroid eye drops but in last week her BP has been running high. Went yesterday and told that BP been high so dropped number of drops down yesterday.

## 2018-01-06 NOTE — ED Notes (Signed)
ED Provider at bedside. 

## 2018-01-06 NOTE — ED Notes (Signed)
Bed: WA03 Expected date:  Expected time:  Means of arrival:  Comments: 

## 2018-01-09 ENCOUNTER — Encounter (HOSPITAL_COMMUNITY): Payer: Self-pay | Admitting: Emergency Medicine

## 2018-01-09 ENCOUNTER — Emergency Department (HOSPITAL_COMMUNITY): Payer: Medicare HMO

## 2018-01-09 DIAGNOSIS — R51 Headache: Secondary | ICD-10-CM | POA: Diagnosis not present

## 2018-01-09 DIAGNOSIS — I2510 Atherosclerotic heart disease of native coronary artery without angina pectoris: Secondary | ICD-10-CM | POA: Diagnosis not present

## 2018-01-09 DIAGNOSIS — Z79899 Other long term (current) drug therapy: Secondary | ICD-10-CM | POA: Diagnosis not present

## 2018-01-09 DIAGNOSIS — F419 Anxiety disorder, unspecified: Secondary | ICD-10-CM | POA: Diagnosis not present

## 2018-01-09 DIAGNOSIS — I10 Essential (primary) hypertension: Secondary | ICD-10-CM | POA: Diagnosis not present

## 2018-01-09 DIAGNOSIS — R0789 Other chest pain: Secondary | ICD-10-CM | POA: Diagnosis not present

## 2018-01-09 DIAGNOSIS — R079 Chest pain, unspecified: Secondary | ICD-10-CM | POA: Diagnosis not present

## 2018-01-09 DIAGNOSIS — Z91040 Latex allergy status: Secondary | ICD-10-CM | POA: Diagnosis not present

## 2018-01-09 LAB — BASIC METABOLIC PANEL
ANION GAP: 11 (ref 5–15)
BUN: 15 mg/dL (ref 6–20)
CALCIUM: 9.6 mg/dL (ref 8.9–10.3)
CO2: 23 mmol/L (ref 22–32)
CREATININE: 0.85 mg/dL (ref 0.44–1.00)
Chloride: 105 mmol/L (ref 101–111)
Glucose, Bld: 103 mg/dL — ABNORMAL HIGH (ref 65–99)
Potassium: 3.7 mmol/L (ref 3.5–5.1)
Sodium: 139 mmol/L (ref 135–145)

## 2018-01-09 LAB — CBC
HCT: 40.2 % (ref 36.0–46.0)
HEMOGLOBIN: 12.7 g/dL (ref 12.0–15.0)
MCH: 23 pg — AB (ref 26.0–34.0)
MCHC: 31.6 g/dL (ref 30.0–36.0)
MCV: 72.7 fL — ABNORMAL LOW (ref 78.0–100.0)
PLATELETS: 137 10*3/uL — AB (ref 150–400)
RBC: 5.53 MIL/uL — AB (ref 3.87–5.11)
RDW: 14.6 % (ref 11.5–15.5)
WBC: 5.3 10*3/uL (ref 4.0–10.5)

## 2018-01-09 LAB — I-STAT TROPONIN, ED: TROPONIN I, POC: 0 ng/mL (ref 0.00–0.08)

## 2018-01-09 NOTE — ED Triage Notes (Signed)
Pt reports L sided CP onset 1/7, radiates into her jaw. Also reports weakness, nausea. Pt has sickly appearance. Pt was seen 1/8 at [facility] for HTN, pt remains hypertensive here today.

## 2018-01-10 ENCOUNTER — Emergency Department (HOSPITAL_COMMUNITY)
Admission: EM | Admit: 2018-01-10 | Discharge: 2018-01-10 | Discharge status: Against Medical Advice | Payer: Medicare HMO | Attending: Emergency Medicine | Admitting: Emergency Medicine

## 2018-01-10 ENCOUNTER — Emergency Department (HOSPITAL_COMMUNITY): Payer: Medicare HMO

## 2018-01-10 DIAGNOSIS — I10 Essential (primary) hypertension: Secondary | ICD-10-CM

## 2018-01-10 DIAGNOSIS — R0789 Other chest pain: Secondary | ICD-10-CM | POA: Diagnosis not present

## 2018-01-10 DIAGNOSIS — R51 Headache: Secondary | ICD-10-CM | POA: Diagnosis not present

## 2018-01-10 DIAGNOSIS — R079 Chest pain, unspecified: Secondary | ICD-10-CM | POA: Diagnosis not present

## 2018-01-10 LAB — I-STAT TROPONIN, ED: Troponin i, poc: 0 ng/mL (ref 0.00–0.08)

## 2018-01-10 NOTE — ED Provider Notes (Signed)
TIME SEEN: 12:48 AM  CHIEF COMPLAINT: Chest pain, headache, high blood pressure  HPI: Patient is a 77 year old female with history of hypertension, diabetes, hyperlipidemia, coronary artery disease who presents to the emergency department with complaints that her blood pressure has been fluctuating for the past couple of weeks.  Was seen in the emergency department on January 7.  States at that time she was taking amlodipine 2.5 mg twice daily and it was increased by the ED physician to 2.5 mg 3 times daily.  She is also been on metoprolol 50 mg extended release once daily.  States that she has had blood pressures at home in the 180s/90s.  She called her PCPs office today and they instructed she come to the emergency department.  She did have an episode of left-sided chest pain that she describes as a tightness this evening that radiated into her jaw.  Had nausea and shortness of breath.  No current chest pain now.  Has had intermittent headaches.  Has a chronic history of headaches but states these seem more severe.  No numbness, tingling or focal weakness.  She has not missed any doses of her blood pressure medication.  Denies any changes to her diet.  States all of the symptoms started after having eye surgery on December 19.  ROS: See HPI Constitutional: no fever  Eyes: no drainage  ENT: no runny nose   Cardiovascular:  no chest pain  Resp: no SOB  GI: no vomiting GU: no dysuria Integumentary: no rash  Allergy: no hives  Musculoskeletal: no leg swelling  Neurological: no slurred speech ROS otherwise negative  PAST MEDICAL HISTORY/PAST SURGICAL HISTORY:  Past Medical History:  Diagnosis Date  . Anemia   . Anxiety   . Atherosclerosis   . Back pain   . Coronary artery disease   . GERD (gastroesophageal reflux disease)   . HTN (hypertension) 12/03/2013   CT scan-no pheochromocytoma  . Hyperlipidemia   . Hypertension   . Hypertensive heart disease without heart failure   .  Thrombocytopenia ([place])   . Vitamin D deficiency     MEDICATIONS:  Prior to Admission medications   Medication Sig Start Date End Date Taking? Authorizing Provider  amLODipine (NORVASC) 2.5 MG tablet Take 3 tablets (7.5 mg total) by mouth daily. Patient taking differently: Take 2.5-5 mg by mouth See admin instructions. 2.5 mg by mouth in the morning and 5 mg at bedtime 01/06/18 02/05/18 Yes Dorie Rank, MD  aspirin 81 MG tablet Take 81 mg by mouth daily as needed (for pain and pressure).    Yes [provider]  Carboxymethylcellul-Glycerin (REFRESH OPTIVE) 1-0.9 % GEL Place 1 drop into the right eye daily as needed (for "dry eye").   Yes [provider]  cetirizine (ZYRTEC) 10 MG tablet Take 10 mg by mouth daily as needed for allergies.    Yes [provider]  docusate sodium (COLACE) 100 MG capsule Take 100 mg by mouth daily as needed for mild constipation.   Yes [provider]  EPINEPHrine 0.3 mg/0.3 mL IJ SOAJ injection Inject 0.3 mg into the muscle once as needed (for anaphylaxis).  05/31/16  Yes [provider]  LORazepam (ATIVAN) 1 MG tablet Take 0.5 tablets (0.5 mg total) by mouth every 8 (eight) hours as needed for anxiety. 07/29/15  Yes Jola Schmidt, MD  Magnesium Hydroxide (MILK OF MAGNESIA PO) Take 15 mLs by mouth daily as needed (CONSTIPATION).   Yes [provider]  metoprolol succinate (TOPROL-XL)  50 MG 24 hr tablet Take 50 mg by mouth daily.  08/04/15  Yes [provider]  NON FORMULARY [employer] Warm Vanilla Body Butter: Apply to dry skin daily   Yes [provider]  prednisoLONE acetate (PRED FORTE) 1 % ophthalmic suspension INSTILL 1 DROP INTO LEFT EYE FOUR TIMES A DAY AFTER SURGERY 11/20/17  Yes [provider]  albuterol (PROVENTIL HFA;VENTOLIN HFA) 108 (90 Base) MCG/ACT inhaler Inhale 2 puffs into the lungs every 4 (four) hours as needed for wheezing or shortness of breath. Patient not taking: Reported  on 01/09/2018 03/31/16   Konrad Felix, PA  famotidine (PEPCID) 20 MG tablet Take 1 tablet (20 mg total) by mouth 2 (two) times daily. Patient not taking: Reported on 01/09/2018 07/16/16   Dorie Rank, MD    ALLERGIES:  Allergies  Allergen Reactions  . Acetaminophen Other (See Comments)    Tremor, anxiety  . Beef-Derived Products Anaphylaxis  . Codeine Hives and Anxiety  . Epinephrine Palpitations  . Latex Rash  . Morphine And Related Other (See Comments)    Tremors, increased heart rate, excitement, confusion per pt  . Penicillins Hives    Has patient had a PCN reaction causing immediate rash, facial/tongue/throat swelling, SOB or lightheadedness with hypotension: Yes Has patient had a PCN reaction causing severe rash involving mucus membranes or skin necrosis: Yes Has patient had a PCN reaction that required hospitalization: Yes Has patient had a PCN reaction occurring within the last 10 years: No If all of the above answers are "NO", then may proceed with Cephalosporin use.   . Sertraline Other (See Comments)    Numbness in mouth and hyperactivity  . Solu-Medrol [Methylprednisolone] Other (See Comments)    Numbness and tingling in mouth  . Barbiturates Other (See Comments)    Excitement   . Bee Pollen Hives  . Bee Venom Hives  . Diovan [Valsartan] Nausea Only  . Gabapentin Other (See Comments)    Visual disturbance  . Ivp Dye [Iodinated Diagnostic Agents] Other (See Comments)    ANAPHYLACTIC  . Livalo [Pitavastatin] Swelling and Other (See Comments)    Facial swelling  . Nutritional Supplements Other (See Comments)    Nuts, strawberries, bicarbonate of soda,  . Other Palpitations and Other (See Comments)    NARCOTIC ANALGESICS-TREMORS, INCREASED HEART RATE Hmg-Coa Reductase Inhibitors - intolerance unknown   . Oxycodone Swelling, Rash and Cough  . Promethazine-Dm Other (See Comments)    "Felt funny"  . Shellfish Allergy Hives  . Tape Rash and Other (See Comments)     Red blotches  . Albuterol Other (See Comments)    Caused wheezing  . Pred Forte [Prednisolone Acetate] Other (See Comments)    Patient is seeing odd color and images while using this  . Celebrex [Celecoxib] Swelling and Other (See Comments)    Swelling and numbness (mouth)  . Lanolin Itching  . Mobic [Meloxicam] Swelling and Other (See Comments)    Swelling and numbness in mouth  . Nitrates, Organic Rash and Other (See Comments)    Chest tightness also  . Petroleum Jelly [Petrolatum] Rash  . Soy Allergy Rash  . Strawberry Extract Rash  . Sulfa Antibiotics Hives and Rash  . Tylox [Oxycodone-Acetaminophen] Swelling, Rash and Cough    SOCIAL HISTORY:  Social History   Tobacco Use  . Smoking status: Never Smoker  . Smokeless tobacco: Never Used  Substance Use Topics  . Alcohol use: No    Alcohol/week: 0.0 oz  FAMILY HISTORY: Family History  Problem Relation Age of Onset  . Cancer Father     EXAM: BP (!) 157/70 (BP Location: Right Arm)   Pulse (!) 46   Temp 98.1 F (36.7 C) (Oral)   Resp 16   Ht 5\' 5"  (1.651 m)   Wt 61.2 kg (135 lb)   SpO2 100%   BMI 22.47 kg/m  CONSTITUTIONAL: Alert and oriented and responds appropriately to questions. Well-appearing; well-nourished HEAD: Normocephalic EYES: Conjunctivae clear, pupils appear equal, EOMI ENT: normal nose; moist mucous membranes NECK: Supple, no meningismus, no nuchal rigidity, no LAD  CARD: RRR; S1 and S2 appreciated; no murmurs, no clicks, no rubs, no gallops RESP: Normal chest excursion without splinting or tachypnea; breath sounds clear and equal bilaterally; no wheezes, no rhonchi, no rales, no hypoxia or respiratory distress, speaking full sentences ABD/GI: Normal bowel sounds; non-distended; soft, non-tender, no rebound, no guarding, no peritoneal signs, no hepatosplenomegaly BACK:  The back appears normal and is non-tender to palpation, there is no CVA tenderness EXT: Normal ROM in all joints; non-tender to  palpation; no edema; normal capillary refill; no cyanosis, no calf tenderness or swelling    SKIN: Normal color for age and race; warm; no rash NEURO: Moves all extremities equally, sensation to light touch intact diffusely, cranial nerves II through XII intact, normal speech PSYCH: The patient's mood and manner are appropriate. Grooming and personal hygiene are appropriate.  MEDICAL DECISION MAKING: Patient here with complaints of chest pain today.  Has also had in her headaches without neurologic deficits and fluctuating blood pressure.  When I was in the room her blood pressure was 129/72 and then went back up to 167/70.  She does have multiple risk factors for ACS.  First troponin is negative.  Will obtain chest x-ray and obtain second troponin.  We will also obtain head CT.  I feel given her risk factors for ACS I have recommended admission for chest pain rule out.  I do not think that she needs further blood pressure medication at this time.  ED PROGRESS: Patient's head CT and chest x-ray showed no acute abnormalities.  Patient refuses any medication for her headache at this time.  She also declines aspirin stating that she was told she could not take it after her eye surgery.  Again given her risk factors for ACS and complaints of chest pain with concerning description I have recommended admission.  Will discuss with medicine.  Her PCP is Dr. Dema Severin.  She remains chest pain-free.  3:30 AM  Pt is upset that she has not gone to a bed upstairs yet.  She states she feels claustrophobic in the ED and that there is too much going on for her to rest.  We have explained to her that the hospital is full and have attempted to make her more comfortable several times.  Nurse in the room as well.  Patient states that she does not want to stay any longer.  Refuses further workup.  She will leave [place].  Have recommended she follow-up with her primary care physician closely.  Patient is oriented x3.   We discussed risks including worsening symptoms, severe and permanent disability, even death.  She is able to verbalize these risks back to me.  She appears to have capacity to make decisions for herself.  We have attempted to persuade her to stay several times and try to make her more comfortable.  Have also apologized.  Patient still states that  she would like discharge home.  Patient has decided to leave against medical advice without further workup.  We discussed the nature and purpose, risks and benefits, as well as, the alternatives of treatment. Time was given to allow the opportunity to ask questions and consider their options, and after the discussion, the patient decided to refuse further testing and/or offerred treatment. The patient was informed that refusal could lead to, but was not limited to, death, permanent disability, severe pain, worsening symptoms, disease progression. If present, I asked friends/family to dissuade them without success. Prior to refusing, I determined that the patient had the capacity to make their decision, does not appear clinically intoxicated and understood the consequences of that decision. Outpatient follow up has been encouraged and provided as needed.  Recommended patient return to the hospital if symptoms worsen, change or they would like further treatment and evaluation.  I reviewed all nursing notes, vitals, pertinent previous records, EKGs, lab and urine results, imaging (as available).   EKG Interpretation  Date/Time:  Friday January 09 2018 19:31:49 EST Ventricular Rate:  67 PR Interval:  146 QRS Duration: 78 QT Interval:  422 QTC Calculation: 445 R Axis:   -29 Text Interpretation:  Normal sinus rhythm Normal ECG No significant change since last tracing Confirmed by Ward, Cyril Mourning [phone]) on 01/10/2018 12:47:48 AM         Ward, Delice Bison, DO 01/10/18 1914

## 2018-01-10 NOTE — ED Notes (Signed)
Patient transported to X-ray 

## 2018-01-10 NOTE — ED Notes (Signed)
Pt stated she wanted to go home d/t noise and she felt like she wasn't being taken care of. MD ward spoke w/ pt bout leaving AMA. Pt still wanted to leave AMA,

## 2018-01-10 NOTE — ED Notes (Signed)
Patient saying she wants to leave, pt asking when she will get a room. Informed patient that we are holding patients in the ED because the hospital is full. Pt is trying to wait for admitting provider to see her to decide if she will stay or not

## 2018-01-12 DIAGNOSIS — R0789 Other chest pain: Secondary | ICD-10-CM | POA: Diagnosis not present

## 2018-01-12 DIAGNOSIS — F411 Generalized anxiety disorder: Secondary | ICD-10-CM | POA: Diagnosis not present

## 2018-01-12 DIAGNOSIS — I10 Essential (primary) hypertension: Secondary | ICD-10-CM | POA: Diagnosis not present

## 2018-01-19 DIAGNOSIS — E7849 Other hyperlipidemia: Secondary | ICD-10-CM | POA: Diagnosis not present

## 2018-01-19 DIAGNOSIS — R0789 Other chest pain: Secondary | ICD-10-CM | POA: Diagnosis not present

## 2018-01-19 DIAGNOSIS — I7090 Unspecified atherosclerosis: Secondary | ICD-10-CM | POA: Diagnosis not present

## 2018-01-19 DIAGNOSIS — K219 Gastro-esophageal reflux disease without esophagitis: Secondary | ICD-10-CM | POA: Diagnosis not present

## 2018-01-19 DIAGNOSIS — I119 Hypertensive heart disease without heart failure: Secondary | ICD-10-CM | POA: Diagnosis not present

## 2018-01-19 DIAGNOSIS — R002 Palpitations: Secondary | ICD-10-CM | POA: Diagnosis not present

## 2018-01-20 DIAGNOSIS — K219 Gastro-esophageal reflux disease without esophagitis: Secondary | ICD-10-CM | POA: Diagnosis not present

## 2018-01-20 DIAGNOSIS — R002 Palpitations: Secondary | ICD-10-CM | POA: Diagnosis not present

## 2018-01-20 DIAGNOSIS — R0789 Other chest pain: Secondary | ICD-10-CM | POA: Diagnosis not present

## 2018-01-20 DIAGNOSIS — E7849 Other hyperlipidemia: Secondary | ICD-10-CM | POA: Diagnosis not present

## 2018-01-20 DIAGNOSIS — I119 Hypertensive heart disease without heart failure: Secondary | ICD-10-CM | POA: Diagnosis not present

## 2018-01-20 DIAGNOSIS — I7090 Unspecified atherosclerosis: Secondary | ICD-10-CM | POA: Diagnosis not present

## 2018-01-22 DIAGNOSIS — R002 Palpitations: Secondary | ICD-10-CM | POA: Diagnosis not present

## 2018-01-23 DIAGNOSIS — I119 Hypertensive heart disease without heart failure: Secondary | ICD-10-CM | POA: Diagnosis not present

## 2018-01-27 DIAGNOSIS — E119 Type 2 diabetes mellitus without complications: Secondary | ICD-10-CM | POA: Diagnosis not present

## 2018-01-27 DIAGNOSIS — D696 Thrombocytopenia, unspecified: Secondary | ICD-10-CM | POA: Diagnosis not present

## 2018-01-27 DIAGNOSIS — E785 Hyperlipidemia, unspecified: Secondary | ICD-10-CM | POA: Diagnosis not present

## 2018-01-27 DIAGNOSIS — M810 Age-related osteoporosis without current pathological fracture: Secondary | ICD-10-CM | POA: Diagnosis not present

## 2018-01-27 DIAGNOSIS — I10 Essential (primary) hypertension: Secondary | ICD-10-CM | POA: Diagnosis not present

## 2018-01-27 DIAGNOSIS — Z0000 Encounter for general adult medical examination without abnormal findings: Secondary | ICD-10-CM | POA: Diagnosis not present

## 2018-01-27 DIAGNOSIS — E559 Vitamin D deficiency, unspecified: Secondary | ICD-10-CM | POA: Diagnosis not present

## 2018-01-30 ENCOUNTER — Other Ambulatory Visit: Payer: Self-pay | Admitting: Family Medicine

## 2018-02-02 ENCOUNTER — Other Ambulatory Visit: Payer: Self-pay | Admitting: Family Medicine

## 2018-02-02 DIAGNOSIS — M810 Age-related osteoporosis without current pathological fracture: Secondary | ICD-10-CM

## 2018-02-19 ENCOUNTER — Ambulatory Visit
Admission: RE | Admit: 2018-02-19 | Discharge: 2018-02-19 | Disposition: A | Discharge status: Home / Self Care | Payer: Medicare HMO | Source: Ambulatory Visit | Attending: Family Medicine | Admitting: Family Medicine

## 2018-02-19 DIAGNOSIS — E7849 Other hyperlipidemia: Secondary | ICD-10-CM | POA: Diagnosis not present

## 2018-02-19 DIAGNOSIS — I119 Hypertensive heart disease without heart failure: Secondary | ICD-10-CM | POA: Diagnosis not present

## 2018-02-19 DIAGNOSIS — Z780 Asymptomatic menopausal state: Secondary | ICD-10-CM | POA: Diagnosis not present

## 2018-02-19 DIAGNOSIS — M810 Age-related osteoporosis without current pathological fracture: Secondary | ICD-10-CM

## 2018-02-19 DIAGNOSIS — R0789 Other chest pain: Secondary | ICD-10-CM | POA: Diagnosis not present

## 2018-02-19 DIAGNOSIS — R002 Palpitations: Secondary | ICD-10-CM | POA: Diagnosis not present

## 2018-02-19 DIAGNOSIS — K219 Gastro-esophageal reflux disease without esophagitis: Secondary | ICD-10-CM | POA: Diagnosis not present

## 2018-02-19 DIAGNOSIS — I7090 Unspecified atherosclerosis: Secondary | ICD-10-CM | POA: Diagnosis not present

## 2018-02-23 DIAGNOSIS — F419 Anxiety disorder, unspecified: Secondary | ICD-10-CM | POA: Diagnosis not present

## 2018-02-23 DIAGNOSIS — L989 Disorder of the skin and subcutaneous tissue, unspecified: Secondary | ICD-10-CM | POA: Diagnosis not present

## 2018-02-23 DIAGNOSIS — I10 Essential (primary) hypertension: Secondary | ICD-10-CM | POA: Diagnosis not present

## 2018-02-23 DIAGNOSIS — R61 Generalized hyperhidrosis: Secondary | ICD-10-CM | POA: Diagnosis not present

## 2018-03-10 DIAGNOSIS — M25511 Pain in right shoulder: Secondary | ICD-10-CM | POA: Diagnosis not present

## 2018-03-10 DIAGNOSIS — M7581 Other shoulder lesions, right shoulder: Secondary | ICD-10-CM | POA: Diagnosis not present

## 2018-03-12 DIAGNOSIS — M545 Low back pain: Secondary | ICD-10-CM | POA: Diagnosis not present

## 2018-03-12 DIAGNOSIS — M810 Age-related osteoporosis without current pathological fracture: Secondary | ICD-10-CM | POA: Diagnosis not present

## 2018-03-12 DIAGNOSIS — R2681 Unsteadiness on feet: Secondary | ICD-10-CM | POA: Diagnosis not present

## 2018-03-12 DIAGNOSIS — M6281 Muscle weakness (generalized): Secondary | ICD-10-CM | POA: Diagnosis not present

## 2018-03-17 DIAGNOSIS — R300 Dysuria: Secondary | ICD-10-CM | POA: Diagnosis not present

## 2018-03-17 DIAGNOSIS — R102 Pelvic and perineal pain: Secondary | ICD-10-CM | POA: Diagnosis not present

## 2018-03-20 DIAGNOSIS — R2681 Unsteadiness on feet: Secondary | ICD-10-CM | POA: Diagnosis not present

## 2018-03-20 DIAGNOSIS — M545 Low back pain: Secondary | ICD-10-CM | POA: Diagnosis not present

## 2018-03-20 DIAGNOSIS — M810 Age-related osteoporosis without current pathological fracture: Secondary | ICD-10-CM | POA: Diagnosis not present

## 2018-03-20 DIAGNOSIS — M6281 Muscle weakness (generalized): Secondary | ICD-10-CM | POA: Diagnosis not present

## 2018-03-24 DIAGNOSIS — M810 Age-related osteoporosis without current pathological fracture: Secondary | ICD-10-CM | POA: Diagnosis not present

## 2018-03-24 DIAGNOSIS — M6281 Muscle weakness (generalized): Secondary | ICD-10-CM | POA: Diagnosis not present

## 2018-03-24 DIAGNOSIS — M545 Low back pain: Secondary | ICD-10-CM | POA: Diagnosis not present

## 2018-03-24 DIAGNOSIS — R2681 Unsteadiness on feet: Secondary | ICD-10-CM | POA: Diagnosis not present

## 2018-03-26 DIAGNOSIS — M6281 Muscle weakness (generalized): Secondary | ICD-10-CM | POA: Diagnosis not present

## 2018-03-26 DIAGNOSIS — M810 Age-related osteoporosis without current pathological fracture: Secondary | ICD-10-CM | POA: Diagnosis not present

## 2018-03-26 DIAGNOSIS — R2681 Unsteadiness on feet: Secondary | ICD-10-CM | POA: Diagnosis not present

## 2018-03-26 DIAGNOSIS — M545 Low back pain: Secondary | ICD-10-CM | POA: Diagnosis not present

## 2018-04-01 IMAGING — DX DG CHEST 2V
2 series · 2 of 2 positions shown · non-contrast
Comparison: 03/31/2016 chest radiograph.

CLINICAL DATA: Chest pain

EXAM:
CHEST  2 VIEW

[chest pa]
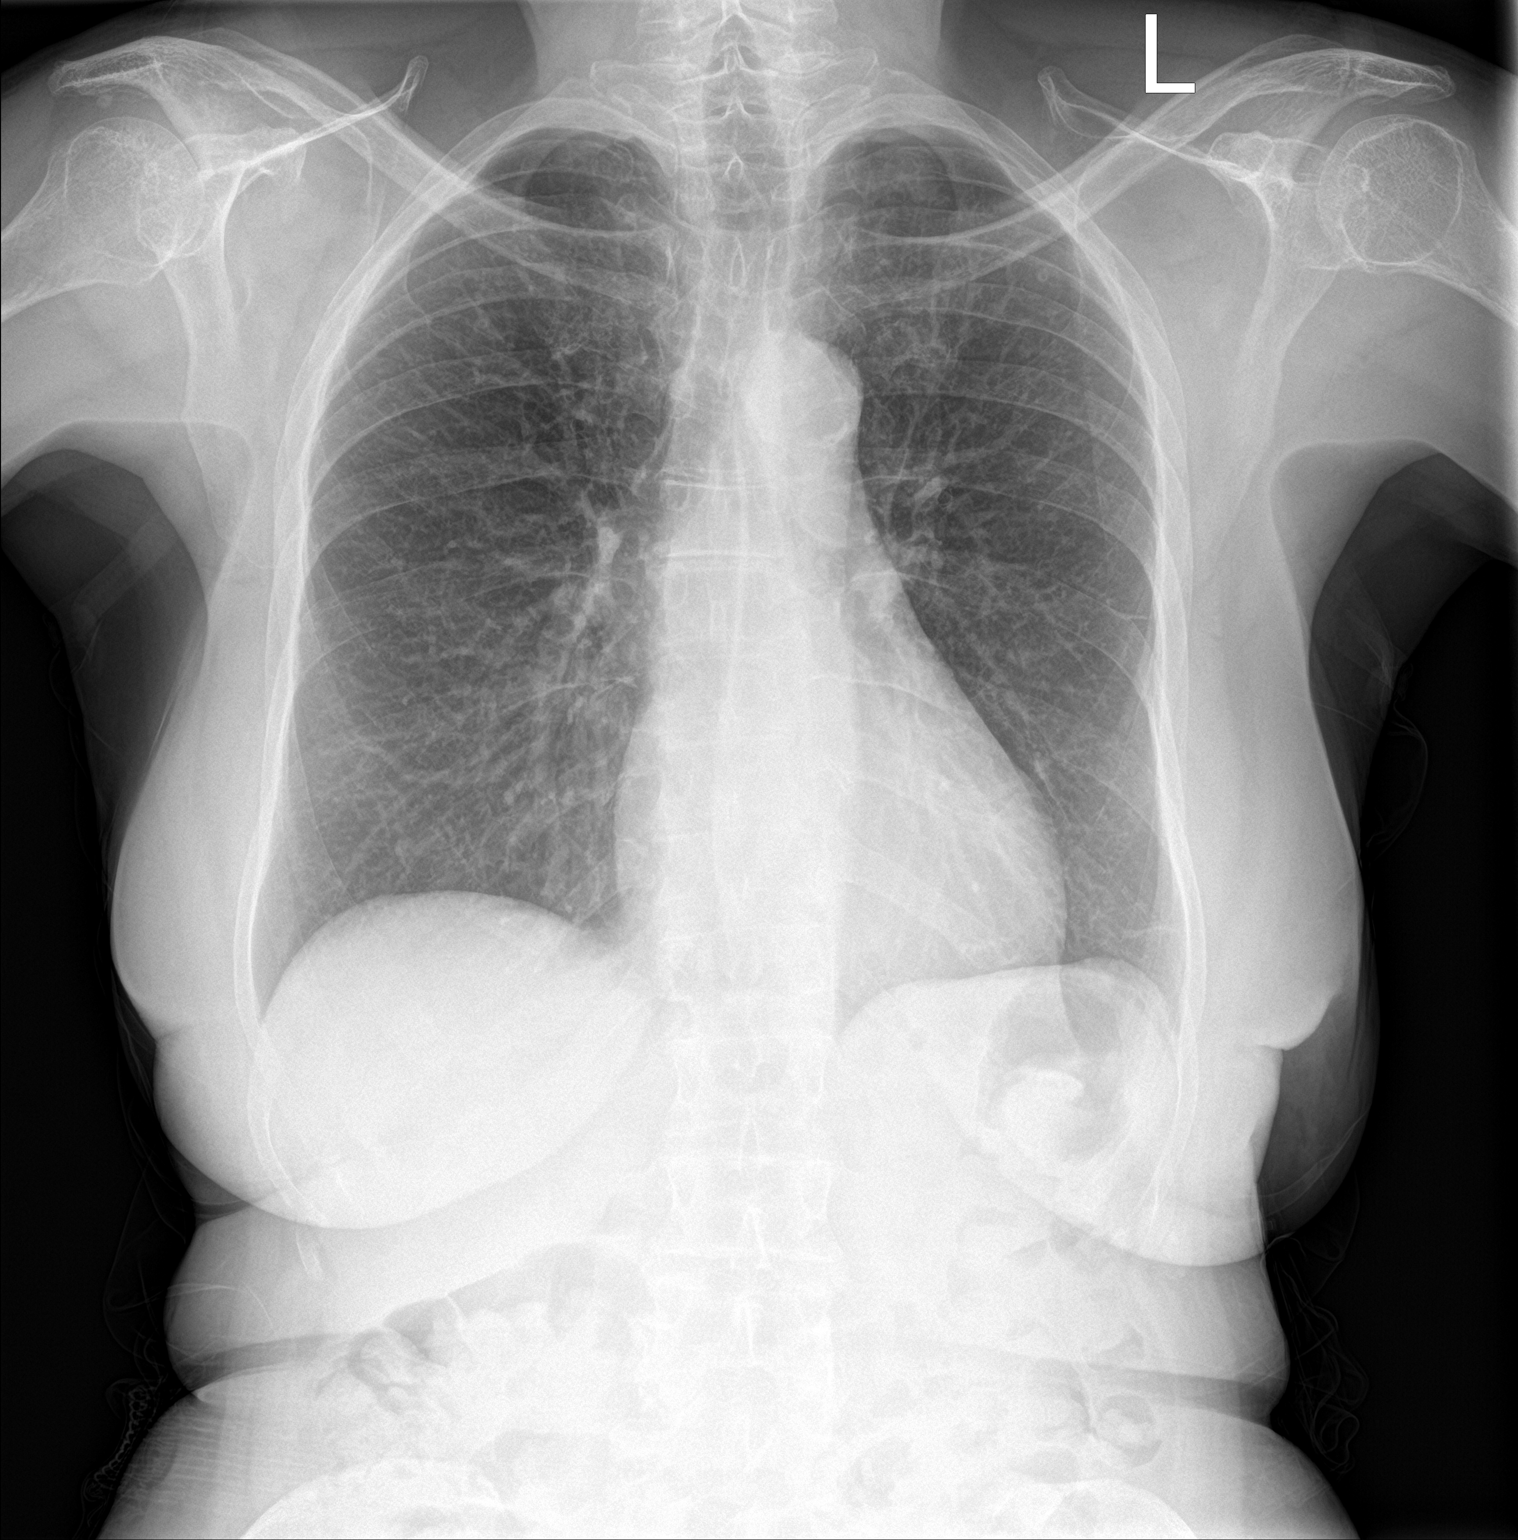

[chest lat]
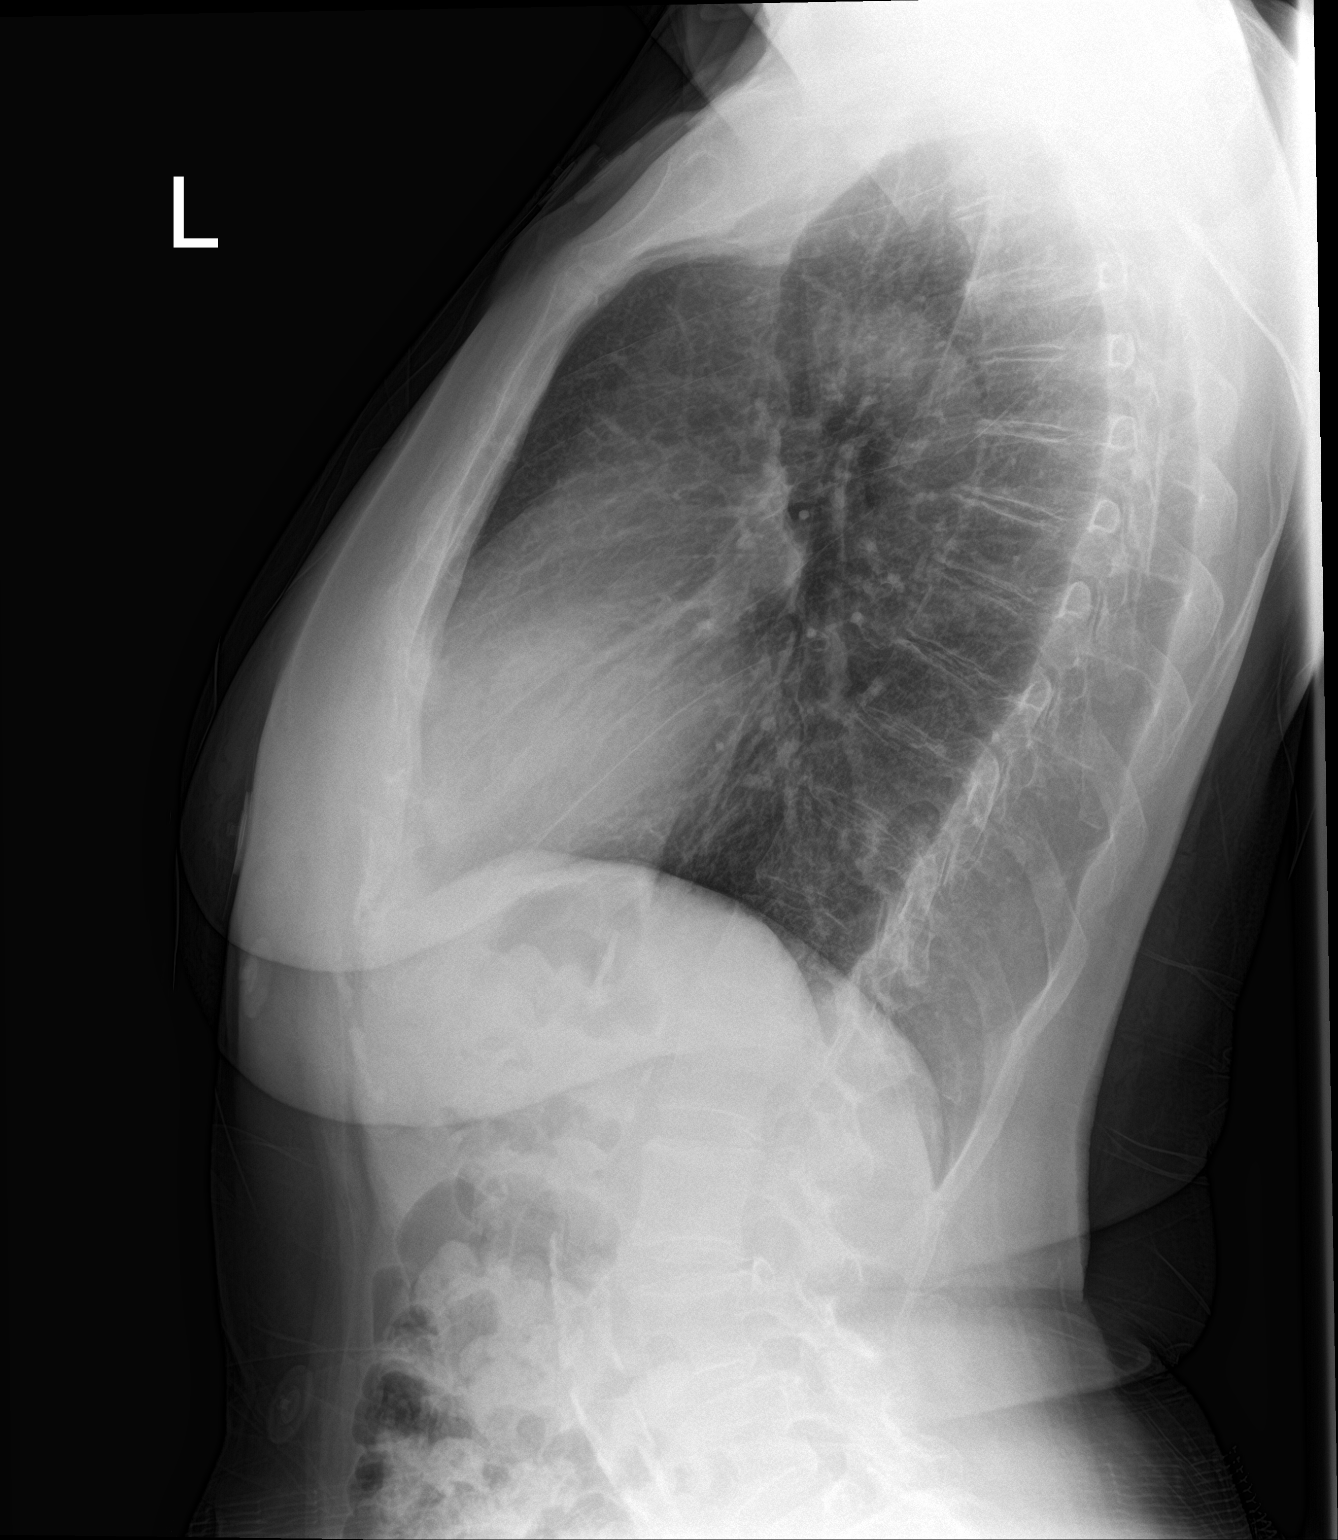

[2 of 2 positions shown; findings below may reference images not displayed]

FINDINGS: Stable cardiomediastinal silhouette with normal heart size and
aortic atherosclerosis. No pneumothorax. No pleural effusion. No
pulmonary edema. No acute consolidative airspace disease. Minimal
scarring versus atelectasis at the left costophrenic angle.
IMPRESSION: 1. Minimal scarring versus atelectasis at the left costophrenic
angle. Otherwise no active cardiopulmonary disease.
2. Aortic atherosclerosis.

## 2018-04-02 DIAGNOSIS — R102 Pelvic and perineal pain: Secondary | ICD-10-CM | POA: Diagnosis not present

## 2018-04-02 DIAGNOSIS — N3010 Interstitial cystitis (chronic) without hematuria: Secondary | ICD-10-CM | POA: Diagnosis not present

## 2018-04-07 DIAGNOSIS — R2681 Unsteadiness on feet: Secondary | ICD-10-CM | POA: Diagnosis not present

## 2018-04-07 DIAGNOSIS — M545 Low back pain: Secondary | ICD-10-CM | POA: Diagnosis not present

## 2018-04-07 DIAGNOSIS — M810 Age-related osteoporosis without current pathological fracture: Secondary | ICD-10-CM | POA: Diagnosis not present

## 2018-04-07 DIAGNOSIS — M6281 Muscle weakness (generalized): Secondary | ICD-10-CM | POA: Diagnosis not present

## 2018-04-09 DIAGNOSIS — R2681 Unsteadiness on feet: Secondary | ICD-10-CM | POA: Diagnosis not present

## 2018-04-09 DIAGNOSIS — M6281 Muscle weakness (generalized): Secondary | ICD-10-CM | POA: Diagnosis not present

## 2018-04-09 DIAGNOSIS — M545 Low back pain: Secondary | ICD-10-CM | POA: Diagnosis not present

## 2018-04-09 DIAGNOSIS — M810 Age-related osteoporosis without current pathological fracture: Secondary | ICD-10-CM | POA: Diagnosis not present

## 2018-04-10 DIAGNOSIS — N3010 Interstitial cystitis (chronic) without hematuria: Secondary | ICD-10-CM | POA: Diagnosis not present

## 2018-04-16 DIAGNOSIS — N3010 Interstitial cystitis (chronic) without hematuria: Secondary | ICD-10-CM | POA: Diagnosis not present

## 2018-04-16 DIAGNOSIS — R102 Pelvic and perineal pain: Secondary | ICD-10-CM | POA: Diagnosis not present

## 2018-04-27 DIAGNOSIS — R338 Other retention of urine: Secondary | ICD-10-CM | POA: Diagnosis not present

## 2018-04-28 DIAGNOSIS — R338 Other retention of urine: Secondary | ICD-10-CM | POA: Diagnosis not present

## 2018-05-05 DIAGNOSIS — E7849 Other hyperlipidemia: Secondary | ICD-10-CM | POA: Diagnosis not present

## 2018-05-05 DIAGNOSIS — I119 Hypertensive heart disease without heart failure: Secondary | ICD-10-CM | POA: Diagnosis not present

## 2018-05-05 DIAGNOSIS — K219 Gastro-esophageal reflux disease without esophagitis: Secondary | ICD-10-CM | POA: Diagnosis not present

## 2018-05-05 DIAGNOSIS — I7090 Unspecified atherosclerosis: Secondary | ICD-10-CM | POA: Diagnosis not present

## 2018-05-05 DIAGNOSIS — E785 Hyperlipidemia, unspecified: Secondary | ICD-10-CM | POA: Diagnosis not present

## 2018-05-07 DIAGNOSIS — E1169 Type 2 diabetes mellitus with other specified complication: Secondary | ICD-10-CM | POA: Diagnosis not present

## 2018-05-07 DIAGNOSIS — I10 Essential (primary) hypertension: Secondary | ICD-10-CM | POA: Diagnosis not present

## 2018-05-07 DIAGNOSIS — E785 Hyperlipidemia, unspecified: Secondary | ICD-10-CM | POA: Diagnosis not present

## 2018-05-07 DIAGNOSIS — N3010 Interstitial cystitis (chronic) without hematuria: Secondary | ICD-10-CM | POA: Diagnosis not present

## 2018-05-13 DIAGNOSIS — N3010 Interstitial cystitis (chronic) without hematuria: Secondary | ICD-10-CM | POA: Diagnosis not present

## 2018-07-05 ENCOUNTER — Encounter (HOSPITAL_COMMUNITY): Payer: Self-pay | Admitting: Emergency Medicine

## 2018-07-05 ENCOUNTER — Other Ambulatory Visit: Payer: Self-pay

## 2018-07-05 ENCOUNTER — Emergency Department (HOSPITAL_COMMUNITY): Payer: Medicare HMO

## 2018-07-05 ENCOUNTER — Observation Stay (HOSPITAL_COMMUNITY)
Admission: EM | Admit: 2018-07-05 | Discharge: 2018-07-07 | Disposition: A | Discharge status: Home / Self Care | Payer: Medicare HMO | Attending: Internal Medicine | Admitting: Internal Medicine

## 2018-07-05 DIAGNOSIS — Z880 Allergy status to penicillin: Secondary | ICD-10-CM | POA: Diagnosis not present

## 2018-07-05 DIAGNOSIS — I2510 Atherosclerotic heart disease of native coronary artery without angina pectoris: Secondary | ICD-10-CM | POA: Diagnosis not present

## 2018-07-05 DIAGNOSIS — E785 Hyperlipidemia, unspecified: Secondary | ICD-10-CM | POA: Insufficient documentation

## 2018-07-05 DIAGNOSIS — Z882 Allergy status to sulfonamides status: Secondary | ICD-10-CM | POA: Insufficient documentation

## 2018-07-05 DIAGNOSIS — K219 Gastro-esophageal reflux disease without esophagitis: Secondary | ICD-10-CM | POA: Diagnosis not present

## 2018-07-05 DIAGNOSIS — Z885 Allergy status to narcotic agent status: Secondary | ICD-10-CM | POA: Insufficient documentation

## 2018-07-05 DIAGNOSIS — F419 Anxiety disorder, unspecified: Secondary | ICD-10-CM | POA: Diagnosis not present

## 2018-07-05 DIAGNOSIS — R519 Headache, unspecified: Secondary | ICD-10-CM

## 2018-07-05 DIAGNOSIS — Z7982 Long term (current) use of aspirin: Secondary | ICD-10-CM | POA: Diagnosis not present

## 2018-07-05 DIAGNOSIS — I10 Essential (primary) hypertension: Secondary | ICD-10-CM

## 2018-07-05 DIAGNOSIS — Z888 Allergy status to other drugs, medicaments and biological substances status: Secondary | ICD-10-CM | POA: Insufficient documentation

## 2018-07-05 DIAGNOSIS — I160 Hypertensive urgency: Principal | ICD-10-CM | POA: Diagnosis present

## 2018-07-05 DIAGNOSIS — Z886 Allergy status to analgesic agent status: Secondary | ICD-10-CM | POA: Diagnosis not present

## 2018-07-05 DIAGNOSIS — R51 Headache: Secondary | ICD-10-CM | POA: Diagnosis not present

## 2018-07-05 DIAGNOSIS — E559 Vitamin D deficiency, unspecified: Secondary | ICD-10-CM | POA: Diagnosis not present

## 2018-07-05 DIAGNOSIS — D824 Hyperimmunoglobulin E [IgE] syndrome: Secondary | ICD-10-CM | POA: Insufficient documentation

## 2018-07-05 DIAGNOSIS — D696 Thrombocytopenia, unspecified: Secondary | ICD-10-CM | POA: Diagnosis not present

## 2018-07-05 DIAGNOSIS — I119 Hypertensive heart disease without heart failure: Secondary | ICD-10-CM | POA: Diagnosis not present

## 2018-07-05 DIAGNOSIS — Z79899 Other long term (current) drug therapy: Secondary | ICD-10-CM | POA: Insufficient documentation

## 2018-07-05 MED ORDER — ACETAMINOPHEN 325 MG PO TABS
650.0000 mg | ORAL_TABLET | Freq: Once | ORAL | Status: AC
  Administered 2018-07-06: 650 mg via ORAL
  Filled 2018-07-05: qty 2

## 2018-07-05 MED ORDER — SODIUM CHLORIDE 0.9 % IV BOLUS
500.0000 mL | Freq: Once | INTRAVENOUS | Status: AC
  Administered 2018-07-06: 500 mL via INTRAVENOUS

## 2018-07-05 MED ORDER — AMLODIPINE BESYLATE 5 MG PO TABS: 5.0000 mg | ORAL_TABLET | Freq: Once | ORAL | Status: DC

## 2018-07-05 NOTE — ED Triage Notes (Signed)
Patient c/o uncontrolled BP x2 days. Reports taking extra BP meds without relief. C/o headache. Denies chest pain.

## 2018-07-05 NOTE — ED Provider Notes (Signed)
[place] DEPT Provider Note   CSN: [id number] Arrival date & time: 07/05/18  2155     History   Chief Complaint Chief Complaint  Patient presents with  . Hypertension    HPI Donna Baldwin is a 77 y.o. female.  HPI 77 year old African-American female past medical history significant for hypertension and migraines presents to the emergency department today for evaluation of headache and high blood pressure.  Patient states that her blood pressure has been difficult to manage over the past month.  States that she is on 3 blood pressure medications.  Patient states that over the past 2 days her blood pressure has been significantly elevated.  Her [place] systolic blood pressure was 214.  Patient states that today she took her blood pressure this morning it was in the high 400Q systolic.  She took an extra dose of her by systolic.  She also took her spironolactone but did not take her amlodipine.  Patient states that she started to develop a generalized headache.  She also stated this morning she heard her heartbeat in her bilateral ears which has since resolved.  Patient reports intermittent blurry vision currently denies any vision change at this time.  Patient denies any photophobia but does report some phonophobia.  She reports history of migraines but states this feels different.  Patient states that her head "just hurts".  She states the headache is intermittent and denies any red flag symptoms including maximal and acute in onset.  Patient denies any associated chest pain, shortness of breath, paresthesias, ataxia, aphasia.  States that she last took her spironolactone approximately 12:00 this afternoon.  She has not taken her amlodipine this evening.  Patient denies any lower extremity swelling or calf tenderness.  Pt denies any fever, chill,  vision changes, lightheadedness, dizziness, congestion, neck pain, cp, sob, cough, abd pain, n/v/d, urinary  symptoms, change in bowel habits, melena, hematochezia, lower extremity paresthesias.   Past Medical History:  Diagnosis Date  . Anemia   . Anxiety   . Atherosclerosis   . Back pain   . Coronary artery disease   . GERD (gastroesophageal reflux disease)   . HTN (hypertension) 12/03/2013   CT scan-no pheochromocytoma  . Hyperlipidemia   . Hypertension   . Hypertensive heart disease without heart failure   . Thrombocytopenia ([place])   . Vitamin D deficiency     Patient Active Problem List   Diagnosis Date Noted  . History of diabetes mellitus 04/01/2017  . 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 Hereditary and idiopathic peripheral neuropathy 07/07/2014  . Aortic atherosclerosis ([place]) 12/03/2013  . Familial hyperlipidemia 12/03/2013  . HTN (hypertension) 12/03/2013  . GERD (gastroesophageal reflux disease) 03/24/2013  . Obstructive sleep apnea, ruled out 02/28/2013  . 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 Urticaria due to food allergy 08/31/2012  . Thrombocytopenia ([place])     Past Surgical History:  Procedure Laterality Date  . EYE SURGERY    . SHOULDER SURGERY Bilateral   . TONSILLECTOMY       OB History   None      Home Medications    Prior to Admission medications   Medication Sig Start Date End Date Taking? Authorizing Provider  amLODipine (NORVASC) 2.5 MG tablet Take 3 tablets (7.5 mg total) by mouth daily. Patient taking differently: Take 2.5-5 mg by mouth See admin instructions. 2.5 mg by mouth in the morning and 5 mg at bedtime 01/06/18 07/05/18 Yes Dorie Rank,  MD  aspirin 81 MG tablet Take 81 mg by mouth daily as needed (for pain and pressure).    Yes [provider]  BYSTOLIC 5 MG tablet Take 10 mg by mouth daily.  06/22/18  Yes [provider]  cetirizine (ZYRTEC) 10 MG tablet Take 10 mg by mouth daily as needed for allergies.    Yes [provider]  cimetidine (TAGAMET) 400 MG tablet Take 400 mg by mouth 2  (two) times daily. 04/02/18  Yes [provider]  docusate sodium (COLACE) 100 MG capsule Take 100 mg by mouth daily as needed for mild constipation.   Yes [provider]  EPINEPHrine 0.3 mg/0.3 mL IJ SOAJ injection Inject 0.3 mg into the muscle once as needed (for anaphylaxis).  05/31/16  Yes [provider]  LORazepam (ATIVAN) 1 MG tablet Take 0.5 tablets (0.5 mg total) by mouth every 8 (eight) hours as needed for anxiety. 07/29/15  Yes Jola Schmidt, MD  Magnesium Hydroxide (MILK OF MAGNESIA PO) Take 15 mLs by mouth daily as needed (CONSTIPATION).   Yes [provider]  NON FORMULARY [employer] Warm Vanilla Body Butter: Apply to dry skin daily   Yes [provider]  spironolactone (ALDACTONE) 5 mg/mL SUSP oral suspension Take 5 mg/kg by mouth daily.   Yes [provider]  albuterol (PROVENTIL HFA;VENTOLIN HFA) 108 (90 Base) MCG/ACT inhaler Inhale 2 puffs into the lungs every 4 (four) hours as needed for wheezing or shortness of breath. Patient not taking: Reported on 07/05/2018 03/31/16   Konrad Felix, PA  famotidine (PEPCID) 20 MG tablet Take 1 tablet (20 mg total) by mouth 2 (two) times daily. Patient not taking: Reported on 01/09/2018 07/16/16   Dorie Rank, MD  spironolactone (ALDACTONE) 25 MG tablet Take 12.5 mg by mouth daily. 06/29/18   [provider]    Family History Family History  Problem Relation Age of Onset  . Cancer Father     Social History Social History   Tobacco Use  . Smoking status: Never Smoker  . Smokeless tobacco: Never Used  Substance Use Topics  . Alcohol use: No    Alcohol/week: 0.0 oz  . Drug use: No     Allergies   Acetaminophen; Beef-derived products; Codeine; Epinephrine; Latex; Morphine and related; Penicillins; Sertraline; Shellfish allergy; Solu-medrol [methylprednisolone]; Sulfa antibiotics; Barbiturates; Bee pollen; Bee venom; Diovan [valsartan]; Gabapentin; Ivp dye [iodinated diagnostic  agents]; Livalo [pitavastatin]; Nutritional supplements; Other; Oxycodone; Promethazine-dm; Tape; Albuterol; Pred forte [prednisolone acetate]; Celebrex [celecoxib]; Lanolin; Mobic [meloxicam]; Nitrates, organic; Petroleum jelly [petrolatum]; Soy allergy; Strawberry extract; and Tylox [oxycodone-acetaminophen]   Review of Systems Review of Systems  All other systems reviewed and are negative.    Physical Exam Updated Vital Signs BP (!) 197/86 (BP Location: Left Arm)   Pulse 78   Temp 97.9 F (36.6 C) (Oral)   Resp 16   Ht 5\' 3"  (1.6 m)   Wt 61.2 kg (135 lb)   SpO2 100%   BMI 23.91 kg/m   Physical Exam  Constitutional: She is oriented to person, place, and time. She appears well-developed and well-nourished.  Non-toxic appearance. No distress.  HENT:  Head: Normocephalic and atraumatic.  Nose: Nose normal.  Mouth/Throat: Oropharynx is clear and moist.  Patient has no pain with palpation of the bilateral temporal region.  Eyes: Pupils are equal, round, and reactive to light. Conjunctivae are normal. Right eye exhibits no discharge. Left eye exhibits no discharge.  Neck: Normal range of motion. Neck  supple.  No c spine midline tenderness. No paraspinal tenderness. No deformities or step offs noted. Full ROM. Supple. No nuchal rigidity.    Cardiovascular: Normal rate, regular rhythm, normal heart sounds and intact distal pulses.  Pulmonary/Chest: Effort normal and breath sounds normal. No respiratory distress. She exhibits no tenderness.  Abdominal: Soft. Bowel sounds are normal. There is no tenderness. There is no rebound and no guarding.  Musculoskeletal: Normal range of motion. She exhibits no tenderness.  Lymphadenopathy:    She has no cervical adenopathy.  Neurological: She is alert and oriented to person, place, and time.  The patient is alert, attentive, and oriented x 3. Speech is clear. Cranial nerve II-VII grossly intact. Negative pronator drift. Sensation intact.  Strength 5/5 in all extremities. Reflexes 2+ and symmetric at biceps, triceps, knees, and ankles. Rapid alternating movement intact.  Finger-to-nose with some mild ataxia bilaterally.  However she has normal shin to heel.  Patient ambulated with normal gait.. Romberg is absent. Posture and gait normal.   Skin: Skin is warm and dry. Capillary refill takes less than 2 seconds.  Psychiatric: Her behavior is normal. Judgment and thought content normal.  Nursing note and vitals reviewed.    ED Treatments / Results  Labs (all labs ordered are listed, but only abnormal results are displayed) Labs Reviewed  BASIC METABOLIC PANEL - Abnormal; Notable for the following components:      Result Value   Glucose, Bld 113 (*)    Creatinine, Ser 1.05 (*)    GFR calc non Af Amer 50 (*)    GFR calc Af Amer 58 (*)    All other components within normal limits  CBC WITH DIFFERENTIAL/PLATELET - Abnormal; Notable for the following components:   WBC 3.7 (*)    RBC 5.55 (*)    MCV 73.3 (*)    MCH 22.9 (*)    All other components within normal limits  COMPREHENSIVE METABOLIC PANEL - Abnormal; Notable for the following components:   Glucose, Bld 105 (*)    Total Protein 6.3 (*)    All other components within normal limits  CBC - Abnormal; Notable for the following components:   WBC 3.9 (*)    RBC 5.23 (*)    MCV 73.4 (*)    MCH 23.1 (*)    Platelets 139 (*)    All other components within normal limits    EKG None  Radiology Ct Head Wo Contrast  Result Date: 07/06/2018 CLINICAL DATA:  Acute headache with normal neuro exam. Uncontrolled blood pressure for 2 days EXAM: CT HEAD WITHOUT CONTRAST TECHNIQUE: Contiguous axial images were obtained from the base of the skull through the vertex without intravenous contrast. COMPARISON:  01/10/2018 FINDINGS: Brain: No evidence of acute infarction, hemorrhage, hydrocephalus, extra-axial collection or mass lesion/mass effect. Subtle low-density in the cerebral  white matter correlating with mild chronic small vessel ischemia on a 2016 brain MRI. Vascular: No hyperdense vessel or unexpected calcification. Skull: Normal. Negative for fracture or focal lesion. Sinuses/Orbits: Left cataract resection. IMPRESSION: No acute finding or explanation for headache. Electronically Signed   By: Monte Fantasia M.D.   On: 07/06/2018 00:07    Procedures Procedures (including critical care time)  Medications Ordered in ED Medications  amLODipine (NORVASC) tablet 5 mg (has no administration in time range)  sodium chloride 0.9 % bolus 500 mL (has no administration in time range)  acetaminophen (TYLENOL) tablet 650 mg (has no administration in time range)     Initial Impression /  Assessment and Plan / ED Course  I have reviewed the triage vital signs and the nursing notes.  Pertinent labs & imaging results that were available during my care of the patient were reviewed by me and considered in my medical decision making (see chart for details).     Patient presents to the ED for evaluation of hypertension, headache.  She also reports intermittent blurry vision.  On exam patient overall well-appearing and nontoxic.  Patient's blood pressure was initially 947 systolic however this has resolved on its own without any intervention.  There is significant vital sign abnormality.  Basic lab work reassuring.  No leukocytosis.  Normal hemoglobin.  No significant elect light derangement.  Normal kidney function.  Patient does have some ataxia bilaterally for finger-to-nose but no other focal neuro deficit noted on exam.  Unknown if patient's headache is related to her high blood pressure or if the high blood pressure is secondary to a migraine headache.  Patient has a several allergies to medications unable to give migraine cocktail.  Patient given Tylenol and fluids.  Given that blood pressure has started to decline without any other interventions we will hold on oral  amlodipine at this time.  The patient states that after her blood pressure has come down her headache has resolved.  CT scan of head was performed given patient's finger-to-nose ataxia.  This was reassuring.  Patient does have history of TIA but no history of stroke.  She has significant risk factors including hyperlipidemia, hypertension.  I spoke with Dr. Lorraine Lax with neurology concerning patient.  He states that given patient's significant risk factors with her intermittent blurry vision and ataxia although the bilateral ataxia is less concerning for CVA patient would benefit from MRI.  Unable to obtain MRI at this time.  Will admit patient for hypertensive urgency and CVA rule out with MRI.  He denies chest pain or shortness of breath.  Low suspicion for ACS, PE, dissection.  Patient has no red flag symptoms that be concerning for temporal arteritis, meningitis, aneurysm.  Dr. Maudie Mercury with hospital medicine who agrees to admission will see patient in the ED and place admission orders.  Patient was also seen by my attending who is agreed with the above plan.   Final Clinical Impressions(s) / ED Diagnoses   Final diagnoses:  Hypertension, unspecified type  Nonintractable headache, unspecified chronicity pattern, unspecified headache type    ED Discharge Orders    None       Aaron Edelman 07/06/18 0631    Margette Fast, MD 07/06/18 1134

## 2018-07-05 NOTE — ED Notes (Signed)
ED Provider at bedside. 

## 2018-07-06 ENCOUNTER — Other Ambulatory Visit: Payer: Self-pay

## 2018-07-06 ENCOUNTER — Observation Stay (HOSPITAL_BASED_OUTPATIENT_CLINIC_OR_DEPARTMENT_OTHER): Payer: Medicare HMO

## 2018-07-06 ENCOUNTER — Observation Stay (HOSPITAL_COMMUNITY): Payer: Medicare HMO

## 2018-07-06 ENCOUNTER — Encounter (HOSPITAL_COMMUNITY): Payer: Self-pay | Admitting: Internal Medicine

## 2018-07-06 DIAGNOSIS — I160 Hypertensive urgency: Secondary | ICD-10-CM | POA: Diagnosis present

## 2018-07-06 DIAGNOSIS — I119 Hypertensive heart disease without heart failure: Secondary | ICD-10-CM | POA: Diagnosis not present

## 2018-07-06 DIAGNOSIS — E785 Hyperlipidemia, unspecified: Secondary | ICD-10-CM | POA: Diagnosis not present

## 2018-07-06 DIAGNOSIS — R51 Headache: Secondary | ICD-10-CM

## 2018-07-06 DIAGNOSIS — E559 Vitamin D deficiency, unspecified: Secondary | ICD-10-CM | POA: Diagnosis not present

## 2018-07-06 DIAGNOSIS — I5030 Unspecified diastolic (congestive) heart failure: Secondary | ICD-10-CM | POA: Diagnosis not present

## 2018-07-06 DIAGNOSIS — I2510 Atherosclerotic heart disease of native coronary artery without angina pectoris: Secondary | ICD-10-CM | POA: Diagnosis not present

## 2018-07-06 DIAGNOSIS — K219 Gastro-esophageal reflux disease without esophagitis: Secondary | ICD-10-CM | POA: Diagnosis not present

## 2018-07-06 DIAGNOSIS — G459 Transient cerebral ischemic attack, unspecified: Secondary | ICD-10-CM | POA: Insufficient documentation

## 2018-07-06 DIAGNOSIS — F419 Anxiety disorder, unspecified: Secondary | ICD-10-CM | POA: Diagnosis not present

## 2018-07-06 DIAGNOSIS — D824 Hyperimmunoglobulin E [IgE] syndrome: Secondary | ICD-10-CM | POA: Diagnosis not present

## 2018-07-06 LAB — T4, FREE: FREE T4: 0.87 ng/dL (ref 0.82–1.77)

## 2018-07-06 LAB — ECHOCARDIOGRAM COMPLETE
HEIGHTINCHES: 63 in
Weight: 2160 oz

## 2018-07-06 LAB — CBC WITH DIFFERENTIAL/PLATELET
BASOS ABS: 0 10*3/uL (ref 0.0–0.1)
Basophils Relative: 0 %
EOS ABS: 0 10*3/uL (ref 0.0–0.7)
Eosinophils Relative: 1 %
HCT: 40.7 % (ref 36.0–46.0)
Hemoglobin: 12.7 g/dL (ref 12.0–15.0)
LYMPHS ABS: 1.3 10*3/uL (ref 0.7–4.0)
LYMPHS PCT: 34 %
MCH: 22.9 pg — ABNORMAL LOW (ref 26.0–34.0)
MCHC: 31.2 g/dL (ref 30.0–36.0)
MCV: 73.3 fL — ABNORMAL LOW (ref 78.0–100.0)
Monocytes Absolute: 0.4 10*3/uL (ref 0.1–1.0)
Monocytes Relative: 12 %
Neutro Abs: 2 10*3/uL (ref 1.7–7.7)
Neutrophils Relative %: 53 %
Platelets: 150 10*3/uL (ref 150–400)
RBC: 5.55 MIL/uL — AB (ref 3.87–5.11)
RDW: 14.9 % (ref 11.5–15.5)
WBC: 3.7 10*3/uL — AB (ref 4.0–10.5)

## 2018-07-06 LAB — COMPREHENSIVE METABOLIC PANEL
ALBUMIN: 3.7 g/dL (ref 3.5–5.0)
ALK PHOS: 81 U/L (ref 38–126)
ALT: 12 U/L (ref 0–44)
AST: 21 U/L (ref 15–41)
Anion gap: 7 (ref 5–15)
BILIRUBIN TOTAL: 0.5 mg/dL (ref 0.3–1.2)
BUN: 13 mg/dL (ref 8–23)
CALCIUM: 9 mg/dL (ref 8.9–10.3)
CO2: 26 mmol/L (ref 22–32)
CREATININE: 0.87 mg/dL (ref 0.44–1.00)
Chloride: 110 mmol/L (ref 98–111)
GFR calc Af Amer: >60 mL/min (ref >60)
GFR calc non Af Amer: >60 mL/min (ref >60)
GLUCOSE: 105 mg/dL — AB (ref 70–99)
Potassium: 4.2 mmol/L (ref 3.5–5.1)
SODIUM: 143 mmol/L (ref 135–145)
Total Protein: 6.3 g/dL — ABNORMAL LOW (ref 6.5–8.1)

## 2018-07-06 LAB — BASIC METABOLIC PANEL
ANION GAP: 10 (ref 5–15)
BUN: 13 mg/dL (ref 8–23)
CALCIUM: 9.6 mg/dL (ref 8.9–10.3)
CO2: 26 mmol/L (ref 22–32)
Chloride: 106 mmol/L (ref 98–111)
Creatinine, Ser: 1.05 mg/dL — ABNORMAL HIGH (ref 0.44–1.00)
GFR calc Af Amer: 58 mL/min — ABNORMAL LOW (ref >60)
GFR, EST NON AFRICAN AMERICAN: 50 mL/min — AB (ref >60)
Glucose, Bld: 113 mg/dL — ABNORMAL HIGH (ref 70–99)
POTASSIUM: 3.6 mmol/L (ref 3.5–5.1)
SODIUM: 142 mmol/L (ref 135–145)

## 2018-07-06 LAB — CBC
HCT: 38.4 % (ref 36.0–46.0)
Hemoglobin: 12.1 g/dL (ref 12.0–15.0)
MCH: 23.1 pg — AB (ref 26.0–34.0)
MCHC: 31.5 g/dL (ref 30.0–36.0)
MCV: 73.4 fL — ABNORMAL LOW (ref 78.0–100.0)
PLATELETS: 139 10*3/uL — AB (ref 150–400)
RBC: 5.23 MIL/uL — ABNORMAL HIGH (ref 3.87–5.11)
RDW: 15 % (ref 11.5–15.5)
WBC: 3.9 10*3/uL — ABNORMAL LOW (ref 4.0–10.5)

## 2018-07-06 LAB — GLUCOSE, CAPILLARY: GLUCOSE-CAPILLARY: 109 mg/dL — AB (ref 70–99)

## 2018-07-06 LAB — TSH: TSH: 3.139 u[IU]/mL (ref 0.350–4.500)

## 2018-07-06 MED ORDER — DOCUSATE SODIUM 100 MG PO CAPS: 100.0000 mg | ORAL_CAPSULE | Freq: Every day | ORAL | Status: DC | PRN

## 2018-07-06 MED ORDER — HYDRALAZINE HCL 20 MG/ML IJ SOLN: 10.0000 mg | INTRAMUSCULAR | Status: DC | PRN

## 2018-07-06 MED ORDER — AMLODIPINE BESYLATE 5 MG PO TABS
5.0000 mg | ORAL_TABLET | Freq: Two times a day (BID) | ORAL | Status: DC
  Administered 2018-07-06 – 2018-07-07 (×2): 5 mg via ORAL
  Filled 2018-07-06 (×3): qty 1

## 2018-07-06 MED ORDER — NEBIVOLOL HCL 5 MG PO TABS
5.0000 mg | ORAL_TABLET | Freq: Every day | ORAL | Status: DC
  Filled 2018-07-06: qty 1

## 2018-07-06 MED ORDER — HYDRALAZINE HCL 25 MG PO TABS
25.0000 mg | ORAL_TABLET | Freq: Three times a day (TID) | ORAL | Status: DC
  Administered 2018-07-06: 25 mg via ORAL
  Filled 2018-07-06: qty 1

## 2018-07-06 MED ORDER — BUSPIRONE HCL 5 MG PO TABS
5.0000 mg | ORAL_TABLET | Freq: Two times a day (BID) | ORAL | Status: DC
  Filled 2018-07-06 (×2): qty 1

## 2018-07-06 MED ORDER — ACETAMINOPHEN 650 MG RE SUPP: 650.0000 mg | Freq: Four times a day (QID) | RECTAL | Status: DC | PRN

## 2018-07-06 MED ORDER — ASPIRIN EC 81 MG PO TBEC
81.0000 mg | DELAYED_RELEASE_TABLET | Freq: Every day | ORAL | Status: DC | PRN
  Filled 2018-07-06: qty 1

## 2018-07-06 MED ORDER — LORAZEPAM 0.5 MG PO TABS
0.5000 mg | ORAL_TABLET | Freq: Three times a day (TID) | ORAL | Status: DC | PRN
  Administered 2018-07-06: 0.5 mg via ORAL
  Filled 2018-07-06 (×2): qty 1

## 2018-07-06 MED ORDER — ENOXAPARIN SODIUM 40 MG/0.4ML ~~LOC~~ SOLN
40.0000 mg | SUBCUTANEOUS | Status: DC
  Filled 2018-07-06 (×2): qty 0.4

## 2018-07-06 MED ORDER — HYDRALAZINE HCL 50 MG PO TABS
50.0000 mg | ORAL_TABLET | Freq: Three times a day (TID) | ORAL | Status: DC
  Administered 2018-07-06 – 2018-07-07 (×3): 50 mg via ORAL
  Filled 2018-07-06 (×4): qty 1

## 2018-07-06 MED ORDER — SODIUM CHLORIDE 0.9% FLUSH
3.0000 mL | Freq: Two times a day (BID) | INTRAVENOUS | Status: DC
  Administered 2018-07-06 – 2018-07-07 (×4): 3 mL via INTRAVENOUS

## 2018-07-06 MED ORDER — ACETAMINOPHEN 325 MG PO TABS
650.0000 mg | ORAL_TABLET | Freq: Four times a day (QID) | ORAL | Status: DC | PRN
  Administered 2018-07-06 – 2018-07-07 (×3): 650 mg via ORAL
  Filled 2018-07-06 (×4): qty 2

## 2018-07-06 MED ORDER — LORATADINE 10 MG PO TABS
10.0000 mg | ORAL_TABLET | Freq: Every day | ORAL | Status: DC
  Filled 2018-07-06 (×2): qty 1

## 2018-07-06 MED ORDER — SODIUM CHLORIDE 0.9 % IV SOLN: 250.0000 mL | INTRAVENOUS | Status: DC | PRN

## 2018-07-06 MED ORDER — SODIUM CHLORIDE 0.9% FLUSH: 3.0000 mL | INTRAVENOUS | Status: DC | PRN

## 2018-07-06 MED ORDER — SPIRONOLACTONE 25 MG PO TABS
12.5000 mg | ORAL_TABLET | Freq: Every day | ORAL | Status: DC
  Administered 2018-07-06 – 2018-07-07 (×2): 12.5 mg via ORAL
  Filled 2018-07-06 (×2): qty 0.5

## 2018-07-06 NOTE — Care Management Obs Status (Signed)
[facility] NOTIFICATION   Patient Details  Name: Donna Baldwin MRN: [id number] Date of Birth: [birth year]   Medicare Observation Status Notification Given:  Yes    MahabirJuliann Pulse, RN 07/06/2018, 4:17 PM

## 2018-07-06 NOTE — ED Notes (Signed)
ED TO INPATIENT HANDOFF REPORT  Name/Age/Gender Donna Baldwin 77 y.o. female  Code Status   Home/SNF/Other Home  Chief Complaint Elevated Blood Pressure 200/106  Level of Care/Admitting Diagnosis ED Disposition    ED Disposition Condition Comment   Admit  Hospital Area: [facility] [[id number]]  Level of Care: Telemetry [5]  Admit to tele based on following criteria: Monitor for Ischemic changes  Diagnosis: TIA (transient ischemic attack) [765465]  Admitting Physician: Jani Gravel [[id number]]  Attending Physician: Jani Gravel [[id number]]  PT Class (Do Not Modify): Observation [104]  PT Acc Code (Do Not Modify): Observation [10022]       Medical History Past Medical History:  Diagnosis Date  . Anemia   . Anxiety   . Atherosclerosis   . Back pain   . Coronary artery disease   . GERD (gastroesophageal reflux disease)   . HTN (hypertension) 12/03/2013   CT scan-no pheochromocytoma  . Hyperlipidemia   . Hypertension   . Hypertensive heart disease without heart failure   . Thrombocytopenia ([place])   . Vitamin D deficiency     Allergies Allergies  Allergen Reactions  . Acetaminophen Other (See Comments)    Tremor, anxiety  . Beef-Derived Products Anaphylaxis  . Codeine Hives and Anxiety  . Epinephrine Palpitations  . Latex Rash  . Morphine And Related Other (See Comments)    Tremors, increased heart rate, excitement, confusion per pt  . Penicillins Hives    Has patient had a PCN reaction causing immediate rash, facial/tongue/throat swelling, SOB or lightheadedness with hypotension: Yes Has patient had a PCN reaction causing severe rash involving mucus membranes or skin necrosis: Yes Has patient had a PCN reaction that required hospitalization: Yes Has patient had a PCN reaction occurring within the last 10 years: No If all of the above answers are "NO", then may proceed with Cephalosporin use.   . Sertraline Other (See Comments)    Numbness in mouth  and hyperactivity  . Shellfish Allergy Hives  . Solu-Medrol [Methylprednisolone] Other (See Comments)    Numbness and tingling in mouth  . Sulfa Antibiotics Hives and Rash  . Barbiturates Other (See Comments)    Excitement   . Bee Pollen Hives  . Bee Venom Hives  . Diovan [Valsartan] Nausea Only  . Gabapentin Other (See Comments)    Visual disturbance  . Ivp Dye [Iodinated Diagnostic Agents] Other (See Comments)    ANAPHYLACTIC  . Livalo [Pitavastatin] Swelling and Other (See Comments)    Facial swelling  . Nutritional Supplements Other (See Comments)    Nuts, strawberries, bicarbonate of soda,  . Other Palpitations and Other (See Comments)    NARCOTIC ANALGESICS-TREMORS, INCREASED HEART RATE Hmg-Coa Reductase Inhibitors - intolerance unknown   . Oxycodone Swelling, Rash and Cough  . Promethazine-Dm Other (See Comments)    "Felt funny"  . Tape Rash and Other (See Comments)    Red blotches Red blotches  . Albuterol Other (See Comments)    Caused wheezing  . Pred Forte [Prednisolone Acetate] Other (See Comments)    Patient is seeing odd color and images while using this  . Celebrex [Celecoxib] Swelling and Other (See Comments)    Swelling and numbness (mouth)  . Lanolin Itching  . Mobic [Meloxicam] Swelling and Other (See Comments)    Swelling and numbness in mouth  . Nitrates, Organic Rash and Other (See Comments)    Chest tightness also  . Petroleum Jelly [Petrolatum] Rash  . Soy Allergy Rash  .  Strawberry Extract Rash  . Tylox [Oxycodone-Acetaminophen] Swelling, Rash and Cough    IV Location/Drains/Wounds Patient Lines/Drains/Airways Status   Active Line/Drains/Airways    Name:   Placement date:   Placement time:   Site:   Days:   Peripheral IV 07/05/18 Right Antecubital   07/05/18    2338    Antecubital   1          Labs/Imaging Results for orders placed or performed during the hospital encounter of 07/05/18 (from the past 48 hour(s))  Basic metabolic panel      Status: Abnormal   Collection Time: 07/05/18 11:39 PM  Result Value Ref Range   Sodium 142 135 - 145 mmol/L   Potassium 3.6 3.5 - 5.1 mmol/L   Chloride 106 98 - 111 mmol/L    Comment: Please note change in reference range.   CO2 26 22 - 32 mmol/L   Glucose, Bld 113 (H) 70 - 99 mg/dL    Comment: Please note change in reference range.   BUN 13 8 - 23 mg/dL    Comment: Please note change in reference range.   Creatinine, Ser 1.05 (H) 0.44 - 1.00 mg/dL   Calcium 9.6 8.9 - 10.3 mg/dL   GFR calc non Af Amer 50 (L) >60 mL/min   GFR calc Af Amer 58 (L) >60 mL/min    Comment: (NOTE) The eGFR has been calculated using the CKD EPI equation. This calculation has not been validated in all clinical situations. eGFR's persistently <60 mL/min signify possible Chronic Kidney Disease.    Anion gap 10 5 - 15    Comment: Performed at [facility], [place] [address], [place], [place] [address]  CBC with Differential     Status: Abnormal   Collection Time: 07/05/18 11:39 PM  Result Value Ref Range   WBC 3.7 (L) 4.0 - 10.5 K/uL   RBC 5.55 (H) 3.87 - 5.11 MIL/uL   Hemoglobin 12.7 12.0 - 15.0 g/dL   HCT 40.7 36.0 - 46.0 %   MCV 73.3 (L) 78.0 - 100.0 fL   MCH 22.9 (L) 26.0 - 34.0 pg   MCHC 31.2 30.0 - 36.0 g/dL   RDW 14.9 11.5 - 15.5 %   Platelets 150 150 - 400 K/uL   Neutrophils Relative % 53 %   Lymphocytes Relative 34 %   Monocytes Relative 12 %   Eosinophils Relative 1 %   Basophils Relative 0 %   Neutro Abs 2.0 1.7 - 7.7 K/uL   Lymphs Abs 1.3 0.7 - 4.0 K/uL   Monocytes Absolute 0.4 0.1 - 1.0 K/uL   Eosinophils Absolute 0.0 0.0 - 0.7 K/uL   Basophils Absolute 0.0 0.0 - 0.1 K/uL   RBC Morphology ELLIPTOCYTES     Comment: BURR CELLS Performed at [facility], [place] [address] [place], [place] [address]    Ct Head Wo Contrast  Result Date: 07/06/2018 CLINICAL DATA:  Acute headache with normal neuro exam. Uncontrolled blood pressure for 2 days EXAM: CT  HEAD WITHOUT CONTRAST TECHNIQUE: Contiguous axial images were obtained from the base of the skull through the vertex without intravenous contrast. COMPARISON:  01/10/2018 FINDINGS: Brain: No evidence of acute infarction, hemorrhage, hydrocephalus, extra-axial collection or mass lesion/mass effect. Subtle low-density in the cerebral white matter correlating with mild chronic small vessel ischemia on a 2016 brain MRI. Vascular: No hyperdense vessel or unexpected calcification. Skull: Normal. Negative for fracture or focal lesion. Sinuses/Orbits: Left cataract resection. IMPRESSION: No acute finding  or explanation for headache. Electronically Signed   By: Monte Fantasia M.D.   On: 07/06/2018 00:07    Pending Labs Unresulted Labs (From admission, onward)   None      Vitals/Pain Today's Vitals   07/06/18 0045 07/06/18 0100 07/06/18 0115 07/06/18 0130  BP: 138/68 132/82 (!) 127/37 (!) 128/57  Pulse: (!) 53 (!) 44 (!) 59 (!) 50  Resp: _0 Temp:      TempSrc:      SpO2: 95% 100% 94% 97%  Weight:      Height:      PainSc:        Isolation Precautions No active isolations  Medications Medications  sodium chloride 0.9 % bolus 500 mL (0 mLs Intravenous Stopped 07/06/18 0145)  acetaminophen (TYLENOL) tablet 650 mg (650 mg Oral Given 07/06/18 0033)    Mobility walks

## 2018-07-06 NOTE — Progress Notes (Addendum)
24 hr urine started at 2010. Pt educated on collection process and verbalizes understanding.

## 2018-07-06 NOTE — Plan of Care (Signed)
  Problem: Education: Goal: Knowledge of General Education information will improve Outcome: Progressing   Problem: Health Behavior/Discharge Planning: Goal: Ability to manage health-related needs will improve Outcome: Progressing   Problem: Clinical Measurements: Goal: Ability to maintain clinical measurements within normal limits will improve Outcome: Progressing Goal: Will remain free from infection Outcome: Progressing Goal: Diagnostic test results will improve Outcome: Progressing Goal: Respiratory complications will improve Outcome: Progressing Goal: Cardiovascular complication will be avoided Outcome: Progressing   Problem: Activity: Goal: Risk for activity intolerance will decrease Outcome: Progressing   Problem: Nutrition: Goal: Adequate nutrition will be maintained Outcome: Progressing   Problem: Elimination: Goal: Will not experience complications related to urinary retention Outcome: Progressing   Problem: Pain Managment: Goal: General experience of comfort will improve Outcome: Progressing   Problem: Safety: Goal: Ability to remain free from injury will improve Outcome: Progressing   Problem: Skin Integrity: Goal: Risk for impaired skin integrity will decrease Outcome: Progressing

## 2018-07-06 NOTE — Progress Notes (Signed)
[facility] Progress Note  Patient: Donna Baldwin TFT:[id number]   PCP: Harlan Stains, MD DOB: 1941-11-15   DOA: 07/05/2018   DOS: 07/06/2018   Date of Service: the patient was seen and examined on 07/06/2018  Subjective: feeling better, headache has resolved. Later on pt had an episode of chills, was shaking and her BP went up. It resolved with medicine.   Brief hospital course: Pt. with PMH of HTN, HLD, anemia; admitted on 07/05/2018, presented with complaint of high blood pressure, was found to have hypertensive urgency Currently further plan is continue blood pressure control.  Assessment and Plan: 1. Accelerated hypertension  Pt report that she has been having high blood pressure at home  Has been checking it daily since last 1 week She has been taking extra medicine for blood pressure without any benefit.  She reports that she has been taking her medicine regularly Denies taking any herbal supplements  Denies taking any cold-sinus medicine at home Told me that she has work up done in clinic for 24 hour urine collection 3-4 months agio She has been on aldactone since 1 year At present  norvasc dose increased Hydralazine added scheduled and PRN May need to add nitrates  Unable to tolerate ARBs.  Bradycardic so holding beta blockier Checking cortisol level in AM, tsh normal.  Also checking US renal dopplers Appears that pheochromocytoma work-up has already been performed outpatient.  2.  Headache. Initially was unable to do finger-nose-finger. Currently resolved and back to normal. CT as well as MRI brain negative for any acute stroke. Continue aspirin. Monitor.  3.  GERD. Continue PPI.  4.  Anxiety. Patient appears to have significant component of anxiety based on exam. May benefit from addition of BuSpar. Monitor.  Diet: low salth diet DVT Prophylaxis: subcutaneous Heparin  Advance goals of care discussion: full code  Family Communication: family was  present at bedside, at the time of interview. noThe pt provided permission to discuss medical plan with the family. Opportunity was given to ask question and all questions were answered satisfactorily.   Disposition:  Discharge to home.  Consultants: none Procedures: Echocardiogram   Antibiotics: Anti-infectives (From admission, onward)   None       Objective: Physical Exam: Vitals:   07/06/18 1020 07/06/18 1241 07/06/18 1300 07/06/18 1445  BP: (!) 164/71 (!) 211/84  136/69  Pulse:  (!) 52 60 (!) 51  Resp:      Temp:      TempSrc:      SpO2:      Weight:      Height:        Intake/Output Summary (Last 24 hours) at 07/06/2018 1700 Last data filed at 07/06/2018 0145 Gross per 24 hour  Intake 500 ml  Output -  Net 500 ml   Filed Weights   07/05/18 2339  Weight: 61.2 kg (135 lb)   General: Alert, Awake and Oriented to Time, Place and Person. Appear in moderate distress, affect appropriate Eyes: PERRL, Conjunctiva normal ENT: Oral Mucosa clear moist. Neck: no JVD, no Abnormal Mass Or lumps Cardiovascular: S1 and S2 Present, no Murmur, Peripheral Pulses Present Respiratory: normal respiratory effort, Bilateral Air entry equal and Decreased, no use of accessory muscle, Clear to Auscultation, no Crackles, no wheezes Abdomen: Bowel Sound present, Soft and no tenderness, no hernia Skin: no redness, no Rash, no induration Extremities: no Pedal edema, no calf tenderness Neurologic: Grossly no focal neuro deficit. Bilaterally Equal motor strength  Data Reviewed: CBC: Recent  Labs  Lab 07/05/18 2339 07/06/18 0454  WBC 3.7* 3.9*  NEUTROABS 2.0  --   HGB 12.7 12.1  HCT 40.7 38.4  MCV 73.3* 73.4*  PLT 150 500*   Basic Metabolic Panel: Recent Labs  Lab 07/05/18 2339 07/06/18 0454  NA 142 143  K 3.6 4.2  CL 106 110  CO2 26 26  GLUCOSE 113* 105*  BUN 13 13  CREATININE 1.05* 0.87  CALCIUM 9.6 9.0    Liver Function Tests: Recent Labs  Lab 07/06/18 0454  AST 21    ALT 12  ALKPHOS 81  BILITOT 0.5  PROT 6.3*  ALBUMIN 3.7   No results for input(s): LIPASE, AMYLASE in the last 168 hours. No results for input(s): AMMONIA in the last 168 hours. Coagulation Profile: No results for input(s): INR, PROTIME in the last 168 hours. Cardiac Enzymes: No results for input(s): CKTOTAL, CKMB, CKMBINDEX, TROPONINI in the last 168 hours. BNP (last 3 results) No results for input(s): PROBNP in the last 8760 hours. CBG: No results for input(s): GLUCAP in the last 168 hours. Studies: Ct Head Wo Contrast  Result Date: 07/06/2018 CLINICAL DATA:  Acute headache with normal neuro exam. Uncontrolled blood pressure for 2 days EXAM: CT HEAD WITHOUT CONTRAST TECHNIQUE: Contiguous axial images were obtained from the base of the skull through the vertex without intravenous contrast. COMPARISON:  01/10/2018 FINDINGS: Brain: No evidence of acute infarction, hemorrhage, hydrocephalus, extra-axial collection or mass lesion/mass effect. Subtle low-density in the cerebral white matter correlating with mild chronic small vessel ischemia on a 2016 brain MRI. Vascular: No hyperdense vessel or unexpected calcification. Skull: Normal. Negative for fracture or focal lesion. Sinuses/Orbits: Left cataract resection. IMPRESSION: No acute finding or explanation for headache. Electronically Signed   By: Monte Fantasia M.D.   On: 07/06/2018 00:07   Mr Brain Wo Contrast  Result Date: 07/06/2018 CLINICAL DATA:  Uncontrolled blood pressure for 2 days.  Headache. EXAM: MRI HEAD WITHOUT CONTRAST TECHNIQUE: Multiplanar, multiecho pulse sequences of the brain and surrounding structures were obtained without intravenous contrast. COMPARISON:  None. FINDINGS: Brain: No evidence for acute infarction, hemorrhage, mass lesion, hydrocephalus, or extra-axial fluid. Normal for age cerebral volume. Mild subcortical and periventricular T2 and FLAIR hyperintensities, likely chronic microvascular ischemic change. No  features suggestive of PRES/hypertensive encephalopathy. No microbleeds on gradient imaging. Vascular: Flow voids are maintained in the carotid, basilar, and vertebral arteries. Skull and upper cervical spine: Normal marrow signal. No tonsillar herniation. Sinuses/Orbits: Paranasal sinuses are clear. LEFT cataract extraction. Other: No mastoid fluid. IMPRESSION: Normal for age cerebral volume with mild small vessel disease. No acute intracranial findings. No imaging features suggestive of PRES/hypertensive encephalopathy. Electronically Signed   By: Staci Righter M.D.   On: 07/06/2018 08:50    Scheduled Meds: . amLODipine  5 mg Oral BID  . busPIRone  5 mg Oral BID  . enoxaparin (LOVENOX) injection  40 mg Subcutaneous Q24H  . hydrALAZINE  50 mg Oral Q8H  . loratadine  10 mg Oral Daily  . sodium chloride flush  3 mL Intravenous Q12H  . spironolactone  12.5 mg Oral Daily   Continuous Infusions: . sodium chloride     PRN Meds: sodium chloride, acetaminophen **OR** acetaminophen, aspirin EC, docusate sodium, hydrALAZINE, LORazepam, sodium chloride flush  Time spent: 35 minutes  Author: Berle Mull, MD [facility] Pager: [phone] 07/06/2018 5:00 PM  If 7PM-7AM, please contact night-coverage at [email or web address], password [facility]

## 2018-07-06 NOTE — Progress Notes (Signed)
Nutrition Education Note  RD consulted for nutrition education regarding low sodium  RD provided "Low Sodium Nutrition Therapy" handout from the Academy of [employer]etics. Reviewed patient's dietary recall. Provided examples on ways to decrease sodium intake in diet. Discouraged intake of processed foods and use of salt shaker. Encouraged fresh fruits and vegetables as well as whole grain sources of carbohydrates to maximize fiber intake.   RD discussed why it is important for patient to adhere to diet recommendations. Teach back method used.  Pt concerned about sodium intake with hypertension. Discussed being mindful of products that contain salt and to limit salt shaker if possible. Answered all pt questions.   Expect good compliance.  Body mass index is 23.91 kg/m. Pt meets criteria for normal based on current BMI.  Current diet order is heart healthy. Labs and medications reviewed. No further nutrition interventions warranted at this time. RD contact information provided. If additional nutrition issues arise, please re-consult RD.   Mariana Single RD, LDN Clinical Nutrition Pager # [phone]

## 2018-07-06 NOTE — Progress Notes (Signed)
  Echocardiogram 2D Echocardiogram has been performed.  Merrie Roof F 07/06/2018, 1:55 PM

## 2018-07-06 NOTE — Progress Notes (Signed)
Called to patient's room by patient. She was sitting on the side of the bed, leaning over. She stated that she felt weak, faint and she felt her heart racing. Her heart rate was 120's at the time, her BP was 168/90. MD Posey Pronto made aware. She was able to get back into bed with assistance. During the episode she started to shake uncontrolably.  An ekg was obtained and the patient was given PO ativan per MD order.

## 2018-07-06 NOTE — Progress Notes (Signed)
Patient elevated 211/84. MD Posey Pronto made aware, new orders received. Patient complains of a headache, is shaking and anxious. Will continue to monitor closely.

## 2018-07-06 NOTE — H&P (Signed)
TR[facility] H&P   Patient Demographics:    Donna Baldwin, is a 77 y.o. female  MRN: [id number]   DOB - [birth year]  Admit Date - 07/05/2018  Outpatient Primary MD for the patient is Harlan Stains, MD  Referring MD/NP/PA:   [facility]:   Patient coming from: home  Chief Complaint  Patient presents with  . Hypertension      HPI:    Donna Baldwin  is a 77 y.o. female, w hypertension, hyperlipidemia, anemia, thrombocytopenia, vitamin D def, presents with c/o uncontrolled hypertension at home and then apparently took double her medication and bp was still high , and thus presented to Ed. In the ED , bp initially high but has improved without intervention  In Ed, per ED, pt can't do finger to nose on exam  CT brain IMPRESSION: No acute finding or explanation for headache.  Wbc 3.7, hgb 12.7, Plt 150 mcv 73.3  Na 142, K3.6,  Bun 13, Creatinine 1.05  Pt will be admitted for uncontrolled hypertension , hypertensive urgency and r/o CVA    Review of systems:    In addition to the HPI above,  No Fever-chills, No changes with Vision or hearing, No problems swallowing food or Liquids, No Chest pain, Cough or Shortness of Breath, No Abdominal pain, No Nausea or Vommitting, Bowel movements are regular, No Blood in stool or Urine, No dysuria, No new skin rashes or bruises, No new joints pains-aches,  No new weakness, tingling, numbness in any extremity, No recent weight gain or loss, No polyuria, polydypsia or polyphagia, No significant Mental Stressors.  A full 10 point Review of Systems was done, except as stated above, all other Review of Systems were negative.   With Past History of the following :    Past Medical History:  Diagnosis Date  . Anemia   . Anxiety   . Atherosclerosis   . Back pain   . Coronary artery disease   .  GERD (gastroesophageal reflux disease)   . HTN (hypertension) 12/03/2013   CT scan-no pheochromocytoma  . Hyperlipidemia   . Hypertension   . Hypertensive heart disease without heart failure   . Thrombocytopenia ([place])   . Vitamin D deficiency       Past Surgical History:  Procedure Laterality Date  . EYE SURGERY    . SHOULDER SURGERY Bilateral   . TONSILLECTOMY        Social History:     Social History   Tobacco Use  . Smoking status: Never Smoker  . Smokeless tobacco: Never Used  Substance Use Topics  . Alcohol use: No    Alcohol/week: 0.0 oz     Lives - at home  Mobility - walks by self   Family History :     Family History  Problem Relation Age of Onset  . Cancer Father     Home  Medications:   Prior to Admission medications   Medication Sig Start Date End Date Taking? Authorizing Provider  amLODipine (NORVASC) 2.5 MG tablet Take 3 tablets (7.5 mg total) by mouth daily. Patient taking differently: Take 2.5-5 mg by mouth See admin instructions. 2.5 mg by mouth in the morning and 5 mg at bedtime 01/06/18 07/05/18 Yes Dorie Rank, MD  aspirin 81 MG tablet Take 81 mg by mouth daily as needed (for pain and pressure).    Yes [provider]  BYSTOLIC 5 MG tablet Take 10 mg by mouth daily.  06/22/18  Yes [provider]  cetirizine (ZYRTEC) 10 MG tablet Take 10 mg by mouth daily as needed for allergies.    Yes [provider]  cimetidine (TAGAMET) 400 MG tablet Take 400 mg by mouth 2 (two) times daily. 04/02/18  Yes [provider]  docusate sodium (COLACE) 100 MG capsule Take 100 mg by mouth daily as needed for mild constipation.   Yes [provider]  EPINEPHrine 0.3 mg/0.3 mL IJ SOAJ injection Inject 0.3 mg into the muscle once as needed (for anaphylaxis).  05/31/16  Yes [provider]  LORazepam (ATIVAN) 1 MG tablet Take 0.5 tablets (0.5 mg total) by mouth every 8 (eight) hours as needed for anxiety. 07/29/15  Yes Jola Schmidt, MD  Magnesium Hydroxide (MILK OF MAGNESIA PO) Take 15 mLs by mouth daily as needed (CONSTIPATION).   Yes [provider]  NON FORMULARY [employer] Warm Vanilla Body Butter: Apply to dry skin daily   Yes [provider]  spironolactone (ALDACTONE) 5 mg/mL SUSP oral suspension Take 5 mg/kg by mouth daily.   Yes [provider]  albuterol (PROVENTIL HFA;VENTOLIN HFA) 108 (90 Base) MCG/ACT inhaler Inhale 2 puffs into the lungs every 4 (four) hours as needed for wheezing or shortness of breath. Patient not taking: Reported on 07/05/2018 03/31/16   Konrad Felix, PA  famotidine (PEPCID) 20 MG tablet Take 1 tablet (20 mg total) by mouth 2 (two) times daily. Patient not taking: Reported on 01/09/2018 07/16/16   Dorie Rank, MD  spironolactone (ALDACTONE) 25 MG tablet Take 12.5 mg by mouth daily. 06/29/18   [provider]     Allergies:     Allergies  Allergen Reactions  . Acetaminophen Other (See Comments)    Tremor, anxiety  . Beef-Derived Products Anaphylaxis  . Codeine Hives and Anxiety  . Epinephrine Palpitations  . Latex Rash  . Morphine And Related Other (See Comments)    Tremors, increased heart rate, excitement, confusion per pt  . Penicillins Hives    Has patient had a PCN reaction causing immediate rash, facial/tongue/throat swelling, SOB or lightheadedness with hypotension: Yes Has patient had a PCN reaction causing severe rash involving mucus membranes or skin necrosis: Yes Has patient had a PCN reaction that required hospitalization: Yes Has patient had a PCN reaction occurring within the last 10 years: No If all of the above answers are "NO", then may proceed with Cephalosporin use.   . Sertraline Other (See Comments)    Numbness in mouth and hyperactivity  . Shellfish Allergy Hives  . Solu-Medrol [Methylprednisolone] Other (See Comments)    Numbness and tingling in mouth  . Sulfa Antibiotics Hives and Rash  . Barbiturates Other (See  Comments)    Excitement   . Bee Pollen Hives  . Bee Venom Hives  . Diovan [Valsartan] Nausea Only  . Gabapentin Other (See Comments)    Visual disturbance  .  Ivp Dye [Iodinated Diagnostic Agents] Other (See Comments)    ANAPHYLACTIC  . Livalo [Pitavastatin] Swelling and Other (See Comments)    Facial swelling  . Nutritional Supplements Other (See Comments)    Nuts, strawberries, bicarbonate of soda,  . Other Palpitations and Other (See Comments)    NARCOTIC ANALGESICS-TREMORS, INCREASED HEART RATE Hmg-Coa Reductase Inhibitors - intolerance unknown   . Oxycodone Swelling, Rash and Cough  . Promethazine-Dm Other (See Comments)    "Felt funny"  . Tape Rash and Other (See Comments)    Red blotches Red blotches  . Albuterol Other (See Comments)    Caused wheezing  . Pred Forte [Prednisolone Acetate] Other (See Comments)    Patient is seeing odd color and images while using this  . Celebrex [Celecoxib] Swelling and Other (See Comments)    Swelling and numbness (mouth)  . Lanolin Itching  . Mobic [Meloxicam] Swelling and Other (See Comments)    Swelling and numbness in mouth  . Nitrates, Organic Rash and Other (See Comments)    Chest tightness also  . Petroleum Jelly [Petrolatum] Rash  . Soy Allergy Rash  . Strawberry Extract Rash  . Tylox [Oxycodone-Acetaminophen] Swelling, Rash and Cough     Physical Exam:   Vitals  Blood pressure (!) 128/57, pulse (!) 50, temperature 97.9 F (36.6 C), temperature source Oral, resp. rate 16, height 5\' 3"  (1.6 m), weight 61.2 kg (135 lb), SpO2 97 %.   1. General  lying in bed in NAD,   2. Normal affect and insight, Not Suicidal or Homicidal, Awake Alert, Oriented X 3.  3. No F.N deficits, ALL C.Nerves Intact, Strength 5/5 all 4 extremities, Sensation intact all 4 extremities, Plantars down going.  4. Ears and Eyes appear Normal, Conjunctivae clear, PERRLA. Moist Oral Mucosa.  5. Supple Neck, No JVD, No cervical lymphadenopathy  appriciated, No Carotid Bruits.  6. Symmetrical Chest wall movement, Good air movement bilaterally, CTAB.  7. RRR, No Gallops, Rubs or Murmurs, No Parasternal Heave.  8. Positive Bowel Sounds, Abdomen Soft, No tenderness, No organomegaly appriciated,No rebound -guarding or rigidity.  9.  No Cyanosis, Normal Skin Turgor, No Skin Rash or Bruise.  10. Good muscle tone,  joints appear normal , no effusions, Normal ROM.  11. No Palpable Lymph Nodes in Neck or Axillae  Able to walk,  Unable to do finger to nose properly but pt admits to poor vision      Data Review:    CBC Recent Labs  Lab 07/05/18 2339  WBC 3.7*  HGB 12.7  HCT 40.7  PLT 150  MCV 73.3*  MCH 22.9*  MCHC 31.2  RDW 14.9  LYMPHSABS 1.3  MONOABS 0.4  EOSABS 0.0  BASOSABS 0.0   ------------------------------------------------------------------------------------------------------------------  Chemistries  Recent Labs  Lab 07/05/18 2339  NA 142  K 3.6  CL 106  CO2 26  GLUCOSE 113*  BUN 13  CREATININE 1.05*  CALCIUM 9.6   ------------------------------------------------------------------------------------------------------------------ estimated creatinine clearance is 37.7 mL/min (A) (by C-G formula based on SCr of 1.05 mg/dL (H)). ------------------------------------------------------------------------------------------------------------------ No results for input(s): TSH, T4TOTAL, T3FREE, THYROIDAB in the last 72 hours.  Invalid input(s): FREET3  Coagulation profile No results for input(s): INR, PROTIME in the last 168 hours. ------------------------------------------------------------------------------------------------------------------- No results for input(s): DDIMER in the last 72 hours. -------------------------------------------------------------------------------------------------------------------  Cardiac Enzymes No results for input(s): CKMB, TROPONINI, MYOGLOBIN in the last 168  hours.  Invalid input(s): CK ------------------------------------------------------------------------------------------------------------------ No results found for: BNP   ---------------------------------------------------------------------------------------------------------------  Urinalysis  Component Value Date/Time   COLORURINE YELLOW 04/03/2017 1059   APPEARANCEUR CLEAR 04/03/2017 1059   LABSPEC 1.005 04/03/2017 1059   PHURINE 6.0 04/03/2017 1059   GLUCOSEU NEGATIVE 04/03/2017 1059   HGBUR NEGATIVE 04/03/2017 1059   BILIRUBINUR NEGATIVE 04/03/2017 1059   KETONESUR NEGATIVE 04/03/2017 1059   PROTEINUR NEGATIVE 04/03/2017 1059   NITRITE NEGATIVE 04/03/2017 1059   LEUKOCYTESUR NEGATIVE 04/03/2017 1059    ----------------------------------------------------------------------------------------------------------------   Imaging Results:    Ct Head Wo Contrast  Result Date: 07/06/2018 CLINICAL DATA:  Acute headache with normal neuro exam. Uncontrolled blood pressure for 2 days EXAM: CT HEAD WITHOUT CONTRAST TECHNIQUE: Contiguous axial images were obtained from the base of the skull through the vertex without intravenous contrast. COMPARISON:  01/10/2018 FINDINGS: Brain: No evidence of acute infarction, hemorrhage, hydrocephalus, extra-axial collection or mass lesion/mass effect. Subtle low-density in the cerebral white matter correlating with mild chronic small vessel ischemia on a 2016 brain MRI. Vascular: No hyperdense vessel or unexpected calcification. Skull: Normal. Negative for fracture or focal lesion. Sinuses/Orbits: Left cataract resection. IMPRESSION: No acute finding or explanation for headache. Electronically Signed   By: Monte Fantasia M.D.   On: 07/06/2018 00:07       Assessment & Plan:    Active Problems:   TIA (transient ischemic attack)    Headache w focal neurological deficit (per ED, unable to do finger to nose) Hypertensive urgency MRI brain without  contrast r/o CVA  Hypertension uncontrolled.  Increase Amlodipine to 5mg  po bid Cont Bystolic 5mg  po qday Cont Spironolactone 25mg  po qday Start Hydralazine 25mg  po tid  Gerd pepcid 20mg  po bid   DVT Prophylaxis Lovenox - SCDs   AM Labs Ordered, also please review Full Orders  Family Communication: Admission, patients condition and plan of care including tests being ordered have been discussed with the patient  who indicate understanding and agree with the plan and Code Status.  Code Status  FULL CODE  Likely DC to  home  Condition GUARDED   Consults called: none  Admission status: observation   Time spent in minutes : 60   Jani Gravel M.D on 07/06/2018 at 2:03 AM  Between 7am to 7pm - Pager - [phone]  . After 7pm go to [email or web address] - password [facility]  [facility] - Office  [phone]

## 2018-07-07 DIAGNOSIS — I160 Hypertensive urgency: Secondary | ICD-10-CM

## 2018-07-07 LAB — FERRITIN: FERRITIN: 89 ng/mL (ref 11–307)

## 2018-07-07 LAB — BASIC METABOLIC PANEL
Anion gap: 8 (ref 5–15)
BUN: 12 mg/dL (ref 8–23)
CO2: 27 mmol/L (ref 22–32)
CREATININE: 0.8 mg/dL (ref 0.44–1.00)
Calcium: 9.6 mg/dL (ref 8.9–10.3)
Chloride: 109 mmol/L (ref 98–111)
GFR calc Af Amer: >60 mL/min (ref >60)
Glucose, Bld: 98 mg/dL (ref 70–99)
POTASSIUM: 4.1 mmol/L (ref 3.5–5.1)
SODIUM: 144 mmol/L (ref 135–145)

## 2018-07-07 LAB — IRON AND TIBC
IRON: 101 ug/dL (ref 28–170)
SATURATION RATIOS: 30 % (ref 10.4–31.8)
TIBC: 339 ug/dL (ref 250–450)
UIBC: 238 ug/dL

## 2018-07-07 LAB — CORTISOL-AM, BLOOD: Cortisol - AM: 6.8 ug/dL (ref 6.7–22.6)

## 2018-07-07 MED ORDER — HYDRALAZINE HCL 50 MG PO TABS: 50.0000 mg | ORAL_TABLET | Freq: Three times a day (TID) | ORAL | 0 refills | Status: DC

## 2018-07-07 MED ORDER — BUSPIRONE HCL 5 MG PO TABS: 5.0000 mg | ORAL_TABLET | Freq: Two times a day (BID) | ORAL | 0 refills | Status: DC

## 2018-07-07 MED ORDER — AMLODIPINE BESYLATE 5 MG PO TABS: 5.0000 mg | ORAL_TABLET | Freq: Two times a day (BID) | ORAL | 0 refills | Status: DC

## 2018-07-07 NOTE — Discharge Instructions (Signed)
Managing Your Hypertension What changes can I make to manage my hypertension? Hypertension can be managed by making lifestyle changes and possibly by taking medicines. Your health care provider will help you make a plan to bring your blood pressure within a normal range. Eating and drinking  Eat a diet that is high in fiber and potassium, and low in salt (sodium), added sugar, and fat. An example eating plan is called the DASH (Dietary Approaches to Stop Hypertension) diet. To eat this way: ? Eat plenty of fresh fruits and vegetables. Try to fill half of your plate at each meal with fruits and vegetables. ? Eat whole grains, such as whole wheat pasta, brown rice, or whole grain bread. Fill about one quarter of your plate with whole grains. ? Eat low-fat diary products. ? Avoid fatty cuts of meat, processed or cured meats, and poultry with skin. Fill about one quarter of your plate with lean proteins such as fish, chicken without skin, beans, eggs, and tofu. ? Avoid premade and processed foods. These tend to be higher in sodium, added sugar, and fat.  Reduce your daily sodium intake. Most people with hypertension should eat less than 1,500 mg of sodium a day.  Limit alcohol intake to no more than 1 drink a day for nonpregnant women and 2 drinks a day for men. One drink equals 12 oz of beer, 5 oz of wine, or 1 oz of hard liquor. Lifestyle  Work with your health care provider to maintain a healthy body weight, or to lose weight. Ask what an ideal weight is for you.  Get at least 30 minutes of exercise that causes your heart to beat faster (aerobic exercise) most days of the week. Activities may include walking, swimming, or biking.  Include exercise to strengthen your muscles (resistance exercise), such as weight lifting, as part of your weekly exercise routine. Try to do these types of exercises for 30 minutes at least 3 days a week.  Do not use any products that contain nicotine or tobacco,  such as cigarettes and e-cigarettes. If you need help quitting, ask your health care provider.  Control any long-term (chronic) conditions you have, such as high cholesterol or diabetes. Monitoring  Monitor your blood pressure at home as told by your health care provider. Your personal target blood pressure may vary depending on your medical conditions, your age, and other factors.  Working with your health care provider  Review all the medicines you take with your health care provider because there may be side effects or interactions.  Talk with your health care provider about your diet, exercise habits, and other lifestyle factors that may be contributing to hypertension.  Visit your health care provider regularly. Your health care provider can help you create and adjust your plan for managing hypertension.  Take medicines only as told by your health care provider. Follow the directions carefully. Blood pressure medicines must be taken as prescribed. The medicine does not work as well when you skip doses. Skipping doses also puts you at risk for problems. Contact a health care provider if:  You think you are having a reaction to medicines you have taken.  You have repeated (recurrent) headaches.  You feel dizzy.  You have swelling in your ankles.  You have trouble with your vision. Get help right away if:  You develop a severe headache or confusion.  You have unusual weakness or numbness, or you feel faint.  You have severe pain in your chest  or abdomen.  You vomit repeatedly.  You have trouble breathing. Summary  Hypertension is when the force of blood pumping through your arteries is too strong. If this condition is not controlled, it may put you at risk for serious complications.  Your personal target blood pressure may vary depending on your medical conditions, your age, and other factors.  Hypertension is managed by lifestyle changes, medicines, or both. Lifestyle  changes include weight loss, eating a healthy, low-sodium diet, exercising more, and limiting alcohol. This information is not intended to replace advice given to you by your health care provider. Make sure you discuss any questions you have with your health care provider. Document Released: 09/09/2012 Document Revised: 11/13/2016 Document Reviewed: 11/13/2016 Elsevier Interactive Patient Education  [employer].

## 2018-07-07 NOTE — Progress Notes (Signed)
    Ms. Dangler is seen by Dr. Wynonia Lawman as OP BP is well controlled . Discussed with Dr. Posey Pronto. No need to see her at this point. She will follow up with Dr. Wynonia Lawman.    Mertie Moores, MD  07/07/2018 8:20 AM    [place],  [address], [place]  [address] Pager [phone] Phone: [phone]; Fax: [phone]

## 2018-07-07 NOTE — Discharge Summary (Signed)
[facility] Discharge Summary   Patient: Donna Baldwin MBT:[id number]   PCP: Harlan Stains, MD DOB: [birth year]   Date of admission: 07/05/2018   Date of discharge:  07/07/2018    Discharge Diagnoses:  Principal Problem:   Hypertensive urgency   Admitted From: home Disposition:  home  Recommendations for Outpatient Follow-up:  1. Please follow-up with PCP in 1 week 2. Patient recommended not to check her blood pressure and actually call her provider before checking her blood pressure when she is not feeling good.  Follow-up Information    Harlan Stains, MD. Schedule an appointment as soon as possible for a visit in 1 week(s).   Specialty:  Family Medicine Why:  1) discuss about resuming bystolic or not 2) discuss about renal ultrasound  3) discuss about long term anxiety medicine.  Contact information: [address], [address] [place] [address] [phone]          Diet recommendation: cardiac diet  Activity: The patient is advised to gradually reintroduce usual activities.  Discharge Condition: good  Code Status: full code  History of present illness: As per the H and P dictated on admission, " Donna Baldwin  is a 77 y.o. female, w hypertension, hyperlipidemia, anemia, thrombocytopenia, vitamin D def, presents with c/o uncontrolled hypertension at home and then apparently took double her medication and bp was still high , and thus presented to Ed. In the ED , bp initially high but has improved without intervention."  Hospital Course:  Summary of her active problems in the hospital is as following. 1. Accelerated hypertension  Pt report that she has been having high blood pressure at home  Has been checking it multiple times daily since last 1 week. She has been taking extra medicine for blood pressure without any benefit. She reports that she has been taking her medicine regularly Denies taking any herbal supplements. Denies taking any  cold-sinus medicine at home Told me that she has work up done in clinic for 24 hour urine collection 3-4 months ago(in fact I talked to her PCP and the work-up was done 3 to 4 years ago, work-up was negative.) She has been on aldactone since 1 year At present  norvasc dose increased, from 7.5 mg daily to 5 mg twice daily Hydralazine added scheduled 50 mg 3 times daily May need to add nitrates  Unable to tolerate ARBs.  Bradycardic so holding beta blockier, can follow-up with PCP and probably start back on lower dose 2.5 mg Bystolic. Low normal 6.8 cortisol level in AM, tsh normal.  Was to perform US renal Doppler although no availability at [facility].  Recommend patient to follow-up with PCP and get this work-up done outpatient. That there is a large component of anxiety playing a role here in patient's blood pressure. Recommended patient not to check her blood pressure on a daily basis especially not multiple times during the day.  2.  Headache. Initially was unable to do finger-nose-finger. Currently resolved and back to normal. CT as well as MRI brain negative for any acute stroke. Continue aspirin. Monitor.  3.  GERD. Continue PPI.  4.  Anxiety. Patient appears to have significant component of anxiety based on exam. May benefit from addition of BuSpar. Monitor.  All other chronic medical condition were stable during the hospitalization.  Patient was ambulatory without any assistance. On the day of the discharge the patient's vitals were stable , and no other acute medical condition were reported by patient. the  patient was felt safe to be discharge at home with family.  Consultants: none-brief discussion with cardiology. Procedures: Echocardiogram   DISCHARGE MEDICATION: Allergies as of 07/07/2018      Reactions   Acetaminophen Other (See Comments)   Tremor, anxiety   Beef-derived Products Anaphylaxis   Codeine Hives, Anxiety   Epinephrine Palpitations   Latex Rash     Morphine And Related Other (See Comments)   Tremors, increased heart rate, excitement, confusion per pt   Penicillins Hives   Has patient had a PCN reaction causing immediate rash, facial/tongue/throat swelling, SOB or lightheadedness with hypotension: Yes Has patient had a PCN reaction causing severe rash involving mucus membranes or skin necrosis: Yes Has patient had a PCN reaction that required hospitalization: Yes Has patient had a PCN reaction occurring within the last 10 years: No If all of the above answers are "NO", then may proceed with Cephalosporin use.   Sertraline Other (See Comments)   Numbness in mouth and hyperactivity   Shellfish Allergy Hives   Solu-medrol [methylprednisolone] Other (See Comments)   Numbness and tingling in mouth   Sulfa Antibiotics Hives, Rash   Barbiturates Other (See Comments)   Excitement   Bee Pollen Hives   Bee Venom Hives   Diovan [valsartan] Nausea Only   Gabapentin Other (See Comments)   Visual disturbance   Ivp Dye [iodinated Diagnostic Agents] Other (See Comments)   ANAPHYLACTIC   Livalo [pitavastatin] Swelling, Other (See Comments)   Facial swelling   Nutritional Supplements Other (See Comments)   Nuts, strawberries, bicarbonate of soda,   Other Palpitations, Other (See Comments)   NARCOTIC ANALGESICS-TREMORS, INCREASED HEART RATE Hmg-Coa Reductase Inhibitors - intolerance unknown   Oxycodone Swelling, Rash, Cough   Promethazine-dm Other (See Comments)   "Felt funny"   Tape Rash, Other (See Comments)   Red blotches Red blotches   Albuterol Other (See Comments)   Caused wheezing   Pred Forte [prednisolone Acetate] Other (See Comments)   Patient is seeing odd color and images while using this   Celebrex [celecoxib] Swelling, Other (See Comments)   Swelling and numbness (mouth)   Lanolin Itching   Mobic [meloxicam] Swelling, Other (See Comments)   Swelling and numbness in mouth   Nitrates, Organic Rash, Other (See Comments)    Chest tightness also   Petroleum Jelly [petrolatum] Rash   Soy Allergy Rash   Strawberry Extract Rash   Tylox [oxycodone-acetaminophen] Swelling, Rash, Cough      Medication List    STOP taking these medications   BYSTOLIC 5 MG tablet Generic drug:  nebivolol   cimetidine 400 MG tablet Commonly known as:  TAGAMET   famotidine 20 MG tablet Commonly known as:  PEPCID   NON FORMULARY     TAKE these medications   albuterol 108 (90 Base) MCG/ACT inhaler Commonly known as:  PROVENTIL HFA;VENTOLIN HFA Inhale 2 puffs into the lungs every 4 (four) hours as needed for wheezing or shortness of breath.   amLODipine 5 MG tablet Commonly known as:  NORVASC Take 1 tablet (5 mg total) by mouth 2 (two) times daily. What changed:    medication strength  how much to take  when to take this   aspirin 81 MG tablet Take 81 mg by mouth daily as needed (for pain and pressure).   busPIRone 5 MG tablet Commonly known as:  BUSPAR Take 1 tablet (5 mg total) by mouth 2 (two) times daily.   cetirizine 10 MG tablet Commonly known as:  ZYRTEC Take 10 mg by mouth daily as needed for allergies.   docusate sodium 100 MG capsule Commonly known as:  COLACE Take 100 mg by mouth daily as needed for mild constipation.   EPINEPHrine 0.3 mg/0.3 mL Soaj injection Commonly known as:  EPI-PEN Inject 0.3 mg into the muscle once as needed (for anaphylaxis).   hydrALAZINE 50 MG tablet Commonly known as:  APRESOLINE Take 1 tablet (50 mg total) by mouth every 8 (eight) hours.   LORazepam 1 MG tablet Commonly known as:  ATIVAN Take 0.5 tablets (0.5 mg total) by mouth every 8 (eight) hours as needed for anxiety.   MILK OF MAGNESIA PO Take 15 mLs by mouth daily as needed (CONSTIPATION).   spironolactone 5 mg/mL Susp oral suspension Commonly known as:  ALDACTONE Take 5 mg/kg by mouth daily.   spironolactone 25 MG tablet Commonly known as:  ALDACTONE Take 12.5 mg by mouth daily.       Allergies  Allergen Reactions  . Acetaminophen Other (See Comments)    Tremor, anxiety  . Beef-Derived Products Anaphylaxis  . Codeine Hives and Anxiety  . Epinephrine Palpitations  . Latex Rash  . Morphine And Related Other (See Comments)    Tremors, increased heart rate, excitement, confusion per pt  . Penicillins Hives    Has patient had a PCN reaction causing immediate rash, facial/tongue/throat swelling, SOB or lightheadedness with hypotension: Yes Has patient had a PCN reaction causing severe rash involving mucus membranes or skin necrosis: Yes Has patient had a PCN reaction that required hospitalization: Yes Has patient had a PCN reaction occurring within the last 10 years: No If all of the above answers are "NO", then may proceed with Cephalosporin use.   . Sertraline Other (See Comments)    Numbness in mouth and hyperactivity  . Shellfish Allergy Hives  . Solu-Medrol [Methylprednisolone] Other (See Comments)    Numbness and tingling in mouth  . Sulfa Antibiotics Hives and Rash  . Barbiturates Other (See Comments)    Excitement   . Bee Pollen Hives  . Bee Venom Hives  . Diovan [Valsartan] Nausea Only  . Gabapentin Other (See Comments)    Visual disturbance  . Ivp Dye [Iodinated Diagnostic Agents] Other (See Comments)    ANAPHYLACTIC  . Livalo [Pitavastatin] Swelling and Other (See Comments)    Facial swelling  . Nutritional Supplements Other (See Comments)    Nuts, strawberries, bicarbonate of soda,  . Other Palpitations and Other (See Comments)    NARCOTIC ANALGESICS-TREMORS, INCREASED HEART RATE Hmg-Coa Reductase Inhibitors - intolerance unknown   . Oxycodone Swelling, Rash and Cough  . Promethazine-Dm Other (See Comments)    "Felt funny"  . Tape Rash and Other (See Comments)    Red blotches Red blotches  . Albuterol Other (See Comments)    Caused wheezing  . Pred Forte [Prednisolone Acetate] Other (See Comments)    Patient is seeing odd color and  images while using this  . Celebrex [Celecoxib] Swelling and Other (See Comments)    Swelling and numbness (mouth)  . Lanolin Itching  . Mobic [Meloxicam] Swelling and Other (See Comments)    Swelling and numbness in mouth  . Nitrates, Organic Rash and Other (See Comments)    Chest tightness also  . Petroleum Jelly [Petrolatum] Rash  . Soy Allergy Rash  . Strawberry Extract Rash  . Tylox [Oxycodone-Acetaminophen] Swelling, Rash and Cough   Discharge Instructions    Diet - low sodium heart healthy   Complete  by:  As directed    Discharge instructions   Complete by:  As directed    It is important that you read following instructions as well as go over your medication list with RN to help you understand your care after this hospitalization.  Discharge Instructions: Please follow-up with PCP in one week  Please request your primary care physician to go over all Hospital Tests and Procedure/Radiological results at the follow up,  Please get all Hospital records sent to your PCP by signing hospital release before you go home.   Do not take more than prescribed Pain, Sleep and Anxiety Medications. You were cared for by a hospitalist during your hospital stay. If you have any questions about your discharge medications or the care you received while you were in the hospital after you are discharged, you can call the unit and ask to speak with the hospitalist on call if the hospitalist that took care of you is not available.  Once you are discharged, your primary care physician will handle any further medical issues. Please note that NO REFILLS for any discharge medications will be authorized once you are discharged, as it is imperative that you return to your primary care physician (or establish a relationship with a primary care physician if you do not have one) for your aftercare needs so that they can reassess your need for medications and monitor your lab values. You Must read complete  instructions/literature along with all the possible adverse reactions/side effects for all the Medicines you take and that have been prescribed to you. Take any new Medicines after you have completely understood and accept all the possible adverse reactions/side effects. Wear Seat belts while driving. If you have smoked or chewed Tobacco in the last 2 yrs please stop smoking and/or stop any Recreational drug use.   Increase activity slowly   Complete by:  As directed      Discharge Exam: Filed Weights   07/05/18 2339 07/07/18 0500  Weight: 61.2 kg (135 lb) 62.2 kg (137 lb 3.2 oz)   Vitals:   07/07/18 0712 07/07/18 0943  BP:  (!) 145/70  Pulse:  (!) 55  Resp:    Temp: (!) 97.4 F (36.3 C)   SpO2:     General: Appear in no distress, no Rash; Oral Mucosa moist. Cardiovascular: S1 and S2 Present, no Murmur, no JVD Respiratory: Bilateral Air entry present and Clear to Auscultation, no Crackles, no wheezes Abdomen: Bowel Sound present, Soft and no tenderness Extremities: no Pedal edema, no calf tenderness Neurology: Grossly no focal neuro deficit.  The results of significant diagnostics from this hospitalization (including imaging, microbiology, ancillary and laboratory) are listed below for reference.    Significant Diagnostic Studies: Ct Head Wo Contrast  Result Date: 07/06/2018 CLINICAL DATA:  Acute headache with normal neuro exam. Uncontrolled blood pressure for 2 days EXAM: CT HEAD WITHOUT CONTRAST TECHNIQUE: Contiguous axial images were obtained from the base of the skull through the vertex without intravenous contrast. COMPARISON:  01/10/2018 FINDINGS: Brain: No evidence of acute infarction, hemorrhage, hydrocephalus, extra-axial collection or mass lesion/mass effect. Subtle low-density in the cerebral white matter correlating with mild chronic small vessel ischemia on a 2016 brain MRI. Vascular: No hyperdense vessel or unexpected calcification. Skull: Normal. Negative for fracture  or focal lesion. Sinuses/Orbits: Left cataract resection. IMPRESSION: No acute finding or explanation for headache. Electronically Signed   By: Monte Fantasia M.D.   On: 07/06/2018 00:07   Mr Brain Wo Contrast  Result Date: 07/06/2018 CLINICAL DATA:  Uncontrolled blood pressure for 2 days.  Headache. EXAM: MRI HEAD WITHOUT CONTRAST TECHNIQUE: Multiplanar, multiecho pulse sequences of the brain and surrounding structures were obtained without intravenous contrast. COMPARISON:  None. FINDINGS: Brain: No evidence for acute infarction, hemorrhage, mass lesion, hydrocephalus, or extra-axial fluid. Normal for age cerebral volume. Mild subcortical and periventricular T2 and FLAIR hyperintensities, likely chronic microvascular ischemic change. No features suggestive of PRES/hypertensive encephalopathy. No microbleeds on gradient imaging. Vascular: Flow voids are maintained in the carotid, basilar, and vertebral arteries. Skull and upper cervical spine: Normal marrow signal. No tonsillar herniation. Sinuses/Orbits: Paranasal sinuses are clear. LEFT cataract extraction. Other: No mastoid fluid. IMPRESSION: Normal for age cerebral volume with mild small vessel disease. No acute intracranial findings. No imaging features suggestive of PRES/hypertensive encephalopathy. Electronically Signed   By: Staci Righter M.D.   On: 07/06/2018 08:50    Microbiology: No results found for this or any previous visit (from the past 240 hour(s)).   Labs: CBC: Recent Labs  Lab 07/05/18 2339 07/06/18 0454  WBC 3.7* 3.9*  NEUTROABS 2.0  --   HGB 12.7 12.1  HCT 40.7 38.4  MCV 73.3* 73.4*  PLT 150 673*   Basic Metabolic Panel: Recent Labs  Lab 07/05/18 2339 07/06/18 0454 07/07/18 0456  NA 142 143 144  K 3.6 4.2 4.1  CL 106 110 109  CO2 26 26 27   GLUCOSE 113* 105* 98  BUN 13 13 12   CREATININE 1.05* 0.87 0.80  CALCIUM 9.6 9.0 9.6   Liver Function Tests: Recent Labs  Lab 07/06/18 0454  AST 21  ALT 12  ALKPHOS  81  BILITOT 0.5  PROT 6.3*  ALBUMIN 3.7   No results for input(s): LIPASE, AMYLASE in the last 168 hours. No results for input(s): AMMONIA in the last 168 hours. Cardiac Enzymes: No results for input(s): CKTOTAL, CKMB, CKMBINDEX, TROPONINI in the last 168 hours. BNP (last 3 results) No results for input(s): BNP in the last 8760 hours. CBG: Recent Labs  Lab 07/06/18 1805  GLUCAP 109*   Time spent: 35 minutes  Signed:  Berle Mull  [facility]  07/07/2018  , 4:36 PM

## 2018-07-08 LAB — METANEPHRINES, PLASMA
Metanephrine, Free: 58 pg/mL (ref 0–62)
NORMETANEPHRINE FREE: 201 pg/mL — AB (ref 0–145)

## 2018-07-10 DIAGNOSIS — F419 Anxiety disorder, unspecified: Secondary | ICD-10-CM | POA: Diagnosis not present

## 2018-07-10 DIAGNOSIS — R0989 Other specified symptoms and signs involving the circulatory and respiratory systems: Secondary | ICD-10-CM | POA: Diagnosis not present

## 2018-07-14 ENCOUNTER — Other Ambulatory Visit: Payer: Self-pay | Admitting: Family Medicine

## 2018-07-14 DIAGNOSIS — R0989 Other specified symptoms and signs involving the circulatory and respiratory systems: Secondary | ICD-10-CM

## 2018-07-24 DIAGNOSIS — R300 Dysuria: Secondary | ICD-10-CM | POA: Diagnosis not present

## 2018-07-30 ENCOUNTER — Ambulatory Visit
Admission: RE | Admit: 2018-07-30 | Discharge: 2018-07-30 | Disposition: A | Discharge status: Home / Self Care | Payer: Medicare HMO | Source: Ambulatory Visit | Attending: Family Medicine | Admitting: Family Medicine

## 2018-07-30 DIAGNOSIS — R0989 Other specified symptoms and signs involving the circulatory and respiratory systems: Secondary | ICD-10-CM

## 2018-08-03 DIAGNOSIS — R51 Headache: Secondary | ICD-10-CM

## 2018-08-03 DIAGNOSIS — R519 Headache, unspecified: Secondary | ICD-10-CM

## 2018-08-07 DIAGNOSIS — R001 Bradycardia, unspecified: Secondary | ICD-10-CM | POA: Diagnosis not present

## 2018-08-07 DIAGNOSIS — I10 Essential (primary) hypertension: Secondary | ICD-10-CM | POA: Diagnosis not present

## 2018-08-10 DIAGNOSIS — I10 Essential (primary) hypertension: Secondary | ICD-10-CM | POA: Diagnosis not present

## 2018-08-10 DIAGNOSIS — Z79899 Other long term (current) drug therapy: Secondary | ICD-10-CM | POA: Diagnosis not present

## 2018-08-10 DIAGNOSIS — M79606 Pain in leg, unspecified: Secondary | ICD-10-CM | POA: Diagnosis not present

## 2018-08-10 DIAGNOSIS — R0989 Other specified symptoms and signs involving the circulatory and respiratory systems: Secondary | ICD-10-CM | POA: Diagnosis not present

## 2018-08-10 DIAGNOSIS — R6889 Other general symptoms and signs: Secondary | ICD-10-CM | POA: Diagnosis not present

## 2018-08-24 DIAGNOSIS — R0989 Other specified symptoms and signs involving the circulatory and respiratory systems: Secondary | ICD-10-CM | POA: Diagnosis not present

## 2018-08-24 DIAGNOSIS — Z0189 Encounter for other specified special examinations: Secondary | ICD-10-CM | POA: Diagnosis not present

## 2018-08-24 DIAGNOSIS — E782 Mixed hyperlipidemia: Secondary | ICD-10-CM | POA: Diagnosis not present

## 2018-08-24 DIAGNOSIS — I700 Atherosclerosis of aorta: Secondary | ICD-10-CM | POA: Diagnosis not present

## 2018-08-25 DIAGNOSIS — N3010 Interstitial cystitis (chronic) without hematuria: Secondary | ICD-10-CM | POA: Diagnosis not present

## 2018-09-01 DIAGNOSIS — I10 Essential (primary) hypertension: Secondary | ICD-10-CM | POA: Diagnosis not present

## 2018-09-01 DIAGNOSIS — R002 Palpitations: Secondary | ICD-10-CM | POA: Diagnosis not present

## 2018-09-01 DIAGNOSIS — R634 Abnormal weight loss: Secondary | ICD-10-CM | POA: Diagnosis not present

## 2018-09-02 DIAGNOSIS — E119 Type 2 diabetes mellitus without complications: Secondary | ICD-10-CM | POA: Diagnosis not present

## 2018-09-02 DIAGNOSIS — Z961 Presence of intraocular lens: Secondary | ICD-10-CM | POA: Diagnosis not present

## 2018-09-02 DIAGNOSIS — H04123 Dry eye syndrome of bilateral lacrimal glands: Secondary | ICD-10-CM | POA: Diagnosis not present

## 2018-09-02 DIAGNOSIS — H25811 Combined forms of age-related cataract, right eye: Secondary | ICD-10-CM | POA: Diagnosis not present

## 2018-09-04 DIAGNOSIS — R002 Palpitations: Secondary | ICD-10-CM | POA: Diagnosis not present

## 2018-09-04 DIAGNOSIS — I10 Essential (primary) hypertension: Secondary | ICD-10-CM | POA: Diagnosis not present

## 2018-09-07 DIAGNOSIS — R002 Palpitations: Secondary | ICD-10-CM | POA: Diagnosis not present

## 2018-09-07 DIAGNOSIS — I10 Essential (primary) hypertension: Secondary | ICD-10-CM | POA: Diagnosis not present

## 2018-09-07 DIAGNOSIS — N3010 Interstitial cystitis (chronic) without hematuria: Secondary | ICD-10-CM | POA: Diagnosis not present

## 2018-09-09 ENCOUNTER — Encounter (HOSPITAL_COMMUNITY): Payer: Self-pay

## 2018-09-09 ENCOUNTER — Other Ambulatory Visit: Payer: Self-pay

## 2018-09-09 ENCOUNTER — Ambulatory Visit (HOSPITAL_COMMUNITY)
Admission: EM | Admit: 2018-09-09 | Discharge: 2018-09-09 | Disposition: A | Discharge status: Home / Self Care | Payer: Medicare HMO | Attending: Family Medicine | Admitting: Family Medicine

## 2018-09-09 DIAGNOSIS — R51 Headache: Secondary | ICD-10-CM | POA: Diagnosis not present

## 2018-09-09 DIAGNOSIS — W57XXXA Bitten or stung by nonvenomous insect and other nonvenomous arthropods, initial encounter: Secondary | ICD-10-CM | POA: Diagnosis not present

## 2018-09-09 DIAGNOSIS — S1096XA Insect bite of unspecified part of neck, initial encounter: Secondary | ICD-10-CM

## 2018-09-09 DIAGNOSIS — E782 Mixed hyperlipidemia: Secondary | ICD-10-CM | POA: Diagnosis not present

## 2018-09-09 MED ORDER — DOXYCYCLINE HYCLATE 100 MG PO CAPS: 100.0000 mg | ORAL_CAPSULE | Freq: Two times a day (BID) | ORAL | 0 refills | Status: AC
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
## 2018-09-09 NOTE — ED Provider Notes (Signed)
[facility]    CSN: [id number] Arrival date & time: 09/09/18  0758     History   Chief Complaint Chief Complaint  Patient presents with  . Insect Bite    HPI Donna Baldwin is a 77 y.o. female history of hypertension, hyperlipidemia, coronary artery disease presenting today for evaluation of insect bite.  Patient states that on 8/29 she was bit by an exact, she felt immediate pain and killed the bug.  Since she has had persistent discomfort in the area where she was bitten.  Since she has developed significant itching.  She denies any difficulty moving her neck, although does have some slight discomfort with movement backwards.  He denies any fevers.  She has had some headaches in her upper neck/lower head.  She has been using tea tree oil and triamcinolone ointment 0.05.  She is concerned that her symptoms have persisted and also would like to have the bug identified.  HPI  Past Medical History:  Diagnosis Date  . Anemia   . Anxiety   . Atherosclerosis   . Back pain   . Coronary artery disease   . GERD (gastroesophageal reflux disease)   . HTN (hypertension) 12/03/2013   CT scan-no pheochromocytoma  . Hyperlipidemia   . Hypertension   . Hypertensive heart disease without heart failure   . Thrombocytopenia ([place])   . Vitamin D deficiency     Patient Active Problem List   Diagnosis Date Noted  . Nonintractable headache   . TIA (transient ischemic attack) 07/06/2018  . Hypertensive urgency 07/06/2018  . History of diabetes mellitus 04/01/2017  . 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 Hereditary and idiopathic peripheral neuropathy 07/07/2014  . Aortic atherosclerosis ([place]) 12/03/2013  . Familial hyperlipidemia 12/03/2013  . HTN (hypertension) 12/03/2013  . GERD (gastroesophageal reflux disease) 03/24/2013  . Obstructive sleep apnea, ruled out 02/28/2013  . 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 Urticaria due to food allergy  08/31/2012  . Thrombocytopenia ([place])     Past Surgical History:  Procedure Laterality Date  . EYE SURGERY    . SHOULDER SURGERY Bilateral   . TONSILLECTOMY      OB History   None      Home Medications    Prior to Admission medications   Medication Sig Start Date End Date Taking? Authorizing Provider  amLODipine (NORVASC) 5 MG tablet Take 1 tablet (5 mg total) by mouth 2 (two) times daily. 07/07/18   Lavina Hamman, MD  aspirin 81 MG tablet Take 81 mg by mouth daily as needed (for pain and pressure).     [provider]  busPIRone (BUSPAR) 5 MG tablet Take 1 tablet (5 mg total) by mouth 2 (two) times daily. 07/07/18   Lavina Hamman, MD  cetirizine (ZYRTEC) 10 MG tablet Take 10 mg by mouth daily as needed for allergies.     [provider]  docusate sodium (COLACE) 100 MG capsule Take 100 mg by mouth daily as needed for mild constipation.    [provider]  doxycycline (VIBRAMYCIN) 100 MG capsule Take 1 capsule (100 mg total) by mouth 2 (two) times daily for 7 days. 09/09/18 09/16/18  Brinleigh Tew C, PA-C  EPINEPHrine 0.3 mg/0.3 mL IJ SOAJ injection Inject 0.3 mg into the muscle once as needed (for anaphylaxis).  05/31/16   [provider]  hydrALAZINE (APRESOLINE) 50 MG tablet Take 1 tablet (50 mg total) by mouth every 8 (eight) hours. 07/07/18  Lavina Hamman, MD  LORazepam (ATIVAN) 1 MG tablet Take 0.5 tablets (0.5 mg total) by mouth every 8 (eight) hours as needed for anxiety. 07/29/15   Jola Schmidt, MD  Magnesium Hydroxide (MILK OF MAGNESIA PO) Take 15 mLs by mouth daily as needed (CONSTIPATION).    [provider]  spironolactone (ALDACTONE) 25 MG tablet Take 12.5 mg by mouth daily. 06/29/18   [provider]  spironolactone (ALDACTONE) 5 mg/mL SUSP oral suspension Take 5 mg/kg by mouth daily.    [provider]    Family History Family History  Problem Relation Age of Onset  . Cancer Father     Social  History Social History   Tobacco Use  . Smoking status: Never Smoker  . Smokeless tobacco: Never Used  Substance Use Topics  . Alcohol use: No    Alcohol/week: 0.0 standard drinks  . Drug use: No     Allergies   Acetaminophen; Beef-derived products; Codeine; Epinephrine; Latex; Morphine and related; Penicillins; Sertraline; Shellfish allergy; Solu-medrol [methylprednisolone]; Sulfa antibiotics; Barbiturates; Bee pollen; Bee venom; Diovan [valsartan]; Gabapentin; Ivp dye [iodinated diagnostic agents]; Livalo [pitavastatin]; Nutritional supplements; Other; Oxycodone; Promethazine-dm; Tape; Albuterol; Pred forte [prednisolone acetate]; Celebrex [celecoxib]; Lanolin; Mobic [meloxicam]; Nitrates, organic; Petroleum jelly [petrolatum]; Soy allergy; Strawberry extract; and Tylox [oxycodone-acetaminophen]   Review of Systems Review of Systems  Constitutional: Negative for fatigue and fever.  Eyes: Negative for visual disturbance.  Respiratory: Negative for shortness of breath.   Cardiovascular: Negative for chest pain.  Gastrointestinal: Negative for abdominal pain, nausea and vomiting.  Musculoskeletal: Positive for neck pain. Negative for arthralgias, joint swelling and neck stiffness.  Skin: Positive for color change and rash. Negative for wound.  Neurological: Positive for headaches. Negative for dizziness, weakness and light-headedness.     Physical Exam Triage Vital Signs ED Triage Vitals  Enc Vitals Group     BP 09/09/18 0805 (!) 171/96     Pulse Rate 09/09/18 0805 78     Resp 09/09/18 0805 18     Temp 09/09/18 0805 97.7 F (36.5 C)     Temp src --      SpO2 09/09/18 0805 99 %     Weight 09/09/18 0808 135 lb (61.2 kg)     Height --      Head Circumference --      Peak Flow --      Pain Score 09/09/18 0808 0     Pain Loc --      Pain Edu? --      Excl. in [place]? --    No data found.  Updated Vital Signs BP (!) 171/96 (BP Location: Left Arm)   Pulse 78   Temp 97.7 F  (36.5 C)   Resp 18   Wt 135 lb (61.2 kg)   SpO2 99%   BMI 23.91 kg/m   Visual Acuity Right Eye Distance:   Left Eye Distance:   Bilateral Distance:    Right Eye Near:   Left Eye Near:    Bilateral Near:     Physical Exam  Constitutional: She is oriented to person, place, and time. She appears well-developed and well-nourished.  No acute distress  HENT:  Head: Normocephalic and atraumatic.  Nose: Nose normal.  Eyes: Conjunctivae are normal.  Neck: Neck supple.  Superior posterior cervical chain versus occipital lymphadenopathy present on left side  Cardiovascular: Normal rate and normal heart sounds.  Pulmonary/Chest: Effort normal and breath sounds normal. No respiratory distress.  Breathing comfortably at  rest, CTABL, no wheezing, rales or other adventitious sounds auscultated  Abdominal: She exhibits no distension.  Musculoskeletal: Normal range of motion.  Neurological: She is alert and oriented to person, place, and time.  Skin: Skin is warm and dry.  Hyperpigmented 1 cm lesion to posterior neck with central scab lesion, no increased warmth, fluctuance or surrounding erythema  Psychiatric: She has a normal mood and affect.  Nursing note and vitals reviewed.      UC Treatments / Results  Labs (all labs ordered are listed, but only abnormal results are displayed) Labs Reviewed - No data to display  EKG None  Radiology No results found.  Procedures Procedures (including critical care time)  Medications Ordered in UC Medications - No data to display  Initial Impression / Assessment and Plan / UC Course  I have reviewed the triage vital signs and the nursing notes.  Pertinent labs & imaging results that were available during my care of the patient were reviewed by me and considered in my medical decision making (see chart for details).     Patient with persistent symptoms of bug bite, lesion itself still present, but does not appear infected, patient  does have lymphadenopathy, because of this will provide patient with a course of doxycycline, avoided Keflex given patient had previous anaphylactic reaction to penicillins.  Recommended to continue antihistamines, warm compresses and triamcinolone cream to help with itching.  Lesion likely needs increased time to resolve.  No systemic symptoms.Discussed strict return precautions. Patient verbalized understanding and is agreeable with plan.  Final Clinical Impressions(s) / UC Diagnoses   Final diagnoses:  Insect bite of neck, initial encounter     Discharge Instructions     Please apply warm compresses Take zyrtec daily to help with itching; may also continue to use triamcinolone cream twice daily to this area Begin doxycycline to treat any underlying infection twice daily for 1 week  Please continue to monitor your symptoms, please follow-up if you are developing fever, worsening pain, swelling, difficulty moving neck, feeling lightheaded or weak   ED Prescriptions    Medication Sig Dispense Auth. Provider   doxycycline (VIBRAMYCIN) 100 MG capsule Take 1 capsule (100 mg total) by mouth 2 (two) times daily for 7 days. 20 capsule Ewel Lona C, PA-C     Controlled Substance Prescriptions [place] Controlled Substance Registry consulted? Not Applicable   Janith Lima, [place] 09/09/18 1034

## 2018-09-09 NOTE — Discharge Instructions (Addendum)
Please apply warm compresses Take zyrtec daily to help with itching; may also continue to use triamcinolone cream twice daily to this area Begin doxycycline to treat any underlying infection twice daily for 1 week  Please continue to monitor your symptoms, please follow-up if you are developing fever, worsening pain, swelling, difficulty moving neck, feeling lightheaded or weak

## 2018-09-10 DIAGNOSIS — N3010 Interstitial cystitis (chronic) without hematuria: Secondary | ICD-10-CM | POA: Diagnosis not present

## 2018-09-11 DIAGNOSIS — E1169 Type 2 diabetes mellitus with other specified complication: Secondary | ICD-10-CM | POA: Diagnosis not present

## 2018-09-11 DIAGNOSIS — I10 Essential (primary) hypertension: Secondary | ICD-10-CM | POA: Diagnosis not present

## 2018-09-11 DIAGNOSIS — G720 Drug-induced myopathy: Secondary | ICD-10-CM | POA: Diagnosis not present

## 2018-09-11 DIAGNOSIS — E785 Hyperlipidemia, unspecified: Secondary | ICD-10-CM | POA: Diagnosis not present

## 2018-09-11 DIAGNOSIS — R0989 Other specified symptoms and signs involving the circulatory and respiratory systems: Secondary | ICD-10-CM | POA: Diagnosis not present

## 2018-09-11 DIAGNOSIS — R1013 Epigastric pain: Secondary | ICD-10-CM | POA: Diagnosis not present

## 2018-09-14 DIAGNOSIS — E782 Mixed hyperlipidemia: Secondary | ICD-10-CM | POA: Diagnosis not present

## 2018-09-14 DIAGNOSIS — Z0189 Encounter for other specified special examinations: Secondary | ICD-10-CM | POA: Diagnosis not present

## 2018-09-14 DIAGNOSIS — R0989 Other specified symptoms and signs involving the circulatory and respiratory systems: Secondary | ICD-10-CM | POA: Diagnosis not present

## 2018-09-14 DIAGNOSIS — I700 Atherosclerosis of aorta: Secondary | ICD-10-CM | POA: Diagnosis not present

## 2018-09-15 DIAGNOSIS — N3010 Interstitial cystitis (chronic) without hematuria: Secondary | ICD-10-CM | POA: Diagnosis not present

## 2018-09-24 DIAGNOSIS — N3010 Interstitial cystitis (chronic) without hematuria: Secondary | ICD-10-CM | POA: Diagnosis not present

## 2018-09-25 ENCOUNTER — Other Ambulatory Visit: Payer: Self-pay | Admitting: Gastroenterology

## 2018-09-25 ENCOUNTER — Telehealth: Payer: Self-pay | Admitting: Nurse Practitioner

## 2018-09-25 DIAGNOSIS — R1084 Generalized abdominal pain: Secondary | ICD-10-CM | POA: Diagnosis not present

## 2018-09-25 DIAGNOSIS — K219 Gastro-esophageal reflux disease without esophagitis: Secondary | ICD-10-CM | POA: Diagnosis not present

## 2018-09-25 DIAGNOSIS — K5900 Constipation, unspecified: Secondary | ICD-10-CM | POA: Diagnosis not present

## 2018-09-25 DIAGNOSIS — R12 Heartburn: Secondary | ICD-10-CM | POA: Diagnosis not present

## 2018-09-25 NOTE — Telephone Encounter (Signed)
13hr prep called into pharmacy for patient for CT on 10/3 at 2pm. [facility] to make patient aware.  0100- 50mg  Prenisone 0700- 50mg  Prednisone 1300- 50mg  Prednisone and 50mg  Benadryl

## 2018-09-29 ENCOUNTER — Other Ambulatory Visit: Payer: Self-pay | Admitting: Gastroenterology

## 2018-09-29 DIAGNOSIS — R1084 Generalized abdominal pain: Secondary | ICD-10-CM

## 2018-09-29 DIAGNOSIS — N3010 Interstitial cystitis (chronic) without hematuria: Secondary | ICD-10-CM | POA: Diagnosis not present

## 2018-10-01 ENCOUNTER — Ambulatory Visit
Admission: RE | Admit: 2018-10-01 | Discharge: 2018-10-01 | Disposition: A | Discharge status: Home / Self Care | Payer: Medicare HMO | Source: Ambulatory Visit | Attending: Gastroenterology | Admitting: Gastroenterology

## 2018-10-01 ENCOUNTER — Inpatient Hospital Stay: Admission: RE | Admit: 2018-10-01 | Payer: Medicare HMO | Source: Ambulatory Visit

## 2018-10-01 DIAGNOSIS — R110 Nausea: Secondary | ICD-10-CM | POA: Diagnosis not present

## 2018-10-01 DIAGNOSIS — R1084 Generalized abdominal pain: Secondary | ICD-10-CM

## 2018-10-06 DIAGNOSIS — N3010 Interstitial cystitis (chronic) without hematuria: Secondary | ICD-10-CM | POA: Diagnosis not present

## 2018-10-07 DIAGNOSIS — E782 Mixed hyperlipidemia: Secondary | ICD-10-CM | POA: Diagnosis not present

## 2018-10-07 DIAGNOSIS — R0989 Other specified symptoms and signs involving the circulatory and respiratory systems: Secondary | ICD-10-CM | POA: Diagnosis not present

## 2018-10-07 DIAGNOSIS — Z0189 Encounter for other specified special examinations: Secondary | ICD-10-CM | POA: Diagnosis not present

## 2018-10-07 DIAGNOSIS — R0789 Other chest pain: Secondary | ICD-10-CM | POA: Diagnosis not present

## 2018-10-09 DIAGNOSIS — N3010 Interstitial cystitis (chronic) without hematuria: Secondary | ICD-10-CM | POA: Diagnosis not present

## 2018-10-23 DIAGNOSIS — R001 Bradycardia, unspecified: Secondary | ICD-10-CM | POA: Diagnosis not present

## 2018-10-28 DIAGNOSIS — R82998 Other abnormal findings in urine: Secondary | ICD-10-CM | POA: Diagnosis not present

## 2018-10-28 DIAGNOSIS — N3010 Interstitial cystitis (chronic) without hematuria: Secondary | ICD-10-CM | POA: Diagnosis not present

## 2018-11-06 DIAGNOSIS — R399 Unspecified symptoms and signs involving the genitourinary system: Secondary | ICD-10-CM | POA: Diagnosis not present

## 2018-11-06 DIAGNOSIS — R82998 Other abnormal findings in urine: Secondary | ICD-10-CM | POA: Diagnosis not present

## 2018-11-23 DIAGNOSIS — H04123 Dry eye syndrome of bilateral lacrimal glands: Secondary | ICD-10-CM | POA: Diagnosis not present

## 2018-11-23 DIAGNOSIS — E119 Type 2 diabetes mellitus without complications: Secondary | ICD-10-CM | POA: Diagnosis not present

## 2018-11-23 DIAGNOSIS — H25811 Combined forms of age-related cataract, right eye: Secondary | ICD-10-CM | POA: Diagnosis not present

## 2018-11-23 DIAGNOSIS — Z961 Presence of intraocular lens: Secondary | ICD-10-CM | POA: Diagnosis not present

## 2018-12-07 DIAGNOSIS — N3010 Interstitial cystitis (chronic) without hematuria: Secondary | ICD-10-CM | POA: Diagnosis not present

## 2018-12-14 DIAGNOSIS — H2511 Age-related nuclear cataract, right eye: Secondary | ICD-10-CM | POA: Diagnosis not present

## 2018-12-14 DIAGNOSIS — H25811 Combined forms of age-related cataract, right eye: Secondary | ICD-10-CM | POA: Diagnosis not present

## 2019-01-07 DIAGNOSIS — R0789 Other chest pain: Secondary | ICD-10-CM | POA: Diagnosis not present

## 2019-01-07 DIAGNOSIS — E782 Mixed hyperlipidemia: Secondary | ICD-10-CM | POA: Diagnosis not present

## 2019-01-07 DIAGNOSIS — Z0189 Encounter for other specified special examinations: Secondary | ICD-10-CM | POA: Diagnosis not present

## 2019-01-07 DIAGNOSIS — R002 Palpitations: Secondary | ICD-10-CM | POA: Diagnosis not present

## 2019-01-07 DIAGNOSIS — I700 Atherosclerosis of aorta: Secondary | ICD-10-CM | POA: Diagnosis not present

## 2019-01-07 DIAGNOSIS — R0989 Other specified symptoms and signs involving the circulatory and respiratory systems: Secondary | ICD-10-CM | POA: Diagnosis not present

## 2019-01-07 DIAGNOSIS — E119 Type 2 diabetes mellitus without complications: Secondary | ICD-10-CM | POA: Diagnosis not present

## 2019-01-29 DIAGNOSIS — I10 Essential (primary) hypertension: Secondary | ICD-10-CM | POA: Diagnosis not present

## 2019-01-29 DIAGNOSIS — E785 Hyperlipidemia, unspecified: Secondary | ICD-10-CM | POA: Diagnosis not present

## 2019-01-29 DIAGNOSIS — E559 Vitamin D deficiency, unspecified: Secondary | ICD-10-CM | POA: Diagnosis not present

## 2019-01-29 DIAGNOSIS — Z0000 Encounter for general adult medical examination without abnormal findings: Secondary | ICD-10-CM | POA: Diagnosis not present

## 2019-01-29 DIAGNOSIS — E1169 Type 2 diabetes mellitus with other specified complication: Secondary | ICD-10-CM | POA: Diagnosis not present

## 2019-01-29 DIAGNOSIS — Z23 Encounter for immunization: Secondary | ICD-10-CM | POA: Diagnosis not present

## 2019-02-05 DIAGNOSIS — R002 Palpitations: Secondary | ICD-10-CM | POA: Diagnosis not present

## 2019-02-08 ENCOUNTER — Encounter: Payer: Self-pay | Admitting: Cardiology

## 2019-02-08 DIAGNOSIS — R002 Palpitations: Secondary | ICD-10-CM | POA: Insufficient documentation

## 2019-02-15 DIAGNOSIS — I10 Essential (primary) hypertension: Secondary | ICD-10-CM | POA: Diagnosis not present

## 2019-02-15 DIAGNOSIS — R002 Palpitations: Secondary | ICD-10-CM | POA: Diagnosis not present

## 2019-02-15 DIAGNOSIS — R531 Weakness: Secondary | ICD-10-CM | POA: Diagnosis not present

## 2019-02-16 NOTE — Progress Notes (Signed)
Patient is here for follow up visit.  Subjective:   @Patient  ID: Donna Baldwin, female    DOB: Apr 02, 1941, 78 y.o.   MRN: [id number]  Chief Complaint  Patient presents with  . Hypertension  . Follow-up    HPI   78 year old African-American female with labile hypertension, asymptomatic bradycardia, type II diabetes mellitus, chronic dysphagia, interstitial cystitis, here for 4 week follow up.  At last visit, I had resumed her amlodipine 10 mg for hypertension. She previously had submaximal stress test. but her chest pain had subsequently resolved. In future, if she had recurrent chest pain, I recommended that she gets coronary CT angiogram or lexiscan nuclear stress test.  Blood pressure better today, but continues to have wide variations. She has also had palpitations at night. She is wondering if she should increase bisoprolol dose as suggested by her PCP. She has been intolerant to several different antihypertensive, as well as lipid lowering medications.     Past Medical History:  Diagnosis Date  . Anemia   . Anxiety   . Atherosclerosis   . Back pain   . Coronary artery disease   . GERD (gastroesophageal reflux disease)   . HTN (hypertension) 12/03/2013   CT scan-no pheochromocytoma  . Hyperlipidemia   . Hypertension   . Hypertensive heart disease without heart failure   . Thrombocytopenia ([place])   . Vitamin D deficiency     Past Surgical History:  Procedure Laterality Date  . EYE SURGERY    . SHOULDER SURGERY Bilateral   . TONSILLECTOMY      Social History   Socioeconomic History  . Marital status: Married    Spouse name: Not on file  . Number of children: Not on file  . Years of education: Not on file  . Highest education level: Not on file  Occupational History  . Not on file  Social Needs  . Financial resource strain: Not on file  . Food insecurity:    Worry: Not on file    Inability: Not on file  . Transportation needs:    Medical: Not on  file    Non-medical: Not on file  Tobacco Use  . Smoking status: Never Smoker  . Smokeless tobacco: Never Used  Substance and Sexual Activity  . Alcohol use: No    Alcohol/week: 0.0 standard drinks  . Drug use: No  . Sexual activity: Never  Lifestyle  . Physical activity:    Days per week: Not on file    Minutes per session: Not on file  . Stress: Not on file  Relationships  . Social connections:    Talks on phone: Not on file    Gets together: Not on file    Attends religious service: Not on file    Active member of club or organization: Not on file    Attends meetings of clubs or organizations: Not on file    Relationship status: Not on file  . Intimate partner violence:    Fear of current or ex partner: Not on file    Emotionally abused: Not on file    Physically abused: Not on file    Forced sexual activity: Not on file  Other Topics Concern  . Not on file  Social History Narrative  . Not on file    Current Outpatient Medications on File Prior to Visit  Medication Sig Dispense Refill  . amLODipine (NORVASC) 2.5 MG tablet Take 5 mg by mouth 2 (two) times daily.     [name]  aspirin 81 MG tablet Take 81 mg by mouth daily as needed (for pain and pressure).     . BYSTOLIC 5 MG tablet Take 5 mg by mouth daily.    . cetirizine (ZYRTEC) 10 MG tablet Take 10 mg by mouth daily as needed for allergies.     [name] docusate sodium (COLACE) 100 MG capsule Take 100 mg by mouth daily as needed for mild constipation.    [name] EPINEPHrine 0.3 mg/0.3 mL IJ SOAJ injection Inject 0.3 mg into the muscle once as needed (for anaphylaxis).   1  . LORazepam (ATIVAN) 1 MG tablet Take 0.5 tablets (0.5 mg total) by mouth every 8 (eight) hours as needed for anxiety. 10 tablet 0  . Magnesium Hydroxide (MILK OF MAGNESIA PO) Take 15 mLs by mouth daily as needed (CONSTIPATION).    [name] nebivolol (BYSTOLIC) 2.5 MG tablet Take 2.5 mg by mouth daily.     No current facility-administered medications on file prior to  visit.     Cardiovascular studies:  Event monitor 01/07/2019- 02/05/2019: Sinus bradycardia, lowest HR 32 bpm @ 3:55 AM Occasional PVC's correlate with symptoms of fatigue. Highest HR 109 bpm.  Exercise Treadmill Stress Test 10/23/2018:  Indication: Bradycardia The patient exercised on Bruce protocol for 06:17 min. Patient achieved 7.50 METS and reached HR 110 bpm, which is 76 % of maximum age-predicted HR. Stress test terminated due to fatigue.   Exercise capacity was below average for age. HR Response to Exercise: Sub-optimal secondary to limited exercise capacity BP Response to Exercise: Normal resting BP- appropriate response. Chest Pain: none. Arrhythmias: Occasional PVCs. Resting EKG demonstrates Normal sinus rhythm. ST Changes: With peak exercise there was no ST-T changes of ischemia.  Overall Impression: Submaximal stress test. Attenuated heart rate response without hypotension, lightheadedness Recommendations: Continue primary/secondary prevention. Consider alternate testing, if clinical suspicion for obstructive CAD is high.  Outside echocardiogram 07/06/2018: - Left ventricle: The cavity size was normal. Wall thickness was  normal. Systolic function was normal. The estimated ejection  fraction was in the range of 55% to 60%. Wall motion was normal;  there were no regional wall motion abnormalities. Doppler  parameters are consistent with abnormal left ventricular  relaxation (grade 1 diastolic dysfunction). - Left atrium: The atrium was mildly dilated. - Right atrium: The atrium was mildly dilated.   Impressions:   - Normal LV systolic function; mild diastolic dysfunction; mild  biatrial enlargement.  CT abdomen 06/2016: IMPRESSION: Nonspecific mild wall thickening of the distal stomach and duodenum can be seen with chronic gastritis or duodenitis. No evidence of bowel obstruction or perforation. Negative for abscess.  No other acute  intra-abdominal or pelvic process by noncontrast CT. Aortoiliac atherosclerosis  Review of Systems  Constitution: Positive for malaise/fatigue and night sweats. Negative for decreased appetite, weight gain and weight loss.  HENT: Negative for congestion.   Eyes: Negative for visual disturbance.  Cardiovascular: Positive for irregular heartbeat and palpitations. Negative for chest pain, dyspnea on exertion, leg swelling and syncope.  Respiratory: Negative for shortness of breath.   Endocrine: Negative for cold intolerance.  Hematologic/Lymphatic: Does not bruise/bleed easily.  Skin: Negative for itching and rash.  Musculoskeletal: Negative for myalgias.  Gastrointestinal: Negative for abdominal pain, nausea and vomiting.  Genitourinary: Negative for dysuria.  Neurological: Positive for headaches. Negative for dizziness and weakness. Light-headedness: increased in headaches. Admits to 1 headache everyday last week.  Psychiatric/Behavioral: The patient is not nervous/anxious.   All other systems reviewed and are negative.  Objective:   Vitals:   02/17/19 1011  BP: (!) 149/71  Pulse: (!) 49  SpO2: 99%     Physical Exam  Constitutional: She is oriented to person, place, and time. She appears well-developed and well-nourished. No distress.  HENT:  Head: Normocephalic and atraumatic.  Eyes: Pupils are equal, round, and reactive to light. Conjunctivae are normal.  Neck: No JVD present.  Cardiovascular: Normal rate, regular rhythm and intact distal pulses.  Pulmonary/Chest: Effort normal and breath sounds normal. She has no wheezes. She has no rales.  Abdominal: Soft. Bowel sounds are normal. There is no rebound.  Musculoskeletal:        General: No edema.  Lymphadenopathy:    She has no cervical adenopathy.  Neurological: She is alert and oriented to person, place, and time. No cranial nerve deficit.  Skin: Skin is warm and dry.  Psychiatric: She has a normal mood and affect.   Nursing note and vitals reviewed.       Assessment & Recommendations:   78 year old African-American female with labile hypertension, asymptomatic bradycardia, type II diabetes mellitus, chronic dysphagia, interstitial cystitis.  1. Essential hypertension Uncontrolled. Currently on amlodipine 5 mg twice daily, bisoprolol 5 mg daily. Recommend adding Bidil 20-37.5 mg bid. Samples given, prescription sent to pharmacy.  Reported reaction to nitrates in the past included minor rash, without any severe anaphylactic reactions.  I have encouraged her to use cetirizine or Benadryl in case minor rash occurs. Given her resting bradycardia with HR in 30s during sleep, I would not recommend increasing bisoprolol.   2. Mixed hyperlipidemia Intolerant to several different statins. Does not want to try PCSK9 inhibitor at this time. Will try Livalo. Samples given, prescription sent.   3. Palpitations We will monitor shows occasional PVCs, with resting bradycardia in the low 30s during sleep.  I suspect her nocturnal bradycardia is further compounded by undiagnosed sleep apnea.  Recommend referral to sleep study.  I will see her back in 8 weeks.  Nigel Mormon, MD [facility] Pager: [phone] Office: [phone] If no answer Cell [phone]

## 2019-02-17 ENCOUNTER — Encounter (INDEPENDENT_AMBULATORY_CARE_PROVIDER_SITE_OTHER): Payer: Self-pay

## 2019-02-17 ENCOUNTER — Ambulatory Visit (INDEPENDENT_AMBULATORY_CARE_PROVIDER_SITE_OTHER): Payer: Medicare HMO | Admitting: Cardiology

## 2019-02-17 ENCOUNTER — Encounter: Payer: Self-pay | Admitting: Cardiology

## 2019-02-17 ENCOUNTER — Telehealth: Payer: Self-pay

## 2019-02-17 VITALS — BP 149/71 | HR 49 | Ht 64.0 in | Wt 137.4 lb

## 2019-02-17 DIAGNOSIS — I10 Essential (primary) hypertension: Secondary | ICD-10-CM

## 2019-02-17 DIAGNOSIS — E782 Mixed hyperlipidemia: Secondary | ICD-10-CM

## 2019-02-17 DIAGNOSIS — R0683 Snoring: Secondary | ICD-10-CM | POA: Diagnosis not present

## 2019-02-17 DIAGNOSIS — R002 Palpitations: Secondary | ICD-10-CM

## 2019-02-17 MED ORDER — PITAVASTATIN CALCIUM 4 MG PO TABS: 1.0000 | ORAL_TABLET | ORAL | 3 refills | Status: DC

## 2019-02-17 MED ORDER — ISOSORB DINITRATE-HYDRALAZINE 20-37.5 MG PO TABS: 1.0000 | ORAL_TABLET | Freq: Two times a day (BID) | ORAL | 2 refills | Status: DC

## 2019-02-17 NOTE — Telephone Encounter (Signed)
Will discuss different options at next visit. She is intolerant to several other cholesterol medications

## 2019-02-17 NOTE — Telephone Encounter (Signed)
Pt requests new rx Livalo not covered by insurance

## 2019-02-17 NOTE — Patient Instructions (Signed)
1. Start Bidil 1 tablet twice a day to help control blood pressure. Continue Amlodipine 10mg  and Bystolic 5mg . If a rash appears, take Benadryl or Zyrtec. If you experience tongue swelling or difficulty breathing, stop the medication and report to the emergency department. Continue to monitor blood pressure and record in diary and bring to next office visit. 2. Start Livalo 4mg  1 tablet every other day to help control cholesterol. If unable to tolerate medication, we will discuss starting injections at next office visit. 3. You are being referred for a sleep study to check for sleep apnea. 4. Follow-up in 8 weeks to recheck blood pressure. We will hold off on the CT coronary artery scan until blood pressure is well controlled.

## 2019-02-18 NOTE — Telephone Encounter (Signed)
Pt is aware she will take the samples you have given her but wont pay the money for the Rx due to high cost

## 2019-02-19 ENCOUNTER — Other Ambulatory Visit: Payer: Self-pay

## 2019-02-19 DIAGNOSIS — E782 Mixed hyperlipidemia: Secondary | ICD-10-CM

## 2019-02-19 MED ORDER — PITAVASTATIN CALCIUM 4 MG PO TABS: 1.0000 | ORAL_TABLET | ORAL | 3 refills | Status: DC

## 2019-03-03 ENCOUNTER — Ambulatory Visit: Payer: Self-pay | Admitting: Cardiology

## 2019-03-09 ENCOUNTER — Telehealth: Payer: Self-pay

## 2019-03-09 NOTE — Telephone Encounter (Signed)
Yes. Bidil was added as her blood pressure was not controlled. Recommend continuing as prescribed.  Thanks MJP

## 2019-03-09 NOTE — Telephone Encounter (Signed)
Pt called concerned about her BiDil. Wants to know if she is supposed to be taking it along with her other meds. Pt stated she is feeling PCV's in chest.  Please Advise.

## 2019-03-09 NOTE — Telephone Encounter (Signed)
Called Pt and let her know.

## 2019-03-29 ENCOUNTER — Institutional Professional Consult (permissible substitution): Payer: Self-pay | Admitting: Neurology

## 2019-03-31 ENCOUNTER — Ambulatory Visit: Payer: Medicare HMO | Admitting: Cardiology

## 2019-04-15 ENCOUNTER — Ambulatory Visit: Payer: BC Managed Care – PPO | Admitting: Cardiology

## 2019-06-16 DIAGNOSIS — I10 Essential (primary) hypertension: Secondary | ICD-10-CM | POA: Diagnosis not present

## 2019-06-16 DIAGNOSIS — E785 Hyperlipidemia, unspecified: Secondary | ICD-10-CM | POA: Diagnosis not present

## 2019-06-16 DIAGNOSIS — G720 Drug-induced myopathy: Secondary | ICD-10-CM | POA: Diagnosis not present

## 2019-06-16 DIAGNOSIS — F411 Generalized anxiety disorder: Secondary | ICD-10-CM | POA: Diagnosis not present

## 2019-06-16 DIAGNOSIS — E1169 Type 2 diabetes mellitus with other specified complication: Secondary | ICD-10-CM | POA: Diagnosis not present

## 2019-07-14 IMAGING — CR DG CHEST 2V
2 series · 2 of 2 positions shown · non-contrast
Comparison: 09/24/2016

CLINICAL DATA: C/o elevated BP, headache, chest pain, some sob,
left arm swelling.

EXAM:
CHEST  2 VIEW

[w chest pa]
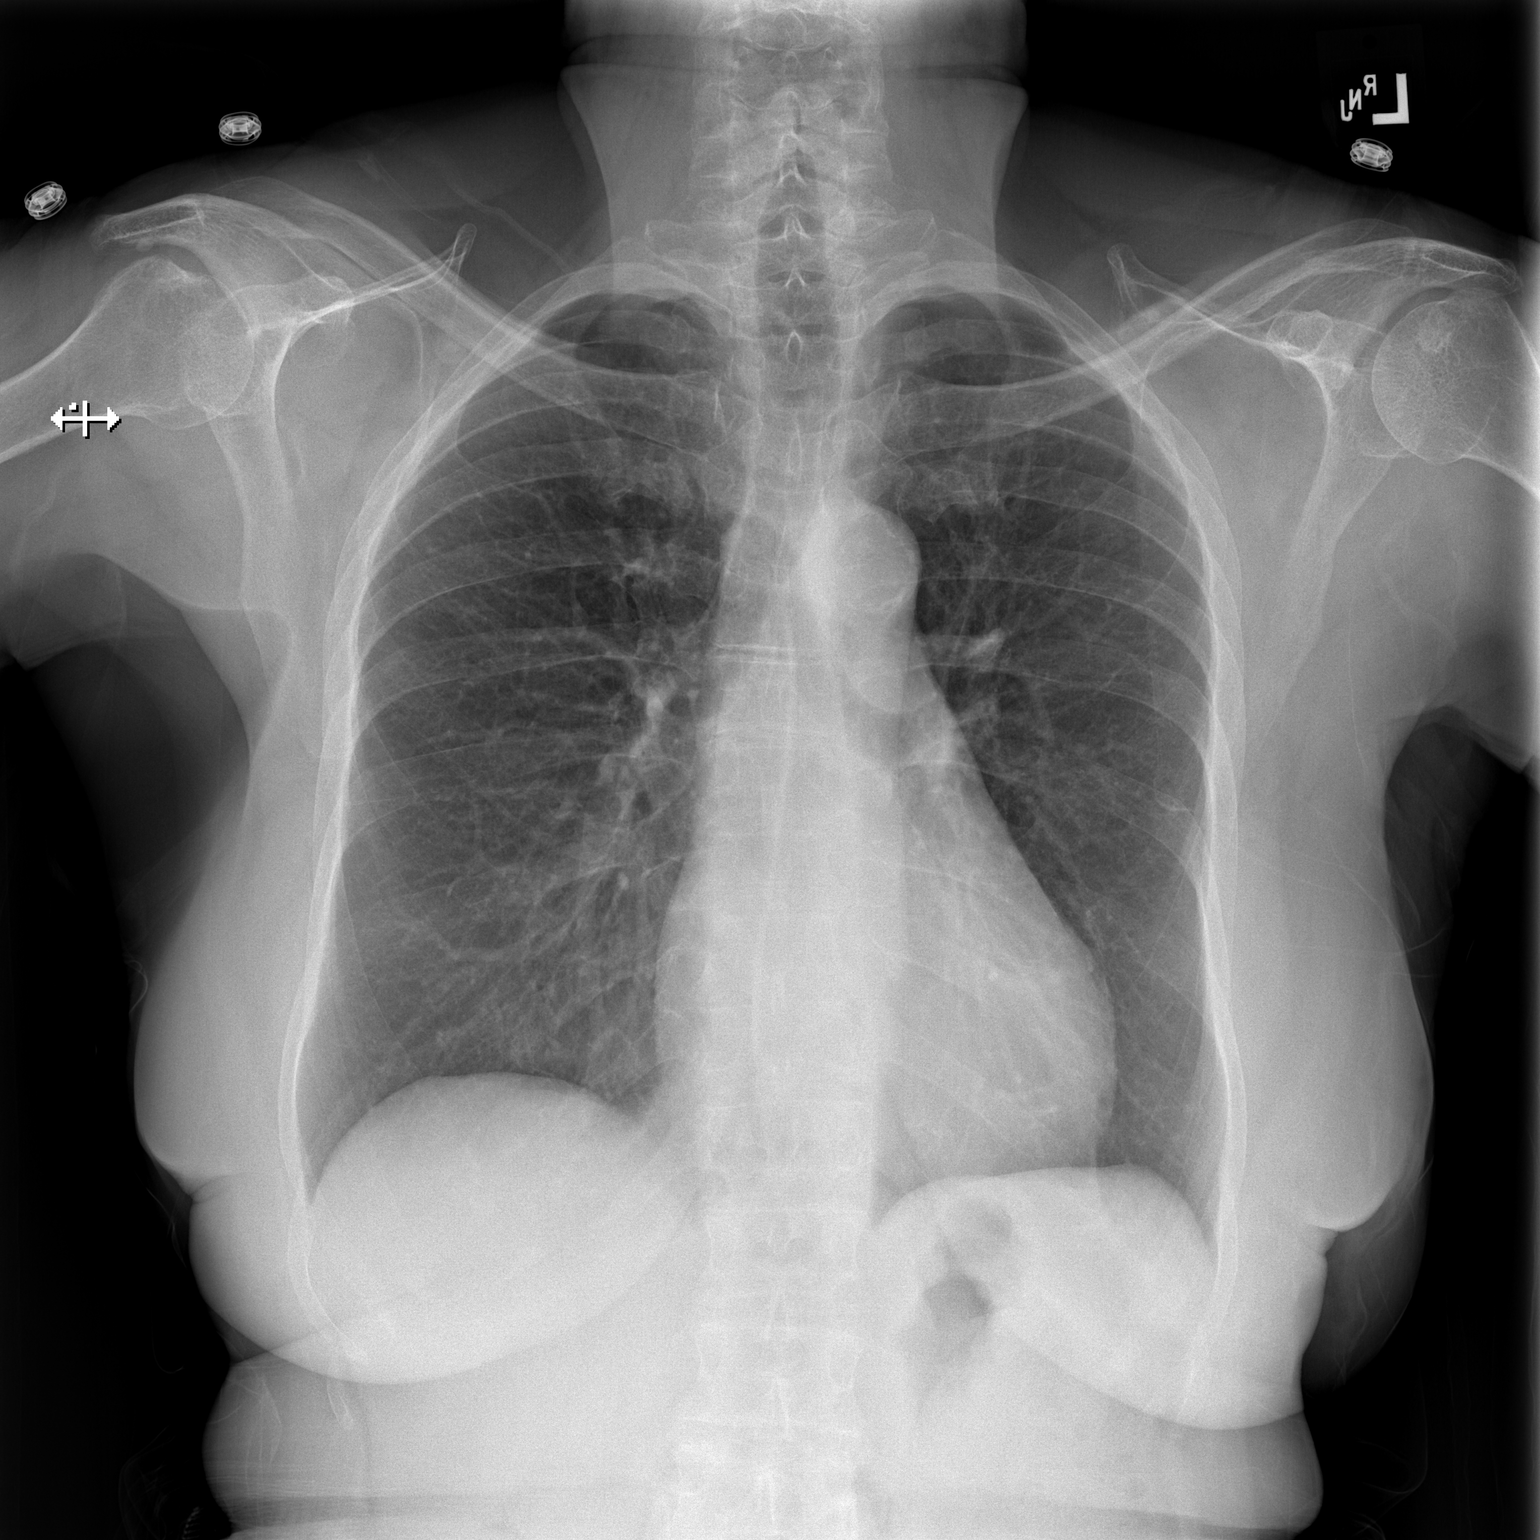

[w chest lat]
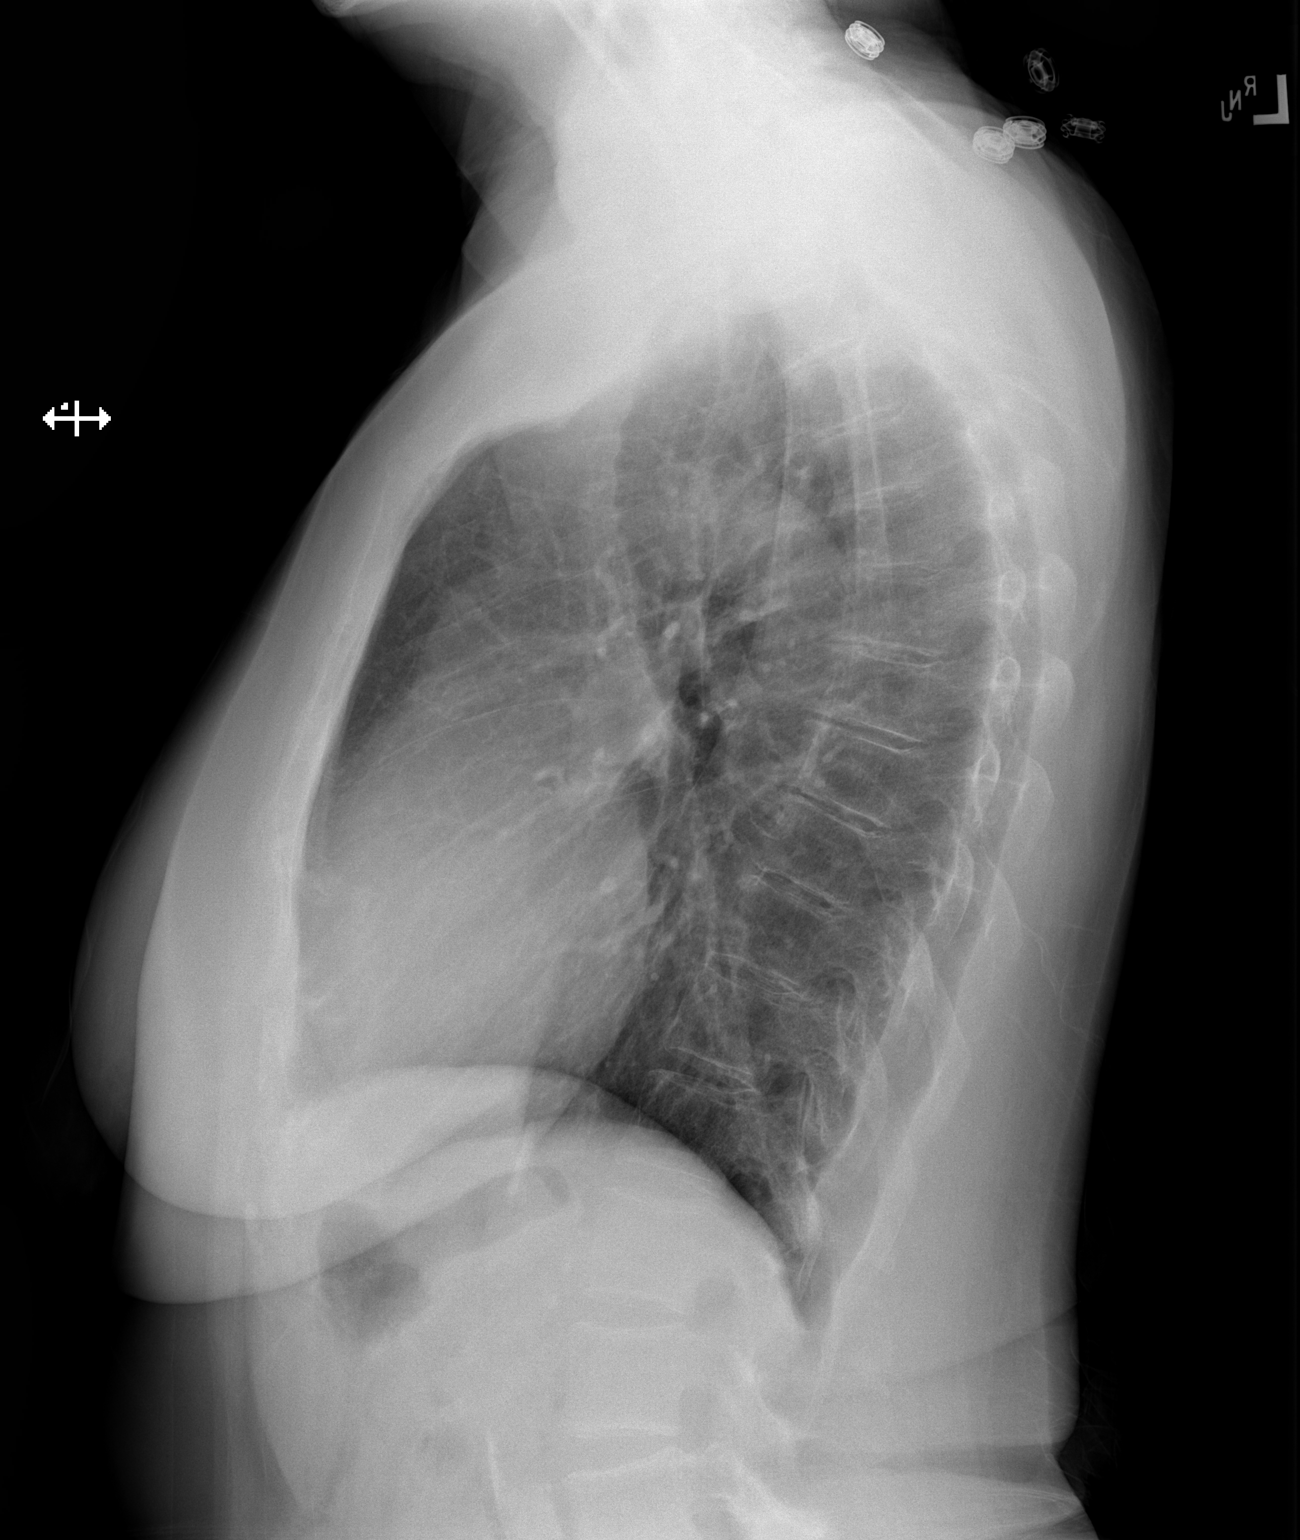

[2 of 2 positions shown; findings below may reference images not displayed]

FINDINGS: Midline trachea. Normal heart size and mediastinal contours.
Atherosclerosis in the transverse aorta. No pleural effusion or
pneumothorax. Clear lungs.
IMPRESSION: No acute cardiopulmonary disease.

Aortic Atherosclerosis ([id number]-P4S.S).

## 2019-10-25 ENCOUNTER — Telehealth: Payer: Self-pay

## 2019-10-25 ENCOUNTER — Other Ambulatory Visit: Payer: Self-pay

## 2019-10-25 MED ORDER — NEBIVOLOL HCL 10 MG PO TABS: 10.0000 mg | ORAL_TABLET | Freq: Every day | ORAL | 0 refills | Status: DC

## 2019-10-25 NOTE — Telephone Encounter (Signed)
Increase Bystolic to 10 mg daily. Please send new prescription. Please arrange f/u with me in 3-4 weeks.  Thanks MJP

## 2019-10-25 NOTE — Telephone Encounter (Signed)
Yes I made sure to go over meds and she said she is taking them.

## 2019-10-25 NOTE — Telephone Encounter (Signed)
Telephone encounter:  Reason for call: Tachycardia/High BP, pt also states she is not eating because everything tastes gross to her. BP is 179/90, HR is 108, med list was updated with Pt   Usual provider: MP  Last office visit: 02/17/19  Next office visit: NA   Last hospitalization:    Current Outpatient Medications on File Prior to Visit  Medication Sig Dispense Refill  . amLODipine (NORVASC) 2.5 MG tablet Take 5 mg by mouth 2 (two) times daily.     [name] aspirin 81 MG tablet Take 81 mg by mouth daily as needed (for pain and pressure).     . BYSTOLIC 5 MG tablet Take 5 mg by mouth daily.    . cetirizine (ZYRTEC) 10 MG tablet Take 10 mg by mouth daily as needed for allergies.     [name] docusate sodium (COLACE) 100 MG capsule Take 100 mg by mouth daily as needed for mild constipation.    [name] EPINEPHrine 0.3 mg/0.3 mL IJ SOAJ injection Inject 0.3 mg into the muscle once as needed (for anaphylaxis).   1  . LORazepam (ATIVAN) 1 MG tablet Take 0.5 tablets (0.5 mg total) by mouth every 8 (eight) hours as needed for anxiety. 10 tablet 0  . Magnesium Hydroxide (MILK OF MAGNESIA PO) Take 15 mLs by mouth daily as needed (CONSTIPATION).     No current facility-administered medications on file prior to visit.

## 2019-10-25 NOTE — Telephone Encounter (Signed)
S/w pt advised her to increase bystolic to 10 mg, sent new prescription in

## 2019-10-26 DIAGNOSIS — I10 Essential (primary) hypertension: Secondary | ICD-10-CM | POA: Diagnosis not present

## 2019-10-26 DIAGNOSIS — R350 Frequency of micturition: Secondary | ICD-10-CM | POA: Diagnosis not present

## 2019-10-26 DIAGNOSIS — R000 Tachycardia, unspecified: Secondary | ICD-10-CM | POA: Diagnosis not present

## 2019-10-26 DIAGNOSIS — E1169 Type 2 diabetes mellitus with other specified complication: Secondary | ICD-10-CM | POA: Diagnosis not present

## 2019-10-26 DIAGNOSIS — K219 Gastro-esophageal reflux disease without esophagitis: Secondary | ICD-10-CM | POA: Diagnosis not present

## 2019-11-15 ENCOUNTER — Other Ambulatory Visit: Payer: Self-pay

## 2019-11-15 ENCOUNTER — Encounter: Payer: Self-pay | Admitting: Cardiology

## 2019-11-15 ENCOUNTER — Ambulatory Visit (INDEPENDENT_AMBULATORY_CARE_PROVIDER_SITE_OTHER): Payer: Medicare HMO | Admitting: Cardiology

## 2019-11-15 VITALS — BP 154/63 | HR 61 | Temp 97.3°F | Ht 64.0 in | Wt 133.6 lb

## 2019-11-15 DIAGNOSIS — R072 Precordial pain: Secondary | ICD-10-CM

## 2019-11-15 DIAGNOSIS — I10 Essential (primary) hypertension: Secondary | ICD-10-CM | POA: Diagnosis not present

## 2019-11-15 DIAGNOSIS — E782 Mixed hyperlipidemia: Secondary | ICD-10-CM

## 2019-11-15 DIAGNOSIS — Z91041 Radiographic dye allergy status: Secondary | ICD-10-CM

## 2019-11-15 MED ORDER — PREDNISONE 50 MG PO TABS: ORAL_TABLET | ORAL | 0 refills | Status: DC

## 2019-11-15 MED ORDER — DIPHENHYDRAMINE HCL 50 MG PO TABS: 50.0000 mg | ORAL_TABLET | Freq: Every evening | ORAL | 0 refills | Status: DC | PRN

## 2019-11-15 MED ORDER — AMLODIPINE BESYLATE 10 MG PO TABS: 10.0000 mg | ORAL_TABLET | Freq: Every day | ORAL | 3 refills | Status: DC

## 2019-11-15 NOTE — Progress Notes (Signed)
Patient is here for follow up visit.  Subjective:   @Patient  ID: Donna Baldwin, female    DOB: Mar 09, 1941, 78 y.o.   MRN: [id number]  Chief Complaint  Patient presents with  . Hypertension  . Hyperlipidemia  . Follow-up    3-4 week    HPI   78 year old African-American female with labile hypertension, asymptomatic bradycardia, type II diabetes mellitus, chronic dysphagia, interstitial cystitis.  She continues to have episodes of chest pain, usually unrelated to exertion.  Blood pressure remains uncontrolled.  Resting heart rate in the 40s.  Past Medical History:  Diagnosis Date  . Anemia   . Anxiety   . Atherosclerosis   . Back pain   . Coronary artery disease   . GERD (gastroesophageal reflux disease)   . HTN (hypertension) 12/03/2013   CT scan-no pheochromocytoma  . Hyperlipidemia   . Hypertension   . Hypertensive heart disease without heart failure   . Thrombocytopenia ([place])   . Vitamin D deficiency     Past Surgical History:  Procedure Laterality Date  . EYE SURGERY    . SHOULDER SURGERY Bilateral   . TONSILLECTOMY      Social History   Socioeconomic History  . Marital status: Married    Spouse name: Not on file  . Number of children: Not on file  . Years of education: Not on file  . Highest education level: Not on file  Occupational History  . Not on file  Social Needs  . Financial resource strain: Not on file  . Food insecurity    Worry: Not on file    Inability: Not on file  . Transportation needs    Medical: Not on file    Non-medical: Not on file  Tobacco Use  . Smoking status: Never Smoker  . Smokeless tobacco: Never Used  Substance and Sexual Activity  . Alcohol use: No    Alcohol/week: 0.0 standard drinks  . Drug use: No  . Sexual activity: Never  Lifestyle  . Physical activity    Days per week: Not on file    Minutes per session: Not on file  . Stress: Not on file  Relationships  . Social Herbalist on  phone: Not on file    Gets together: Not on file    Attends religious service: Not on file    Active member of club or organization: Not on file    Attends meetings of clubs or organizations: Not on file    Relationship status: Not on file  . Intimate partner violence    Fear of current or ex partner: Not on file    Emotionally abused: Not on file    Physically abused: Not on file    Forced sexual activity: Not on file  Other Topics Concern  . Not on file  Social History Narrative  . Not on file    Current Outpatient Medications on File Prior to Visit  Medication Sig Dispense Refill  . amLODipine (NORVASC) 2.5 MG tablet Take 5 mg by mouth 2 (two) times daily.     [name] aspirin 81 MG tablet Take 81 mg by mouth daily as needed (for pain and pressure).     . cetirizine (ZYRTEC) 10 MG tablet Take 10 mg by mouth daily as needed for allergies.     [name] docusate sodium (COLACE) 100 MG capsule Take 100 mg by mouth daily as needed for mild constipation.    [name] EPINEPHrine 0.3 mg/0.3  mL IJ SOAJ injection Inject 0.3 mg into the muscle once as needed (for anaphylaxis).   1  . LORazepam (ATIVAN) 1 MG tablet Take 0.5 tablets (0.5 mg total) by mouth every 8 (eight) hours as needed for anxiety. 10 tablet 0  . Magnesium Hydroxide (MILK OF MAGNESIA PO) Take 15 mLs by mouth daily as needed (CONSTIPATION).    [name] nebivolol (BYSTOLIC) 10 MG tablet Take 1 tablet (10 mg total) by mouth daily. 90 tablet 0   No current facility-administered medications on file prior to visit.     Cardiovascular studies:  Event monitor 01/07/2019- 02/05/2019: Sinus bradycardia, lowest HR 32 bpm @ 3:55 AM Occasional PVC's correlate with symptoms of fatigue. Highest HR 109 bpm.  Exercise Treadmill Stress Test 10/23/2018:  Indication: Bradycardia The patient exercised on Bruce protocol for 06:17 min. Patient achieved 7.50 METS and reached HR 110 bpm, which is 76 % of maximum age-predicted HR. Stress test terminated due to  fatigue.   Exercise capacity was below average for age. HR Response to Exercise: Sub-optimal secondary to limited exercise capacity BP Response to Exercise: Normal resting BP- appropriate response. Chest Pain: none. Arrhythmias: Occasional PVCs. Resting EKG demonstrates Normal sinus rhythm. ST Changes: With peak exercise there was no ST-T changes of ischemia.  Overall Impression: Submaximal stress test. Attenuated heart rate response without hypotension, lightheadedness Recommendations: Continue primary/secondary prevention. Consider alternate testing, if clinical suspicion for obstructive CAD is high.  Outside echocardiogram 07/06/2018: - Left ventricle: The cavity size was normal. Wall thickness was  normal. Systolic function was normal. The estimated ejection  fraction was in the range of 55% to 60%. Wall motion was normal;  there were no regional wall motion abnormalities. Doppler  parameters are consistent with abnormal left ventricular  relaxation (grade 1 diastolic dysfunction). - Left atrium: The atrium was mildly dilated. - Right atrium: The atrium was mildly dilated.   Impressions:   - Normal LV systolic function; mild diastolic dysfunction; mild  biatrial enlargement.  CT abdomen 06/2016: IMPRESSION: Nonspecific mild wall thickening of the distal stomach and duodenum can be seen with chronic gastritis or duodenitis. No evidence of bowel obstruction or perforation. Negative for abscess.  No other acute intra-abdominal or pelvic process by noncontrast CT. Aortoiliac atherosclerosis  Review of Systems  Constitution: Positive for malaise/fatigue and night sweats. Negative for decreased appetite, weight gain and weight loss.  HENT: Negative for congestion.   Eyes: Negative for visual disturbance.  Cardiovascular: Positive for irregular heartbeat and palpitations. Negative for chest pain, dyspnea on exertion, leg swelling and syncope.  Respiratory: Negative for  shortness of breath.   Endocrine: Negative for cold intolerance.  Hematologic/Lymphatic: Does not bruise/bleed easily.  Skin: Negative for itching and rash.  Musculoskeletal: Negative for myalgias.  Gastrointestinal: Negative for abdominal pain, nausea and vomiting.  Genitourinary: Negative for dysuria.  Neurological: Positive for headaches. Negative for dizziness and weakness. Light-headedness: increased in headaches. Admits to 1 headache everyday last week.  Psychiatric/Behavioral: The patient is not nervous/anxious.   All other systems reviewed and are negative.      Objective:     Vitals:   11/15/19 1444 11/15/19 1614  BP: (!) 199/87 (!) 154/63  Pulse: (!) 52 61  Temp: (!) 97.3 F (36.3 C)   SpO2: 99%      Physical Exam  Constitutional: She is oriented to person, place, and time. She appears well-developed and well-nourished. No distress.  HENT:  Head: Normocephalic and atraumatic.  Eyes: Pupils are equal, round, and reactive  to light. Conjunctivae are normal.  Neck: No JVD present.  Cardiovascular: Normal rate, regular rhythm and intact distal pulses.  Pulmonary/Chest: Effort normal and breath sounds normal. She has no wheezes. She has no rales.  Abdominal: Soft. Bowel sounds are normal. There is no rebound.  Musculoskeletal:        General: No edema.  Lymphadenopathy:    She has no cervical adenopathy.  Neurological: She is alert and oriented to person, place, and time. No cranial nerve deficit.  Skin: Skin is warm and dry.  Psychiatric: She has a normal mood and affect.  Nursing note and vitals reviewed.       Assessment & Recommendations:   78 year old African-American female with labile hypertension, asymptomatic bradycardia, type II diabetes mellitus, chronic dysphagia, interstitial cystitis.  1. Essential hypertension: Uncontrolled. Currently on amlodipine 5 mg twice daily, bisoprolol 10 mg daily.  She has not been able to tolerate several other  medications, including BiDil. Increase amlodipine to 10 mg daily.  2. Mixed hyperlipidemia: Intolerant to several different statins. Does not want to try PCSK9 inhibitor at this time.  3. Chest pain: Atypical.  Inconclusive treadmill stress test due to inadequate heart rate response in the past.  Recommend coronary CT angiogram. She has allergy to contrast dye media.  Prednisone, Benadryl ordered.  F/u after CTA.   Nigel Mormon, MD [facility] Pager: [phone] Office: [phone] If no answer Cell [phone]

## 2019-11-15 NOTE — Patient Instructions (Signed)
Your cardiac CT will be scheduled at the locations below:   [facility]  [address]  [place], [place] [address]  [phone]    If scheduled at [facility], please arrive at the [facility] main entrance of [facility] 30-45 minutes prior to test start time.  Proceed to the [facility] Radiology Department (first floor) to check-in and test prep.   Please follow these instructions carefully (unless otherwise directed):   On the Night Before the Test:   Be sure to Drink plenty of water.   Do not consume any caffeinated/decaffeinated beverages or chocolate 12 hours prior to your test.   Do not take any antihistamines 12 hours prior to your test.   If the patient has contrast allergy:  Patient will need a prescription for Prednisone and very clear instructions (as follows):  1. Prednisone 50 mg - take 13 hours prior to test  2. Take another Prednisone 50 mg 7 hours prior to test  3. Take another Prednisone 50 mg 1 hour prior to test  4. Take Benadryl 50 mg 1 hour prior to test   Patient must complete all four doses of above prophylactic medications.   Patient will need a ride after test due to Benadryl.   On the Day of the Test:   Drink plenty of water. Do not drink any water within one hour of the test.   Do not eat any food 4 hours prior to the test.   You may take your regular medications prior to the test.    FEMALES- please wear underwire-free bra if available          After the Test:   Drink plenty of water.   After receiving IV contrast, you may experience a mild flushed feeling. This is normal.   On occasion, you may experience a mild rash up to 24 hours after the test. This is not dangerous. If this occurs, you can take Benadryl 25 mg and increase your fluid intake.   If you experience trouble breathing, this can be serious. If it is severe call [phone] IMMEDIATELY. If it is mild, please call our office.   If you take any of  these medications: Glipizide/Metformin, Avandament, Glucavance, please do not take 48 hours after completing test unless otherwise instructed.     Please contact the cardiac imaging nurse navigator should you have any questions/concerns  Marchia Bond, RN Navigator Cardiac Imaging  [facility]  [phone] Office  [phone] Cell

## 2019-11-18 ENCOUNTER — Telehealth: Payer: Self-pay

## 2019-11-18 NOTE — Telephone Encounter (Signed)
Spoke with patient.

## 2019-11-18 NOTE — Telephone Encounter (Signed)
Telephone encounter:  Reason for call: Pt wants you to call her about her test she has coming up she has concerns and questions [phone]  Usual provider: MP   Last office visit: 11/15/2019  Next office visit: 12/20/2019   Last hospitalization:    Current Outpatient Medications on File Prior to Visit  Medication Sig Dispense Refill  . amLODipine (NORVASC) 10 MG tablet Take 1 tablet (10 mg total) by mouth daily. 30 tablet 3  . aspirin 81 MG tablet Take 81 mg by mouth daily as needed (for pain and pressure).     . cetirizine (ZYRTEC) 10 MG tablet Take 10 mg by mouth daily as needed for allergies.     . diphenhydrAMINE (BENADRYL) 50 MG tablet Take 1 tablet (50 mg total) by mouth at bedtime as needed for itching. To be taken 1 hr before the CT scan 1 tablet 0  . EPINEPHrine 0.3 mg/0.3 mL IJ SOAJ injection Inject 0.3 mg into the muscle once as needed (for anaphylaxis).   1  . LORazepam (ATIVAN) 1 MG tablet Take 0.5 tablets (0.5 mg total) by mouth every 8 (eight) hours as needed for anxiety. 10 tablet 0  . Magnesium Hydroxide (MILK OF MAGNESIA PO) Take 15 mLs by mouth daily as needed (CONSTIPATION).    [name] nebivolol (BYSTOLIC) 10 MG tablet Take 1 tablet (10 mg total) by mouth daily. 90 tablet 0  . omeprazole (PRILOSEC) 40 MG capsule Take 40 mg by mouth daily.    . predniSONE (DELTASONE) 50 MG tablet To be takes 13 hrs, 7 hrs, and 1 hr before your CT scan 3 tablet 0   No current facility-administered medications on file prior to visit.

## 2019-11-22 ENCOUNTER — Telehealth: Payer: Self-pay

## 2019-11-22 ENCOUNTER — Other Ambulatory Visit: Payer: Self-pay | Admitting: Cardiology

## 2019-11-22 DIAGNOSIS — R072 Precordial pain: Secondary | ICD-10-CM

## 2019-11-22 NOTE — Telephone Encounter (Signed)
Telephone encounter:  Reason for call: Pt called wanting to let you know her PCP put her on prevacid 40mg  and she wants to make sure that's ok with you and doesn't interfere with any of her meds  Usual provider: MP  Last office visit: 11/15/2019  Next office visit: 12/20/2019   Last hospitalization:    Current Outpatient Medications on File Prior to Visit  Medication Sig Dispense Refill  . amLODipine (NORVASC) 10 MG tablet Take 1 tablet (10 mg total) by mouth daily. 30 tablet 3  . aspirin 81 MG tablet Take 81 mg by mouth daily as needed (for pain and pressure).     . cetirizine (ZYRTEC) 10 MG tablet Take 10 mg by mouth daily as needed for allergies.     . diphenhydrAMINE (BENADRYL) 50 MG tablet Take 1 tablet (50 mg total) by mouth at bedtime as needed for itching. To be taken 1 hr before the CT scan 1 tablet 0  . EPINEPHrine 0.3 mg/0.3 mL IJ SOAJ injection Inject 0.3 mg into the muscle once as needed (for anaphylaxis).   1  . LORazepam (ATIVAN) 1 MG tablet Take 0.5 tablets (0.5 mg total) by mouth every 8 (eight) hours as needed for anxiety. 10 tablet 0  . Magnesium Hydroxide (MILK OF MAGNESIA PO) Take 15 mLs by mouth daily as needed (CONSTIPATION).    [name] nebivolol (BYSTOLIC) 10 MG tablet Take 1 tablet (10 mg total) by mouth daily. 90 tablet 0  . omeprazole (PRILOSEC) 40 MG capsule Take 40 mg by mouth daily.    . predniSONE (DELTASONE) 50 MG tablet To be takes 13 hrs, 7 hrs, and 1 hr before your CT scan 3 tablet 0   No current facility-administered medications on file prior to visit.

## 2019-11-22 NOTE — Telephone Encounter (Signed)
Pt aware.

## 2019-11-22 NOTE — Telephone Encounter (Signed)
Ok to use?

## 2019-11-24 DIAGNOSIS — R072 Precordial pain: Secondary | ICD-10-CM | POA: Diagnosis not present

## 2019-11-25 LAB — BASIC METABOLIC PANEL
BUN/Creatinine Ratio: 13 (ref 12–28)
BUN: 13 mg/dL (ref 8–27)
CO2: 23 mmol/L (ref 20–29)
Calcium: 9.7 mg/dL (ref 8.7–10.3)
Chloride: 105 mmol/L (ref 96–106)
Creatinine, Ser: 0.98 mg/dL (ref 0.57–1.00)
GFR calc Af Amer: 64 mL/min/{1.73_m2} (ref >59)
GFR calc non Af Amer: 55 mL/min/{1.73_m2} — ABNORMAL LOW (ref >59)
Glucose: 104 mg/dL — ABNORMAL HIGH (ref 65–99)
Potassium: 4.8 mmol/L (ref 3.5–5.2)
Sodium: 144 mmol/L (ref 134–144)

## 2019-12-07 ENCOUNTER — Telehealth (HOSPITAL_COMMUNITY): Payer: Self-pay | Admitting: Emergency Medicine

## 2019-12-07 ENCOUNTER — Telehealth: Payer: Self-pay

## 2019-12-07 NOTE — Telephone Encounter (Signed)
Telephone encounter:  Reason for call: The patient called and asked if she is to take the 1 benadryl prior to her CT scan. She was confuused by the directions.  Usual provider: MP  Last office visit: 11/15/19  Next office visit: 12/20/19   Last hospitalization: NA   Current Outpatient Medications on File Prior to Visit  Medication Sig Dispense Refill  . amLODipine (NORVASC) 10 MG tablet Take 1 tablet (10 mg total) by mouth daily. 30 tablet 3  . aspirin 81 MG tablet Take 81 mg by mouth daily as needed (for pain and pressure).     . cetirizine (ZYRTEC) 10 MG tablet Take 10 mg by mouth daily as needed for allergies.     . diphenhydrAMINE (BENADRYL) 50 MG tablet Take 1 tablet (50 mg total) by mouth at bedtime as needed for itching. To be taken 1 hr before the CT scan 1 tablet 0  . EPINEPHrine 0.3 mg/0.3 mL IJ SOAJ injection Inject 0.3 mg into the muscle once as needed (for anaphylaxis).   1  . LORazepam (ATIVAN) 1 MG tablet Take 0.5 tablets (0.5 mg total) by mouth every 8 (eight) hours as needed for anxiety. 10 tablet 0  . Magnesium Hydroxide (MILK OF MAGNESIA PO) Take 15 mLs by mouth daily as needed (CONSTIPATION).    [name] nebivolol (BYSTOLIC) 10 MG tablet Take 1 tablet (10 mg total) by mouth daily. 90 tablet 0  . omeprazole (PRILOSEC) 40 MG capsule Take 40 mg by mouth daily.    . predniSONE (DELTASONE) 50 MG tablet To be takes 13 hrs, 7 hrs, and 1 hr before your CT scan 3 tablet 0   No current facility-administered medications on file prior to visit.

## 2019-12-07 NOTE — Telephone Encounter (Signed)
Reaching out to patient to offer assistance regarding upcoming cardiac imaging study; pt verbalizes understanding of appt date/time, parking situation and where to check in, pre-test NPO status and medications ordered, and verified current allergies; name and call back number provided for further questions should they arise Marchia Bond RN Navigator Cardiac Imaging [facility] [phone] office [phone] cell  Patient and I thoroughly reviewed medication schedule regarding contrast allergy prep meds. Pt verbalized understanding. No further questions

## 2019-12-07 NOTE — Telephone Encounter (Signed)
Yes

## 2019-12-08 ENCOUNTER — Ambulatory Visit (HOSPITAL_COMMUNITY)
Admission: RE | Admit: 2019-12-08 | Discharge: 2019-12-08 | Disposition: A | Discharge status: Home / Self Care | Payer: Medicare HMO | Source: Ambulatory Visit | Attending: Cardiology | Admitting: Cardiology

## 2019-12-08 ENCOUNTER — Other Ambulatory Visit: Payer: Self-pay

## 2019-12-08 ENCOUNTER — Ambulatory Visit (HOSPITAL_COMMUNITY): Payer: Medicare HMO

## 2019-12-08 DIAGNOSIS — R072 Precordial pain: Secondary | ICD-10-CM | POA: Diagnosis not present

## 2019-12-08 MED ORDER — NITROGLYCERIN 0.4 MG SL SUBL
SUBLINGUAL_TABLET | SUBLINGUAL | Status: AC
  Filled 2019-12-08: qty 2

## 2019-12-08 MED ORDER — IOHEXOL 350 MG/ML SOLN
80.0000 mL | Freq: Once | INTRAVENOUS | Status: AC | PRN
  Administered 2019-12-08: 09:00:00 80 mL via INTRAVENOUS

## 2019-12-08 MED ORDER — NITROGLYCERIN 0.4 MG SL SUBL
0.8000 mg | SUBLINGUAL_TABLET | Freq: Once | SUBLINGUAL | Status: AC
  Administered 2019-12-08: 0.8 mg via SUBLINGUAL

## 2019-12-08 MED ORDER — METOPROLOL TARTRATE 5 MG/5ML IV SOLN
INTRAVENOUS | Status: AC
  Administered 2019-12-08: 5 mg
  Filled 2019-12-08: qty 25

## 2019-12-08 MED ORDER — METOPROLOL TARTRATE 5 MG/5ML IV SOLN: 5.0000 mg | INTRAVENOUS | Status: DC | PRN

## 2019-12-08 NOTE — Progress Notes (Signed)
Conversations with [facility] pharmacy yesterday about patients numerous allergies/intolerances, pharmacist suggested that if patient is being pre-medicated for contrast allergy, then this should mitigate allergic reactions to other substances like nitrates.   Confirmed with Patwardhan that CCTA was indeed the test he wanted to pursue for this patient; he confirmed yes.  Conversation with Aundra Dubin this morning about patients hesitation to take SL nitroglycerin with known intolerance to organic nitrates. Re-confirmed that shes been pre-medicated for allergic reactions. And since she has done all the pre-work thus far, should be okay to continue test. A test without nitro is insufficient/non-diagnostic  Marchia Bond RN Navigator Cardiac Imaging [facility] [phone] Office  [phone] Cell

## 2019-12-08 NOTE — Progress Notes (Signed)
[facility] notified of patient allergy list. Aundra Dubin, MD believes this test is still safe to proceed with despite allergies to the various medications needed to be given based on patient premedicating with prednisone and benadryl.

## 2019-12-17 ENCOUNTER — Ambulatory Visit: Payer: Medicare HMO | Admitting: Cardiology

## 2019-12-17 NOTE — Progress Notes (Signed)
Patient is here for follow up visit.  Subjective:   @Patient  ID: Donna Baldwin, female    DOB: May 09, 1941, 78 y.o.   MRN: [id number]  Chief Complaint  Patient presents with  . Hypertension    HPI   78 year old African-American female with labile hypertension, asymptomatic bradycardia, type II diabetes mellitus, chronic dysphagia, interstitial cystitis.  CTA showed mild nonobstructive CAD. BP elevated today, but much lower at home. She denies any chest pain at this time. She has not had lipid panel in a long time. She has tried several statins and not tolerated any.   She is extremely worried about incidental liver findings on recent coronary CTA. She requests GI referral.   Past Medical History:  Diagnosis Date  . Acid reflux   . Anemia   . Anxiety   . Atherosclerosis   . Back pain   . Coronary artery disease   . GERD (gastroesophageal reflux disease)   . HTN (hypertension) 12/03/2013   CT scan-no pheochromocytoma  . Hyperlipidemia   . Hypertension   . Hypertensive heart disease without heart failure   . Thrombocytopenia ([place])   . Vitamin D deficiency     Past Surgical History:  Procedure Laterality Date  . EYE SURGERY    . SHOULDER SURGERY Bilateral   . TONSILLECTOMY      Social History   Socioeconomic History  . Marital status: Married    Spouse name: Not on file  . Number of children: 2  . Years of education: Not on file  . Highest education level: Not on file  Occupational History  . Not on file  Tobacco Use  . Smoking status: Never Smoker  . Smokeless tobacco: Never Used  Substance and Sexual Activity  . Alcohol use: No    Alcohol/week: 0.0 standard drinks  . Drug use: No  . Sexual activity: Never  Other Topics Concern  . Not on file  Social History Narrative  . Not on file   Social Determinants of Health   Financial Resource Strain:   . Difficulty of Paying Living Expenses: Not on file  Food Insecurity:   . Worried About Paediatric nurse in the Last Year: Not on file  . Ran Out of Food in the Last Year: Not on file  Transportation Needs:   . Lack of Transportation (Medical): Not on file  . Lack of Transportation (Non-Medical): Not on file  Physical Activity:   . Days of Exercise per Week: Not on file  . Minutes of Exercise per Session: Not on file  Stress:   . Feeling of Stress : Not on file  Social Connections:   . Frequency of Communication with Friends and Family: Not on file  . Frequency of Social Gatherings with Friends and Family: Not on file  . Attends Religious Services: Not on file  . Active Member of Clubs or Organizations: Not on file  . Attends Archivist Meetings: Not on file  . Marital Status: Not on file  Intimate Partner Violence:   . Fear of Current or Ex-Partner: Not on file  . Emotionally Abused: Not on file  . Physically Abused: Not on file  . Sexually Abused: Not on file    Current Outpatient Medications on File Prior to Visit  Medication Sig Dispense Refill  . amLODipine (NORVASC) 10 MG tablet Take 1 tablet (10 mg total) by mouth daily. 30 tablet 3  . aspirin 81 MG tablet Take 81 mg by  mouth daily as needed (for pain and pressure).     . cetirizine (ZYRTEC) 10 MG tablet Take 10 mg by mouth daily as needed for allergies.     . diphenhydrAMINE (BENADRYL) 50 MG tablet Take 1 tablet (50 mg total) by mouth at bedtime as needed for itching. To be taken 1 hr before the CT scan 1 tablet 0  . EPINEPHrine 0.3 mg/0.3 mL IJ SOAJ injection Inject 0.3 mg into the muscle once as needed (for anaphylaxis).   1  . LORazepam (ATIVAN) 1 MG tablet Take 0.5 tablets (0.5 mg total) by mouth every 8 (eight) hours as needed for anxiety. 10 tablet 0  . Magnesium Hydroxide (MILK OF MAGNESIA PO) Take 15 mLs by mouth daily as needed (CONSTIPATION).    [name] nebivolol (BYSTOLIC) 10 MG tablet Take 1 tablet (10 mg total) by mouth daily. 90 tablet 0  . omeprazole (PRILOSEC) 40 MG capsule Take 40 mg by mouth  daily.    . predniSONE (DELTASONE) 50 MG tablet To be takes 13 hrs, 7 hrs, and 1 hr before your CT scan 3 tablet 0   No current facility-administered medications on file prior to visit.    Cardiovascular studies:  CTA 12/08/2019: 1. Coronary artery calcium score 108 Agatston units. This places the patient in the 54th percentile for age and gender, suggesting intermediate risk for future cardiac events. 2.  Nonobstructive ostial left main calcified plaque.  Upper Abdomen: Scattered foci of hepatic dome arterial hyperenhancement are diminutive and likely small perfusion anomalies. Normal imaged portions of the spleen, stomach.  Musculoskeletal: Thoracic spondylosis.  IMPRESSION: 1.  No acute findings in the imaged extracardiac chest. 2.  Aortic Atherosclerosis (ICD10-I70.0). 3. Esophageal air fluid level suggests dysmotility or gastroesophageal reflux.   Event monitor 01/07/2019- 02/05/2019: Sinus bradycardia, lowest HR 32 bpm @ 3:55 AM Occasional PVC's correlate with symptoms of fatigue. Highest HR 109 bpm.  Exercise Treadmill Stress Test 10/23/2018:  Indication: Bradycardia The patient exercised on Bruce protocol for 06:17 min. Patient achieved 7.50 METS and reached HR 110 bpm, which is 76 % of maximum age-predicted HR. Stress test terminated due to fatigue.   Exercise capacity was below average for age. HR Response to Exercise: Sub-optimal secondary to limited exercise capacity BP Response to Exercise: Normal resting BP- appropriate response. Chest Pain: none. Arrhythmias: Occasional PVCs. Resting EKG demonstrates Normal sinus rhythm. ST Changes: With peak exercise there was no ST-T changes of ischemia.  Overall Impression: Submaximal stress test. Attenuated heart rate response without hypotension, lightheadedness Recommendations: Continue primary/secondary prevention. Consider alternate testing, if clinical suspicion for obstructive CAD is high.  Outside  echocardiogram 07/06/2018: - Left ventricle: The cavity size was normal. Wall thickness was  normal. Systolic function was normal. The estimated ejection  fraction was in the range of 55% to 60%. Wall motion was normal;  there were no regional wall motion abnormalities. Doppler  parameters are consistent with abnormal left ventricular  relaxation (grade 1 diastolic dysfunction). - Left atrium: The atrium was mildly dilated. - Right atrium: The atrium was mildly dilated.   Impressions:   - Normal LV systolic function; mild diastolic dysfunction; mild  biatrial enlargement.  CT abdomen 06/2016: IMPRESSION: Nonspecific mild wall thickening of the distal stomach and duodenum can be seen with chronic gastritis or duodenitis. No evidence of bowel obstruction or perforation. Negative for abscess.  No other acute intra-abdominal or pelvic process by noncontrast CT. Aortoiliac atherosclerosis  Review of Systems  Constitution: Positive for malaise/fatigue and night sweats.  Negative for decreased appetite, weight gain and weight loss.  HENT: Negative for congestion.   Eyes: Negative for visual disturbance.  Cardiovascular: Positive for irregular heartbeat and palpitations. Negative for chest pain, dyspnea on exertion, leg swelling and syncope.  Respiratory: Negative for shortness of breath.   Endocrine: Negative for cold intolerance.  Hematologic/Lymphatic: Does not bruise/bleed easily.  Skin: Negative for itching and rash.  Musculoskeletal: Negative for myalgias.  Gastrointestinal: Negative for abdominal pain, nausea and vomiting.  Genitourinary: Negative for dysuria.  Neurological: Positive for headaches. Negative for dizziness and weakness. Light-headedness: increased in headaches. Admits to 1 headache everyday last week.  Psychiatric/Behavioral: The patient is not nervous/anxious.   All other systems reviewed and are negative.      Objective:     Vitals:   12/20/19 1559  12/20/19 1610  BP: (!) 173/80 (!) 169/75  Pulse: 77   SpO2: 100%      Physical Exam  Constitutional: She is oriented to person, place, and time. She appears well-developed and well-nourished. No distress.  HENT:  Head: Normocephalic and atraumatic.  Eyes: Pupils are equal, round, and reactive to light. Conjunctivae are normal.  Neck: No JVD present.  Cardiovascular: Normal rate, regular rhythm and intact distal pulses.  Pulmonary/Chest: Effort normal and breath sounds normal. She has no wheezes. She has no rales.  Abdominal: Soft. Bowel sounds are normal. There is no rebound.  Musculoskeletal:        General: No edema.  Lymphadenopathy:    She has no cervical adenopathy.  Neurological: She is alert and oriented to person, place, and time. No cranial nerve deficit.  Skin: Skin is warm and dry.  Psychiatric: She has a normal mood and affect.  Nursing note and vitals reviewed.       Assessment & Recommendations:   78 year old African-American female with labile hypertension, hyperlipidemia, nonobstructive CAD, type II diabetes mellitus, chronic dysphagia, interstitial cystitis,   1. Essential hypertension: Uncontrolled at today's visit. Much better controlled at home. No change made today. Continue regular home monitoring.   2. Hyperlipidemia: Statin intolerant. Given mild nonobstructive CAD, recommend PCSK9 inhibitor.   3. Mild nonobstructive CAD: Recommend medical management with Aspirin 81 mg, management of hypertension and hyperlipidemia.   4. Abnormal CT scan: She is extremely worried about incidental liver findings on recent coronary CTA. She requests GI referral.  Nurse visit for Repatha administration in 2 weeks. Virtual f.u for hypertension management in 4-6 weeks.   Nigel Mormon, MD [facility] Pager: [phone] Office: [phone] If no answer Cell [phone]

## 2019-12-20 ENCOUNTER — Encounter: Payer: Self-pay | Admitting: Cardiology

## 2019-12-20 ENCOUNTER — Other Ambulatory Visit: Payer: Self-pay

## 2019-12-20 ENCOUNTER — Ambulatory Visit (INDEPENDENT_AMBULATORY_CARE_PROVIDER_SITE_OTHER): Payer: Medicare HMO | Admitting: Cardiology

## 2019-12-20 VITALS — BP 169/75 | HR 77 | Ht 64.0 in | Wt 128.9 lb

## 2019-12-20 DIAGNOSIS — I10 Essential (primary) hypertension: Secondary | ICD-10-CM | POA: Diagnosis not present

## 2019-12-20 DIAGNOSIS — R932 Abnormal findings on diagnostic imaging of liver and biliary tract: Secondary | ICD-10-CM | POA: Insufficient documentation

## 2019-12-20 DIAGNOSIS — E782 Mixed hyperlipidemia: Secondary | ICD-10-CM | POA: Diagnosis not present

## 2019-12-20 DIAGNOSIS — I2510 Atherosclerotic heart disease of native coronary artery without angina pectoris: Secondary | ICD-10-CM | POA: Insufficient documentation

## 2019-12-20 MED ORDER — REPATHA 140 MG/ML ~~LOC~~ SOSY: 140.0000 mg | PREFILLED_SYRINGE | SUBCUTANEOUS | 11 refills | Status: DC

## 2019-12-22 ENCOUNTER — Telehealth: Payer: Self-pay | Admitting: Cardiology

## 2019-12-28 ENCOUNTER — Other Ambulatory Visit (HOSPITAL_COMMUNITY): Payer: Self-pay | Admitting: Cardiology

## 2019-12-28 ENCOUNTER — Telehealth: Payer: Self-pay

## 2019-12-28 DIAGNOSIS — E782 Mixed hyperlipidemia: Secondary | ICD-10-CM | POA: Diagnosis not present

## 2019-12-28 NOTE — Telephone Encounter (Signed)
Pt called to cancel her appt for 01/03/2020 for the repatha injection, she has an appointment with liver doctor and she wants to see them first before we inject her with anything

## 2019-12-29 LAB — LIPID PANEL
Chol/HDL Ratio: 3.2 ratio (ref 0.0–4.4)
Cholesterol, Total: 250 mg/dL — ABNORMAL HIGH (ref 100–199)
HDL: 77 mg/dL (ref >39)
LDL Chol Calc (NIH): 158 mg/dL — ABNORMAL HIGH (ref 0–99)
Triglycerides: 89 mg/dL (ref 0–149)
VLDL Cholesterol Cal: 15 mg/dL (ref 5–40)

## 2020-01-03 ENCOUNTER — Ambulatory Visit: Payer: Medicare HMO

## 2020-01-04 ENCOUNTER — Other Ambulatory Visit: Payer: Self-pay | Admitting: Cardiology

## 2020-01-22 DIAGNOSIS — E119 Type 2 diabetes mellitus without complications: Secondary | ICD-10-CM | POA: Diagnosis not present

## 2020-01-22 DIAGNOSIS — H1031 Unspecified acute conjunctivitis, right eye: Secondary | ICD-10-CM | POA: Diagnosis not present

## 2020-02-02 ENCOUNTER — Telehealth: Payer: Medicare HMO | Admitting: Cardiology

## 2020-02-18 DIAGNOSIS — R1310 Dysphagia, unspecified: Secondary | ICD-10-CM | POA: Diagnosis not present

## 2020-02-18 DIAGNOSIS — Z86010 Personal history of colonic polyps: Secondary | ICD-10-CM | POA: Diagnosis not present

## 2020-02-18 DIAGNOSIS — R079 Chest pain, unspecified: Secondary | ICD-10-CM | POA: Diagnosis not present

## 2020-02-21 ENCOUNTER — Other Ambulatory Visit: Payer: Self-pay | Admitting: Gastroenterology

## 2020-03-11 ENCOUNTER — Other Ambulatory Visit (HOSPITAL_COMMUNITY)
Admission: RE | Admit: 2020-03-11 | Discharge: 2020-03-11 | Disposition: A | Discharge status: Home / Self Care | Payer: Medicare HMO | Source: Ambulatory Visit | Attending: Gastroenterology | Admitting: Gastroenterology

## 2020-03-11 DIAGNOSIS — Z20822 Contact with and (suspected) exposure to covid-19: Secondary | ICD-10-CM | POA: Diagnosis not present

## 2020-03-11 DIAGNOSIS — Z01812 Encounter for preprocedural laboratory examination: Secondary | ICD-10-CM | POA: Insufficient documentation

## 2020-03-11 LAB — SARS CORONAVIRUS 2 (TAT 6-24 HRS): SARS Coronavirus 2: NEGATIVE

## 2020-03-15 ENCOUNTER — Encounter (HOSPITAL_COMMUNITY): Payer: Self-pay | Admitting: Gastroenterology

## 2020-03-15 ENCOUNTER — Encounter (HOSPITAL_COMMUNITY)
Admission: RE | Disposition: A | Discharge status: Home / Self Care | Payer: Self-pay | Source: Home / Self Care | Attending: Gastroenterology

## 2020-03-15 ENCOUNTER — Ambulatory Visit (HOSPITAL_COMMUNITY)
Admission: RE | Admit: 2020-03-15 | Discharge: 2020-03-15 | Disposition: A | Discharge status: Home / Self Care | Payer: Medicare HMO | Attending: Gastroenterology | Admitting: Gastroenterology

## 2020-03-15 DIAGNOSIS — R1310 Dysphagia, unspecified: Secondary | ICD-10-CM | POA: Diagnosis not present

## 2020-03-15 HISTORY — PX: PH IMPEDANCE STUDY: SHX5565

## 2020-03-15 HISTORY — PX: ESOPHAGEAL MANOMETRY: SHX5429

## 2020-03-15 SURGERY — MANOMETRY, ESOPHAGUS

## 2020-03-15 MED ORDER — LIDOCAINE VISCOUS HCL 2 % MT SOLN
OROMUCOSAL | Status: AC
  Filled 2020-03-15: qty 15

## 2020-03-15 SURGICAL SUPPLY — 2 items
FACESHIELD LNG OPTICON STERILE (SAFETY) IMPLANT
GLOVE BIO SURGEON STRL SZ8 (GLOVE) ×4 IMPLANT

## 2020-03-15 NOTE — Progress Notes (Signed)
Esophageal manometry performed per protocol.  PH probe then placed at 38 cm at right nare.  Patient verbalized understanding of use of equipment and when to return.  Patient tolerated well.  Dr Wilford Corner notified of manometry and pH to be read.

## 2020-03-16 ENCOUNTER — Encounter: Payer: Self-pay | Admitting: *Deleted

## 2020-03-17 DIAGNOSIS — R079 Chest pain, unspecified: Secondary | ICD-10-CM | POA: Diagnosis not present

## 2020-03-17 DIAGNOSIS — R1310 Dysphagia, unspecified: Secondary | ICD-10-CM | POA: Diagnosis not present

## 2020-03-17 DIAGNOSIS — K220 Achalasia of cardia: Secondary | ICD-10-CM | POA: Diagnosis not present

## 2020-03-29 ENCOUNTER — Telehealth: Payer: Medicare HMO | Admitting: Cardiology

## 2020-04-02 ENCOUNTER — Other Ambulatory Visit: Payer: Self-pay | Admitting: Cardiology

## 2020-04-06 ENCOUNTER — Ambulatory Visit: Payer: Self-pay | Admitting: General Surgery

## 2020-04-06 DIAGNOSIS — E46 Unspecified protein-calorie malnutrition: Secondary | ICD-10-CM | POA: Diagnosis not present

## 2020-04-06 DIAGNOSIS — K220 Achalasia of cardia: Secondary | ICD-10-CM | POA: Diagnosis not present

## 2020-04-06 DIAGNOSIS — Z789 Other specified health status: Secondary | ICD-10-CM | POA: Diagnosis not present

## 2020-04-08 ENCOUNTER — Ambulatory Visit: Payer: Self-pay | Admitting: General Surgery

## 2020-04-08 MED ORDER — GENTAMICIN SULFATE 40 MG/ML IJ SOLN: 5.0000 mg/kg | INTRAVENOUS | Status: AC

## 2020-04-08 MED ORDER — DEXTROSE 5 % IV SOLN: 900.0000 mg | INTRAVENOUS | Status: AC

## 2020-04-13 NOTE — Progress Notes (Signed)
PCP - Harlan Stains, MD  Cardiologist - Nigel Mormon, MD [facility] 12/20/19  Chest x-ray -  EKG - 11-15-19 Stress Test -  ECHO - 07-06-18 Cardiac Cath -   Sleep Study -  CPAP -   Fasting Blood Sugar -  Checks Blood Sugar _____ times a day  Blood Thinner Instructions: Aspirin Instructions: Last Dose:  Anesthesia review:   Patient denies shortness of breath, fever, cough and chest pain at PAT appointment   Patient verbalized understanding of instructions that were given to them at the PAT appointment. Patient was also instructed that they will need to review over the PAT instructions again at home before surgery.

## 2020-04-13 NOTE — Patient Instructions (Addendum)
DUE TO COVID-19 ONLY ONE VISITOR IS ALLOWED TO COME WITH YOU AND STAY IN THE WAITING ROOM ONLY DURING PRE OP AND PROCEDURE DAY OF SURGERY. THE 1 VISITOR MAY VISIT WITH YOU AFTER SURGERY IN YOUR PRIVATE ROOM DURING VISITING HOURS ONLY!  YOU NEED TO HAVE A COVID 19 TEST ON 04-14-20 @1 :20 PM, THIS TEST MUST BE DONE BEFORE SURGERY, COME  [place], [place] , [address].  ([place]) ONCE YOUR COVID TEST IS COMPLETED, PLEASE BEGIN THE QUARANTINE INSTRUCTIONS AS OUTLINED IN YOUR HANDOUT.                HECTOR ALLAIN  04/13/2020   Your procedure is scheduled on: 04-18-20   Report to [facility] Main  Entrance    Report to admitting at 5:30 AM     Call this number if you have problems the morning of surgery [phone]    Remember: Do not eat food or drink liquids :After Midnight.     Take these medicines the morning of surgery with A SIP OF WATER: None-per pt's preference  BRUSH YOUR TEETH MORNING OF SURGERY AND RINSE YOUR MOUTH OUT, NO CHEWING GUM CANDY OR MINTS.                            You may not have any metal on your body including hair pins and              piercings     Do not wear jewelry, make-up, lotions, powders or perfumes, deodorant                       Men may shave face and neck.   Do not bring valuables to the hospital. [place].  Contacts, dentures or bridgework may not be worn into surgery.  You may bring an overnight bag   Special Instructions: N/A              Please read over the following fact sheets you were given: _____________________________________________________________________             [facility] - Preparing for Surgery Before surgery, you can play an important role.  Because skin is not sterile, your skin needs to be as free of germs as possible.  You can reduce the number of germs on your skin by washing with CHG (chlorahexidine gluconate) soap  before surgery.  CHG is an antiseptic cleaner which kills germs and bonds with the skin to continue killing germs even after washing. Please DO NOT use if you have an allergy to CHG or antibacterial soaps.  If your skin becomes reddened/irritated stop using the CHG and inform your nurse when you arrive at Short Stay. Do not shave (including legs and underarms) for at least 48 hours prior to the first CHG shower.  You may shave your face/neck. Please follow these instructions carefully:  1.  Shower with CHG Soap the night before surgery and the  morning of Surgery.  2.  If you choose to wash your hair, wash your hair first as usual with your  normal  shampoo.  3.  After you shampoo, rinse your hair and body thoroughly to remove the  shampoo.  4.  Use CHG as you would any other liquid soap.  You can apply chg directly  to the skin and wash                       Gently with a scrungie or clean washcloth.  5.  Apply the CHG Soap to your body ONLY FROM THE NECK DOWN.   Do not use on face/ open                           Wound or open sores. Avoid contact with eyes, ears mouth and genitals (private parts).                       Wash face,  Genitals (private parts) with your normal soap.             6.  Wash thoroughly, paying special attention to the area where your surgery  will be performed.  7.  Thoroughly rinse your body with warm water from the neck down.  8.  DO NOT shower/wash with your normal soap after using and rinsing off  the CHG Soap.                9.  Pat yourself dry with a clean towel.            10.  Wear clean pajamas.            11.  Place clean sheets on your bed the night of your first shower and do not  sleep with pets. Day of Surgery : Do not apply any lotions/deodorants the morning of surgery.  Please wear clean clothes to the hospital/surgery center.  FAILURE TO FOLLOW THESE INSTRUCTIONS MAY RESULT IN THE CANCELLATION OF YOUR SURGERY PATIENT  SIGNATURE_________________________________  NURSE SIGNATURE__________________________________  ________________________________________________________________________

## 2020-04-14 ENCOUNTER — Other Ambulatory Visit (HOSPITAL_COMMUNITY): Payer: Medicare HMO

## 2020-04-14 ENCOUNTER — Encounter (HOSPITAL_COMMUNITY): Payer: Self-pay

## 2020-04-14 ENCOUNTER — Other Ambulatory Visit: Payer: Self-pay

## 2020-04-14 ENCOUNTER — Encounter (HOSPITAL_COMMUNITY)
Admission: RE | Admit: 2020-04-14 | Discharge: 2020-04-14 | Disposition: A | Discharge status: Home / Self Care | Payer: Medicare HMO | Source: Ambulatory Visit | Attending: General Surgery | Admitting: General Surgery

## 2020-04-14 ENCOUNTER — Other Ambulatory Visit (HOSPITAL_COMMUNITY)
Admission: RE | Admit: 2020-04-14 | Discharge: 2020-04-14 | Disposition: A | Discharge status: Home / Self Care | Payer: Medicare HMO | Source: Ambulatory Visit | Attending: General Surgery | Admitting: General Surgery

## 2020-04-14 DIAGNOSIS — Z20822 Contact with and (suspected) exposure to covid-19: Secondary | ICD-10-CM | POA: Diagnosis not present

## 2020-04-14 DIAGNOSIS — Z01812 Encounter for preprocedural laboratory examination: Secondary | ICD-10-CM | POA: Diagnosis not present

## 2020-04-14 LAB — CBC WITH DIFFERENTIAL/PLATELET
Abs Immature Granulocytes: 0.01 10*3/uL (ref 0.00–0.07)
Basophils Absolute: 0 10*3/uL (ref 0.0–0.1)
Basophils Relative: 1 %
Eosinophils Absolute: 0 10*3/uL (ref 0.0–0.5)
Eosinophils Relative: 1 %
HCT: 41.3 % (ref 36.0–46.0)
Hemoglobin: 12.2 g/dL (ref 12.0–15.0)
Immature Granulocytes: 0 %
Lymphocytes Relative: 32 %
Lymphs Abs: 1.3 10*3/uL (ref 0.7–4.0)
MCH: 22.4 pg — ABNORMAL LOW (ref 26.0–34.0)
MCHC: 29.5 g/dL — ABNORMAL LOW (ref 30.0–36.0)
MCV: 75.8 fL — ABNORMAL LOW (ref 80.0–100.0)
Monocytes Absolute: 0.4 10*3/uL (ref 0.1–1.0)
Monocytes Relative: 10 %
Neutro Abs: 2.3 10*3/uL (ref 1.7–7.7)
Neutrophils Relative %: 56 %
Platelets: 127 10*3/uL — ABNORMAL LOW (ref 150–400)
RBC: 5.45 MIL/uL — ABNORMAL HIGH (ref 3.87–5.11)
RDW: 15.3 % (ref 11.5–15.5)
WBC: 4.1 10*3/uL (ref 4.0–10.5)
nRBC: 0 % (ref 0.0–0.2)

## 2020-04-14 LAB — BASIC METABOLIC PANEL
Anion gap: 8 (ref 5–15)
BUN: 10 mg/dL (ref 8–23)
CO2: 28 mmol/L (ref 22–32)
Calcium: 9.6 mg/dL (ref 8.9–10.3)
Chloride: 106 mmol/L (ref 98–111)
Creatinine, Ser: 0.75 mg/dL (ref 0.44–1.00)
GFR calc Af Amer: >60 mL/min (ref >60)
GFR calc non Af Amer: >60 mL/min (ref >60)
Glucose, Bld: 110 mg/dL — ABNORMAL HIGH (ref 70–99)
Potassium: 4.2 mmol/L (ref 3.5–5.1)
Sodium: 142 mmol/L (ref 135–145)

## 2020-04-14 LAB — SARS CORONAVIRUS 2 (TAT 6-24 HRS): SARS Coronavirus 2: NEGATIVE

## 2020-04-17 MED ORDER — BUPIVACAINE LIPOSOME 1.3 % IJ SUSP
20.0000 mL | Freq: Once | INTRAMUSCULAR | Status: DC
  Filled 2020-04-17: qty 20

## 2020-04-17 NOTE — Anesthesia Preprocedure Evaluation (Addendum)
Anesthesia Evaluation  Patient identified by MRN, date of birth, ID band Patient awake    Reviewed: Allergy & Precautions, NPO status , Patient's Chart, lab work & pertinent test results, reviewed documented beta blocker date and time   Airway Mallampati: II  TM Distance: >3 FB Neck ROM: Full    Dental no notable dental hx.    Pulmonary neg pulmonary ROS,    Pulmonary exam normal breath sounds clear to auscultation       Cardiovascular hypertension, Pt. on medications and Pt. on home beta blockers + CAD  Normal cardiovascular exam Rhythm:Regular Rate:Normal  ECG: SR rate 52   Neuro/Psych  Headaches, Anxiety TIA   GI/Hepatic Neg liver ROS, GERD  Medicated and Controlled,  Endo/Other  negative endocrine ROS  Renal/GU negative Renal ROS     Musculoskeletal negative musculoskeletal ROS (+)   Abdominal   Peds  Hematology  (+) REFUSES BLOOD PRODUCTS, Thrombocytopenia    Anesthesia Other Findings ACHALASIA  Reproductive/Obstetrics                            Anesthesia Physical Anesthesia Plan  ASA: III  Anesthesia Plan: General   Post-op Pain Management:    Induction: Intravenous  PONV Risk Score and Plan: 4 or greater and Ondansetron, Dexamethasone and Treatment may vary due to age or medical condition  Airway Management Planned: Oral ETT  Additional Equipment:   Intra-op Plan:   Post-operative Plan: Extubation in OR  Informed Consent: I have reviewed the patients History and Physical, chart, labs and discussed the procedure including the risks, benefits and alternatives for the proposed anesthesia with the patient or authorized representative who has indicated his/her understanding and acceptance.     Dental advisory given  Plan Discussed with: CRNA  Anesthesia Plan Comments:        Anesthesia Quick Evaluation

## 2020-04-17 NOTE — Progress Notes (Signed)
Anesthesia Chart Review   Case: [id number] Date/Time: 04/18/20 0715   Procedure: LAPAROSCOPIC HELLER MYOTOMY (N/A )   Anesthesia type: General   Pre-op diagnosis: ACHALASIA   Location: [facility] / [facility]S   Surgeons: Kinsinger, Arta Bruce, MD      DISCUSSION:78 y.o. never smoker with h/o HTN, GERD, mild nonobstructive CAD, thrombocytopenia, achalasia scheduled for above procedure 04/18/2020 with Dr. Gurney Maxin.   Pt last seen by cardiologist 12/20/2019.  Stable at this visit with close HTN monitoring.    Anticipate pt can proceed with planned procedure barring acute status change.   VS: BP (!) 151/62 (BP Location: Right Arm)   Pulse 91   Temp 36.9 C (Oral)   Resp 18   Ht 5\' 3"  (1.6 m)   Wt 51.3 kg   SpO2 100%   BMI 20.04 kg/m   PROVIDERS: Harlan Stains, MD is PCP   Vernell Leep, MD is Cardiologist  LABS: Labs reviewed: Acceptable for surgery. (all labs ordered are listed, but only abnormal results are displayed)  Labs Reviewed  BASIC METABOLIC PANEL - Abnormal; Notable for the following components:      Result Value   Glucose, Bld 110 (*)    All other components within normal limits  CBC WITH DIFFERENTIAL/PLATELET - Abnormal; Notable for the following components:   RBC 5.45 (*)    MCV 75.8 (*)    MCH 22.4 (*)    MCHC 29.5 (*)    Platelets 127 (*)    All other components within normal limits     IMAGES:   EKG: 11/15/2019 Rate 52 bpm Leftward axis Two PVC's seen  Negative T-waves-Possible Anterior ischemia  CV: Echo 07/06/2018 Study Conclusions   - Left ventricle: The cavity size was normal. Wall thickness was  normal. Systolic function was normal. The estimated ejection  fraction was in the range of 55% to 60%. Wall motion was normal;  there were no regional wall motion abnormalities. Doppler  parameters are consistent with abnormal left ventricular  relaxation (grade 1 diastolic dysfunction).  - Left atrium: The atrium was mildly  dilated.  - Right atrium: The atrium was mildly dilated.   Impressions:   - Normal LV systolic function; mild diastolic dysfunction; mild  biatrial enlargement. Past Medical History:  Diagnosis Date  . Acid reflux   . Anemia   . Anxiety   . Atherosclerosis   . Back pain   . Coronary artery disease   . GERD (gastroesophageal reflux disease)   . HTN (hypertension) 12/03/2013   CT scan-no pheochromocytoma  . Hyperlipidemia   . Hypertension   . Hypertensive heart disease without heart failure   . Thrombocytopenia ([place])   . Vitamin D deficiency     Past Surgical History:  Procedure Laterality Date  . ESOPHAGEAL MANOMETRY N/A 03/15/2020   Procedure: ESOPHAGEAL MANOMETRY (EM);  Surgeon: Wilford Corner, MD;  Location: [facility];  Service: Endoscopy;  Laterality: N/A;  . EYE SURGERY    . [place] IMPEDANCE STUDY N/A 03/15/2020   Procedure: [place] IMPEDANCE STUDY;  Surgeon: Wilford Corner, MD;  Location: [facility] ENDOS[facility];  Service: Endoscopy;  Laterality: N/A;  . SHOULDER SURGERY Bilateral   . TONSILLECTOMY      MEDICATIONS: . amLODipine (NORVASC) 10 MG tablet  . amLODipine (NORVASC) 2.5 MG tablet  . aspirin 81 MG tablet  . BYSTOLIC 10 MG tablet  . Calcium Carbonate Antacid (TUMS CHEWY BITES PO)  . cetirizine (ZYRTEC) 10 MG tablet  . EPINEPHrine 0.3 mg/0.3 mL  IJ SOAJ injection  . Evolocumab (REPATHA) 140 MG/ML SOSY  . ibuprofen (ADVIL) 200 MG tablet  . LORazepam (ATIVAN) 0.5 MG tablet  . magnesium hydroxide (MILK OF MAGNESIA) 400 MG/5ML suspension  . Nutritional Supplements (ENSURE CLEAR PO)  . OVER THE COUNTER MEDICATION  . pantoprazole (PROTONIX) 40 MG tablet  . psyllium (METAMUCIL) 58.6 % powder   No current facility-administered medications for this encounter.   Derrill Memo ON 04/18/2020] bupivacaine liposome (EXPAREL) 1.3 % injection 266 mg   Maia Plan [facility] Pre-Surgical Testing [phone] 04/17/20  11:39 AM

## 2020-04-18 ENCOUNTER — Encounter (HOSPITAL_COMMUNITY)
Admission: RE | Disposition: A | Discharge status: Home / Self Care | Payer: Self-pay | Source: Home / Self Care | Attending: General Surgery

## 2020-04-18 ENCOUNTER — Inpatient Hospital Stay (HOSPITAL_COMMUNITY): Payer: Medicare HMO | Admitting: Physician Assistant

## 2020-04-18 ENCOUNTER — Other Ambulatory Visit: Payer: Self-pay

## 2020-04-18 ENCOUNTER — Inpatient Hospital Stay (HOSPITAL_COMMUNITY): Payer: Medicare HMO | Admitting: Anesthesiology

## 2020-04-18 ENCOUNTER — Inpatient Hospital Stay (HOSPITAL_COMMUNITY)
Admission: RE | Admit: 2020-04-18 | Discharge: 2020-04-20 | DRG: 328 | Disposition: A | Discharge status: Home / Self Care | Payer: Medicare HMO | Attending: General Surgery | Admitting: General Surgery

## 2020-04-18 ENCOUNTER — Encounter (HOSPITAL_COMMUNITY): Payer: Self-pay | Admitting: General Surgery

## 2020-04-18 DIAGNOSIS — Z79899 Other long term (current) drug therapy: Secondary | ICD-10-CM

## 2020-04-18 DIAGNOSIS — R001 Bradycardia, unspecified: Secondary | ICD-10-CM | POA: Diagnosis not present

## 2020-04-18 DIAGNOSIS — Z885 Allergy status to narcotic agent status: Secondary | ICD-10-CM

## 2020-04-18 DIAGNOSIS — Z880 Allergy status to penicillin: Secondary | ICD-10-CM | POA: Diagnosis not present

## 2020-04-18 DIAGNOSIS — K219 Gastro-esophageal reflux disease without esophagitis: Secondary | ICD-10-CM | POA: Diagnosis present

## 2020-04-18 DIAGNOSIS — Z7982 Long term (current) use of aspirin: Secondary | ICD-10-CM | POA: Diagnosis not present

## 2020-04-18 DIAGNOSIS — K449 Diaphragmatic hernia without obstruction or gangrene: Secondary | ICD-10-CM | POA: Diagnosis present

## 2020-04-18 DIAGNOSIS — K220 Achalasia of cardia: Secondary | ICD-10-CM | POA: Diagnosis not present

## 2020-04-18 DIAGNOSIS — I119 Hypertensive heart disease without heart failure: Secondary | ICD-10-CM | POA: Diagnosis not present

## 2020-04-18 DIAGNOSIS — I2510 Atherosclerotic heart disease of native coronary artery without angina pectoris: Secondary | ICD-10-CM | POA: Diagnosis present

## 2020-04-18 DIAGNOSIS — I10 Essential (primary) hypertension: Secondary | ICD-10-CM | POA: Diagnosis not present

## 2020-04-18 DIAGNOSIS — Z888 Allergy status to other drugs, medicaments and biological substances status: Secondary | ICD-10-CM

## 2020-04-18 DIAGNOSIS — E782 Mixed hyperlipidemia: Secondary | ICD-10-CM | POA: Diagnosis not present

## 2020-04-18 DIAGNOSIS — Z886 Allergy status to analgesic agent status: Secondary | ICD-10-CM

## 2020-04-18 HISTORY — PX: HELLER MYOTOMY: SHX5259

## 2020-04-18 LAB — CBC
HCT: 40 % (ref 36.0–46.0)
Hemoglobin: 12 g/dL (ref 12.0–15.0)
MCH: 22.8 pg — ABNORMAL LOW (ref 26.0–34.0)
MCHC: 30 g/dL (ref 30.0–36.0)
MCV: 76 fL — ABNORMAL LOW (ref 80.0–100.0)
Platelets: 124 10*3/uL — ABNORMAL LOW (ref 150–400)
RBC: 5.26 MIL/uL — ABNORMAL HIGH (ref 3.87–5.11)
RDW: 15.2 % (ref 11.5–15.5)
WBC: 7.6 10*3/uL (ref 4.0–10.5)
nRBC: 0 % (ref 0.0–0.2)

## 2020-04-18 LAB — CREATININE, SERUM
Creatinine, Ser: 0.62 mg/dL (ref 0.44–1.00)
GFR calc Af Amer: >60 mL/min (ref >60)
GFR calc non Af Amer: >60 mL/min (ref >60)

## 2020-04-18 SURGERY — ESOPHAGOMYOTOMY, LAPAROSCOPIC, HELLER
Anesthesia: General | Site: Abdomen

## 2020-04-18 MED ORDER — BUPIVACAINE LIPOSOME 1.3 % IJ SUSP
INTRAMUSCULAR | Status: DC | PRN
  Administered 2020-04-18: 20 mL

## 2020-04-18 MED ORDER — MIDAZOLAM HCL 2 MG/2ML IJ SOLN
INTRAMUSCULAR | Status: AC
  Filled 2020-04-18: qty 2

## 2020-04-18 MED ORDER — LACTATED RINGERS IV SOLN: INTRAVENOUS | Status: DC

## 2020-04-18 MED ORDER — KETAMINE HCL 10 MG/ML IJ SOLN
INTRAMUSCULAR | Status: AC
  Filled 2020-04-18: qty 1

## 2020-04-18 MED ORDER — PHENYLEPHRINE HCL (PRESSORS) 10 MG/ML IV SOLN
INTRAVENOUS | Status: AC
  Filled 2020-04-18: qty 1

## 2020-04-18 MED ORDER — CHLORHEXIDINE GLUCONATE CLOTH 2 % EX PADS: 6.0000 | MEDICATED_PAD | Freq: Once | CUTANEOUS | Status: DC

## 2020-04-18 MED ORDER — PHENYLEPHRINE HCL (PRESSORS) 10 MG/ML IV SOLN
INTRAVENOUS | Status: DC | PRN
  Administered 2020-04-18 (×2): 80 ug via INTRAVENOUS

## 2020-04-18 MED ORDER — ACETAMINOPHEN 10 MG/ML IV SOLN: 1000.0000 mg | Freq: Once | INTRAVENOUS | Status: DC | PRN

## 2020-04-18 MED ORDER — SUCCINYLCHOLINE CHLORIDE 200 MG/10ML IV SOSY
PREFILLED_SYRINGE | INTRAVENOUS | Status: DC | PRN
  Administered 2020-04-18: 40 mg via INTRAVENOUS

## 2020-04-18 MED ORDER — EPHEDRINE SULFATE-NACL 50-0.9 MG/10ML-% IV SOSY
PREFILLED_SYRINGE | INTRAVENOUS | Status: DC | PRN
  Administered 2020-04-18: 15 mg via INTRAVENOUS

## 2020-04-18 MED ORDER — ONDANSETRON HCL 4 MG/2ML IJ SOLN
INTRAMUSCULAR | Status: DC | PRN
  Administered 2020-04-18: 4 mg via INTRAVENOUS

## 2020-04-18 MED ORDER — LACTATED RINGERS IR SOLN
Status: DC | PRN
  Administered 2020-04-18: 1000 mL

## 2020-04-18 MED ORDER — FENTANYL CITRATE (PF) 100 MCG/2ML IJ SOLN: 25.0000 ug | INTRAMUSCULAR | Status: DC | PRN

## 2020-04-18 MED ORDER — FENTANYL CITRATE (PF) 100 MCG/2ML IJ SOLN
INTRAMUSCULAR | Status: DC | PRN
  Administered 2020-04-18 (×2): 50 ug via INTRAVENOUS
  Administered 2020-04-18: 25 ug via INTRAVENOUS
  Administered 2020-04-18 (×2): 50 ug via INTRAVENOUS
  Administered 2020-04-18: 25 ug via INTRAVENOUS

## 2020-04-18 MED ORDER — PROPOFOL 10 MG/ML IV BOLUS
INTRAVENOUS | Status: AC
  Filled 2020-04-18: qty 20

## 2020-04-18 MED ORDER — FAMOTIDINE IN NACL 20-0.9 MG/50ML-% IV SOLN
20.0000 mg | Freq: Once | INTRAVENOUS | Status: AC
  Administered 2020-04-18: 20 mg via INTRAVENOUS
  Filled 2020-04-18: qty 50

## 2020-04-18 MED ORDER — MIDAZOLAM HCL 5 MG/5ML IJ SOLN
INTRAMUSCULAR | Status: DC | PRN
  Administered 2020-04-18 (×2): 1 mg via INTRAVENOUS

## 2020-04-18 MED ORDER — SUGAMMADEX SODIUM 200 MG/2ML IV SOLN
INTRAVENOUS | Status: DC | PRN
  Administered 2020-04-18: 150 mg via INTRAVENOUS

## 2020-04-18 MED ORDER — ONDANSETRON HCL 4 MG/2ML IJ SOLN: 4.0000 mg | Freq: Once | INTRAMUSCULAR | Status: DC | PRN

## 2020-04-18 MED ORDER — ONDANSETRON HCL 4 MG/2ML IJ SOLN
INTRAMUSCULAR | Status: AC
  Filled 2020-04-18: qty 2

## 2020-04-18 MED ORDER — CLINDAMYCIN PHOSPHATE 600 MG/50ML IV SOLN
600.0000 mg | Freq: Once | INTRAVENOUS | Status: AC
  Administered 2020-04-18: 600 mg via INTRAVENOUS
  Filled 2020-04-18: qty 50

## 2020-04-18 MED ORDER — PROPOFOL 10 MG/ML IV BOLUS
INTRAVENOUS | Status: DC | PRN
  Administered 2020-04-18 (×2): 20 mg via INTRAVENOUS
  Administered 2020-04-18: 150 mg via INTRAVENOUS

## 2020-04-18 MED ORDER — KETOROLAC TROMETHAMINE 15 MG/ML IJ SOLN: 15.0000 mg | Freq: Once | INTRAMUSCULAR | Status: DC | PRN

## 2020-04-18 MED ORDER — ENOXAPARIN SODIUM 40 MG/0.4ML ~~LOC~~ SOLN
40.0000 mg | SUBCUTANEOUS | Status: DC
  Administered 2020-04-20: 40 mg via SUBCUTANEOUS
  Filled 2020-04-18 (×2): qty 0.4

## 2020-04-18 MED ORDER — PHENYLEPHRINE HCL-NACL 10-0.9 MG/250ML-% IV SOLN
INTRAVENOUS | Status: DC | PRN
  Administered 2020-04-18: 35 ug/min via INTRAVENOUS

## 2020-04-18 MED ORDER — LIDOCAINE 2% (20 MG/ML) 5 ML SYRINGE
INTRAMUSCULAR | Status: DC | PRN
  Administered 2020-04-18: 1.5 mg/kg/h via INTRAVENOUS

## 2020-04-18 MED ORDER — DEXTROSE-NACL 5-0.45 % IV SOLN
INTRAVENOUS | Status: DC
  Administered 2020-04-18: 50 mL/h via INTRAVENOUS

## 2020-04-18 MED ORDER — DEXAMETHASONE SODIUM PHOSPHATE 10 MG/ML IJ SOLN
INTRAMUSCULAR | Status: AC
  Filled 2020-04-18: qty 1

## 2020-04-18 MED ORDER — FENTANYL CITRATE (PF) 250 MCG/5ML IJ SOLN
INTRAMUSCULAR | Status: AC
  Filled 2020-04-18: qty 5

## 2020-04-18 MED ORDER — HYDROMORPHONE HCL 1 MG/ML IJ SOLN
0.5000 mg | INTRAMUSCULAR | Status: DC | PRN
  Administered 2020-04-19 (×2): 0.5 mg via INTRAVENOUS
  Filled 2020-04-18 (×2): qty 0.5

## 2020-04-18 MED ORDER — ROCURONIUM BROMIDE 10 MG/ML (PF) SYRINGE
PREFILLED_SYRINGE | INTRAVENOUS | Status: DC | PRN
  Administered 2020-04-18: 30 mg via INTRAVENOUS
  Administered 2020-04-18: 10 mg via INTRAVENOUS

## 2020-04-18 MED ORDER — BUPIVACAINE HCL 0.25 % IJ SOLN
INTRAMUSCULAR | Status: AC
  Filled 2020-04-18: qty 1

## 2020-04-18 MED ORDER — IBUPROFEN 100 MG/5ML PO SUSP
200.0000 mg | Freq: Four times a day (QID) | ORAL | Status: DC | PRN
  Filled 2020-04-18 (×2): qty 10

## 2020-04-18 MED ORDER — KETAMINE HCL 10 MG/ML IJ SOLN
INTRAMUSCULAR | Status: DC | PRN
  Administered 2020-04-18: 30 mg via INTRAVENOUS

## 2020-04-18 MED ORDER — DIPHENHYDRAMINE HCL 50 MG/ML IJ SOLN
INTRAMUSCULAR | Status: DC | PRN
  Administered 2020-04-18: 12.5 mg via INTRAVENOUS

## 2020-04-18 MED ORDER — DEXAMETHASONE SODIUM PHOSPHATE 10 MG/ML IJ SOLN
INTRAMUSCULAR | Status: DC | PRN
  Administered 2020-04-18: 5 mg via INTRAVENOUS

## 2020-04-18 MED ORDER — 0.9 % SODIUM CHLORIDE (POUR BTL) OPTIME
TOPICAL | Status: DC | PRN
  Administered 2020-04-18: 1000 mL

## 2020-04-18 MED ORDER — GLYCOPYRROLATE PF 0.2 MG/ML IJ SOSY
PREFILLED_SYRINGE | INTRAMUSCULAR | Status: DC | PRN
  Administered 2020-04-18: .2 mg via INTRAVENOUS

## 2020-04-18 MED ORDER — BUPIVACAINE HCL (PF) 0.25 % IJ SOLN
INTRAMUSCULAR | Status: DC | PRN
  Administered 2020-04-18: 30 mL

## 2020-04-18 SURGICAL SUPPLY — 59 items
ADH SKN CLS APL DERMABOND .7 (GAUZE/BANDAGES/DRESSINGS) ×1
APL PRP STRL LF DISP 70% ISPRP (MISCELLANEOUS) ×1
APL SKNCLS STERI-STRIP NONHPOA (GAUZE/BANDAGES/DRESSINGS) ×1
APPLIER CLIP 5 13 M/L LIGAMAX5 (MISCELLANEOUS)
APPLIER CLIP ROT 10 11.4 M/L (STAPLE)
APR CLP MED LRG 11.4X10 (STAPLE)
APR CLP MED LRG 5 ANG JAW (MISCELLANEOUS)
BENZOIN TINCTURE PRP APPL 2/3 (GAUZE/BANDAGES/DRESSINGS) ×2 IMPLANT
BNDG ADH 1X3 SHEER STRL LF (GAUZE/BANDAGES/DRESSINGS) ×12 IMPLANT
BNDG ADH THN 3X1 STRL LF (GAUZE/BANDAGES/DRESSINGS) ×6
CHLORAPREP W/TINT 26 (MISCELLANEOUS) ×2 IMPLANT
CLIP APPLIE 5 13 M/L LIGAMAX5 (MISCELLANEOUS) IMPLANT
CLIP APPLIE ROT 10 11.4 M/L (STAPLE) IMPLANT
COVER SURGICAL LIGHT HANDLE (MISCELLANEOUS) ×2 IMPLANT
COVER WAND RF STERILE (DRAPES) IMPLANT
DECANTER SPIKE VIAL GLASS SM (MISCELLANEOUS) ×2 IMPLANT
DERMABOND ADVANCED (GAUZE/BANDAGES/DRESSINGS) ×1
DERMABOND ADVANCED .7 DNX12 (GAUZE/BANDAGES/DRESSINGS) ×1 IMPLANT
DRAIN CHANNEL 19F RND (DRAIN) IMPLANT
DRAIN PENROSE .75X.25X12 SILI (WOUND CARE) ×1 IMPLANT
DRAIN PENROSE 0.5X18 (DRAIN) ×2 IMPLANT
ELECT L-HOOK LAP 45CM DISP (ELECTROSURGICAL)
ELECT REM PT RETURN 15FT ADLT (MISCELLANEOUS) ×2 IMPLANT
ELECTRODE L-HOOK LAP 45CM DISP (ELECTROSURGICAL) IMPLANT
EVACUATOR SILICONE 100CC (DRAIN) IMPLANT
GLOVE BIOGEL PI IND STRL 7.0 (GLOVE) ×1 IMPLANT
GLOVE BIOGEL PI INDICATOR 7.0 (GLOVE) ×1
GLOVE SURG SS PI 7.0 STRL IVOR (GLOVE) ×2 IMPLANT
GOWN STRL REUS W/TWL XL LVL3 (GOWN DISPOSABLE) ×8 IMPLANT
KIT BASIN OR (CUSTOM PROCEDURE TRAY) ×2 IMPLANT
KIT TURNOVER KIT A (KITS) IMPLANT
L-HOOK LAP DISP 36CM (ELECTROSURGICAL) ×2
LHOOK LAP DISP 36CM (ELECTROSURGICAL) IMPLANT
MARKER SKIN DUAL TIP RULER LAB (MISCELLANEOUS) ×2 IMPLANT
PAD POSITIONING PINK XL (MISCELLANEOUS) ×2 IMPLANT
PENCIL SMOKE EVACUATOR (MISCELLANEOUS) IMPLANT
PROTECTOR NERVE ULNAR (MISCELLANEOUS) IMPLANT
SCISSORS LAP 5X45 EPIX DISP (ENDOMECHANICALS) ×2 IMPLANT
SET IRRIG TUBING LAPAROSCOPIC (IRRIGATION / IRRIGATOR) ×2 IMPLANT
SHEARS HARMONIC HDI 36CM (ELECTROSURGICAL) ×1 IMPLANT
SLEEVE XCEL OPT CAN 5 100 (ENDOMECHANICALS) ×4 IMPLANT
STRIP CLOSURE SKIN 1/2X4 (GAUZE/BANDAGES/DRESSINGS) ×2 IMPLANT
SUT ETHIBOND 0 36 GRN (SUTURE) ×6 IMPLANT
SUT ETHILON 2 0 PS N (SUTURE) IMPLANT
SUT MNCRL AB 4-0 PS2 18 (SUTURE) ×2 IMPLANT
SUT SILK 0 SH 30 (SUTURE) IMPLANT
SUT SILK 2 0 SH (SUTURE) ×15 IMPLANT
TAPE CLOTH 4X10 WHT NS (GAUZE/BANDAGES/DRESSINGS) IMPLANT
TIP INNERVISION DETACH 40FR (MISCELLANEOUS) IMPLANT
TIP INNERVISION DETACH 50FR (MISCELLANEOUS) IMPLANT
TIP INNERVISION DETACH 56FR (MISCELLANEOUS) IMPLANT
TIPS INNERVISION DETACH 40FR (MISCELLANEOUS)
TOWEL OR 17X26 10 PK STRL BLUE (TOWEL DISPOSABLE) ×2 IMPLANT
TOWEL OR NON WOVEN STRL DISP B (DISPOSABLE) IMPLANT
TRAY FOL W/BAG SLVR 16FR STRL (SET/KITS/TRAYS/PACK) ×1 IMPLANT
TRAY FOLEY W/BAG SLVR 16FR LF (SET/KITS/TRAYS/PACK) ×2
TRAY LAPAROSCOPIC (CUSTOM PROCEDURE TRAY) ×2 IMPLANT
TROCAR BLADELESS OPT 5 100 (ENDOMECHANICALS) ×2 IMPLANT
TROCAR XCEL 12X100 BLDLESS (ENDOMECHANICALS) ×2 IMPLANT

## 2020-04-18 NOTE — Progress Notes (Signed)
Spoke with surgeon concerning unit 4E request.   Per surgeon, this was a mis click.   Dr. Kieth Brightly would like pt to go to Unit 3E.  Bed placement notified. AC notified. Unit 3E notified.  Donna Baldwin

## 2020-04-18 NOTE — Op Note (Signed)
Preoperative diagnosis: achalasia  Postoperative diagnosis: same   Procedure: laparoscopic Heller myotomy with Toupet fundoplication, hiatal hernia repair  Surgeon: Gurney Maxin, M.D.  Asst: Romana Juniper  Anesthesia: general  Indications for procedure: Donna Baldwin is a 79 y.o. year old female with symptoms of progressive dysphagia. Work up was consistent with Type II achalasia. We discussed options for treatment and decision was made to proceed with laparoscopic heller myotomy with partial fundoplication.  Description of procedure: Following informed consent, the patient was taken to the operating room and placed on the operating table in the supine position.  She had previously received prophylactic antibiotics and subcutaneous heparin for DVT prophylaxis in the pre-op holding area.  After induction of general endotracheal anesthesia by the anesthesiologist, the patient underwent placement of sequential compression devices and an oro-gastric tube.  A timeout was confirmed by the surgery and anesthesia teams.  The patient was adequately padded at all pressure points and placed on a footboard to prevent slippage from the OR table during extremes of position during surgery.  She underwent a routine sterile prep and drape of her entire abdomen.    Next, A transverse incision was made under the left subcostal area and a 63mm optical viewing trocar was introduced into the peritoneal cavity. Pneumoperitoneum was applied with a high flow and low pressure. A laparoscope was inserted to confirm placement. A extraperitoneal block was then placed at the lateral abdominal wall using exparel diluted with marcaine. 5 additional incisions were placed: 1 13mm trocar to the left of the midline. 1 additional 62mm trocar in the left lateral area, 1 28mm trocar in the right mid abdomen, 1 61mm trocar in the right subcostal area, and a Nathanson retractor was placed through a subxiphoid incision.  On  evaluation of the abdomen, There was a moderate sized hiatal hernia, there was good esophageal length and normal appearing stomach. Dissection began at the medial aspect of the hiatus to free the right crus off of the sac and of esophageal structure.  This was continued anteriorly.  Additional posterior dissection was performed at this time to create a plane between the posterior esophagus and the posterior crus.  Next, went over to the upper stomach and divided the short gastrics of the upper half of the stomach there is moderate amount of adhesion close to the superior pole of the spleen care was taken to divide these carefully.  There is no bleeding the fundus was completely freed from the diaphragm as well.  This allowed [place] to complete her 3 other 60 degree dissection of the esophagus away from the crus.  Additional dissection of the thoracic esophagus was performed to ensure appropriate length of esophagus and allow for good length of myotomy.  Anterior and posterior vagus nerves were identified and a physician on the right anterior esophagus was chosen for myotomy to avoid being near either vagus.  Next, the most inferior esophagus was retracted and blunt dissection of the longitudinal fibers was performed to allow visualization of the circular fibers which was then divided with harmonic scalpel or cautery hook.  Great care was taken with blunt dissection as we continued this dissection moving into Thoracics esophagus.  Submucosal layer was visualized.  Dissection was then continued down onto the stomach.  Because of concern about nerve cross along the anterior aspect of the stomach there was a window created within the fat pad of the stomach to preserve the nerve within that and then the myotomy was continued for 2  to 3 cm upon the stomach.  At this time Dr. Kae Heller went up and performed upper endoscopy to visualize the mucosal area and see if there are any circular fibers remaining.  There appeared to be  some circular fibers still within the proximal stomach however the esophagus was distending well without any remaining fibers.  There did not appear any full-thickness injury to the mucosa submucosa at the myotomy site.  Care was then taken to continue to dissect the circular fibers of the stomach and submucosa was better visualized going completely onto the stomach.  Next, the hiatal hernia was repaired with 2 0 Ethibond sutures.  The hiatus after this repair was approximately 3 to 3.5 cm in diameter.  Next, a partial fundoplication was performed by bringing the posterior fundus around posterior to the esophagus and suturing it in 3 places to the intra-abdominal right anterior esophagus.  Additionally anterior fundus was sutured to the left esophagus in 3 places. Care was again taken to avoid injury to the nerve on the anterior aspect of the esophagus.  The abdomen was then inspected for any bleeding hemostasis found intact along with splenic dissection.  There is no injury to the liver.  Liver retractor was removed.  The 12 mm trocar was removed and the fascia closed with 0 Vicryl by suture passer.  Pneumoperitoneum was then removed and all trochars were removed and skin incisions were closed with 4-0 Monocryl in subcuticular interrupted fashion.  All counts were correct.  Patient awoke from anesthesia was brought to PACU in stable condition.  Findings: complete myotomy of distal esophagus and proximal stomach, hiatal hernia  Specimen: none  Implant: none   Blood loss: 20 ml  Local anesthesia: 50 ml Exparel:Marcaine Mix  Complications: none  Gurney Maxin, M.D. General, Bariatric, & Minimally Invasive Surgery [facility]

## 2020-04-18 NOTE — Anesthesia Procedure Notes (Deleted)
Performed by: Towanda Hornstein E, CRNA       

## 2020-04-18 NOTE — H&P (Signed)
Donna Baldwin is an 79 y.o. female.   Chief Complaint: dsyphagia HPI: 79 yo female with 10 year history of progressively worsening swallowing issues. In the last 3 months she has been on a strict liquid diet with weight loss. Work up was consistent with achalasia.  Past Medical History:  Diagnosis Date  . Acid reflux   . Anemia   . Anxiety   . Atherosclerosis   . Back pain   . Coronary artery disease   . GERD (gastroesophageal reflux disease)   . HTN (hypertension) 12/03/2013   CT scan-no pheochromocytoma  . Hyperlipidemia   . Hypertension   . Hypertensive heart disease without heart failure   . Thrombocytopenia ([place])   . Vitamin D deficiency     Past Surgical History:  Procedure Laterality Date  . ESOPHAGEAL MANOMETRY N/A 03/15/2020   Procedure: ESOPHAGEAL MANOMETRY (EM);  Surgeon: Wilford Corner, MD;  Location: [facility];  Service: Endoscopy;  Laterality: N/A;  . EYE SURGERY    . [place] IMPEDANCE STUDY N/A 03/15/2020   Procedure: [place] IMPEDANCE STUDY;  Surgeon: Wilford Corner, MD;  Location: [facility];  Service: Endoscopy;  Laterality: N/A;  . SHOULDER SURGERY Bilateral   . TONSILLECTOMY      Family History  Problem Relation Age of Onset  . Cancer Father    Social History:  reports that she has never smoked. She has never used smokeless tobacco. She reports that she does not drink alcohol or use drugs.  Allergies:  Allergies  Allergen Reactions  . Acetaminophen Other (See Comments)    Tremor, anxiety  . Beef-Derived Products Anaphylaxis  . Codeine Hives and Anxiety  . Epinephrine Palpitations  . Hydralazine Hives and Shortness Of Breath  . Iodine Anaphylaxis  . Ivp Dye [Iodinated Diagnostic Agents] Anaphylaxis and Other (See Comments)  . Latex Rash  . Morphine And Related Other (See Comments)    Tremors, increased heart rate, excitement, confusion per pt  . Penicillins Hives    Has patient had a PCN reaction causing immediate rash,  facial/tongue/throat swelling, SOB or lightheadedness with hypotension: Yes Has patient had a PCN reaction causing severe rash involving mucus membranes or skin necrosis: Yes Has patient had a PCN reaction that required hospitalization: Yes Has patient had a PCN reaction occurring within the last 10 years: No If all of the above answers are "NO", then may proceed with Cephalosporin use.   . Sertraline Other (See Comments)    Numbness in mouth and hyperactivity  . Shellfish Allergy Hives  . Solu-Medrol [Methylprednisolone] Other (See Comments)    Numbness and tingling in mouth  . Sulfa Antibiotics Hives and Rash  . Barbiturates Other (See Comments)    Excitement   . Bee Pollen Hives  . Bee Venom Hives  . Diovan [Valsartan] Nausea Only  . Gabapentin Other (See Comments)    Visual disturbance  . Livalo [Pitavastatin] Swelling and Other (See Comments)    Facial swelling  . Other Palpitations and Other (See Comments)    NARCOTIC ANALGESICS-TREMORS, INCREASED HEART RATE Hmg-Coa Reductase Inhibitors - intolerance unknown   . Oxycodone Swelling, Rash and Cough  . Promethazine-Dm Other (See Comments)    "Felt funny"  . Tape Rash and Other (See Comments)    Red blotches   . Wellness Essentials Blood Sugr [Nutritional Supplements] Other (See Comments)    Nuts, strawberries, bicarbonate of soda,  . Albuterol Other (See Comments)    Caused wheezing  . Famotidine Other (See Comments)  Unknown reaction  . Pred Forte [Prednisolone Acetate] Other (See Comments)    Patient is seeing odd color and images while using this  . Celebrex [Celecoxib] Swelling and Other (See Comments)    Swelling and numbness (mouth)  . Lanolin Itching  . Mobic [Meloxicam] Swelling and Other (See Comments)    Swelling and numbness in mouth  . Nickel Rash  . Nitrates, Organic Rash and Other (See Comments)    Chest tightness also  . Soy Allergy Rash  . Strawberry Extract Rash    Medications Prior to  Admission  Medication Sig Dispense Refill  . amLODipine (NORVASC) 2.5 MG tablet Take 5 mg by mouth daily.    [name] BYSTOLIC 10 MG tablet TAKE 1 TABLET BY MOUTH EVERY DAY (Patient taking differently: Take 10 mg by mouth daily. ) 90 tablet 0  . Calcium Carbonate Antacid (TUMS CHEWY BITES PO) Take 1 tablet by mouth daily as needed (acid reflux).    [name] EPINEPHrine 0.3 mg/0.3 mL IJ SOAJ injection Inject 0.3 mg into the muscle once as needed (for anaphylaxis).   1  . Nutritional Supplements (ENSURE CLEAR PO) Take 237 mLs by mouth daily.    . psyllium (METAMUCIL) 58.6 % powder Take 1 packet by mouth daily as needed (constipation).    [name] amLODipine (NORVASC) 10 MG tablet Take 1 tablet (10 mg total) by mouth daily. (Patient not taking: Reported on 04/07/2020) 30 tablet 3  . aspirin 81 MG tablet Take 81 mg by mouth daily as needed (for pain and pressure).     . cetirizine (ZYRTEC) 10 MG tablet Take 10 mg by mouth daily as needed for allergies.     . Evolocumab (REPATHA) 140 MG/ML SOSY Inject 140 mg into the skin every 14 (fourteen) days. 2.1 mL 11  . ibuprofen (ADVIL) 200 MG tablet Take 200 mg by mouth daily as needed for moderate pain.    [name] LORazepam (ATIVAN) 0.5 MG tablet Take 0.5 mg by mouth daily as needed for anxiety.    . magnesium hydroxide (MILK OF MAGNESIA) 400 MG/5ML suspension Take 30-45 mLs by mouth daily as needed for mild constipation.    [name] OVER THE COUNTER MEDICATION Apply 1 application topically daily as needed (pain). Neuropathy frankincense & myrrh topical pain reliever    . pantoprazole (PROTONIX) 40 MG tablet Take 40 mg by mouth daily as needed for heartburn.      No results found for this or any previous visit (from the past 48 hour(s)). No results found.  Review of Systems  Constitutional: Negative for chills and fever.  HENT: Negative for hearing loss.   Respiratory: Negative for cough.   Cardiovascular: Negative for chest pain and palpitations.  Gastrointestinal: Negative for  abdominal pain, nausea and vomiting.  Genitourinary: Negative for dysuria and urgency.  Musculoskeletal: Negative for myalgias and neck pain.  Skin: Negative for rash.  Neurological: Negative for dizziness and headaches.  Hematological: Does not bruise/bleed easily.  Psychiatric/Behavioral: Negative for suicidal ideas.    Blood pressure (!) 155/67, pulse (!) 52, temperature 98 F (36.7 C), temperature source Oral, resp. rate 18, height 5\' 3"  (1.6 m), weight 51.3 kg, SpO2 100 %. Physical Exam  Vitals reviewed. Constitutional: She is oriented to person, place, and time. She appears well-developed and well-nourished.  HENT:  Head: Normocephalic and atraumatic.  Eyes: Pupils are equal, round, and reactive to light. Conjunctivae and EOM are normal.  Cardiovascular: Normal rate and regular rhythm.  Respiratory: Effort normal and breath sounds normal.  GI: Soft. Bowel sounds are normal. She exhibits no distension. There is no abdominal tenderness.  Musculoskeletal:        General: Normal range of motion.     Cervical back: Normal range of motion and neck supple.  Neurological: She is alert and oriented to person, place, and time.  Skin: Skin is warm and dry.  Psychiatric: She has a normal mood and affect. Her behavior is normal.    Assessment/Plan 79 yo female with type II achalasia -lap heller myotomy with partial fundoplication -inpatient admission -[facility] protocol  Mickeal Skinner, MD 04/18/2020, 7:25 AM

## 2020-04-18 NOTE — Op Note (Signed)
Operative note  Procedure: Upper endoscopy   Surgeon: Clovis Riley, M.D.  Anesthesia: Gen.   Description of procedure: The patient was undergoing laparoscopic heller myotomy by Dr. Kieth Brightly. The endoscope was placed in the mouth oropharynx and under endoscopic vision it was advanced to the distal esophagus. The area of myotomy along the distal esophagus and proximal stomach was inspected and confirmed to be widely patent, easily traversible with the endoscope. There is no perforation of the mucosa.  The lumen was decompressed and the scope was withdrawn without difficulty.    Clovis Riley, M.D. General, Bariatric, & Minimally Invasive Surgery [facility]

## 2020-04-18 NOTE — Anesthesia Procedure Notes (Addendum)
Procedure Name: Intubation Date/Time: 04/18/2020 7:41 AM Performed by: Lissa Morales, CRNA Pre-anesthesia Checklist: Patient identified, Emergency Drugs available, Patient being monitored and Suction available Patient Re-evaluated:Patient Re-evaluated prior to induction Oxygen Delivery Method: Circle system utilized Preoxygenation: Pre-oxygenation with 100% oxygen Induction Type: IV induction Ventilation: Mask ventilation without difficulty Laryngoscope Size: Mac and 3 Grade View: Grade I Tube type: Oral Number of attempts: 1 Airway Equipment and Method: Stylet Placement Confirmation: positive ETCO2,  ETT inserted through vocal cords under direct vision and breath sounds checked- equal and bilateral Secured at: 21 cm Tube secured with: Tape Dental Injury: Teeth and Oropharynx as per pre-operative assessment  Comments: Intubation by SRNA K. Junious Dresser. Atraumatic intubation.

## 2020-04-18 NOTE — Transfer of Care (Signed)
Immediate Anesthesia Transfer of Care Note  Patient: Donna Baldwin  Procedure(s) Performed: LAPAROSCOPIC HELLER MYOTOMY WITH UPPER ENDOSCOPY (N/A Abdomen)  Patient Location: PACU  Anesthesia Type:General  Level of Consciousness: sedated  Airway & Oxygen Therapy: Patient Spontanous Breathing and Patient connected to face mask oxygen  Post-op Assessment: Report given to RN and Post -op Vital signs reviewed and stable  Post vital signs: stable  Last Vitals:  Vitals Value Taken Time  BP 131/69 04/18/20 1030  Temp    Pulse 63 04/18/20 1037  Resp 14 04/18/20 1037  SpO2 100 % 04/18/20 1037  Vitals shown include unvalidated device data.  Last Pain:  Vitals:   04/18/20 0611  TempSrc:   PainSc: 0-No pain      Patients Stated Pain Goal: 4 ([id number] [phone])  Complications: Patient re-intubated

## 2020-04-18 NOTE — Anesthesia Postprocedure Evaluation (Signed)
Anesthesia Post Note  Patient: Donna Baldwin  Procedure(s) Performed: LAPAROSCOPIC HELLER MYOTOMY WITH UPPER ENDOSCOPY (N/A Abdomen)     Patient location during evaluation: PACU Anesthesia Type: General Level of consciousness: awake Pain management: pain level controlled Vital Signs Assessment: post-procedure vital signs reviewed and stable Respiratory status: spontaneous breathing, nonlabored ventilation, respiratory function stable and patient connected to nasal cannula oxygen Cardiovascular status: blood pressure returned to baseline and stable Postop Assessment: no apparent nausea or vomiting Anesthetic complications: no    Last Vitals:  Vitals:   04/18/20 1200 04/18/20 1313  BP: 137/71 (!) 154/71  Pulse: 65 75  Resp: 17 14  Temp: (!) 36.3 C 36.6 C  SpO2: 100% 100%    Last Pain:  Vitals:   04/18/20 1313  TempSrc: Oral  PainSc: 0-No pain                 Vantasia Pinkney P Kathleen Likins

## 2020-04-19 LAB — CBC
HCT: 35 % — ABNORMAL LOW (ref 36.0–46.0)
Hemoglobin: 10.3 g/dL — ABNORMAL LOW (ref 12.0–15.0)
MCH: 22.6 pg — ABNORMAL LOW (ref 26.0–34.0)
MCHC: 29.4 g/dL — ABNORMAL LOW (ref 30.0–36.0)
MCV: 76.9 fL — ABNORMAL LOW (ref 80.0–100.0)
Platelets: 108 10*3/uL — ABNORMAL LOW (ref 150–400)
RBC: 4.55 MIL/uL (ref 3.87–5.11)
RDW: 15.4 % (ref 11.5–15.5)
WBC: 6.5 10*3/uL (ref 4.0–10.5)
nRBC: 0 % (ref 0.0–0.2)

## 2020-04-19 LAB — BASIC METABOLIC PANEL
Anion gap: 8 (ref 5–15)
BUN: 7 mg/dL — ABNORMAL LOW (ref 8–23)
CO2: 25 mmol/L (ref 22–32)
Calcium: 9.2 mg/dL (ref 8.9–10.3)
Chloride: 108 mmol/L (ref 98–111)
Creatinine, Ser: 0.72 mg/dL (ref 0.44–1.00)
GFR calc Af Amer: >60 mL/min (ref >60)
GFR calc non Af Amer: >60 mL/min (ref >60)
Glucose, Bld: 136 mg/dL — ABNORMAL HIGH (ref 70–99)
Potassium: 3.9 mmol/L (ref 3.5–5.1)
Sodium: 141 mmol/L (ref 135–145)

## 2020-04-19 MED ORDER — ENSURE ENLIVE PO LIQD: 237.0000 mL | Freq: Two times a day (BID) | ORAL | Status: DC

## 2020-04-19 NOTE — Progress Notes (Signed)
Progress Note: General Surgery Service   Chief Complaint/Subjective: Tolerating liquids well, minimal abdominal discomfort, no nausea, ambulating well  Objective: Vital signs in last 24 hours: Temp:  [96.5 F (35.8 C)-98.7 F (37.1 C)] 98 F (36.7 C) (04/21 1000) Pulse Rate:  [41-80] 45 (04/21 1000) Resp:  [14-19] 16 (04/21 1000) BP: (114-177)/(58-89) 114/58 (04/21 1000) SpO2:  [97 %-100 %] 98 % (04/21 1000) Last BM Date: 04/16/20  Intake/Output from previous day: 04/20 0701 - 04/21 0700 In: 4128.4 [P.O.:840; I.V.:3188.4; IV Piggyback:100] Out: [id number] [Urine:4151; Blood:25] Intake/Output this shift: Total I/O In: 240 [P.O.:240] Out: 700 [Urine:700]  Gen: NAD  Resp: nonlabored  Card: bradycardic  Abd: incisions c/d/i, nontender  Lab Results: CBC  Recent Labs    04/18/20 1335 04/19/20 0508  WBC 7.6 6.5  HGB 12.0 10.3*  HCT 40.0 35.0*  PLT 124* 108*   BMET Recent Labs    04/18/20 1335 04/19/20 0508  NA  --  141  K  --  3.9  CL  --  108  CO2  --  25  GLUCOSE  --  136*  BUN  --  7*  CREATININE 0.62 0.72  CALCIUM  --  9.2   PT/INR No results for input(s): LABPROT, INR in the last 72 hours. ABG No results for input(s): PHART, HCO3 in the last 72 hours.  Invalid input(s): PCO2, PO2  Anti-infectives: Anti-infectives (From admission, onward)   Start     Dose/Rate Route Frequency Ordered Stop   04/18/20 0730  clindamycin (CLEOCIN) IVPB 600 mg     600 mg 100 mL/hr over 30 Minutes Intravenous  Once 04/18/20 0727 04/18/20 0800      Medications: Scheduled Meds: . enoxaparin (LOVENOX) injection  40 mg Subcutaneous Q24H  . feeding supplement (ENSURE ENLIVE)  237 mL Oral BID BM   Continuous Infusions: . dextrose 5 % and 0.45% NaCl 50 mL/hr at 04/19/20 0612   PRN Meds:.HYDROmorphone (DILAUDID) injection, ibuprofen  Assessment/Plan: s/p Procedure(s): LAPAROSCOPIC HELLER MYOTOMY WITH UPPER ENDOSCOPY 04/18/2020 -advance to full liquid  diet -ambulate -ensure    LOS: 1 day   Mickeal Skinner, MD [facility]

## 2020-04-19 NOTE — TOC Transition Note (Deleted)
Transition of Care [facility]) - CM/SW Discharge Note   Patient Details  Name: Donna Baldwin MRN: [id number] Date of Birth: February 04, 1941  [facility] CM/SW Contact:  Leeroy Cha, RN Phone Number: 04/19/2020, 9:20 AM   Clinical Narrative:    [facility] at [facility] will call [employer] for auth number this amj.     Barriers to Discharge: Continued Medical Work up   Patient Goals and CMS Choice Patient states their goals for this hospitalization and ongoing recovery are:: to eventually go home CMS Medicare.gov Compare Post Acute Care list provided to:: Patient Choice offered to / list presented to : Patient  Discharge Placement                       Discharge Plan and Services     Post Acute Care Choice: [place]                               Social Determinants of Health (SDOH) Interventions     Readmission Risk Interventions No flowsheet data found.

## 2020-04-20 MED ORDER — IBUPROFEN 600 MG PO TABS: 600.0000 mg | ORAL_TABLET | Freq: Four times a day (QID) | ORAL | 0 refills | Status: DC | PRN

## 2020-04-20 NOTE — Plan of Care (Signed)
Patient discharged home in stable condition 

## 2020-05-12 NOTE — Discharge Summary (Signed)
Physician Discharge Summary  ALTIE BUSA [id number] DOB: 1941/08/30 DOA: 04/18/2020  PCP: Harlan Stains, MD  Admit date: 04/18/2020 Discharge date: 04/20/2020  Recommendations for Outpatient Follow-up:  1. (include homehealth, outpatient follow-up instructions, specific recommendations for PCP to follow-up on, etc.)  Follow-up Information    Koda Defrank, Arta Bruce, MD Follow up in 3 week(s).   Specialty: General Surgery Contact information: [place] [address] [phone]          Discharge Diagnoses:  Active Problems:   Achalasia   Surgical Procedure: laparoscopic heller myotomy  Discharge Condition: Good Disposition: Home  Diet recommendation: liquid diet   Hospital Course:  79 yo female underwent laparoscopic heller myotomy. She was admitted post op. She underwent UGI study showing no leak and good result of surgery. She was slowly advanced diet to full liquids and discharged home POD   Discharge Instructions  Discharge Instructions    Call MD for:  difficulty breathing, headache or visual disturbances   Complete by: As directed    Call MD for:  persistant nausea and vomiting   Complete by: As directed    Call MD for:  redness, tenderness, or signs of infection (pain, swelling, redness, odor or green/yellow discharge around incision site)   Complete by: As directed    Call MD for:  severe uncontrolled pain   Complete by: As directed    Call MD for:  temperature >100.4   Complete by: As directed    Diet full liquid   Complete by: As directed    Discharge wound care:   Complete by: As directed    Glue may peel off in next 2 weeks. Ok to shower today   Increase activity slowly   Complete by: As directed      Allergies as of 04/20/2020      Reactions   Acetaminophen Other (See Comments)   Tremor, anxiety   Beef-derived Products Anaphylaxis   Codeine Hives, Anxiety   Epinephrine Palpitations   Hydralazine Hives,  Shortness Of Breath   Iodine Anaphylaxis   Ivp Dye [iodinated Diagnostic Agents] Anaphylaxis, Other (See Comments)   Latex Rash   Morphine And Related Other (See Comments)   Tremors, increased heart rate, excitement, confusion per pt   Penicillins Hives   Has patient had a PCN reaction causing immediate rash, facial/tongue/throat swelling, SOB or lightheadedness with hypotension: Yes Has patient had a PCN reaction causing severe rash involving mucus membranes or skin necrosis: Yes Has patient had a PCN reaction that required hospitalization: Yes Has patient had a PCN reaction occurring within the last 10 years: No If all of the above answers are "NO", then may proceed with Cephalosporin use.   Sertraline Other (See Comments)   Numbness in mouth and hyperactivity   Shellfish Allergy Hives   Solu-medrol [methylprednisolone] Other (See Comments)   Numbness and tingling in mouth   Sulfa Antibiotics Hives, Rash   Barbiturates Other (See Comments)   Excitement   Bee Pollen Hives   Bee Venom Hives   Diovan [valsartan] Nausea Only   Gabapentin Other (See Comments)   Visual disturbance   Livalo [pitavastatin] Swelling, Other (See Comments)   Facial swelling   Other Palpitations, Other (See Comments)   NARCOTIC ANALGESICS-TREMORS, INCREASED HEART RATE Hmg-Coa Reductase Inhibitors - intolerance unknown   Oxycodone Swelling, Rash, Cough   Promethazine-dm Other (See Comments)   "Felt funny"   Tape Rash, Other (See Comments)   Red blotches   Wellness  Essentials Blood Sugr [nutritional Supplements] Other (See Comments)   Nuts, strawberries, bicarbonate of soda,   Albuterol Other (See Comments)   Caused wheezing   Famotidine Other (See Comments)   Unknown reaction   Pred Forte [prednisolone Acetate] Other (See Comments)   Patient is seeing odd color and images while using this   Celebrex [celecoxib] Swelling, Other (See Comments)   Swelling and numbness (mouth)   Lanolin Itching   Mobic  [meloxicam] Swelling, Other (See Comments)   Swelling and numbness in mouth   Nickel Rash   Nitrates, Organic Rash, Other (See Comments)   Chest tightness also   Soy Allergy Rash   Strawberry Extract Rash      Medication List    STOP taking these medications   magnesium hydroxide 400 MG/5ML suspension Commonly known as: MILK OF MAGNESIA   OVER THE COUNTER MEDICATION   psyllium 58.6 % powder Commonly known as: METAMUCIL     TAKE these medications   amLODipine 10 MG tablet Commonly known as: NORVASC Take 1 tablet (10 mg total) by mouth daily. What changed: Another medication with the same name was removed. Continue taking this medication, and follow the directions you see here.   aspirin 81 MG tablet Take 81 mg by mouth daily as needed (for pain and pressure).   Bystolic 10 MG tablet Generic drug: nebivolol TAKE 1 TABLET BY MOUTH EVERY DAY What changed: how much to take   cetirizine 10 MG tablet Commonly known as: ZYRTEC Take 10 mg by mouth daily as needed for allergies.   ENSURE CLEAR PO Take 237 mLs by mouth daily.   EPINEPHrine 0.3 mg/0.3 mL Soaj injection Commonly known as: EPI-PEN Inject 0.3 mg into the muscle once as needed (for anaphylaxis).   ibuprofen 600 MG tablet Commonly known as: ADVIL Take 1 tablet (600 mg total) by mouth every 6 (six) hours as needed. What changed:   medication strength  how much to take  when to take this  reasons to take this   LORazepam 0.5 MG tablet Commonly known as: ATIVAN Take 0.5 mg by mouth daily as needed for anxiety.   pantoprazole 40 MG tablet Commonly known as: PROTONIX Take 40 mg by mouth daily as needed for heartburn.   Repatha 140 MG/ML Sosy Generic drug: Evolocumab Inject 140 mg into the skin every 14 (fourteen) days.   TUMS CHEWY BITES PO Take 1 tablet by mouth daily as needed (acid reflux).            Discharge Care Instructions  (From admission, onward)         Start     Ordered    04/20/20 0000  Discharge wound care:    Comments: Glue may peel off in next 2 weeks. Ok to shower today   04/20/20 0754         Follow-up Information    Jere Bostrom, Arta Bruce, MD Follow up in 3 week(s).   Specialty: General Surgery Contact information: [place] [address] [phone]            The results of significant diagnostics from this hospitalization (including imaging, microbiology, ancillary and laboratory) are listed below for reference.    Significant Diagnostic Studies: No results found.  Labs: Basic Metabolic Panel: No results for input(s): NA, K, CL, CO2, GLUCOSE, BUN, CREATININE, CALCIUM, MG, PHOS in the last 168 hours. Liver Function Tests: No results for input(s): AST, ALT, ALKPHOS, BILITOT, PROT, ALBUMIN in the last  168 hours.  CBC: No results for input(s): WBC, NEUTROABS, HGB, HCT, MCV, PLT in the last 168 hours.  CBG: No results for input(s): GLUCAP in the last 168 hours.  Active Problems:   Achalasia   Time coordinating discharge: 15 min

## 2020-05-24 ENCOUNTER — Ambulatory Visit
Admission: RE | Admit: 2020-05-24 | Discharge: 2020-05-24 | Disposition: A | Discharge status: Home / Self Care | Payer: Medicare HMO | Source: Ambulatory Visit | Attending: General Surgery | Admitting: General Surgery

## 2020-05-24 ENCOUNTER — Other Ambulatory Visit: Payer: Self-pay | Admitting: General Surgery

## 2020-05-24 DIAGNOSIS — R0600 Dyspnea, unspecified: Secondary | ICD-10-CM

## 2020-05-26 ENCOUNTER — Emergency Department (HOSPITAL_COMMUNITY)
Admission: EM | Admit: 2020-05-26 | Discharge: 2020-05-26 | Disposition: A | Discharge status: Home / Self Care | Payer: Medicare HMO | Attending: Emergency Medicine | Admitting: Emergency Medicine

## 2020-05-26 ENCOUNTER — Other Ambulatory Visit: Payer: Self-pay

## 2020-05-26 ENCOUNTER — Encounter (HOSPITAL_COMMUNITY): Payer: Self-pay | Admitting: Emergency Medicine

## 2020-05-26 DIAGNOSIS — Z5321 Procedure and treatment not carried out due to patient leaving prior to being seen by health care provider: Secondary | ICD-10-CM | POA: Insufficient documentation

## 2020-05-26 DIAGNOSIS — M549 Dorsalgia, unspecified: Secondary | ICD-10-CM | POA: Diagnosis not present

## 2020-05-26 NOTE — ED Triage Notes (Signed)
Pt reports was sitting on a pillow on a stool and fell off hitting her back on the dresser. Pt c/o back pains 8/10.

## 2020-05-27 DIAGNOSIS — S3992XA Unspecified injury of lower back, initial encounter: Secondary | ICD-10-CM | POA: Diagnosis not present

## 2020-05-27 DIAGNOSIS — S299XXA Unspecified injury of thorax, initial encounter: Secondary | ICD-10-CM | POA: Diagnosis not present

## 2020-06-26 ENCOUNTER — Other Ambulatory Visit: Payer: Self-pay | Admitting: Cardiology

## 2020-07-27 ENCOUNTER — Ambulatory Visit (INDEPENDENT_AMBULATORY_CARE_PROVIDER_SITE_OTHER): Payer: Medicare HMO | Admitting: Allergy & Immunology

## 2020-07-27 ENCOUNTER — Other Ambulatory Visit: Payer: Self-pay

## 2020-07-27 ENCOUNTER — Encounter: Payer: Self-pay | Admitting: Allergy & Immunology

## 2020-07-27 VITALS — BP 136/82 | HR 103 | Temp 97.8°F | Resp 16 | Ht 64.0 in | Wt 112.0 lb

## 2020-07-27 DIAGNOSIS — T7840XD Allergy, unspecified, subsequent encounter: Secondary | ICD-10-CM | POA: Diagnosis not present

## 2020-07-27 DIAGNOSIS — J310 Chronic rhinitis: Secondary | ICD-10-CM | POA: Diagnosis not present

## 2020-07-27 DIAGNOSIS — K9049 Malabsorption due to intolerance, not elsewhere classified: Secondary | ICD-10-CM

## 2020-07-27 DIAGNOSIS — Z889 Allergy status to unspecified drugs, medicaments and biological substances status: Secondary | ICD-10-CM | POA: Diagnosis not present

## 2020-07-27 NOTE — Progress Notes (Signed)
NEW PATIENT  Date of Service/Encounter:  07/27/20  Referring provider: Harlan Stains, MD   Assessment:   Allergic reaction - with concern for vaccine allergy  Concern for chronic granulomatous disease (stems from a CXR report) - with a normal recent CXR and no history of recurrent infections  Multiple antibiotic allergies  P/SAR - controlled with avoidance measures for the most part  Food sensitizations - without a clear allergic history  Complicated past medical history, including achalasia  Plan/Recommendations:   1. Concern for vaccine allergy - We are going to schedule you for COVID vaccine component testing. - We are going to get some lab work as well: serum tryptase (to look for mast cell disease).  - The predictive value of the testing is not known, so I cannot guarantee that you will tolerate the vaccine, but it is one data point we can use to make a decision about getting the vaccination.    2. Concern for granulomatous disease - Your recent chest X-ray was normal.  - You have no history of infections, so I do not think that you have chronic granulomatous disease. - I do not think that testing is needed at this time. - I think the radiologist was just listing off possible diagnoses in his report.  3. Return in about 4 weeks (around 08/24/2020) for COVID19 vaccine component testing. This can be an in-person, a virtual Webex or a telephone follow up visit.  Subjective:   Donna Baldwin is a 79 y.o. female presenting today for evaluation of  Chief Complaint  Patient presents with  . Allergic Reaction    Donna Baldwin has a history of the following: Patient Active Problem List   Diagnosis Date Noted  . Achalasia 04/18/2020  . Abnormal CT of liver 12/20/2019  . Atherosclerosis of native coronary artery of native heart without angina pectoris 12/20/2019  . Palpitation 02/08/2019  . Nonintractable headache   . TIA (transient ischemic attack)  07/06/2018  . Hypertensive urgency 07/06/2018  . History of diabetes mellitus 04/01/2017  . 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 Hereditary and idiopathic peripheral neuropathy 07/07/2014  . Aortic atherosclerosis ([place]) 12/03/2013  . Mixed hyperlipidemia 12/03/2013  . Essential hypertension 12/03/2013  . GERD (gastroesophageal reflux disease) 03/24/2013  . Obstructive sleep apnea, ruled out 02/28/2013  . 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 Urticaria due to food allergy 08/31/2012  . Thrombocytopenia ([place])     History obtained from: chart review and patient and daughter.  Donna Baldwin was referred by Donna Stains, MD.     Exa is a 79 y.o. female presenting for an evaluation of a possible COVID vaccine allergy.   The last shot she got was a Tetanus shot. She had some localized swelling that took several months to clear up. This was 3-4 years ago. She had no hives or anaphylaxis. She does not get influenza vaccinations. She does tolerate gelatin without a problem.   Allergic Rhinitis Symptom History: She has the most symptoms with grass expsoure. She does not react well to trees or chemical irritants.  She had environmental allergy testing performed in September 2013 via the blood that was positive to dust mites as well as grass, indoor and outdoor molds, cat, dog, trees, and weeds as well as ragweed.    Food Allergy Symptom History: She has never had a classic anaphylactic reaction to any food at all, but she has a number of foods that she avoids for unclear  reasons. She did have food allergy performed in September 2013 via a blood panel that demonstrated positives to wheat, peanut, soy, corn, tomato, orange, apple, shrimp. This is perhaps where her food avoidances come from. It seems that she has a very limited diet, both owing to her "allergies" and her achalasia.    She has a recent diagnosis of achalasia. Her last colonoscopy was in 38s. This  was normal evidently. She has not had another one due to the achalasia. She did do fine with the bowel prep. She did try Miralax, but she tells me that her "body insides make [her] sick". She has as large bottle at home and has tried this twice. She did not have wheezing or throat swelling. She was sick to her stomach. She does tolerate magnesium sulfate. She uses Colace without a problem.   She has a history of "pulmonary granulomas". She interpreted this as a chronic granulomouatous disease. She had this CXR done in 2017 and never had this worked up at all. This seems to have been listed as a finding on the one CXR. I assume that this was listed in the differential of the radiology report. She has been under the impression that she has chronic granulomatous disease.  However, it should be noted that she does not remember the last time that she needed antibiotics and has no history of recurrent abscesses or granulomas in other locations.  She never had any kind of blood work-up to confirm this.  She has never been hospitalized for an infection.  She had a CXR in [place] that was negative without evidence of granulomas. She has never seen an Engineer, structural.   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 Now that she is retired, she spends time with her husband and children.   Otherwise, there is no history of other atopic diseases, including . There is no significant infectious history. Vaccinations are up to date.    Past Medical History: Patient Active Problem List   Diagnosis Date Noted  . Achalasia 04/18/2020  . Abnormal CT of liver 12/20/2019  . Atherosclerosis of native coronary artery of native heart without angina pectoris 12/20/2019  . Palpitation 02/08/2019  . Nonintractable headache   . TIA (transient ischemic attack) 07/06/2018  . Hypertensive urgency 07/06/2018  . History of diabetes mellitus 04/01/2017  . Transient  retinal artery occlusion of both eyes 07/07/2014  . Hereditary and idiopathic peripheral neuropathy 07/07/2014  . Aortic atherosclerosis ([place]) 12/03/2013  . Mixed hyperlipidemia 12/03/2013  . Essential hypertension 12/03/2013  . GERD (gastroesophageal reflux disease) 03/24/2013  . Obstructive sleep apnea, ruled out 02/28/2013  . 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 Urticaria due to food allergy 08/31/2012  . Thrombocytopenia ([place])     Medication List:  Allergies as of 07/27/2020      Reactions   Acetaminophen Other (See Comments)   Tremor, anxiety   Beef-derived Products Anaphylaxis   Codeine Hives, Anxiety   Hydralazine Hives, Shortness Of Breath   Iodine Anaphylaxis   Ivp Dye [iodinated Diagnostic Agents] Anaphylaxis, Other (See Comments)   Latex Rash   Morphine And Related Other (See Comments)   Tremors, increased heart rate, excitement, confusion per pt   Penicillins Hives   Has patient had a PCN reaction causing immediate rash, facial/tongue/throat swelling, SOB or lightheadedness with hypotension: Yes Has patient had a PCN reaction causing severe rash involving mucus membranes or skin necrosis: Yes Has patient  had a PCN reaction that required hospitalization: Yes Has patient had a PCN reaction occurring within the last 10 years: No If all of the above answers are "NO", then may proceed with Cephalosporin use.   Sertraline Other (See Comments)   Numbness in mouth and hyperactivity   Shellfish Allergy Hives   Solu-medrol [methylprednisolone] Other (See Comments)   Numbness and tingling in mouth   Sulfa Antibiotics Hives, Rash   Barbiturates Other (See Comments)   Excitement   Bee Pollen Hives   Bee Venom Hives   Diovan [valsartan] Nausea Only   Gabapentin Other (See Comments)   Visual disturbance   Livalo [pitavastatin] Swelling, Other (See Comments)   Facial swelling   Other Palpitations, Other (See Comments)   NARCOTIC ANALGESICS-TREMORS, INCREASED HEART  RATE Hmg-Coa Reductase Inhibitors - intolerance unknown   Oxycodone Swelling, Rash, Cough   Promethazine-dm Other (See Comments)   "Felt funny"   Tape Rash, Other (See Comments)   Red blotches   Wellness Essentials Blood Sugr [nutritional Supplements] Other (See Comments)   Nuts, strawberries, bicarbonate of soda,   Albuterol Other (See Comments)   Caused wheezing   Famotidine Other (See Comments)   Unknown reaction   Pred Forte [prednisolone Acetate] Other (See Comments)   Patient is seeing odd color and images while using this   Celebrex [celecoxib] Swelling, Other (See Comments)   Swelling and numbness (mouth)   Lanolin Itching   Mobic [meloxicam] Swelling, Other (See Comments)   Swelling and numbness in mouth   Nickel Rash   Nitrates, Organic Rash, Other (See Comments)   Chest tightness also   Soy Allergy Rash   Strawberry Extract Rash      Medication List       Accurate as of July 27, 2020  2:24 PM. If you have any questions, ask your nurse or doctor.        amLODipine 10 MG tablet Commonly known as: NORVASC Take 1 tablet (10 mg total) by mouth daily.   aspirin 81 MG tablet Take 81 mg by mouth daily as needed (for pain and pressure).   Bystolic 10 MG tablet Generic drug: nebivolol TAKE 1 TABLET BY MOUTH EVERY DAY What changed: how much to take   cetirizine 10 MG tablet Commonly known as: ZYRTEC Take 10 mg by mouth daily as needed for allergies.   ENSURE CLEAR PO Take 237 mLs by mouth daily.   EPINEPHrine 0.3 mg/0.3 mL Soaj injection Commonly known as: EPI-PEN Inject 0.3 mg into the muscle once as needed (for anaphylaxis).   ibuprofen 600 MG tablet Commonly known as: ADVIL Take 1 tablet (600 mg total) by mouth every 6 (six) hours as needed.   LORazepam 0.5 MG tablet Commonly known as: ATIVAN Take 0.5 mg by mouth daily as needed for anxiety.   pantoprazole 40 MG tablet Commonly known as: PROTONIX Take 40 mg by mouth daily as needed for  heartburn.   Repatha 140 MG/ML Sosy Generic drug: Evolocumab Inject 140 mg into the skin every 14 (fourteen) days.   TUMS CHEWY BITES PO Take 1 tablet by mouth daily as needed (acid reflux).       Birth History: non-contributory  Developmental History: non-contributory  Past Surgical History: Past Surgical History:  Procedure Laterality Date  . ESOPHAGEAL MANOMETRY N/A 03/15/2020   Procedure: ESOPHAGEAL MANOMETRY (EM);  Surgeon: Wilford Corner, MD;  Location: [facility]COPY;  Service: Endoscopy;  Laterality: N/A;  . EYE SURGERY    . HELLER MYOTOMY N/A 04/18/2020  Procedure: LAPAROSCOPIC HELLER MYOTOMY WITH UPPER ENDOSCOPY;  Surgeon: Kinsinger, Arta Bruce, MD;  Location: [facility] ORS;  Service: General;  Laterality: N/A;  . [place] IMPEDANCE STUDY N/A 03/15/2020   Procedure: [place] IMPEDANCE STUDY;  Surgeon: Wilford Corner, MD;  Location: [facility];  Service: Endoscopy;  Laterality: N/A;  . SHOULDER SURGERY Bilateral   . TONSILLECTOMY       Family History: Family History  Problem Relation Age of Onset  . Cancer Father   . Allergic rhinitis Neg Hx   . Angioedema Neg Hx   . Asthma Neg Hx   . Atopy Neg Hx   . Eczema Neg Hx   . Immunodeficiency Neg Hx   . Urticaria Neg Hx      Social History: Braya lives at home with her husband.  She lives in a house that is 39 years old.  There is carpeting throughout the home.  She has electric heating with central cooling.  She also has a heat pump.  There are no animals inside or outside of the home.  She does have dust mite covers on the bedding, including her pillows.  There is no cigarette exposure or tobacco exposure at all.  She is currently retired.   Review of Systems  Constitutional: Positive for malaise/fatigue and weight loss. Negative for chills and fever.  HENT: Negative.  Negative for congestion, ear discharge, ear pain and sinus pain.   Eyes: Negative for pain, discharge and redness.  Respiratory: Positive for shortness of  breath. Negative for cough, sputum production, wheezing and stridor.   Cardiovascular: Negative.  Negative for chest pain and palpitations.  Gastrointestinal: Positive for abdominal pain, heartburn and nausea. Negative for constipation, diarrhea and vomiting.  Skin: Negative.  Negative for itching and rash.  Neurological: Negative for dizziness and headaches.  Endo/Heme/Allergies: Positive for environmental allergies. Does not bruise/bleed easily.       Objective:   Blood pressure (!) 136/82, pulse 103, temperature 97.8 F (36.6 C), temperature source Temporal, resp. rate 16, height 5\' 4"  (1.626 m), weight 112 lb (50.8 kg), SpO2 98 %. Body mass index is 19.22 kg/m.   Physical Exam:   Physical Exam Constitutional:      Appearance: She is well-developed.     Comments: Talkative. Frail.   HENT:     Head: Normocephalic and atraumatic.     Right Ear: Tympanic membrane, ear canal and external ear normal. No drainage, swelling or tenderness. Tympanic membrane is not injected, scarred, erythematous, retracted or bulging.     Left Ear: Tympanic membrane, ear canal and external ear normal. No drainage, swelling or tenderness. Tympanic membrane is not injected, scarred, erythematous, retracted or bulging.     Nose: No nasal deformity, septal deviation, mucosal edema or rhinorrhea.     Right Turbinates: Enlarged and pale.     Left Turbinates: Enlarged and pale.     Right Sinus: No maxillary sinus tenderness or frontal sinus tenderness.     Left Sinus: No maxillary sinus tenderness or frontal sinus tenderness.     Mouth/Throat:     Mouth: Mucous membranes are not pale and not dry.     Pharynx: Uvula midline.     Comments: No cobblestoning present.  Eyes:     General:        Right eye: No discharge.        Left eye: No discharge.     Conjunctiva/sclera: Conjunctivae normal.     Right eye: Right conjunctiva is not injected. No chemosis.  Left eye: Left conjunctiva is not injected. No  chemosis.    Pupils: Pupils are equal, round, and reactive to light.  Cardiovascular:     Rate and Rhythm: Normal rate and regular rhythm.     Heart sounds: Normal heart sounds.  Pulmonary:     Effort: Pulmonary effort is normal. No tachypnea, accessory muscle usage or respiratory distress.     Breath sounds: Normal breath sounds. No wheezing, rhonchi or rales.     Comments: Moving air well in all lung fields. No increased work of breathing noted. Chest:     Chest wall: No tenderness.  Abdominal:     Tenderness: There is no abdominal tenderness. There is no guarding or rebound.  Lymphadenopathy:     Head:     Right side of head: No submandibular, tonsillar or occipital adenopathy.     Left side of head: No submandibular, tonsillar or occipital adenopathy.     Cervical: No cervical adenopathy.  Skin:    Coloration: Skin is not pale.     Findings: No abrasion, erythema, petechiae or rash. Rash is not papular, urticarial or vesicular.     Comments: No eczematous or urticarial lesions.   Neurological:     Mental Status: She is alert.      Diagnostic studies: labs sent instead     Salvatore Marvel, MD [facility]

## 2020-07-27 NOTE — Patient Instructions (Addendum)
1. Concern for vaccine allergy - We are going to schedule you for COVID vaccine component testing. - We are going to get some lab work as well: serum tryptase (to look for mast cell disease).  - The predictive value of the testing is not known, so I cannot guarantee that you will tolerate the vaccine, but it is one data point we can use to make a decision about getting the vaccination.    2. Concern for granulomatous disease - Your recent chest X-ray was normal.  - You have no history of infections, so I do not think that you have chronic granulomatous disease. - I do not think that testing is needed at this time. - I think the radiologist was just listing off possible diagnoses in his report.  3. Return in about 4 weeks (around 08/24/2020) for COVID19 vaccine component testing. This can be an in-person, a virtual Webex or a telephone follow up visit.   Please inform us of any Emergency Department visits, hospitalizations, or changes in symptoms. Call us before going to the ED for breathing or allergy symptoms since we might be able to fit you in for a sick visit. Feel free to contact us anytime with any questions, problems, or concerns.  It was a pleasure to meet you today!  Websites that have reliable patient information: 1. American Academy of Asthma, Allergy, and Immunology: www.aaaai.org 2. Food Allergy Research and Education (FARE): foodallergy.org 3. Mothers of Asthmatics: http://www.asthmacommunitynetwork.org 4. American College of Allergy, Asthma, and Immunology: www.acaai.org   COVID-19 Vaccine Information can be found at: ShippingScam.co.uk For questions related to vaccine distribution or appointments, please email [email or web address] or call [phone].     "Like" [place] on Facebook and Instagram for our latest updates!        Make sure you are registered to vote! If you have moved or changed any of your contact  information, you will need to get this updated before voting!  In some cases, you MAY be able to register to vote online: [email or web address]

## 2020-07-28 ENCOUNTER — Encounter: Payer: Self-pay | Admitting: Allergy & Immunology

## 2020-07-29 LAB — TRYPTASE: Tryptase: 3.1 ug/L (ref 2.2–13.2)

## 2020-09-11 ENCOUNTER — Other Ambulatory Visit: Payer: Self-pay | Admitting: Cardiology

## 2020-09-14 ENCOUNTER — Encounter: Payer: Self-pay | Admitting: Cardiology

## 2020-09-14 ENCOUNTER — Ambulatory Visit (INDEPENDENT_AMBULATORY_CARE_PROVIDER_SITE_OTHER): Payer: Medicare HMO | Admitting: Allergy & Immunology

## 2020-09-14 ENCOUNTER — Ambulatory Visit: Payer: Medicare HMO | Admitting: Cardiology

## 2020-09-14 ENCOUNTER — Other Ambulatory Visit: Payer: Self-pay

## 2020-09-14 ENCOUNTER — Ambulatory Visit
Admission: RE | Admit: 2020-09-14 | Discharge: 2020-09-14 | Disposition: A | Discharge status: Home / Self Care | Payer: Medicare HMO | Source: Ambulatory Visit | Attending: Allergy & Immunology | Admitting: Allergy & Immunology

## 2020-09-14 ENCOUNTER — Encounter: Payer: Self-pay | Admitting: Allergy & Immunology

## 2020-09-14 VITALS — BP 138/56 | HR 48 | Temp 97.6°F | Resp 16

## 2020-09-14 VITALS — BP 156/75 | HR 44 | Ht 64.0 in | Wt 111.0 lb

## 2020-09-14 DIAGNOSIS — I700 Atherosclerosis of aorta: Secondary | ICD-10-CM | POA: Diagnosis not present

## 2020-09-14 DIAGNOSIS — H9311 Tinnitus, right ear: Secondary | ICD-10-CM

## 2020-09-14 DIAGNOSIS — I2510 Atherosclerotic heart disease of native coronary artery without angina pectoris: Secondary | ICD-10-CM

## 2020-09-14 DIAGNOSIS — E782 Mixed hyperlipidemia: Secondary | ICD-10-CM

## 2020-09-14 DIAGNOSIS — R61 Generalized hyperhidrosis: Secondary | ICD-10-CM

## 2020-09-14 DIAGNOSIS — R5383 Other fatigue: Secondary | ICD-10-CM | POA: Diagnosis not present

## 2020-09-14 DIAGNOSIS — T7840XD Allergy, unspecified, subsequent encounter: Secondary | ICD-10-CM

## 2020-09-14 DIAGNOSIS — I10 Essential (primary) hypertension: Secondary | ICD-10-CM

## 2020-09-14 NOTE — Patient Instructions (Addendum)
1. Allergic reaction - needs COVID vaccine component testing - We did not do the testing since I was worried about your diastolic number on your blood pressure (bottom number). - We did arrange for you to see your Cardiologist today at 11am. - Let's go ahead and reschedule for three weeks to give time for the blood pressure medication changes to take effect.   2. Return in about 3 weeks (around 10/05/2020).    Please inform us of any Emergency Department visits, hospitalizations, or changes in symptoms. Call us before going to the ED for breathing or allergy symptoms since we might be able to fit you in for a sick visit. Feel free to contact us anytime with any questions, problems, or concerns.  It was a pleasure to see you again today!  Websites that have reliable patient information: 1. American Academy of Asthma, Allergy, and Immunology: www.aaaai.org 2. Food Allergy Research and Education (FARE): foodallergy.org 3. Mothers of Asthmatics: http://www.asthmacommunitynetwork.org 4. American College of Allergy, Asthma, and Immunology: www.acaai.org   COVID-19 Vaccine Information can be found at: ShippingScam.[email or web address].uk For questions related to vaccine distribution or appointments, please email [email or web address] or call [phone].     Like [place] on [place] and Instagram for our latest updates!      Make sure you are registered to vote! If you have moved or changed any of your contact information, you will need to get this updated before voting!  In some cases, you MAY be able to register to vote online: [email or web address]

## 2020-09-14 NOTE — Progress Notes (Signed)
FOLLOW UP  Date of Service/Encounter:  09/14/20   Assessment:   Allergic reaction  Night sweats  Tinnitus of right ear - with right OME  Low diastolic blood pressure reading  Plan/Recommendations:   1. Allergic reaction - needs COVID vaccine component testing - We did not do the testing since I was worried about your diastolic number on your blood pressure (bottom number). - We did arrange for you to see your Cardiologist today at 11am. - Let's go ahead and reschedule for three weeks to give time for the blood pressure medication changes to take effect.   2. Return in about 3 weeks (around 10/05/2020).   Subjective:   MAN BONNEAU is a 79 y.o. female presenting today for follow up of  Chief Complaint  Patient presents with  . COVID Component testing    AVRIANNA SMART has a history of the following: Patient Active Problem List   Diagnosis Date Noted  . Achalasia 04/18/2020  . Abnormal CT of liver 12/20/2019  . Atherosclerosis of native coronary artery of native heart without angina pectoris 12/20/2019  . Palpitation 02/08/2019  . Nonintractable headache   . TIA (transient ischemic attack) 07/06/2018  . Hypertensive urgency 07/06/2018  . History of diabetes mellitus 04/01/2017  . 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 Hereditary and idiopathic peripheral neuropathy 07/07/2014  . Aortic atherosclerosis ([place]) 12/03/2013  . Mixed hyperlipidemia 12/03/2013  . Essential hypertension 12/03/2013  . GERD (gastroesophageal reflux disease) 03/24/2013  . Obstructive sleep apnea, ruled out 02/28/2013  . Urticaria due to food allergy 08/31/2012  . Thrombocytopenia ([place])     History obtained from: chart review and patient and her husband.   Maragret is a 79 y.o. female presenting for skin testing.  She was last seen in July 2021.  At that time, there was concern for vaccine allergy with her history.  We decided to schedule her for a  vaccine component testing.  She has a history of multiple medication allergies as well as multiple food sensitizations.  She also was under the impression that she had chronic granulomatous disease, which seem to stem from a chest x-ray report, but she has had a normal chest x-ray no history of recurrent infections.  We did not do a further work-up.  Since last visit, she has done well.  She is nervous about the testing today.  Unfortunately, after doing vitals, it seemed that we can achieve the testing.  Her diastolic never really got above 60, which was concerning for this.  Review of her past blood pressures show that she has been around this range before.  However, since we are going to be doing testing today, we wanted to make sure it was a safe processes we can make it for her.  We did call her cardiologist and get her an appointment today to possibly adjust her blood pressure medication.  Otherwise, there have been no changes to her past medical history, surgical history, family history, or social history.    Review of Systems  Constitutional: Positive for weight loss. Negative for fever and malaise/fatigue.  HENT: Positive for ear pain and tinnitus. Negative for congestion and ear discharge.   Eyes: Negative for pain, discharge and redness.  Respiratory: Negative for cough, sputum production, shortness of breath and wheezing.   Cardiovascular: Negative.  Negative for chest pain and palpitations.  Gastrointestinal: Negative for abdominal pain, constipation, diarrhea, heartburn, nausea and vomiting.  Skin: Negative.  Negative for itching  and rash.  Neurological: Positive for headaches. Negative for dizziness.  Endo/Heme/Allergies: Negative for environmental allergies. Does not bruise/bleed easily.       Objective:   Blood pressure (!) 138/56, pulse (!) 48, temperature 97.6 F (36.4 C), temperature source Temporal, resp. rate 16, SpO2 99 %. There is no height or weight on file to  calculate BMI.   Physical Exam: Constitutional:      Appearance: She is well-developed.     Comments: Talkative. Frail.   HENT:     Head: Normocephalic and atraumatic.     Right Ear: Tympanic membrane, ear canal and external ear normal. Right OME. No drainage, swelling or tenderness. Tympanic membrane is not injected, scarred, erythematous, retracted or bulging.     Left Ear: Tympanic membrane, ear canal and external ear normal. No drainage, swelling or tenderness. Tympanic membrane is not injected, scarred, erythematous, retracted or bulging.     Nose: No nasal deformity, septal deviation, mucosal edema or rhinorrhea.     Right Turbinates: Enlarged and pale.     Left Turbinates: Enlarged and pale.     Right Sinus: No maxillary sinus tenderness or frontal sinus tenderness.     Left Sinus: No maxillary sinus tenderness or frontal sinus tenderness.     Mouth/Throat:     Mouth: Mucous membranes are not pale and not dry.     Pharynx: Uvula midline.     Comments: No cobblestoning present.  Eyes:     General:        Right eye: No discharge.        Left eye: No discharge.     Conjunctiva/sclera: Conjunctivae normal.     Right eye: Right conjunctiva is not injected. No chemosis.    Left eye: Left conjunctiva is not injected. No chemosis.    Pupils: Pupils are equal, round, and reactive to light.  Cardiovascular:     Rate and Rhythm: Normal rate and regular rhythm.     Heart sounds: Normal heart sounds.  Pulmonary:     Effort: Pulmonary effort is normal. No tachypnea, accessory muscle usage or respiratory distress.     Breath sounds: Normal breath sounds. No wheezing, rhonchi or rales.     Comments: Moving air well in all lung fields. No increased work of breathing noted. Skin:    Coloration: Skin is not pale.     Findings: No abrasion, erythema, petechiae or rash. Rash is not papular, urticarial or vesicular.     Comments: No eczematous or urticarial lesions.   Neurological:      Mental Status: She is alert.       Diagnostic studies: none      Salvatore Marvel, MD  [facility]

## 2020-09-14 NOTE — Progress Notes (Signed)
Patient is here for follow up visit.  Subjective:   @Patient  ID: Donna Baldwin, female    DOB: 1941/08/12, 79 y.o.   MRN: [id number]  Chief Complaint  Patient presents with  . Coronary Artery Disease  . Hypotension  . Fatigue    Coronary Artery Disease Pertinent negatives include no chest pain, leg swelling or palpitations.     79 year old African-American female with labile hypertension, asymptomatic bradycardia, type II diabetes mellitus, chronic dysphagia, interstitial cystitis.  Appointment was made today at the request of patient's allergy specialist.  Patient was reportedly undergoing allergy testing at their office prior to her Covid vaccine.  She was noted to be hypotensive, although I do not know the exact numbers performed at their office.  Patient is she showed me her home blood pressure monitoring numbers.  Guarding occasional radiating of SBP <100 mmHg in June/July, most of the readings are either normal or elevated.  However, she does report night sweats several times a week.  She has lost weight, reportedly down from 140 pounds to 111 pounds over period of time..  She has seen gastroenterology regarding achalasia and abnormal liver findings.  She reportedly underwent laparoscopic surgery for her achalasia.  Patient tells me that she was told her anemia and weight loss was related to her achalasia.  She has not been compliant with her Repatha, due to her other noncardiac medical issues as described above.  She wants to wait for a while before reconsidering Repatha.    Current Outpatient Medications on File Prior to Visit  Medication Sig Dispense Refill  . amLODipine (NORVASC) 10 MG tablet Take 1 tablet (10 mg total) by mouth daily. (Patient taking differently: Take 5 mg by mouth daily. ) 30 tablet 3  . aspirin 81 MG tablet Take 81 mg by mouth daily as needed (for pain and pressure).     . BYSTOLIC 10 MG tablet TAKE 1 TABLET BY MOUTH EVERY DAY 90 tablet 0  .  Calcium Carbonate Antacid (TUMS CHEWY BITES PO) Take 1 tablet by mouth daily as needed (acid reflux).    . cetirizine (ZYRTEC) 10 MG tablet Take 10 mg by mouth daily as needed for allergies.     [name] EPINEPHrine 0.3 mg/0.3 mL IJ SOAJ injection Inject 0.3 mg into the muscle once as needed (for anaphylaxis).   1  . ibuprofen (ADVIL) 600 MG tablet Take 1 tablet (600 mg total) by mouth every 6 (six) hours as needed. 30 tablet 0  . LORazepam (ATIVAN) 0.5 MG tablet Take 0.5 mg by mouth daily as needed for anxiety.    . Nutritional Supplements (ENSURE CLEAR PO) Take 237 mLs by mouth daily.     No current facility-administered medications on file prior to visit.    Cardiovascular studies:  EKG 09/14/2020: Sinus bradycardia 44 bpm  Nonspecific ST-T changes  CTA 12/08/2019: 1. Coronary artery calcium score 108 Agatston units. This places the patient in the 54th percentile for age and gender, suggesting intermediate risk for future cardiac events. 2.  Nonobstructive ostial left main calcified plaque.  Upper Abdomen: Scattered foci of hepatic dome arterial hyperenhancement are diminutive and likely small perfusion anomalies. Normal imaged portions of the spleen, stomach.  Musculoskeletal: Thoracic spondylosis.  IMPRESSION: 1.  No acute findings in the imaged extracardiac chest. 2.  Aortic Atherosclerosis (ICD10-I70.0). 3. Esophageal air fluid level suggests dysmotility or gastroesophageal reflux.   Event monitor 01/07/2019- 02/05/2019: Sinus bradycardia, lowest HR 32 bpm @ 3:55 AM Occasional PVC's correlate  with symptoms of fatigue. Highest HR 109 bpm.  Exercise Treadmill Stress Test 10/23/2018:  Indication: Bradycardia The patient exercised on Bruce protocol for 06:17 min. Patient achieved 7.50 METS and reached HR 110 bpm, which is 76 % of maximum age-predicted HR. Stress test terminated due to fatigue.   Exercise capacity was below average for age. HR Response to Exercise:  Sub-optimal secondary to limited exercise capacity BP Response to Exercise: Normal resting BP- appropriate response. Chest Pain: none. Arrhythmias: Occasional PVCs. Resting EKG demonstrates Normal sinus rhythm. ST Changes: With peak exercise there was no ST-T changes of ischemia.  Overall Impression: Submaximal stress test. Attenuated heart rate response without hypotension, lightheadedness Recommendations: Continue primary/secondary prevention. Consider alternate testing, if clinical suspicion for obstructive CAD is high.  Outside echocardiogram 07/06/2018: - Left ventricle: The cavity size was normal. Wall thickness was  normal. Systolic function was normal. The estimated ejection  fraction was in the range of 55% to 60%. Wall motion was normal;  there were no regional wall motion abnormalities. Doppler  parameters are consistent with abnormal left ventricular  relaxation (grade 1 diastolic dysfunction). - Left atrium: The atrium was mildly dilated. - Right atrium: The atrium was mildly dilated.   Impressions:   - Normal LV systolic function; mild diastolic dysfunction; mild  biatrial enlargement.  CT abdomen 06/2016: IMPRESSION: Nonspecific mild wall thickening of the distal stomach and duodenum can be seen with chronic gastritis or duodenitis. No evidence of bowel obstruction or perforation. Negative for abscess.  No other acute intra-abdominal or pelvic process by noncontrast CT. Aortoiliac atherosclerosis  Recent labs: 04/19/2020: Glucose 136, BUN/Cr 7/0.72. EGFR >60. Na/K 141/3.9.  H/H 10.3/35. MCV 76. Platelets 108  11/2019: Chol 250, TG 89, HDL 77, LDL 158  2019: TSH 31. normal   Review of Systems  Cardiovascular: Negative for chest pain, dyspnea on exertion, leg swelling, palpitations and syncope.       Objective:     Vitals:   09/14/20 1116 09/14/20 1121  BP: (!) 172/77 (!) 156/75  Pulse: (!) 52 (!) 44     Physical Exam Vitals and nursing  note reviewed.  Constitutional:      General: She is not in acute distress.    Appearance: She is underweight.  Neck:     Vascular: No JVD.  Cardiovascular:     Rate and Rhythm: Normal rate and regular rhythm.     Heart sounds: Normal heart sounds. No murmur heard.   Pulmonary:     Effort: Pulmonary effort is normal.     Breath sounds: Normal breath sounds. No wheezing or rales.         Assessment & Recommendations:   79 year old African-American female with labile hypertension, hyperlipidemia, nonobstructive CAD, type II diabetes mellitus, chronic dysphagia, interstitial cystitis,   Essential hypertension: Labile hypertension.  Blood pressure is elevated in the office today.  I do not see any contraindication, from blood pressure standpoint, to perform allergy testing and subsequently Covid vaccination if deemed appropriate.  Sinus bradycardia: Secondary to ongoing Bystolic use.  No indication for any further work-up.  Hyperlipidemia: Statin intolerant. Given mild nonobstructive CAD, recommend PCSK9 inhibitor.  She wants to hold off on.  I will check repeat lipid panel in 2 months and follow-up after that.  Mild nonobstructive CAD: Recommend medical management with Aspirin 81 mg, management of hypertension and hyperlipidemia.   Weight loss, night sweats: The symptoms are concerning.  She does not have any obvious lymphadenopathy on my physical exam.  I  defer gastroenterology work-up to her specialist.  Defer age-appropriate cancer screening to PCP.  Nigel Mormon, MD [facility]. [place] Pager: [phone] Office: [phone] If no answer Cell [phone]

## 2020-09-15 LAB — CBC WITH DIFFERENTIAL
Basophils Absolute: 0 10*3/uL (ref 0.0–0.2)
Basos: 1 %
EOS (ABSOLUTE): 0 10*3/uL (ref 0.0–0.4)
Eos: 1 %
Hematocrit: 41.9 % (ref 34.0–46.6)
Hemoglobin: 12.4 g/dL (ref 11.1–15.9)
Immature Grans (Abs): 0 10*3/uL (ref 0.0–0.1)
Immature Granulocytes: 0 %
Lymphocytes Absolute: 1.2 10*3/uL (ref 0.7–3.1)
Lymphs: 36 %
MCH: 22.6 pg — ABNORMAL LOW (ref 26.6–33.0)
MCHC: 29.6 g/dL — ABNORMAL LOW (ref 31.5–35.7)
MCV: 76 fL — ABNORMAL LOW (ref 79–97)
Monocytes Absolute: 0.3 10*3/uL (ref 0.1–0.9)
Monocytes: 8 %
Neutrophils Absolute: 1.9 10*3/uL (ref 1.4–7.0)
Neutrophils: 54 %
RBC: 5.49 x10E6/uL — ABNORMAL HIGH (ref 3.77–5.28)
RDW: 14.9 % (ref 11.7–15.4)
WBC: 3.4 10*3/uL (ref 3.4–10.8)

## 2020-09-15 LAB — LACTATE DEHYDROGENASE: LDH: 227 IU/L — ABNORMAL HIGH (ref 119–226)

## 2020-09-26 DIAGNOSIS — M79651 Pain in right thigh: Secondary | ICD-10-CM | POA: Diagnosis not present

## 2020-10-28 ENCOUNTER — Other Ambulatory Visit: Payer: Self-pay | Admitting: Cardiology

## 2020-10-31 ENCOUNTER — Other Ambulatory Visit (HOSPITAL_COMMUNITY): Payer: Self-pay | Admitting: Cardiology

## 2020-10-31 DIAGNOSIS — E782 Mixed hyperlipidemia: Secondary | ICD-10-CM | POA: Diagnosis not present

## 2020-11-01 LAB — LIPID PANEL
Chol/HDL Ratio: 2.6 ratio (ref 0.0–4.4)
Cholesterol, Total: 248 mg/dL — ABNORMAL HIGH (ref 100–199)
HDL: 94 mg/dL (ref >39)
LDL Chol Calc (NIH): 141 mg/dL — ABNORMAL HIGH (ref 0–99)
Triglycerides: 77 mg/dL (ref 0–149)
VLDL Cholesterol Cal: 13 mg/dL (ref 5–40)

## 2020-11-07 ENCOUNTER — Other Ambulatory Visit: Payer: Self-pay

## 2020-11-07 ENCOUNTER — Encounter: Payer: Self-pay | Admitting: Allergy & Immunology

## 2020-11-07 ENCOUNTER — Ambulatory Visit (INDEPENDENT_AMBULATORY_CARE_PROVIDER_SITE_OTHER): Payer: BC Managed Care – PPO | Admitting: Allergy & Immunology

## 2020-11-07 VITALS — BP 180/80 | HR 78 | Temp 98.1°F | Resp 18 | Ht 64.0 in

## 2020-11-07 DIAGNOSIS — T50B95D Adverse effect of other viral vaccines, subsequent encounter: Secondary | ICD-10-CM | POA: Diagnosis not present

## 2020-11-07 DIAGNOSIS — T7840XD Allergy, unspecified, subsequent encounter: Secondary | ICD-10-CM

## 2020-11-07 DIAGNOSIS — H9311 Tinnitus, right ear: Secondary | ICD-10-CM

## 2020-11-07 DIAGNOSIS — R61 Generalized hyperhidrosis: Secondary | ICD-10-CM

## 2020-11-07 NOTE — Patient Instructions (Addendum)
1. Concern for COVID vaccine reaction - You were negative to the testing for polyethylene glycol, which is present as a stabilizer in both the [employer] and [place] vaccinations. - We do want to have you do a MiraLAX challenge in the office to make sure that you can tolerate it without a problem. - We did not do the MiraLAX challenge since you have had cream of wheat this morning. - We will schedule the MiraLAX challenge on the same day as you get your Pfizer vaccination. - You were skin testing positive to triamcinolone, which contains polysorbate (a preservative in the [employer] vaccine). - Therefore you should avoid the [employer] vaccine and other injections with polysorbate in the future.  2. Follow up with Miralax challenge and Pfizer vaccine to follow.    Please inform us of any Emergency Department visits, hospitalizations, or changes in symptoms. Call us before going to the ED for breathing or allergy symptoms since we might be able to fit you in for a sick visit. Feel free to contact us anytime with any questions, problems, or concerns.  It was a pleasure to see you again today!  Websites that have reliable patient information: 1. American Academy of Asthma, Allergy, and Immunology: www.aaaai.org 2. Food Allergy Research and Education (FARE): foodallergy.org 3. Mothers of Asthmatics: http://www.asthmacommunitynetwork.org 4. American College of Allergy, Asthma, and Immunology: www.acaai.org   COVID-19 Vaccine Information can be found at: ShippingScam.co.uk For questions related to vaccine distribution or appointments, please email [email or web address] or call [phone].     "Like" [place] on Facebook and Instagram for our latest updates!     HAPPY FALL!     Make sure you are registered to vote! If you have moved or changed any of your contact information, you will need to get this updated before  voting!  In some cases, you MAY be able to register to vote online: [email or web address]

## 2020-11-07 NOTE — Progress Notes (Signed)
FOLLOW UP  Date of Service/Encounter:  11/07/20   Assessment:   Allergic reaction - with concern for vaccine allergy (testing positive to triamcinolone on the most concentrated intradermal injection)  Multiple antibiotic allergies  P/SAR - controlled with avoidance measures for the most part  Food sensitizations - without a clear allergic history  Complicated past medical history, including achalasia  Plan/Recommendations:   1. Concern for COVID vaccine reaction - You were negative to the testing for polyethylene glycol, which is present as a stabilizer in both [employer] and [place] vaccinations. - We do want to have you do a MiraLAX challenge in the office to make sure that you can tolerate it without a problem. - We did not do the MiraLAX challenge since you have had cream of wheat this morning. - We will schedule the MiraLAX challenge on the same day as you get your Pfizer vaccination. - You were skin testing positive to triamcinolone, which contains polysorbate (a preservative in the [employer] vaccine). - Therefore you should avoid the [employer] vaccine and other injections with polysorbate in the future.  2. Follow up with Miralax challenge and Pfizer vaccine to follow.   Subjective:   Donna Baldwin is a 79 y.o. female presenting today for follow up of  Chief Complaint  Patient presents with  . Food/Drug Challenge    component testing     Donna Baldwin has a history of the following: Patient Active Problem List   Diagnosis Date Noted  . Achalasia 04/18/2020  . Abnormal CT of liver 12/20/2019  . Atherosclerosis of native coronary artery of native heart without angina pectoris 12/20/2019  . Palpitation 02/08/2019  . Nonintractable headache   . TIA (transient ischemic attack) 07/06/2018  . Hypertensive urgency 07/06/2018  . History of diabetes mellitus 04/01/2017  . Transient retinal artery occlusion of both eyes  07/07/2014  . Hereditary and idiopathic peripheral neuropathy 07/07/2014  . Aortic atherosclerosis ([place]) 12/03/2013  . Mixed hyperlipidemia 12/03/2013  . Essential hypertension 12/03/2013  . GERD (gastroesophageal reflux disease) 03/24/2013  . Obstructive sleep apnea, ruled out 02/28/2013  . Urticaria due to food allergy 08/31/2012  . Thrombocytopenia ([place])     History obtained from: chart review and patient.  Donna Baldwin is a 79 y.o. female presenting for a follow up visit and skin testing.  She was last seen in September 2021.  At that time, we will plan to do COVID component testing, but her diastolic number on her blood pressure was consistently low.  We did call her cardiologist and arrange for her to be seen that day.  Per their note, her diastolic had already recovered by the time she went over there.  She has a history of multiple allergic reactions as well as multiple medication allergies.  I first saw her in July 2021.  We decided to schedule her for COVID vaccine component testing.  We did obtain a serum tryptase which was normal.  She also told me she had a history of chronic granulomatous disease, but it seems that this was just from one x-ray reading.  Her infectious history was unremarkable and we did not do a workup for that.    In the interim, she has maintained her blood pressure medication.  They are planning to do a prolonged blood pressure study on her, but she has not had that done yet.  She is comfortable with proceeding with the testing today.  Otherwise, there have been no changes to  her past medical history, surgical history, family history, or social history.    Review of Systems  Constitutional: Negative.  Negative for chills, fever, malaise/fatigue and weight loss.  HENT: Negative for congestion, ear discharge, ear pain and sinus pain.   Eyes: Negative for pain, discharge and redness.  Respiratory: Negative for cough, sputum production, shortness of breath and  wheezing.   Cardiovascular: Negative.  Negative for chest pain and palpitations.  Gastrointestinal: Negative for abdominal pain, constipation, diarrhea, heartburn, nausea and vomiting.  Skin: Negative.  Negative for itching and rash.  Neurological: Negative for dizziness and headaches.  Endo/Heme/Allergies: Negative for environmental allergies. Does not bruise/bleed easily.       Objective:   Blood pressure (!) 180/80, pulse 78, temperature 98.1 F (36.7 C), temperature source Temporal, resp. rate 18, height 5\' 4"  (1.626 m), SpO2 99 %. Body mass index is 19.05 kg/m.   Physical Exam: deferred since this was a skin testing appointment only    Diagnostic studies:   Allergy Studies:     COVID Vaccine Testing - 11/07/20 1146      Test Information   Consent Yes    Medications Miralax    Triamcinolone Lot # [id number]    Triamcinolone EXP DATE 12/30/21    Methylprednisolone Lot # [id number]    Methylprednisolone EXP DATE 02/27/21    Miralax Lot # [id number]    Miralax EXP DATE 04/29/22      Pre Test Vitals   BP 180/80    Pulse 78    Resp 18      SKIN PRICK TESTING - Arm #1   Location Right Arm    Select Select      HISTAMINE (1mg /mL) Skin Prick Arm #1   Histamine Time Testing Placed 0934    Histamine Wheal 2+      Control (negative - HSA) Skin Prick Arm #1   Control Time Testing Placed 0934    Control Wheal Negative      Triamcinolone (40mg /mL) Skin Prick Arm #1   Triamcinolone Time Testing Placed 0934    Triamcinolone Wheal Negative      Methylprednisolone (40mg /mL) Skin Prick Arm #1   Methylprednisolone Time Testing Placed 0934    Methylprednisolone Wheal Negative      Miralax (1:100 or 1.7 mg/mL) Skin Prick Arm #1   Miralax Time Testing Placed 0934    Miralax Wheal Negative      Miralax (1:10 or 17mg /mL) Skin Prick Arm #1   Miralax Time Testing Placed 0954    Miralax Wheal Negative      Miralax (1:1 or 170mg /mL) Skin Prick Arm #1   Miralax Time Testing  Placed 1015    Miralax Wheal Negative      INTRADERMAL TESTING - Arm #2   Location Left Arm    Select Select      Control (negative - HSA) Intradermal Arm #2   Control Time Testing Placed  0954    Control Wheal Negative      Triamcinolone (1:100) Intradermal Arm #2   Triamcinolone Time Testing Placed 0954    Triamcinolone Wheal Negative      Methylprednisolone (1:100) Intradermal Arm #2   Methylprednisolone Time Testing Placed  0954    Methylprednisolone Wheal Negative      Triamcinolone (1:10) Intradermal Arm #2   Triamcinolone Time Testing Placed 1015    Triamcinolone Wheal Negative      Methylprednisolone (1:10) Intradermal Arm #2   Methylprednisolone Time Testing Placed  1015  Methylprednisolone Wheal Negative      Triamcinolone (1:1) Intradermal Arm #2   Triamcinolone Time Testing Placed 1028    Triamcinolone Wheal Other   1+     Skin Prick/Intradermal Post Testing   Skin Prick/Intradermal Testing Total Pricks 13           Allergy testing results were read and interpreted by myself, documented by clinical staff.      Salvatore Marvel, MD  All[facility] and [place] [facility]

## 2020-11-14 NOTE — Progress Notes (Signed)
I talked to the patient about having to reschedule her appointment for November 19.  Unfortunately, there is not can be a physician in the [place] office that day and I think she is to high risk to do this challenge and vaccine with a nurse practitioner alone.  Therefore, we are changing her to Tuesday, November 30 at 8 AM.  I will be in the [place] office that day.  We will do the MiraLAX challenge followed by the [place] vaccine.  She is wondering if she could get her appointment with her cardiologist back on November 19.  She originally had to reschedule that appointment because we were trying to get her scheduled for our office to get the [place] vaccine.  I will reach out to her cardiologist to see if we can get her back on the cardiology schedule.  Salvatore Marvel, MD [facility]

## 2020-11-15 ENCOUNTER — Ambulatory Visit: Payer: Medicare HMO | Admitting: Cardiology

## 2020-11-17 ENCOUNTER — Encounter: Payer: BC Managed Care – PPO | Admitting: Family

## 2020-11-17 ENCOUNTER — Ambulatory Visit: Payer: Medicare HMO | Admitting: Cardiology

## 2020-11-20 ENCOUNTER — Ambulatory Visit: Payer: Medicare HMO | Admitting: Cardiology

## 2020-11-20 ENCOUNTER — Encounter: Payer: Self-pay | Admitting: Cardiology

## 2020-11-20 ENCOUNTER — Other Ambulatory Visit: Payer: Self-pay

## 2020-11-20 VITALS — BP 159/71 | HR 59 | Resp 16 | Ht 64.0 in | Wt 117.0 lb

## 2020-11-20 DIAGNOSIS — I10 Essential (primary) hypertension: Secondary | ICD-10-CM | POA: Diagnosis not present

## 2020-11-20 DIAGNOSIS — E782 Mixed hyperlipidemia: Secondary | ICD-10-CM

## 2020-11-20 DIAGNOSIS — I2510 Atherosclerotic heart disease of native coronary artery without angina pectoris: Secondary | ICD-10-CM

## 2020-11-20 MED ORDER — REPATHA 140 MG/ML ~~LOC~~ SOSY: 140.0000 mg | PREFILLED_SYRINGE | SUBCUTANEOUS | 3 refills | Status: DC

## 2020-11-20 NOTE — Progress Notes (Signed)
Patient is here for follow up visit.  Subjective:   @Patient  ID: Donna Baldwin, female    DOB: February 19, 1941, 79 y.o.   MRN: [id number]  Chief Complaint  Patient presents with  . Atherosclerosis of native coronary artery of native heart wi  . Follow-up     79 year old African-American female with labile hypertension, asymptomatic bradycardia, type II diabetes mellitus, chronic dysphagia, interstitial cystitis.  Given her list of multiple allergies, she is undergoing extensive allergy testing before her Covid vaccine.  She is not going to be eligible for [employer] vaccine.  She may be eligible for [employer] vaccine.    Blood pressure better controlled, but not optimal yet.    Current Outpatient Medications on File Prior to Visit  Medication Sig Dispense Refill  . amLODipine (NORVASC) 10 MG tablet Take 1 tablet (10 mg total) by mouth daily. (Patient taking differently: Take 5 mg by mouth daily. ) 30 tablet 3  . aspirin 81 MG tablet Take 81 mg by mouth daily as needed (for pain and pressure).     . BYSTOLIC 10 MG tablet TAKE 1 TABLET BY MOUTH EVERY DAY 90 tablet 0  . Calcium Carbonate Antacid (TUMS CHEWY BITES PO) Take 1 tablet by mouth daily as needed (acid reflux).    . cetirizine (ZYRTEC) 10 MG tablet Take 10 mg by mouth daily as needed for allergies.     [name] EPINEPHrine 0.3 mg/0.3 mL IJ SOAJ injection Inject 0.3 mg into the muscle once as needed (for anaphylaxis).   1  . ibuprofen (ADVIL) 600 MG tablet Take 1 tablet (600 mg total) by mouth every 6 (six) hours as needed. 30 tablet 0  . LORazepam (ATIVAN) 0.5 MG tablet Take 0.5 mg by mouth daily as needed for anxiety.    . Nutritional Supplements (ENSURE CLEAR PO) Take 237 mLs by mouth daily.     No current facility-administered medications on file prior to visit.    Cardiovascular studies:  EKG 09/14/2020: Sinus bradycardia 44 bpm  Nonspecific ST-T changes  CTA 12/08/2019: 1. Coronary artery calcium score 108  Agatston units. This places the patient in the 54th percentile for age and gender, suggesting intermediate risk for future cardiac events. 2.  Nonobstructive ostial left main calcified plaque.  Upper Abdomen: Scattered foci of hepatic dome arterial hyperenhancement are diminutive and likely small perfusion anomalies. Normal imaged portions of the spleen, stomach.  Musculoskeletal: Thoracic spondylosis.  IMPRESSION: 1.  No acute findings in the imaged extracardiac chest. 2.  Aortic Atherosclerosis (ICD10-I70.0). 3. Esophageal air fluid level suggests dysmotility or gastroesophageal reflux.   Event monitor 01/07/2019- 02/05/2019: Sinus bradycardia, lowest HR 32 bpm @ 3:55 AM Occasional PVC's correlate with symptoms of fatigue. Highest HR 109 bpm.  Exercise Treadmill Stress Test 10/23/2018:  Indication: Bradycardia The patient exercised on Bruce protocol for 06:17 min. Patient achieved 7.50 METS and reached HR 110 bpm, which is 76 % of maximum age-predicted HR. Stress test terminated due to fatigue.   Exercise capacity was below average for age. HR Response to Exercise: Sub-optimal secondary to limited exercise capacity BP Response to Exercise: Normal resting BP- appropriate response. Chest Pain: none. Arrhythmias: Occasional PVCs. Resting EKG demonstrates Normal sinus rhythm. ST Changes: With peak exercise there was no ST-T changes of ischemia.  Overall Impression: Submaximal stress test. Attenuated heart rate response without hypotension, lightheadedness Recommendations: Continue primary/secondary prevention. Consider alternate testing, if clinical suspicion for obstructive CAD is high.  Outside echocardiogram 07/06/2018: - Left ventricle: The  cavity size was normal. Wall thickness was  normal. Systolic function was normal. The estimated ejection  fraction was in the range of 55% to 60%. Wall motion was normal;  there were no regional wall motion abnormalities.  Doppler  parameters are consistent with abnormal left ventricular  relaxation (grade 1 diastolic dysfunction). - Left atrium: The atrium was mildly dilated. - Right atrium: The atrium was mildly dilated.   Impressions:   - Normal LV systolic function; mild diastolic dysfunction; mild  biatrial enlargement.  CT abdomen 06/2016: IMPRESSION: Nonspecific mild wall thickening of the distal stomach and duodenum can be seen with chronic gastritis or duodenitis. No evidence of bowel obstruction or perforation. Negative for abscess.  No other acute intra-abdominal or pelvic process by noncontrast CT. Aortoiliac atherosclerosis  Recent labs: 10/31/2020:  04/19/2020: Glucose 136, BUN/Cr 7/0.72. EGFR >60. Na/K 141/3.9.  H/H 10.3/35. MCV 76. Platelets 108  11/2019: Chol 244, TG 77, HDL 94, LDL 141  2019: TSH 31. normal   Review of Systems  Cardiovascular: Negative for chest pain, dyspnea on exertion, leg swelling, palpitations and syncope.       Objective:     Vitals:   11/20/20 1326  BP: (!) 159/71  Pulse: (!) 59  Resp: 16  SpO2: 100%     Physical Exam Vitals and nursing note reviewed.  Constitutional:      General: She is not in acute distress.    Appearance: She is underweight.  Neck:     Vascular: No JVD.  Cardiovascular:     Rate and Rhythm: Normal rate and regular rhythm.     Heart sounds: Normal heart sounds. No murmur heard.   Pulmonary:     Effort: Pulmonary effort is normal.     Breath sounds: Normal breath sounds. No wheezing or rales.         Assessment & Recommendations:   79 year old African-American female with labile hypertension, hyperlipidemia, nonobstructive CAD, type II diabetes mellitus, chronic dysphagia, interstitial cystitis,   Essential hypertension: Labile hypertension.  Arranged for remote patient monitoring through pur pharmacist Manuela Schwartz. Continue bystolic 10, amlodipine 5 for now. Next step would be to increase dose of  amlodipine to 10 mg.  Sinus bradycardia: Secondary to ongoing Bystolic use.  No indication for any further work-up.  Hyperlipidemia: Statin intolerant. Given mild nonobstructive CAD, recommend PCSK9 inhibitor. Resent prescription of Repatha  CAD without angina: Recommend medical management with Aspirin 81 mg, management of hypertension and hyperlipidemia.    Nigel Mormon, MD [facility] Pager: [phone] Office: [phone] If no answer Cell [phone]

## 2020-11-22 ENCOUNTER — Telehealth: Payer: Self-pay | Admitting: Gastroenterology

## 2020-11-22 NOTE — Telephone Encounter (Signed)
Ok, please schedule next available appointment for office visit. Thanks

## 2020-11-22 NOTE — Telephone Encounter (Signed)
Hey Dr Silverio Decamp,  Pt has seen Dr Michail Sermon GI since 02/2020 but pt is being referred by Dr Kieth Brightly to [place] for Achalasia, pt's records are in epic for review, please advise on scheduling.

## 2020-11-28 ENCOUNTER — Encounter: Payer: Self-pay | Admitting: Allergy & Immunology

## 2020-11-28 ENCOUNTER — Ambulatory Visit: Payer: BC Managed Care – PPO

## 2020-11-28 ENCOUNTER — Other Ambulatory Visit: Payer: Self-pay

## 2020-11-28 ENCOUNTER — Ambulatory Visit (INDEPENDENT_AMBULATORY_CARE_PROVIDER_SITE_OTHER): Payer: Medicare HMO | Admitting: Allergy & Immunology

## 2020-11-28 VITALS — BP 176/84 | HR 63 | Temp 98.7°F | Resp 18

## 2020-11-28 DIAGNOSIS — Z23 Encounter for immunization: Secondary | ICD-10-CM

## 2020-11-28 DIAGNOSIS — T7840XD Allergy, unspecified, subsequent encounter: Secondary | ICD-10-CM | POA: Diagnosis not present

## 2020-11-28 DIAGNOSIS — I10 Essential (primary) hypertension: Secondary | ICD-10-CM | POA: Diagnosis not present

## 2020-11-28 DIAGNOSIS — Z889 Allergy status to unspecified drugs, medicaments and biological substances status: Secondary | ICD-10-CM

## 2020-11-28 NOTE — Progress Notes (Signed)
FOLLOW UP  Date of Service/Encounter:  11/28/20   Assessment/Plan:   Allergic reaction- with concern for vaccine allergy (testing positive to triamcinolone on the most concentrated intradermal injection) - tolerated her Miralax challenge today and her first dose of Pfizer  Multiple antibiotic allergies  P/SAR - controlled with avoidance measures for the most part  Food sensitizations - without a clear allergic history  Complicated past medical history, including achalasia   Donna Baldwin did well with the MiraLAX challenge in the subsequent divided dosing of the [place] vaccine.  She did have an episode where she felt very tired and was leaning over, however, her vitals remained normal.  We did give her cetirizine 20 mg and she recovered in a couple of minutes.  I do not think this was an anaphylactic episode by any means, but more likely related to anxiety.  I continue to reassure her that her vitals were completely normal during the entire episode.  Her systolic blood pressure was elevated, but she had not taken her morning blood pressure dose.  She is going to call us with any updates over the next few hours and we will see her again in 3 weeks for her second vaccination.  She was very thankful that we are able to do this today.    Subjective:   Donna Baldwin is a 79 y.o. female presenting today for follow up of  Chief Complaint  Patient presents with  . Food/Drug Challenge    Miralax    Donna Baldwin has a history of the following: Patient Active Problem List   Diagnosis Date Noted  . Achalasia 04/18/2020  . Abnormal CT of liver 12/20/2019  . Coronary artery disease involving native coronary artery of native heart without angina pectoris 12/20/2019  . Palpitation 02/08/2019  . Nonintractable headache   . TIA (transient ischemic attack) 07/06/2018  . Hypertensive urgency 07/06/2018  . History of diabetes mellitus 04/01/2017  . Transient retinal  artery occlusion of both eyes 07/07/2014  . Hereditary and idiopathic peripheral neuropathy 07/07/2014  . Aortic atherosclerosis ([place]) 12/03/2013  . Mixed hyperlipidemia 12/03/2013  . Essential hypertension 12/03/2013  . GERD (gastroesophageal reflux disease) 03/24/2013  . Obstructive sleep apnea, ruled out 02/28/2013  . Urticaria due to food allergy 08/31/2012  . Thrombocytopenia ([place])     History obtained from: chart review and patient and patient's daughter.  Donna Baldwin is a 79 y.o. female presenting for a drug challenge.  She was last seen in November 2021.  At that time, she underwent Covid vaccine committee testing was positive to triamcinolone on the most concentrated intradermal injection.  Therefore, recommended not getting the [employer].  We did not do the MiraLAX challenge to fully rule out an polyethylene glycol allergy because she had had cream of wheat that morning and she was worried about having some bowel issues.  Therefore, we rescheduled her for the MiraLAX challenge and vaccine administration.  Since last visit, she has done well.  She did not take her blood pressure medicine today and has not eaten this morning.  She was told to not do any of these things since she had a MiraLAX challenge.  Otherwise, there have been no changes to her past medical history, surgical history, family history, or social history.    Review of Systems  Constitutional: Negative.  Negative for chills, fever, malaise/fatigue and weight loss.  HENT: Negative for congestion, ear discharge, ear pain, sinus pain and sore throat.   Eyes: Negative  for pain, discharge and redness.  Respiratory: Negative for cough, sputum production, shortness of breath and wheezing.   Cardiovascular: Negative.  Negative for chest pain and palpitations.  Gastrointestinal: Negative for abdominal pain, constipation, diarrhea, heartburn, nausea and vomiting.  Skin: Negative.  Negative for itching and rash.   Neurological: Negative for dizziness and headaches.  Endo/Heme/Allergies: Negative for environmental allergies. Does not bruise/bleed easily.       Objective:   Blood pressure (!) 176/84, pulse 63, temperature 98.7 F (37.1 C), temperature source Temporal, resp. rate 18, SpO2 99 %. There is no height or weight on file to calculate BMI.   Physical Exam: deferred since this was an oral challenge and drug challenge visit only     Diagnostic studies:   Donna Baldwin underwent a MiraLAX challenge and tolerated this well.  Her vitals remained within normal limits and she reported no symptoms.  She then received 0.1 mL of the Pfizer vaccine.  We watched her for 30 minutes and she received 0.2 mL of the Pfizer vaccine.  We then monitored her for 1 hour.  She did have an episode of fatigue, but her vitals were normal.  We gave her cetirizine 20 mg and she was back to her baseline in less than 5 minutes.   Oral Challenge - 11/28/20 1200    Challenge [employer] vaccine    Lot #  if Applicable [id number]    Food/Drug provided by Clinic    Comments Per Dr. Novella Rob okay to give    BP 176/84    Pulse 63    Respirations 18    Lungs 99%    Skin Clear    Mouth Clear    Time 0937    Dose 0.1 mL    Lungs clear    Skin clear    Mouth clear    Time 1015    Dose 0.2 mL    BP 172/82    Pulse 71    Respirations 18    Lungs 98    Skin Clear    Mouth Clear    Comments SOB/Fatigue    Time 1100    Dose observation    BP 164/70    Pulse 50    Respirations 18    Lungs 99    Skin clear    Mouth clear    Comments patient was stable to leave per Dr. Ernst Bowler.           COVID Vaccine Testing - 11/28/20 0813      Test Information   Consent Yes      Pre Test Vitals   BP 176/84    Pulse 63    Resp 18      ORAL CHALLENGE TESTING   Select Select      Pre Challenge Vitals   BP 176/84    Pulse 63    Resp 18      Miralax 170mg /mL Suspension Oral Challenge 0.3 mL   Miralax 0.3  mL Time Given 0813      Miralax 170mg /mL Suspension Oral Challenge 3 mL   Miralax 3 mL Time Given 0830      Miralax 170mg /mL Suspension Oral Challenge 15 mL   Miralax 15 mL Time Given 0848      Post Test Vitals   BP 122/86    Pulse 48    Resp 18               Salvatore Marvel, MD  [facility]

## 2020-11-29 ENCOUNTER — Ambulatory Visit: Payer: Medicare HMO | Admitting: Cardiology

## 2020-12-12 NOTE — Progress Notes (Signed)
Left voicemail on mobile phone to call back to schedule 2nd Pfizer vaccine.

## 2020-12-19 ENCOUNTER — Ambulatory Visit (INDEPENDENT_AMBULATORY_CARE_PROVIDER_SITE_OTHER): Payer: Medicare HMO | Admitting: Allergy & Immunology

## 2020-12-19 ENCOUNTER — Encounter: Payer: Self-pay | Admitting: Allergy & Immunology

## 2020-12-19 ENCOUNTER — Other Ambulatory Visit: Payer: Self-pay

## 2020-12-19 ENCOUNTER — Ambulatory Visit: Payer: BC Managed Care – PPO

## 2020-12-19 VITALS — BP 146/70 | HR 72 | Temp 98.4°F

## 2020-12-19 DIAGNOSIS — Z23 Encounter for immunization: Secondary | ICD-10-CM

## 2020-12-19 NOTE — Progress Notes (Signed)
FOLLOW UP  Date of Service/Encounter:  12/19/20   Assessment:   Allergic reaction- with concern for vaccine allergy(testing positive to triamcinolone on the most concentrated intradermal injection) - tolerated her Miralax challenge today and her first dose of Pfizer  Multiple antibiotic allergies  P/SAR - controlled with avoidance measures for the most part  Food sensitizations - without a clear allergic history  Complicated past medical history, including achalasia  Plan/Recommendations:   1. Concern for COVID vaccine reaction - You tolerated your COVID vaccine today. - Call us with any problems or concerns. - Come see [place] in six months for the booster.   2. Return in about 6 months (around 06/19/2021).    Subjective:   Donna Baldwin is a 79 y.o. female presenting today for follow up of No chief complaint on file.   Donna Baldwin has a history of the following: Patient Active Problem List   Diagnosis Date Noted   Achalasia 04/18/2020   Abnormal CT of liver 12/20/2019   Coronary artery disease involving native coronary artery of native heart without angina pectoris 12/20/2019   Palpitation 02/08/2019   Nonintractable headache    TIA (transient ischemic attack) 07/06/2018   Hypertensive urgency 07/06/2018   History of diabetes mellitus 04/01/2017   Transient retinal artery occlusion of both eyes 07/07/2014   Hereditary and idiopathic peripheral neuropathy 07/07/2014   Aortic atherosclerosis ([place]) 12/03/2013   Mixed hyperlipidemia 12/03/2013   Essential hypertension 12/03/2013   GERD (gastroesophageal reflux disease) 03/24/2013   Obstructive sleep apnea, ruled out 02/28/2013   Urticaria due to food allergy 08/31/2012   Thrombocytopenia ([place])     History obtained from: chart review and patient.  Donna Baldwin is a 79 y.o. female presenting for a follow up visit.  She is here for her second Pfizer vaccine.  She did well with the  last one 3 weeks ago.  We did a graded challenge.  She reports that she did have some myalgias after the last 1, but otherwise did not very well.  She is feeling good today without fevers, rashes, or other concerns.  She is very thankful that she is able to get the vaccine here.  Otherwise, there have been no changes to her past medical history, surgical history, family history, or social history.    Review of Systems  Constitutional: Negative.  Negative for fever, malaise/fatigue and weight loss.  HENT: Negative.  Negative for congestion, ear discharge and ear pain.   Eyes: Negative for pain, discharge and redness.  Respiratory: Negative for cough, sputum production, shortness of breath and wheezing.   Cardiovascular: Negative.  Negative for chest pain and palpitations.  Gastrointestinal: Negative for abdominal pain and heartburn.  Skin: Negative.  Negative for itching and rash.  Neurological: Negative for dizziness and headaches.  Endo/Heme/Allergies: Negative for environmental allergies. Does not bruise/bleed easily.       Objective:   Blood pressure (!) 146/70, pulse 72, temperature 98.4 F (36.9 C), temperature source Tympanic, SpO2 100 %. There is no height or weight on file to calculate BMI.   Physical Exam:  Physical Exam Constitutional:      Appearance: She is well-developed.  HENT:     Head: Normocephalic and atraumatic.     Right Ear: Tympanic membrane, ear canal and external ear normal.     Left Ear: Tympanic membrane and ear canal normal.     Nose: No nasal deformity, septal deviation, mucosal edema, rhinorrhea or epistaxis.     Right Sinus:  No maxillary sinus tenderness or frontal sinus tenderness.     Left Sinus: No maxillary sinus tenderness or frontal sinus tenderness.     Mouth/Throat:     Mouth: Oropharynx is clear and moist. Mucous membranes are not pale and not dry.     Pharynx: Uvula midline.  Eyes:     General:        Right eye: No discharge.         Left eye: No discharge.     Extraocular Movements: EOM normal.     Conjunctiva/sclera: Conjunctivae normal.     Right eye: Right conjunctiva is not injected. No chemosis.    Left eye: Left conjunctiva is not injected. No chemosis.    Pupils: Pupils are equal, round, and reactive to light.  Cardiovascular:     Rate and Rhythm: Normal rate and regular rhythm.     Heart sounds: Normal heart sounds.  Pulmonary:     Effort: Pulmonary effort is normal. No tachypnea, accessory muscle usage or respiratory distress.     Breath sounds: Normal breath sounds. No wheezing, rhonchi or rales.  Chest:     Chest wall: No tenderness.  Lymphadenopathy:     Cervical: No cervical adenopathy.  Skin:    Coloration: Skin is not pale.     Findings: No abrasion, erythema, petechiae or rash. Rash is not papular, urticarial or vesicular.  Neurological:     Mental Status: She is alert.  Psychiatric:        Mood and Affect: Mood and affect normal.      Diagnostic studies:    Patient received 0.1 mL of the Pfizer vaccine and was monitored for 30 minutes. She then received 0.2 mL of the Pfizer vaccine and was monitored for 1 hour. She tolerated this procedure well.       Salvatore Marvel, MD  [facility]

## 2020-12-19 NOTE — Patient Instructions (Addendum)
1. Concern for COVID vaccine reaction - You tolerated your COVID vaccine today. - Call us with any problems or concerns. - Come see [place] in six months for the booster.   2. Return in about 6 months (around 06/19/2021).    Please inform us of any Emergency Department visits, hospitalizations, or changes in symptoms. Call us before going to the ED for breathing or allergy symptoms since we might be able to fit you in for a sick visit. Feel free to contact us anytime with any questions, problems, or concerns.  It was a pleasure to see you and your family again today!  Websites that have reliable patient information: 1. American Academy of Asthma, Allergy, and Immunology: www.aaaai.org 2. Food Allergy Research and Education (FARE): foodallergy.org 3. Mothers of Asthmatics: http://www.asthmacommunitynetwork.org 4. American College of Allergy, Asthma, and Immunology: www.acaai.org   COVID-19 Vaccine Information can be found at: ShippingScam.co.uk For questions related to vaccine distribution or appointments, please email vaccine@[email or web address] .[email or web address] or call [phone].     Like [place] on [place] and Instagram for our latest updates!       Make sure you are registered to vote! If you have moved or changed any of your contact information, you will need to get this updated before voting!  In some cases, you MAY be able to register to vote online: [email or web address]

## 2020-12-21 ENCOUNTER — Encounter: Payer: Self-pay | Admitting: Allergy & Immunology

## 2020-12-29 DIAGNOSIS — I10 Essential (primary) hypertension: Secondary | ICD-10-CM | POA: Diagnosis not present

## 2021-02-20 ENCOUNTER — Other Ambulatory Visit: Payer: Self-pay | Admitting: Cardiology

## 2021-03-24 DIAGNOSIS — R202 Paresthesia of skin: Secondary | ICD-10-CM | POA: Diagnosis not present

## 2021-04-03 DIAGNOSIS — I10 Essential (primary) hypertension: Secondary | ICD-10-CM | POA: Diagnosis not present

## 2021-04-03 DIAGNOSIS — R21 Rash and other nonspecific skin eruption: Secondary | ICD-10-CM | POA: Diagnosis not present

## 2021-04-09 DIAGNOSIS — G609 Hereditary and idiopathic neuropathy, unspecified: Secondary | ICD-10-CM | POA: Diagnosis not present

## 2021-04-09 DIAGNOSIS — K220 Achalasia of cardia: Secondary | ICD-10-CM | POA: Diagnosis not present

## 2021-04-09 DIAGNOSIS — I700 Atherosclerosis of aorta: Secondary | ICD-10-CM | POA: Diagnosis not present

## 2021-04-09 DIAGNOSIS — E559 Vitamin D deficiency, unspecified: Secondary | ICD-10-CM | POA: Diagnosis not present

## 2021-04-09 DIAGNOSIS — E1169 Type 2 diabetes mellitus with other specified complication: Secondary | ICD-10-CM | POA: Diagnosis not present

## 2021-04-09 DIAGNOSIS — D696 Thrombocytopenia, unspecified: Secondary | ICD-10-CM | POA: Diagnosis not present

## 2021-04-09 DIAGNOSIS — E785 Hyperlipidemia, unspecified: Secondary | ICD-10-CM | POA: Diagnosis not present

## 2021-04-09 DIAGNOSIS — I10 Essential (primary) hypertension: Secondary | ICD-10-CM | POA: Diagnosis not present

## 2021-04-09 DIAGNOSIS — Z1159 Encounter for screening for other viral diseases: Secondary | ICD-10-CM | POA: Diagnosis not present

## 2021-04-10 ENCOUNTER — Telehealth: Payer: Self-pay | Admitting: Physician Assistant

## 2021-04-10 ENCOUNTER — Other Ambulatory Visit: Payer: Self-pay | Admitting: Physician Assistant

## 2021-04-10 DIAGNOSIS — D696 Thrombocytopenia, unspecified: Secondary | ICD-10-CM

## 2021-04-10 NOTE — Progress Notes (Signed)
[place] [phone]   [phone]  CONSULT NOTE  REFERRING PHYSICIAN: Dr. Dema Severin  REASON FOR CONSULTATION:  Thrombocytopenia   HPI TENISHA FLEECE is a 80 y.o. female with a past medical history significant for aortic atherosclerosis, hypertension, TIA, hypertensive urgency, sleep apnea, GERD, achalasia, hereditary and idiopathic peripheral neuropathy, and hyperlipidemia is referred to the clinic for evaluation of thrombocytopenia.   The patient states she was referred her after having an incident where her one of her fingers turned white and the tip of her finger turned cyanotic. She states she was evaluated by rheumatology in the past, particularlly for peripheral neuropathy of unclear etiology. She also states she had one incident where her entire hand "turned black" several years ago. Due to this and her lab abnormalities, she states it was suggested for her to see hematology.   The patient recently had a follow-up appointment with her PCP on 04/09/2021.  The patient was having a routine follow-up and evaluation.  She had blood work performed on 04/03/2021 which showed a CBC with a total white blood cell count at 4.2, hemoglobin slightly low at 11.4 and MCV low at 70.8.  Of note, the patient does have a history of iron deficiency anemia on and off since she was child bearing age. She has not taken iron supplements for "years". The patient's platelet count was slightly low at 137K.  She was subsequently referred to the clinic today for evaluation regarding these findings and her reported concerns with her finger.  Per chart review, the oldest records available to me are from 2009 in which the patient also had evidence of thrombocytopenia and anemia with a platelet count at 107K and MCV low at 71.9, and hemoglobin slightly low at 11.2. The patient believes this has been going on since child bearing years. The lowest platelet count available to me is  102k. Her platelet count has always been >100k per chart review to our knowledge. Her anemia also has been mild as well. The lowest Hbg seen was 9.7, but generally, her Hbg is >11.   The patient's main concern lately has been due to achalasia which she is seeing gastroenterology. She is supposed to see a specialist in the near future at [facility]. Due to this, she has rapid weight loss. She is eating pureed food.   Regarding the anemia, the patient has never required any blood transfusion or an iron infusion. The patient's last colonoscopy was last last year, although the records are not available to me. She states that there was no malignancy. She also sees GI for her achalasia.  The atient is not presently on a blood thinner except for 81 mg aspirin.  The patient denies any NSAID use.  She reports small bruising on her extremities with phlebotomy, but otherwise denies large ecchymosis. The patient denies any other vitamin deficiencies between besides vitamin D and a history of vitamin B12.  She denies any abnormal bleeding including epistaxis, vaginal bleeding, hemoptysis, or hematemesis. She has occasional gingival bleeding. She also notes she has internal hemorrhoids. She states when she was using a suppository recently, she noted a small amount of bright red blood. She has had two children and denies any bleeding complications with child birth. She also has some surgeries with rotator cuff repair and tonsillectomy without any bleeding complications. She reports fatigue/low energy.  Denies any lymphadenopathy. Denies any palpitations, chest pain, shortness of breath. She more so describes decreased exercise tolerance.  The  patient denies any over-the-counter herbal supplement use. The patient denies any new medication use. She is supposed to start [place] soon for her HLD. The patient denies any particular dietary habits such as being a vegan or vegetarian and eats meat "when she can" due to the achalasia. She  also states she tries to eat vegtables.  She denies any history of bariatric surgery.  The patient denies frequent infections.  The patient denies any immunosuppressive drugs.  The patient denies any recent infections and denies any nasal congestion, sore throat, skin infections, dysuria, or diarrhea.  The patient denies any history of any abnormal viral infections such as hepatitis, HIV, or mono.  The patient denies any history of liver disease.  The patient denies any history of known autoimmune disorders but she does report history of a positive ANA in the past and was seen by rheumatology in the past.   Her mother also had anemia and had to "take something" for it. Her mother passed away at the age of 29 due to tuberculosis. She does not have a family history of sickle cell anemia or thalassemia. She does not have siblings. Her father had metastatic cancer of unknown origin. She denies known history of colorectal cancer. Her cousin had liver cancer. She had another cousin with esophageal cancer. She also had another cousin with scleroderma.   She used to work in [employer]. She is married and has two children. She denies drug, alcohol, or tobacco use.     HPI  Past Medical History:  Diagnosis Date  . Acid reflux   . Anemia   . Anxiety   . Atherosclerosis   . Back pain   . Coronary artery disease   . GERD (gastroesophageal reflux disease)   . HTN (hypertension) 12/03/2013   CT scan-no pheochromocytoma  . Hyperlipidemia   . Hypertension   . Hypertensive heart disease without heart failure   . Thrombocytopenia ([place])   . Vitamin D deficiency     Past Surgical History:  Procedure Laterality Date  . ESOPHAGEAL MANOMETRY N/A 03/15/2020   Procedure: ESOPHAGEAL MANOMETRY (EM);  Surgeon: Wilford Corner, MD;  Location: [facility] END[facility];  Service: Endoscopy;  Laterality: N/A;  . EYE SURGERY    . HELLER MYOTOMY N/A 04/18/2020   Procedure: LAPAROSCOPIC HELLER MYOTOMY WITH UPPER ENDOSCOPY;   Surgeon: Kieth Brightly, Arta Bruce, MD;  Location: [facility] ORS;  Service: General;  Laterality: N/A;  . [place] IMPEDANCE STUDY N/A 03/15/2020   Procedure: [place] IMPEDANCE STUDY;  Surgeon: Wilford Corner, MD;  Location: [facility] ENDOS[facility];  Service: Endoscopy;  Laterality: N/A;  . SHOULDER SURGERY Bilateral   . TONSILLECTOMY      Family History  Problem Relation Age of Onset  . Cancer Father   . Allergic rhinitis Neg Hx   . Angioedema Neg Hx   . Asthma Neg Hx   . Atopy Neg Hx   . Eczema Neg Hx   . Immunodeficiency Neg Hx   . Urticaria Neg Hx     Social History Social History   Tobacco Use  . Smoking status: Never Smoker  . Smokeless tobacco: Never Used  Vaping Use  . Vaping Use: Never used  Substance Use Topics  . Alcohol use: No    Alcohol/week: 0.0 standard drinks  . Drug use: No    Allergies  Allergen Reactions  . Acetaminophen Other (See Comments)    Tremor, anxiety  . Beef Allergy Anaphylaxis  . Beef-Derived Products Anaphylaxis  . Codeine Hives and Anxiety  .  Hydralazine Hives and Shortness Of Breath  . Iodine Anaphylaxis  . Ivp Dye [Iodinated Diagnostic Agents] Anaphylaxis and Other (See Comments)  . Latex Rash  . Morphine And Related Other (See Comments)    Tremors, increased heart rate, excitement, confusion per pt  . Penicillins Hives    Has patient had a PCN reaction causing immediate rash, facial/tongue/throat swelling, SOB or lightheadedness with hypotension: Yes Has patient had a PCN reaction causing severe rash involving mucus membranes or skin necrosis: Yes Has patient had a PCN reaction that required hospitalization: Yes Has patient had a PCN reaction occurring within the last 10 years: No If all of the above answers are "NO", then may proceed with Cephalosporin use.   . Sertraline Other (See Comments)    Numbness in mouth and hyperactivity  . Shellfish Allergy Hives  . Solu-Medrol [Methylprednisolone] Other (See Comments)    Numbness and tingling in mouth  .  Sulfa Antibiotics Hives and Rash  . Barbiturates Other (See Comments)    Excitement   . Bee Pollen Hives  . Bee Venom Hives  . Diovan [Valsartan] Nausea Only  . Gabapentin Other (See Comments)    Visual disturbance  . Livalo [Pitavastatin] Swelling and Other (See Comments)    Facial swelling  . Other Palpitations, Other (See Comments) and Rash    NARCOTIC ANALGESICS-TREMORS, INCREASED HEART RATE Hmg-Coa Reductase Inhibitors - intolerance unknown   . Oxycodone Swelling, Rash and Cough  . Promethazine-Dm Other (See Comments)    "Felt funny"  . Tape Rash and Other (See Comments)    Red blotches   . Wellness Essentials Blood Sugr [Nutritional Supplements] Other (See Comments)    Nuts, strawberries, bicarbonate of soda,  . Albuterol Other (See Comments)    Caused wheezing  . Ciprofloxacin Other (See Comments)  . Crestor [Rosuvastatin] Other (See Comments)  . Famotidine Other (See Comments)    Unknown reaction  . Pred Forte [Prednisolone Acetate] Other (See Comments)    Patient is seeing odd color and images while using this  . Welchol [Colesevelam]     Other reaction(s): myalgias  . Celebrex [Celecoxib] Swelling and Other (See Comments)    Swelling and numbness (mouth)  . Lanolin Itching  . Mobic [Meloxicam] Swelling and Other (See Comments)    Swelling and numbness in mouth  . Nickel Rash  . Nitrates, Organic Rash and Other (See Comments)    Chest tightness also  . Petrolatum Rash and Other (See Comments)  . Soy Allergy Rash  . Strawberry Extract Rash    Current Outpatient Medications  Medication Sig Dispense Refill  . amLODipine (NORVASC) 10 MG tablet Take 1 tablet (10 mg total) by mouth daily. (Patient taking differently: Take 5 mg by mouth daily.) 30 tablet 3  . aspirin 81 MG tablet Take 81 mg by mouth daily as needed (for pain and pressure).     . BYSTOLIC 10 MG tablet TAKE 1 TABLET BY MOUTH EVERY DAY 90 tablet 0  . Calcium Carbonate Antacid (TUMS CHEWY BITES PO)  Take 1 tablet by mouth daily as needed (acid reflux).    . cetirizine (ZYRTEC) 10 MG tablet Take 10 mg by mouth daily as needed for allergies.     . Homeopathic Products ([place])     . ibuprofen (ADVIL) 600 MG tablet Take 1 tablet (600 mg total) by mouth every 6 (six) hours as needed. 30 tablet 0  . LORazepam (ATIVAN) 0.5 MG tablet Take 0.5 mg by mouth daily as  needed for anxiety.    . protein supplement shake (PREMIER PROTEIN) LIQD Take 2 oz by mouth daily as needed. Pt states she has maybe one full container a day.    . Pyridoxine HCl (VITAMIN B6) 50 MG TABS 1 tablet    . EPINEPHrine 0.3 mg/0.3 mL IJ SOAJ injection Inject 0.3 mg into the muscle once as needed (for anaphylaxis).  (Patient not taking: No sig reported)  1  . Evolocumab (REPATHA) 140 MG/ML SOSY Inject 140 mg into the skin every 14 (fourteen) days. (Patient not taking: Reported on 04/11/2021) 6 mL 3   No current facility-administered medications for this visit.    REVIEW OF SYSTEMS:   Review of Systems  Constitutional: Positive for fatigue and weight loss. . Negative for chills and fever. HENT: Positive for dysphagia.  Negative for mouth sores, nosebleeds, and sore throat. Eyes: Negative for eye problems and icterus.  Respiratory: Negative for cough, hemoptysis, shortness of breath and wheezing.   Cardiovascular: Negative for chest pain and leg swelling.  Gastrointestinal: Negative for abdominal pain, constipation, diarrhea, nausea and vomiting.  Genitourinary: Negative for bladder incontinence, difficulty urinating, dysuria, frequency and hematuria.   Musculoskeletal: Negative for back pain, gait problem, neck pain and neck stiffness.  Skin: Negative for itching and rash.  Neurological: Negative for dizziness, extremity weakness, gait problem, headaches, light-headedness and seizures.  Hematological: Negative for adenopathy. Mild occasional gingival bleeding. Patient reports bruising occasionally after  phlebotomy.  Psychiatric/Behavioral: Negative for confusion, depression and sleep disturbance. The patient is not nervous/anxious.     PHYSICAL EXAMINATION:  Blood pressure (!) 153/61, pulse (!) 43, temperature (!) 97.4 F (36.3 C), temperature source Tympanic, resp. rate 13, height 5\' 3"  (1.6 m), weight 116 lb 14.4 oz (53 kg), SpO2 100 %.  ECOG PERFORMANCE STATUS: 0 - Asymptomatic  Physical Exam  Constitutional: Oriented to person, place, and time and thin appearing female and in no distress.  HENT:  Head: Normocephalic and atraumatic.  Mouth/Throat: Oropharynx is clear and moist. No oropharyngeal exudate.  Eyes: Conjunctivae are normal. Right eye exhibits no discharge. Left eye exhibits no discharge. No scleral icterus.  Neck: Normal range of motion. Neck supple.  Cardiovascular: Normal rate, regular rhythm, normal heart sounds and intact distal pulses.   Pulmonary/Chest: Effort normal and breath sounds normal. No respiratory distress. No wheezes. No rales.  Abdominal: Soft. Bowel sounds are normal. Exhibits no distension and no mass. There is no tenderness.  Musculoskeletal: Normal range of motion. Exhibits no edema.  Lymphadenopathy:    No cervical adenopathy.  Neurological: Alert and oriented to person, place, and time. Exhibits muscle wasting. Gait normal. Coordination normal.  Skin: Skin is warm and dry. No rash noted. Not diaphoretic. No erythema. No pallor. No major bruising at this time.  Psychiatric: Mood, memory and judgment normal.  Vitals reviewed.  LABORATORY DATA: Lab Results  Component Value Date   WBC 5.8 04/11/2021   HGB 11.3 (L) 04/11/2021   HCT 37.5 04/11/2021   MCV 74.4 (L) 04/11/2021   PLT 155 04/11/2021      Chemistry      Component Value Date/Time   NA 144 04/11/2021 1300   NA 144 11/24/2019 0851   K 4.1 04/11/2021 1300   CL 105 04/11/2021 1300   CO2 28 04/11/2021 1300   BUN 18 04/11/2021 1300   BUN 13 11/24/2019 0851   CREATININE 0.82  04/11/2021 1300   CREATININE 0.87 04/03/2017 1059      Component Value Date/Time  CALCIUM 9.8 04/11/2021 1300   ALKPHOS 95 04/11/2021 1300   AST 21 04/11/2021 1300   ALT 12 04/11/2021 1300   BILITOT 0.4 04/11/2021 1300       RADIOGRAPHIC STUDIES: No results found.  ASSESSMENT: This is a very pleasant 80 year old African American female referred to the clinic for evaluation of thrombocytopenia.  The patient also has a history of iron deficiency anemia.   PLAN: The patient had several lab studies performed including a CBC, CMP, iron studies, ferritin, Y78, folic acid, SPEP with immunofixation, RF hepatitis panel, and HIV testing.  The patient CBC from today shows an improved platelet count on the low end of normal at 155k and slightly low Hbg of 11.3.  The patient's iron studies, G95, and folic acid are within normal limits.  The patient's LDH is lightly elevated at 214.  The patient's SPEP with immunofixation, vitamin B12, rheumatoid factor, hepatitis panel, and HIV testing is still pending at this time.  Dr. Julien Nordmann recommends waiting for the remainder of the lab studies before making further workup plans.  I will personally call the patient once I have all of the lab results.   Given that the patient's thrombocytopenia has improved at that she has mild anemia which has been stable for several years. He recommends a 3 month follow up visit. She has been asymptomatic from her thrombocytopenia in the past and denies surgical/bleeding complications. Her anemia is also mild.   Dr. Julien Nordmann would recommend that she take iron supplements though given the microcytic anemia. Due to her achalasia, discussed she can take liquid iron supplements if easier to swallow.   She is supposed to be following up with gastroenterology in the near future but does not have appointment scheduled at this time. We will defer a decision to them regarding repeat colonoscopy. Although, the patient states she had a  colonoscopy last year. She also notes some internal hemorrhoids which occasionally produce bright red blood.   Regarding the picture that the patient showed [place] with her finger turning white and some darkening at the tip of her finger, this looks suspicious for Raynaud's. The patient states she was evaluated by rheumatology in the past. We are suspicious that the phenomenon she is experience is more so a rheumatological etiology as opposed to a hematologic etiology. Her anemia and thrombocytopenia has always been mild and >100k so it would be unlikely to have spontaneous bleeding. Of course, if she ever has bleeding symptoms or bruising, she can always call us sooner and we can arrange for a same day repeat CBC to assess her platelet count at that time.    The patient voices understanding of current disease status and treatment options and is in agreement with the current care plan.  All questions were answered. The patient knows to call the clinic with any problems, questions or concerns. We can certainly see the patient much sooner if necessary.  Thank you so much for allowing me to participate in the care of [name]. I will continue to follow up the patient with you and assist in her care.  Disclaimer: This note was dictated with voice recognition software. Similar sounding words can inadvertently be transcribed and may not be corrected upon review.   Keziah Avis L [place]heton Viramontes April 11, 2021, 3:45 PM   ADDENDUM: Hematology/Oncology Attending: I had a face-to-face encounter with the patient today.  I reviewed her records, labs and recommended her care plan.  This is a very pleasant 80 years old  African-American female with multiple medical problems including hypertension, TIA, GERD, achalasia, peripheral neuropathy as well as dyslipidemia.  The patient was seen recently by her primary care physician for routine evaluation and CBC at that time showed low hemoglobin of 11.4 with MCV  of 70.8.  She was also found to have low platelets count of 1 37,000.  The patient mentions that he has a history of iron deficiency anemia for many years since her reproductive age.  She has been taking oral iron tablet on and off but not for the last several years.  She had evidence of thrombocytopenia and anemia back to 2009. She is scheduled to see gastroenterology for evaluation and consideration of colonoscopy soon. We order several studies and repeat CBC today showed mild anemia with hemoglobin of 11.3 and normal hematocrit of 37.5 with MCV of 74.4.  The patient has normal platelets count of 155,000. The patient denied having any history of sickle cell or thalassemia in her family. This is likely iron deficiency anemia from chronic blood loss. I recommended for the patient to continue with the oral iron liquid formulation for now because she cannot swallow the oral iron tablets. We will see her back for follow-up visit in 3 months for evaluation and to see if she has any improvement in her condition, otherwise we will consider her for IV iron infusion if needed. The patient and her husband agreed to the current plan. For the Raynaud's phenomena, the patient will continue her routine follow-up visit and evaluation by rheumatology. She was advised to call immediately if she has any concerning symptoms in the interval.  The total time spent in the appointment was 60 minutes. Disclaimer: This note was dictated with voice recognition software. Similar sounding words can inadvertently be transcribed and may be missed upon review. Eilleen Kempf, MD 04/11/21

## 2021-04-10 NOTE — Telephone Encounter (Signed)
Received a new hem referral from Dr. Dema Severin for thrombocytopenia. Donna Baldwin has been cld and scheduled to see [name] on 4/13 at 1:30pm w/labs at 1pm. Pt aware to arrive 15 minutes early.

## 2021-04-11 ENCOUNTER — Encounter: Payer: Self-pay | Admitting: Physician Assistant

## 2021-04-11 ENCOUNTER — Inpatient Hospital Stay: Payer: Medicare HMO

## 2021-04-11 ENCOUNTER — Other Ambulatory Visit: Payer: Self-pay

## 2021-04-11 ENCOUNTER — Inpatient Hospital Stay: Payer: Medicare HMO | Attending: Physician Assistant | Admitting: Physician Assistant

## 2021-04-11 VITALS — BP 153/61 | HR 43 | Temp 97.4°F | Resp 13 | Ht 63.0 in | Wt 116.9 lb

## 2021-04-11 DIAGNOSIS — D696 Thrombocytopenia, unspecified: Secondary | ICD-10-CM

## 2021-04-11 DIAGNOSIS — D509 Iron deficiency anemia, unspecified: Secondary | ICD-10-CM | POA: Diagnosis not present

## 2021-04-11 DIAGNOSIS — D649 Anemia, unspecified: Secondary | ICD-10-CM

## 2021-04-11 DIAGNOSIS — R634 Abnormal weight loss: Secondary | ICD-10-CM | POA: Diagnosis not present

## 2021-04-11 LAB — CMP (CANCER CENTER ONLY)
ALT: 12 U/L (ref 0–44)
AST: 21 U/L (ref 15–41)
Albumin: 4.2 g/dL (ref 3.5–5.0)
Alkaline Phosphatase: 95 U/L (ref 38–126)
Anion gap: 11 (ref 5–15)
BUN: 18 mg/dL (ref 8–23)
CO2: 28 mmol/L (ref 22–32)
Calcium: 9.8 mg/dL (ref 8.9–10.3)
Chloride: 105 mmol/L (ref 98–111)
Creatinine: 0.82 mg/dL (ref 0.44–1.00)
GFR, Estimated: >60 mL/min (ref >60)
Glucose, Bld: 94 mg/dL (ref 70–99)
Potassium: 4.1 mmol/L (ref 3.5–5.1)
Sodium: 144 mmol/L (ref 135–145)
Total Bilirubin: 0.4 mg/dL (ref 0.3–1.2)
Total Protein: 7.5 g/dL (ref 6.5–8.1)

## 2021-04-11 LAB — CBC WITH DIFFERENTIAL (CANCER CENTER ONLY)
Abs Immature Granulocytes: 0.02 10*3/uL (ref 0.00–0.07)
Basophils Absolute: 0 10*3/uL (ref 0.0–0.1)
Basophils Relative: 0 %
Eosinophils Absolute: 0 10*3/uL (ref 0.0–0.5)
Eosinophils Relative: 0 %
HCT: 37.5 % (ref 36.0–46.0)
Hemoglobin: 11.3 g/dL — ABNORMAL LOW (ref 12.0–15.0)
Immature Granulocytes: 0 %
Lymphocytes Relative: 32 %
Lymphs Abs: 1.9 10*3/uL (ref 0.7–4.0)
MCH: 22.4 pg — ABNORMAL LOW (ref 26.0–34.0)
MCHC: 30.1 g/dL (ref 30.0–36.0)
MCV: 74.4 fL — ABNORMAL LOW (ref 80.0–100.0)
Monocytes Absolute: 0.4 10*3/uL (ref 0.1–1.0)
Monocytes Relative: 7 %
Neutro Abs: 3.4 10*3/uL (ref 1.7–7.7)
Neutrophils Relative %: 61 %
Platelet Count: 155 10*3/uL (ref 150–400)
RBC: 5.04 MIL/uL (ref 3.87–5.11)
RDW: 15.4 % (ref 11.5–15.5)
WBC Count: 5.8 10*3/uL (ref 4.0–10.5)
nRBC: 0 % (ref 0.0–0.2)

## 2021-04-11 LAB — HEPATITIS PANEL, ACUTE
HCV Ab: NONREACTIVE
Hep A IgM: NONREACTIVE
Hep B C IgM: NONREACTIVE
Hepatitis B Surface Ag: NONREACTIVE

## 2021-04-11 LAB — FOLATE: Folate: 16.2 ng/mL (ref >5.9)

## 2021-04-11 LAB — IRON AND TIBC
Iron: 103 ug/dL (ref 41–142)
Saturation Ratios: 32 % (ref 21–57)
TIBC: 326 ug/dL (ref 236–444)
UIBC: 223 ug/dL (ref 120–384)

## 2021-04-11 LAB — LACTATE DEHYDROGENASE: LDH: 214 U/L — ABNORMAL HIGH (ref 98–192)

## 2021-04-11 LAB — VITAMIN B12: Vitamin B-12: 683 pg/mL (ref 180–914)

## 2021-04-11 LAB — FERRITIN: Ferritin: 143 ng/mL (ref 11–307)

## 2021-04-11 LAB — HIV ANTIBODY (ROUTINE TESTING W REFLEX): HIV Screen 4th Generation wRfx: NONREACTIVE

## 2021-04-12 ENCOUNTER — Telehealth: Payer: Self-pay | Admitting: Internal Medicine

## 2021-04-12 LAB — RHEUMATOID FACTOR: Rheumatoid fact SerPl-aCnc: <10 IU/mL (ref <14.0)

## 2021-04-12 NOTE — Telephone Encounter (Signed)
Scheduled per los. Called and spoke with patent. Confirmed appt

## 2021-04-13 LAB — PROTEIN ELECTROPHORESIS, SERUM, WITH REFLEX
A/G Ratio: 1.4 (ref 0.7–1.7)
Albumin ELP: 4 g/dL (ref 2.9–4.4)
Alpha-1-Globulin: 0.2 g/dL (ref 0.0–0.4)
Alpha-2-Globulin: 0.7 g/dL (ref 0.4–1.0)
Beta Globulin: 1 g/dL (ref 0.7–1.3)
Gamma Globulin: 0.9 g/dL (ref 0.4–1.8)
Globulin, Total: 2.8 g/dL (ref 2.2–3.9)
Total Protein ELP: 6.8 g/dL (ref 6.0–8.5)

## 2021-04-16 ENCOUNTER — Other Ambulatory Visit: Payer: Self-pay | Admitting: Medical Oncology

## 2021-04-16 DIAGNOSIS — D649 Anemia, unspecified: Secondary | ICD-10-CM

## 2021-04-16 MED ORDER — FERROUS SULFATE 300 (60 FE) MG/5ML PO SYRP: 300.0000 mg | ORAL_SOLUTION | Freq: Every day | ORAL | 3 refills | Status: DC

## 2021-04-17 ENCOUNTER — Telehealth: Payer: Self-pay | Admitting: Physician Assistant

## 2021-04-17 NOTE — Telephone Encounter (Signed)
I called the patient to let her know her pending lab studies were normal. I will mail a copy to her house. I was unable to reach her to I left a voicemail that her labs were normal and that I would mail a copy to her house. If she has questions, she is free to call us back. Otherwise, we will see her back as planned in 3 months to recheck her labs.

## 2021-05-01 DIAGNOSIS — D485 Neoplasm of uncertain behavior of skin: Secondary | ICD-10-CM | POA: Diagnosis not present

## 2021-05-01 DIAGNOSIS — L298 Other pruritus: Secondary | ICD-10-CM | POA: Diagnosis not present

## 2021-05-21 ENCOUNTER — Other Ambulatory Visit: Payer: Self-pay | Admitting: Cardiology

## 2021-06-01 ENCOUNTER — Telehealth: Payer: Self-pay

## 2021-06-01 NOTE — Telephone Encounter (Signed)
Appt at next available with me or CC  Thanks MJP

## 2021-06-01 NOTE — Telephone Encounter (Signed)
Pt called because she has been having palpitations. This morning 180/80 60. Her heart rate has also been dropping down to 30. Pt has extreme fatigue. Pt has felt like her head is ringing. Pts feet were swollen yesterday.   Pts bp readings from the last 3 days:  136/68 32 139/62 38  166/75 70 175/79 52

## 2021-06-01 NOTE — Telephone Encounter (Signed)
Pt is scheduling an appointment with the front now.

## 2021-06-04 NOTE — Progress Notes (Addendum)
Patient is here for follow up visit.  Subjective:  Patient ID: Donna Baldwin, female    DOB: 1941-03-26, 80 y.o.   MRN: [id number]  Chief Complaint  Patient presents with  . Hypertension  . Palpitations  . Bradycardia  . Edema    80 year old African-American female with labile hypertension, asymptomatic bradycardia, type II diabetes mellitus, chronic dysphagia, interstitial cystitis.  Patient presents for urgent visit with complaints of elevated blood pressure, heart rate, and palpitations for the last 1 week.  Patient reports she wakes up almost nightly with shortness of breath and heart racing symptoms the last several minutes.  She also has intermittent episodes of palpitations throughout the day lasting several seconds and typically associated with shortness of breath.  These episodes occur at rest, not with exertion.  Over the last 1 to 2 weeks patient has experienced increased fatigue.  She denies chest pain, syncope, near syncope, leg swelling.  Notably last visit patient was prescribed Repatha, however she has not started this due to concerns regarding side effects given her multiple medication intolerances/allergies.  Also since last visit patient reports she was advised by her PCP to discontinue aspirin 81 mg daily given recent diagnosis of anemia.  Current Outpatient Medications on File Prior to Visit  Medication Sig Dispense Refill  . BYSTOLIC 10 MG tablet TAKE 1 TABLET BY MOUTH EVERY DAY 90 tablet 0  . Calcium Carbonate Antacid (TUMS CHEWY BITES PO) Take 1 tablet by mouth daily as needed (acid reflux).    . cetirizine (ZYRTEC) 10 MG tablet Take 10 mg by mouth daily as needed for allergies.     [name] EPINEPHrine 0.3 mg/0.3 mL IJ SOAJ injection Inject 0.3 mg into the muscle once as needed (for anaphylaxis).  1  . ferrous sulfate 300 (60 Fe) MG/5ML syrup Take 5 mLs (300 mg total) by mouth daily. 150 mL 3  . Homeopathic Products ([place])     . ibuprofen  (ADVIL) 600 MG tablet Take 1 tablet (600 mg total) by mouth every 6 (six) hours as needed. 30 tablet 0  . LORazepam (ATIVAN) 0.5 MG tablet Take 0.5 mg by mouth daily as needed for anxiety.    . mometasone (ELOCON) 0.1 % cream Apply 1 application topically daily.    . protein supplement shake (PREMIER PROTEIN) LIQD Take 2 oz by mouth daily as needed. Pt states she has maybe one full container a day.    . Pyridoxine HCl (VITAMIN B6) 50 MG TABS 1 tablet     No current facility-administered medications on file prior to visit.    Cardiovascular studies: EKG 06/05/2021:  Sinus bradycardia at rate of 49 bpm. Left atrial enlargement. Normal axis. Incomplete RBBB. Non-specific T abnormality.   EKG 09/14/2020: Sinus bradycardia 44 bpm  Nonspecific ST-T changes  CTA 12/08/2019: 1. Coronary artery calcium score 108 Agatston units. This places the patient in the 54th percentile for age and gender, suggesting intermediate risk for future cardiac events. 2.  Nonobstructive ostial left main calcified plaque.  Upper Abdomen: Scattered foci of hepatic dome arterial hyperenhancement are diminutive and likely small perfusion anomalies. Normal imaged portions of the spleen, stomach.  Musculoskeletal: Thoracic spondylosis.  IMPRESSION: 1.  No acute findings in the imaged extracardiac chest. 2.  Aortic Atherosclerosis (ICD10-I70.0). 3. Esophageal air fluid level suggests dysmotility or gastroesophageal reflux.   Event monitor 01/07/2019- 02/05/2019: Sinus bradycardia, lowest HR 32 bpm @ 3:55 AM Occasional PVC's correlate with symptoms of fatigue. Highest HR 109 bpm.  Exercise Treadmill Stress Test 10/23/2018:  Indication: Bradycardia The patient exercised on Bruce protocol for 06:17 min. Patient achieved 7.50 METS and reached HR 110 bpm, which is 76 % of maximum age-predicted HR. Stress test terminated due to fatigue.   Exercise capacity was below average for age. HR Response to Exercise:  Sub-optimal secondary to limited exercise capacity BP Response to Exercise: Normal resting BP- appropriate response. Chest Pain: none. Arrhythmias: Occasional PVCs. Resting EKG demonstrates Normal sinus rhythm. ST Changes: With peak exercise there was no ST-T changes of ischemia.  Overall Impression: Submaximal stress test. Attenuated heart rate response without hypotension, lightheadedness Recommendations: Continue primary/secondary prevention. Consider alternate testing, if clinical suspicion for obstructive CAD is high.  Outside echocardiogram 07/06/2018: - Left ventricle: The cavity size was normal. Wall thickness was  normal. Systolic function was normal. The estimated ejection  fraction was in the range of 55% to 60%. Wall motion was normal;  there were no regional wall motion abnormalities. Doppler  parameters are consistent with abnormal left ventricular  relaxation (grade 1 diastolic dysfunction). - Left atrium: The atrium was mildly dilated. - Right atrium: The atrium was mildly dilated.   Impressions:   - Normal LV systolic function; mild diastolic dysfunction; mild  biatrial enlargement.  CT abdomen 06/2016: IMPRESSION: Nonspecific mild wall thickening of the distal stomach and duodenum can be seen with chronic gastritis or duodenitis. No evidence of bowel obstruction or perforation. Negative for abscess.  No other acute intra-abdominal or pelvic process by noncontrast CT. Aortoiliac atherosclerosis  Recent labs: 04/11/2021: Sodium 144, potassium 4.1, glucose 94, BUN 18, creatinine 0.82, AST 29, ALT 12, alk phos 24, GFR >60 Hemoglobin 11.3, hematocrit 37.5, MCV 74.4, platelet 155  04/19/2020: Glucose 136, BUN/Cr 7/0.72. EGFR >60. Na/K 141/3.9.  H/H 10.3/35. MCV 76. Platelets 108  11/2019: Chol 244, TG 77, HDL 94, LDL 141  2019: TSH 31. normal   Review of Systems  Constitutional: Positive for malaise/fatigue and night sweats. Negative for weight  gain.  Cardiovascular: Positive for palpitations. Negative for chest pain, claudication, leg swelling, near-syncope, orthopnea, paroxysmal nocturnal dyspnea and syncope.  Respiratory: Positive for shortness of breath.   Hematologic/Lymphatic: Does not bruise/bleed easily.  Gastrointestinal: Negative for melena.  Neurological: Negative for dizziness and weakness.       Objective:     Vitals:   06/05/21 1003 06/05/21 1018  BP: (!) 179/77 (!) 162/63  Pulse: (!) 58 (!) 47  Resp: 17   Temp: 98 F (36.7 C)   SpO2: 98% 99%     Physical Exam Vitals and nursing note reviewed.  Constitutional:      General: She is not in acute distress.    Appearance: She is underweight.  HENT:     Head: Normocephalic and atraumatic.  Neck:     Vascular: No JVD.  Cardiovascular:     Rate and Rhythm: Normal rate and regular rhythm.     Pulses: Intact distal pulses.     Heart sounds: Normal heart sounds, S1 normal and S2 normal. No murmur heard. No gallop.   Pulmonary:     Effort: Pulmonary effort is normal. No respiratory distress.     Breath sounds: Normal breath sounds. No wheezing, rhonchi or rales.  Musculoskeletal:     Right lower leg: No edema.     Left lower leg: No edema.  Neurological:     Mental Status: She is alert.         Assessment & Recommendations:   80 year old African-American female  with labile hypertension, hyperlipidemia, nonobstructive CAD, type II diabetes mellitus, chronic dysphagia, interstitial cystitis,   Essential hypertension: Uncontrolled.  Although patient remains bradycardic, this has been stable with ongoing Bystolic use. Continue Bystolic Increase amlodipine from 5 mg to 10 mg daily Continue to monitor blood pressure on a regular basis, she is now enrolled in remote patient monitoring with our clinical pharmacist in a [place].  Dyspnea:  We will obtain echocardiogram to evaluate underlying LV systolic function. Patient's symptoms also concerning for  sleep apnea, however patient never followed up with previous referral by Dr. Virgina Jock for sleep evaluation.  We will therefore resend referral for sleep study.  Palpitations:  We will obtain amatory cardiac telemetry to evaluate for underlying cardiac arrhythmias.  Sinus bradycardia: Patient has longstanding history of sinus bradycardia, which is stable.  Bystolic may be contributing to low heart rate.  However patient has had lower heart rate on Bystolic in the past without symptoms, therefore do not suspect Bystolic use to be significantly contributing to current presentation.  Hyperlipidemia: Statin intolerant.  Given mild nonobstructive CAD and high HDL ratio patient prefers to hold off on initiation of PCSK9 inhibitor. Will consider restarting Repatha in the future, however patient is hesitant at this time given current symptoms  CAD without angina: Recommend continued medical management of cardiovascular risk factors. Patient has discontinued aspirin at the recommendation of PCP in the context of anemia, however would recommend reinitiation of aspirin 81 mg if at all tolerated.  Will defer further management to PCP.  Follow-up in 8 weeks, sooner if needed, for results of cardiac testing.   This was a 45-minute encounter with face-to-face counseling, medical records review, coordination of care, explanation of complex medical issues, complex medical decision making.      Alethia Berthold, PA-C 06/05/2021, 1:22 PM Office: [phone]  Addendum: Patient walked in the hallway in our office.  Resting heart rate 47 bpm, heart rate increased to 64 bpm with walking.  Patient exhibited chronotropic competency.

## 2021-06-05 ENCOUNTER — Other Ambulatory Visit: Payer: Self-pay

## 2021-06-05 ENCOUNTER — Ambulatory Visit: Payer: Medicare HMO | Admitting: Student

## 2021-06-05 ENCOUNTER — Encounter: Payer: Self-pay | Admitting: Student

## 2021-06-05 ENCOUNTER — Inpatient Hospital Stay: Payer: Medicare HMO

## 2021-06-05 VITALS — BP 162/63 | HR 47 | Temp 98.0°F | Resp 17 | Ht 63.0 in | Wt 118.4 lb

## 2021-06-05 DIAGNOSIS — R002 Palpitations: Secondary | ICD-10-CM

## 2021-06-05 DIAGNOSIS — I10 Essential (primary) hypertension: Secondary | ICD-10-CM

## 2021-06-05 DIAGNOSIS — R001 Bradycardia, unspecified: Secondary | ICD-10-CM | POA: Diagnosis not present

## 2021-06-05 DIAGNOSIS — R0609 Other forms of dyspnea: Secondary | ICD-10-CM | POA: Diagnosis not present

## 2021-06-05 DIAGNOSIS — R0600 Dyspnea, unspecified: Secondary | ICD-10-CM

## 2021-06-05 MED ORDER — AMLODIPINE BESYLATE 10 MG PO TABS: 10.0000 mg | ORAL_TABLET | Freq: Every day | ORAL | 3 refills | Status: DC

## 2021-06-14 ENCOUNTER — Telehealth: Payer: Self-pay

## 2021-06-14 DIAGNOSIS — R001 Bradycardia, unspecified: Secondary | ICD-10-CM | POA: Diagnosis not present

## 2021-06-14 DIAGNOSIS — R002 Palpitations: Secondary | ICD-10-CM | POA: Diagnosis not present

## 2021-06-14 NOTE — Telephone Encounter (Signed)
Pt having bradycardia hr 39 bmp for 30 seconds on 06/07/21 at 12:55 am 37 bmp for 30 seconds on 06/09/21 at 9:37 am, pause lasting 3.1 seconds and 2 episodes of SVT

## 2021-06-18 DIAGNOSIS — J0100 Acute maxillary sinusitis, unspecified: Secondary | ICD-10-CM | POA: Diagnosis not present

## 2021-06-18 DIAGNOSIS — R0981 Nasal congestion: Secondary | ICD-10-CM | POA: Diagnosis not present

## 2021-06-19 ENCOUNTER — Telehealth: Payer: Self-pay | Admitting: Allergy & Immunology

## 2021-06-19 NOTE — Telephone Encounter (Signed)
Pt states she wants a consult with Dr Ernst Bowler bout medicine she was prescribed and doesn't want it to interfere with her getting the booster shot on July 12th.

## 2021-06-19 NOTE — Telephone Encounter (Signed)
Please advise 

## 2021-06-20 NOTE — Telephone Encounter (Signed)
Only systemic steroids like prednisone or immunosuppressants such as chemotherapy would affect her immune response to the COVID-19 booster or any vaccine for that matter.  Everything else should be fine.  Salvatore Marvel, MD [facility] [place]

## 2021-06-20 NOTE — Telephone Encounter (Signed)
Called and left a voicemail asking for the patient to return call to discuss.  

## 2021-06-22 DIAGNOSIS — R001 Bradycardia, unspecified: Secondary | ICD-10-CM | POA: Diagnosis not present

## 2021-06-22 DIAGNOSIS — R002 Palpitations: Secondary | ICD-10-CM | POA: Diagnosis not present

## 2021-06-22 NOTE — Telephone Encounter (Signed)
Those are fine to use before the COVID-vaccine.

## 2021-06-22 NOTE — Telephone Encounter (Signed)
These are ok for the patient to take and won't interfere with the COVID vaccine will it?

## 2021-06-22 NOTE — Telephone Encounter (Signed)
Patient called back and was advised of Dr. Bing Quarry note. Patient wanted him to know she was using a nasal spray, fluticasone 20mcg for 30 days and doxycycline hyclate 100mg  caps for 10 days which she started on Tuesday 6/21.  Please advise.

## 2021-06-22 NOTE — Telephone Encounter (Signed)
Called and informed the patient. Patient verbalized understanding.  

## 2021-06-25 NOTE — Progress Notes (Signed)
Please inform patient the monitor showed low heart rate. I would like to see her in the office to discuss further. Please move appt up to some time in the next 2 weeks.

## 2021-06-25 NOTE — Progress Notes (Signed)
Called pt to inform her about her monitor. Transferred pt to the front to resch her earlier.

## 2021-06-27 DIAGNOSIS — R030 Elevated blood-pressure reading, without diagnosis of hypertension: Secondary | ICD-10-CM | POA: Diagnosis not present

## 2021-06-27 DIAGNOSIS — R070 Pain in throat: Secondary | ICD-10-CM | POA: Diagnosis not present

## 2021-07-03 ENCOUNTER — Ambulatory Visit: Payer: Medicare HMO | Admitting: Student

## 2021-07-03 ENCOUNTER — Other Ambulatory Visit: Payer: Self-pay

## 2021-07-03 ENCOUNTER — Encounter: Payer: Self-pay | Admitting: Student

## 2021-07-03 VITALS — BP 146/70 | HR 55 | Temp 98.0°F | Ht 63.0 in | Wt 118.0 lb

## 2021-07-03 DIAGNOSIS — R001 Bradycardia, unspecified: Secondary | ICD-10-CM | POA: Diagnosis not present

## 2021-07-03 DIAGNOSIS — I10 Essential (primary) hypertension: Secondary | ICD-10-CM | POA: Diagnosis not present

## 2021-07-03 DIAGNOSIS — E782 Mixed hyperlipidemia: Secondary | ICD-10-CM | POA: Diagnosis not present

## 2021-07-03 DIAGNOSIS — R002 Palpitations: Secondary | ICD-10-CM

## 2021-07-03 MED ORDER — AMLODIPINE BESYLATE 2.5 MG PO TABS: 7.5000 mg | ORAL_TABLET | Freq: Every day | ORAL | Status: DC

## 2021-07-03 NOTE — Progress Notes (Addendum)
Patient is here for follow up visit.  Subjective:  Patient ID: Donna Baldwin, female    DOB: Dec 04, 1941, 80 y.o.   MRN: [id number]  Chief Complaint  Patient presents with   Hypertension   Follow-up   Palpitations   Results   Bradycardia    80 year old African-American female with labile hypertension, asymptomatic bradycardia, type II diabetes mellitus, chronic dysphagia, interstitial cystitis.  Patient presents for 8-week follow-up for results of cardiac testing.  Her last visit ordered repeat echocardiogram, referred for sleep evaluation, and increased amlodipine from 5 mg to 10 mg daily, however patient has only been taking 7.5 mg daily.  Also recommended restarting Repatha, however patient was hesitant to do this at last visit.  Echocardiogram results are pending, Cardiac monitor revealed 2 episodes of atrial tachycardia as well as a single sinus pause lasting 3.1 seconds at 4:30 AM which was asymptomatic.  Patient triggered events correlated with sinus bradycardia, PVCs, PACs, and ventricular trigeminy.  Since last visit patient was seen by her PCP who diagnosed patient with sinus infection she has been treated with antibiotics and Flonase.  With this treatment patient's shortness of breath has significantly improved and she is now sleeping through the night without issue.  Her home blood pressure readings have also improved to 120-130/70s mmHg. She denies chest pain, syncope, near syncope, leg swelling.   Current Outpatient Medications on File Prior to Visit  Medication Sig Dispense Refill   BYSTOLIC 10 MG tablet TAKE 1 TABLET BY MOUTH EVERY DAY 90 tablet 0   Calcium Carbonate Antacid (TUMS CHEWY BITES PO) Take 1 tablet by mouth daily as needed (acid reflux).     cetirizine (ZYRTEC) 10 MG tablet Take 10 mg by mouth daily as needed for allergies.      EPINEPHrine 0.3 mg/0.3 mL IJ SOAJ injection Inject 0.3 mg into the muscle once as needed (for anaphylaxis).  1   fluticasone  (FLONASE) 50 MCG/ACT nasal spray Place into both nostrils.     Homeopathic Products ([employer] [place])      ibuprofen (ADVIL) 600 MG tablet Take 1 tablet (600 mg total) by mouth every 6 (six) hours as needed. 30 tablet 0   LORazepam (ATIVAN) 0.5 MG tablet Take 0.5 mg by mouth daily as needed for anxiety.     mometasone (ELOCON) 0.1 % cream Apply 1 application topically daily.     protein supplement shake (PREMIER PROTEIN) LIQD Take 2 oz by mouth daily as needed. Pt states she has maybe one full container a day.     Pyridoxine HCl (VITAMIN B6) 50 MG TABS 1 tablet     ferrous sulfate 300 (60 Fe) MG/5ML syrup Take 5 mLs (300 mg total) by mouth daily. (Patient not taking: Reported on 07/03/2021) 150 mL 3   No current facility-administered medications on file prior to visit.    Cardiovascular studies: Echocardiogram: pending   Ambulatory cardiac telemetry 06/05/2021 - 06/12/2021: Predominant rhythm was sinus.  Maximum heart rate 125, minimum heart rate 32, average heart rate 47 bpm.  Patient with 2 episodes of atrial tachycardia with longest lasting 12.6 seconds.  Patient had single sinus pause lasting 3.1 seconds at 4:30 AM, asymptomatic.  Patient triggered events correlated with sinus bradycardia, PVCs, PACs, and ventricular trigeminy.  No evidence of high degree AV block or ventricular tachycardia.  EKG 06/05/2021:  Sinus bradycardia at rate of 49 bpm. Left atrial enlargement. Normal axis. Incomplete RBBB. Non-specific T abnormality.   EKG 09/14/2020: Sinus bradycardia 44  bpm  Nonspecific ST-T changes  CTA 12/08/2019: 1. Coronary artery calcium score 108 Agatston units. This places the patient in the 54th percentile for age and gender, suggesting intermediate risk for future cardiac events. 2.  Nonobstructive ostial left main calcified plaque.  Upper Abdomen: Scattered foci of hepatic dome arterial hyperenhancement are diminutive and likely small perfusion anomalies. Normal imaged  portions of the spleen, stomach.   Musculoskeletal: Thoracic spondylosis.   IMPRESSION: 1.  No acute findings in the imaged extracardiac chest. 2.  Aortic Atherosclerosis (ICD10-I70.0). 3. Esophageal air fluid level suggests dysmotility or gastroesophageal reflux.    Event monitor 01/07/2019- 02/05/2019: Sinus bradycardia, lowest HR 32 bpm @ 3:55 AM Occasional PVC's correlate with symptoms of fatigue. Highest HR 109 bpm.  Exercise Treadmill Stress Test 10/23/2018:  Indication: Bradycardia The patient exercised on Bruce protocol for 06:17  min. Patient achieved  7.50 METS and reached HR  110 bpm, which is  76 % of maximum age-predicted HR.  Stress test terminated due to fatigue.   Exercise capacity was below average for age. HR Response to Exercise: Sub-optimal secondary to limited exercise capacity BP Response to Exercise: Normal resting BP- appropriate response. Chest Pain: none. Arrhythmias: Occasional PVCs. Resting EKG demonstrates Normal sinus rhythm. ST Changes: With peak exercise there was no ST-T changes of ischemia.  Overall Impression: Submaximal stress test. Attenuated heart rate response without hypotension, lightheadedness Recommendations: Continue primary/secondary prevention. Consider alternate testing, if clinical suspicion for  obstructive CAD is  high.  Outside echocardiogram 07/06/2018: - Left ventricle: The cavity size was normal. Wall thickness was   normal. Systolic function was normal. The estimated ejection   fraction was in the range of 55% to 60%. Wall motion was normal;   there were no regional wall motion abnormalities. Doppler   parameters are consistent with abnormal left ventricular   relaxation (grade 1 diastolic dysfunction). - Left atrium: The atrium was mildly dilated. - Right atrium: The atrium was mildly dilated.   Impressions:   - Normal LV systolic function; mild diastolic dysfunction; mild   biatrial enlargement.  CT abdomen  06/2016: IMPRESSION: Nonspecific mild wall thickening of the distal stomach and duodenum can be seen with chronic gastritis or duodenitis. No evidence of bowel obstruction or perforation. Negative for abscess.  No other acute intra-abdominal or pelvic process by noncontrast CT. Aortoiliac atherosclerosis  Recent labs: CMP Latest Ref Rng & Units 04/11/2021 04/19/2020 04/18/2020  Glucose 70 - 99 mg/dL 94 136(H) -  BUN 8 - 23 mg/dL 18 7(L) -  Creatinine 0.44 - 1.00 mg/dL 0.82 0.72 0.62  Sodium 135 - 145 mmol/L 144 141 -  Potassium 3.5 - 5.1 mmol/L 4.1 3.9 -  Chloride 98 - 111 mmol/L 105 108 -  CO2 22 - 32 mmol/L 28 25 -  Calcium 8.9 - 10.3 mg/dL 9.8 9.2 -  Total Protein 6.5 - 8.1 g/dL 7.5 - -  Total Bilirubin 0.3 - 1.2 mg/dL 0.4 - -  Alkaline Phos 38 - 126 U/L 95 - -  AST 15 - 41 U/L 21 - -  ALT 0 - 44 U/L 12 - -   CBC Latest Ref Rng & Units 04/11/2021 09/14/2020 04/19/2020  WBC 4.0 - 10.5 K/uL 5.8 3.4 6.5  Hemoglobin 12.0 - 15.0 g/dL 11.3(L) 12.4 10.3(L)  Hematocrit 36.0 - 46.0 % 37.5 41.9 35.0(L)  Platelets 150 - 400 K/uL 155 - 108(L)   Lipid Panel     Component Value Date/Time   CHOL 248 (H) 10/31/2020 8676  TRIG 77 10/31/2020 0844   HDL 94 10/31/2020 0844   CHOLHDL 2.6 10/31/2020 0844   CHOLHDL 3.3 07/12/2014 0936   VLDL 14 07/12/2014 0936   LDLCALC 141 (H) 10/31/2020 0844   HEMOGLOBIN A1C No results found for: HGBA1C, MPG TSH No results for input(s): TSH in the last 8760 hours.  04/11/2021: Sodium 144, potassium 4.1, glucose 94, BUN 18, creatinine 0.82, AST 29, ALT 12, alk phos 24, GFR >60 Hemoglobin 11.3, hematocrit 37.5, MCV 74.4, platelet 155  04/19/2020: Glucose 136, BUN/Cr 7/0.72. EGFR >60. Na/K 141/3.9.  H/H 10.3/35. MCV 76. Platelets 108  11/2019: Chol 244, TG 77, HDL 94, LDL 141  2019: TSH 31. normal   Review of Systems  Constitutional: Negative for malaise/fatigue (improved) and weight gain.  Cardiovascular:  Positive for palpitations. Negative for  chest pain, claudication, leg swelling, near-syncope, orthopnea, paroxysmal nocturnal dyspnea and syncope.  Respiratory:  Negative for shortness of breath (improved).   Hematologic/Lymphatic: Does not bruise/bleed easily.  Gastrointestinal:  Negative for melena.  Neurological:  Negative for dizziness and weakness.      Objective:     Vitals:   07/03/21 0934  BP: (!) 146/70  Pulse: (!) 55  Temp: 98 F (36.7 C)  SpO2: 98%     Physical Exam Vitals reviewed.  Constitutional:      General: She is not in acute distress.    Appearance: She is underweight.  Neck:     Vascular: No JVD.  Cardiovascular:     Rate and Rhythm: Regular rhythm. Bradycardia present.     Pulses: Intact distal pulses.     Heart sounds: Normal heart sounds, S1 normal and S2 normal. No murmur heard.   No gallop.  Pulmonary:     Effort: Pulmonary effort is normal. No respiratory distress.     Breath sounds: Normal breath sounds. No wheezing, rhonchi or rales.  Musculoskeletal:     Right lower leg: No edema.     Left lower leg: No edema.  Neurological:     Mental Status: She is alert.        Assessment & Recommendations:   80 year old African-American female with labile hypertension, hyperlipidemia, nonobstructive CAD, type II diabetes mellitus, chronic dysphagia, interstitial cystitis,   Essential hypertension: Controlled.  Continue amlodipine 7.5 mg daily, Bystolic Continue to monitor blood pressure on a regular basis at home. Although patient remains bradycardic this is been stable with ongoing Bystolic use.  Dyspnea:  Patient's symptoms of dyspnea have significantly improved with treatment of sinus infection.  We will still obtain echocardiogram to evaluate underlying LV systolic function.  Also recommend patient proceed with sleep apnea evaluation with Dr. Brett Fairy scheduled for next month.  Palpitations:  Cardiac monitor revealed patient's symptoms correlated with sinus bradycardia as well as  PVCs and PACs, discussed at length with patient regarding PVCs and PACs.  Also reviewed the patient had 2 episodes of atrial tachycardia and a single sinus pause lasting 3.1 seconds while asleep.  Both atrial tachycardia and sinus pause were asymptomatic, therefore we will continue watchful waiting. Could consider further ischemic evaluation, however shared decision was to hold off on this at this time as patient is feeling much better than she was at last visit.  Hyperlipidemia: Statin intolerant.  Given mild nonobstructive CAD and high HDL ratio patient prefers to hold off on initiation of PCSK9 inhibitor. Will repeat lipid profile testing.  CAD without angina: Continue medical management of cardiovascular risk factors.  Will defer management of aspirin to PCP  in the context of anemia which PCP is managing for patient.  Follow-up in 6 months, sooner if needed, for hypertension, hyperlipidemia, bradycardia.   Alethia Berthold, PA-C 07/03/2021, 11:31 AM Office: [phone]

## 2021-07-04 ENCOUNTER — Ambulatory Visit: Payer: Medicare HMO

## 2021-07-04 DIAGNOSIS — R002 Palpitations: Secondary | ICD-10-CM | POA: Diagnosis not present

## 2021-07-04 DIAGNOSIS — R001 Bradycardia, unspecified: Secondary | ICD-10-CM

## 2021-07-06 NOTE — Progress Notes (Signed)
Grade 2 diastolic dysfunction normal LVEF. Mild valve leaking. Will discuss further at next office visit.

## 2021-07-06 NOTE — Progress Notes (Signed)
Diastolic function means the heart has to relax to receive the blood so it can pump the blood out. If relaxation is impaired, then the blood coming to the heart is restricted and can cause dyspnea and reduced functional capacity and fluid build up. Exercise, weight loss, control of BP, salt restriction will improve this.

## 2021-07-06 NOTE — Progress Notes (Signed)
Called patient, NA, LMAM

## 2021-07-06 NOTE — Progress Notes (Signed)
How do I explain this to her?

## 2021-07-06 NOTE — Progress Notes (Signed)
Pt aware.

## 2021-07-09 ENCOUNTER — Other Ambulatory Visit: Payer: Self-pay | Admitting: Medical Oncology

## 2021-07-09 ENCOUNTER — Inpatient Hospital Stay: Payer: Medicare HMO

## 2021-07-09 ENCOUNTER — Other Ambulatory Visit: Payer: Self-pay

## 2021-07-09 ENCOUNTER — Inpatient Hospital Stay: Payer: Medicare HMO | Attending: Physician Assistant | Admitting: Internal Medicine

## 2021-07-09 VITALS — BP 164/74 | HR 65 | Temp 98.1°F | Resp 17 | Ht 63.0 in | Wt 117.5 lb

## 2021-07-09 DIAGNOSIS — I10 Essential (primary) hypertension: Secondary | ICD-10-CM | POA: Diagnosis not present

## 2021-07-09 DIAGNOSIS — D696 Thrombocytopenia, unspecified: Secondary | ICD-10-CM | POA: Diagnosis not present

## 2021-07-09 DIAGNOSIS — D649 Anemia, unspecified: Secondary | ICD-10-CM

## 2021-07-09 DIAGNOSIS — D509 Iron deficiency anemia, unspecified: Secondary | ICD-10-CM | POA: Diagnosis not present

## 2021-07-09 LAB — CBC WITH DIFFERENTIAL (CANCER CENTER ONLY)
Abs Immature Granulocytes: 0.01 10*3/uL (ref 0.00–0.07)
Basophils Absolute: 0 10*3/uL (ref 0.0–0.1)
Basophils Relative: 0 %
Eosinophils Absolute: 0.1 10*3/uL (ref 0.0–0.5)
Eosinophils Relative: 3 %
HCT: 36.7 % (ref 36.0–46.0)
Hemoglobin: 11.3 g/dL — ABNORMAL LOW (ref 12.0–15.0)
Immature Granulocytes: 0 %
Lymphocytes Relative: 33 %
Lymphs Abs: 1.5 10*3/uL (ref 0.7–4.0)
MCH: 22.8 pg — ABNORMAL LOW (ref 26.0–34.0)
MCHC: 30.8 g/dL (ref 30.0–36.0)
MCV: 74 fL — ABNORMAL LOW (ref 80.0–100.0)
Monocytes Absolute: 0.5 10*3/uL (ref 0.1–1.0)
Monocytes Relative: 10 %
Neutro Abs: 2.6 10*3/uL (ref 1.7–7.7)
Neutrophils Relative %: 54 %
Platelet Count: 130 10*3/uL — ABNORMAL LOW (ref 150–400)
RBC: 4.96 MIL/uL (ref 3.87–5.11)
RDW: 14.8 % (ref 11.5–15.5)
WBC Count: 4.7 10*3/uL (ref 4.0–10.5)
nRBC: 0 % (ref 0.0–0.2)

## 2021-07-09 MED ORDER — CLONIDINE HCL 0.1 MG PO TABS
ORAL_TABLET | ORAL | Status: AC
  Filled 2021-07-09: qty 2

## 2021-07-09 MED ORDER — CLONIDINE HCL 0.1 MG PO TABS
0.2000 mg | ORAL_TABLET | ORAL | Status: AC
Start: 2021-07-09 — End: 2021-07-09
  Administered 2021-07-09: 0.2 mg via ORAL

## 2021-07-09 NOTE — Progress Notes (Signed)
[place] [phone]   [phone]  OFFICE PROGRESS NOTE  Harlan Stains, MD [place] [id number]  DIAGNOSIS:  1) Thrombocytopenia likely ITP. 2) microcytic anemia of unclear etiology.   PRIOR THERAPY: None  CURRENT THERAPY: Observation  INTERVAL HISTORY: Donna Baldwin 80 y.o. female returns to the clinic today for follow-up visit accompanied by her husband.  The patient is feeling fine today with no concerning complaints.  Her fatigue is much better after starting taking the oral iron tablets.  She denied having any current chest pain, shortness of breath, cough or hemoptysis.  She denied having any fever or chills.  She has no nausea, vomiting, diarrhea or constipation.  She has no headache or visual changes.  She has no weight loss or night sweats.  She is here today for evaluation and repeat blood work.  MEDICAL HISTORY: Past Medical History:  Diagnosis Date   Acid reflux    Anemia    Anxiety    Atherosclerosis    Back pain    Coronary artery disease    GERD (gastroesophageal reflux disease)    HTN (hypertension) 12/03/2013   CT scan-no pheochromocytoma   Hyperlipidemia    Hypertension    Hypertensive heart disease without heart failure    Thrombocytopenia (HCC)    Vitamin D deficiency     ALLERGIES:  is allergic to acetaminophen; beef allergy; beef-derived products; codeine; hydralazine; iodine; ivp dye [iodinated diagnostic agents]; latex; morphine and related; penicillins; sertraline; shellfish allergy; solu-medrol [methylprednisolone]; sulfa antibiotics; barbiturates; bee pollen; bee venom; diovan [valsartan]; gabapentin; livalo [pitavastatin]; other; oxycodone; promethazine-dm; tape; wellness essentials blood sugr [nutritional supplements]; albuterol; ciprofloxacin; crestor [rosuvastatin]; famotidine; pred forte [prednisolone acetate]; welchol [colesevelam]; celebrex [celecoxib]; lanolin;  mobic [meloxicam]; nickel; nitrates, organic; petrolatum; soy allergy; and strawberry extract.  MEDICATIONS:  Current Outpatient Medications  Medication Sig Dispense Refill   amLODipine (NORVASC) 2.5 MG tablet Take 3 tablets (7.5 mg total) by mouth daily.     BYSTOLIC 10 MG tablet TAKE 1 TABLET BY MOUTH EVERY DAY 90 tablet 0   Calcium Carbonate Antacid (TUMS CHEWY BITES PO) Take 1 tablet by mouth daily as needed (acid reflux).     cetirizine (ZYRTEC) 10 MG tablet Take 10 mg by mouth daily as needed for allergies.      EPINEPHrine 0.3 mg/0.3 mL IJ SOAJ injection Inject 0.3 mg into the muscle once as needed (for anaphylaxis).  1   ferrous sulfate 300 (60 Fe) MG/5ML syrup Take 5 mLs (300 mg total) by mouth daily. (Patient not taking: Reported on 07/03/2021) 150 mL 3   fluticasone (FLONASE) 50 MCG/ACT nasal spray Place into both nostrils.     Homeopathic Products (FRANKINCENSE UPLIFTING IN)      ibuprofen (ADVIL) 600 MG tablet Take 1 tablet (600 mg total) by mouth every 6 (six) hours as needed. 30 tablet 0   LORazepam (ATIVAN) 0.5 MG tablet Take 0.5 mg by mouth daily as needed for anxiety.     mometasone (ELOCON) 0.1 % cream Apply 1 application topically daily.     protein supplement shake (PREMIER PROTEIN) LIQD Take 2 oz by mouth daily as needed. Pt states she has maybe one full container a day.     Pyridoxine HCl (VITAMIN B6) 50 MG TABS 1 tablet     No current facility-administered medications for this visit.    SURGICAL HISTORY:  Past Surgical History:  Procedure Laterality Date   ESOPHAGEAL  MANOMETRY N/A 03/15/2020   Procedure: ESOPHAGEAL MANOMETRY (EM);  Surgeon: Wilford Corner, MD;  Location: [facility];  Service: Endoscopy;  Laterality: N/A;   EYE SURGERY     HELLER MYOTOMY N/A 04/18/2020   Procedure: LAPAROSCOPIC HELLER MYOTOMY WITH UPPER ENDOSCOPY;  Surgeon: Kinsinger, Arta Bruce, MD;  Location: [facility];  Service: General;  Laterality: N/A;   [place] IMPEDANCE STUDY N/A 03/15/2020    Procedure: [place] IMPEDANCE STUDY;  Surgeon: Wilford Corner, MD;  Location: [facility];  Service: Endoscopy;  Laterality: N/A;   SHOULDER SURGERY Bilateral    TONSILLECTOMY      REVIEW OF SYSTEMS:  A comprehensive review of systems was negative except for: Constitutional: positive for fatigue   PHYSICAL EXAMINATION: General appearance: alert, cooperative, and no distress Head: Normocephalic, without obvious abnormality, atraumatic Neck: no adenopathy, no JVD, supple, symmetrical, trachea midline, and thyroid not enlarged, symmetric, no tenderness/mass/nodules Lymph nodes: Cervical, supraclavicular, and axillary nodes normal. Resp: clear to auscultation bilaterally Back: symmetric, no curvature. ROM normal. No CVA tenderness. Cardio: regular rate and rhythm, S1, S2 normal, no murmur, click, rub or gallop GI: soft, non-tender; bowel sounds normal; no masses,  no organomegaly Extremities: extremities normal, atraumatic, no cyanosis or edema  ECOG PERFORMANCE STATUS: 1 - Symptomatic but completely ambulatory  Blood pressure (!) 180/69, pulse 65, temperature 98.1 F (36.7 C), temperature source Oral, resp. rate 17, height 5\' 3"  (1.6 m), weight 117 lb 8 oz (53.3 kg), SpO2 100 %.  LABORATORY DATA: Lab Results  Component Value Date   WBC 4.7 07/09/2021   HGB 11.3 (L) 07/09/2021   HCT 36.7 07/09/2021   MCV 74.0 (L) 07/09/2021   PLT 130 (L) 07/09/2021      Chemistry      Component Value Date/Time   NA 144 04/11/2021 1300   NA 144 11/24/2019 0851   K 4.1 04/11/2021 1300   CL 105 04/11/2021 1300   CO2 28 04/11/2021 1300   BUN 18 04/11/2021 1300   BUN 13 11/24/2019 0851   CREATININE 0.82 04/11/2021 1300   CREATININE 0.87 04/03/2017 1059      Component Value Date/Time   CALCIUM 9.8 04/11/2021 1300   ALKPHOS 95 04/11/2021 1300   AST 21 04/11/2021 1300   ALT 12 04/11/2021 1300   BILITOT 0.4 04/11/2021 1300       RADIOGRAPHIC STUDIES: LONG TERM MONITOR (3-14 DAYS)  Result  Date: 06/22/2021 Ambulatory cardiac telemetry 06/05/2021 - 06/12/2021: Predominant rhythm was sinus.  Maximum heart rate 125, minimum heart rate 32, average heart rate 47 bpm.  Patient with 2 episodes of atrial tachycardia with longest lasting 12.6 seconds.  Patient had single sinus pause lasting 3.1 seconds at 4:30 AM, asymptomatic.  Patient triggered events correlated with sinus bradycardia, PVCs, PACs, and ventricular trigeminy.  No evidence of high degree AV block or ventricular tachycardia.   PCV ECHOCARDIOGRAM COMPLETE  Result Date: 07/06/2021 Echocardiogram 07/04/2021: Left ventricle cavity is normal in size and wall thickness. Normal global wall motion. Normal LV systolic function with EF 54%. Doppler evidence of grade II (pseudonormal) diastolic dysfunction, elevated LAP. Calculated EF 54%. Left atrial cavity is mildly dilated. Mild (Grade I) mitral regurgitation. Mild tricuspid regurgitation. No evidence of pulmonary hypertension.   ASSESSMENT AND PLAN: This is a very pleasant 80 years old white female with mild thrombocytopenia likely ITP in addition to mild microcytic anemia questionable to be secondary to iron deficiency versus hemoglobinopathy. She underwent extensive blood work last visit for her thrombocytopenia and anemia that were  unremarkable. The patient has no clear family history of sickle cell disease or thalassemia. I will check hemoglobin electrophoresis with her next blood work.  I will also check her stool for Hemoccult. In the meantime I recommended for her to continue on the oral iron tablets for now. I will see her back for follow-up visit in 3 months for evaluation and repeat blood work. For the hypertension we will recheck her blood pressure and if systolic above 010, will give the patient a dose of clonidine in the clinic.  She was advised to take her blood pressure medication and to monitor it closely with her primary care physician. She was advised to call immediately if she  has any concerning symptoms in the interval.  The patient voices understanding of current disease status and treatment options and is in agreement with the current care plan.  All questions were answered. The patient knows to call the clinic with any problems, questions or concerns. We can certainly see the patient much sooner if necessary.  The total time spent in the appointment was 20 minutes.  Disclaimer: This note was dictated with voice recognition software. Similar sounding words can inadvertently be transcribed and may not be corrected upon review.

## 2021-07-10 ENCOUNTER — Encounter: Payer: Self-pay | Admitting: Allergy & Immunology

## 2021-07-10 ENCOUNTER — Telehealth: Payer: Self-pay | Admitting: Internal Medicine

## 2021-07-10 ENCOUNTER — Ambulatory Visit: Payer: Medicare HMO

## 2021-07-10 ENCOUNTER — Ambulatory Visit: Payer: Medicare HMO | Admitting: Allergy & Immunology

## 2021-07-10 VITALS — BP 140/80 | HR 52 | Temp 97.6°F | Resp 16 | Ht 63.0 in | Wt 118.2 lb

## 2021-07-10 DIAGNOSIS — Z889 Allergy status to unspecified drugs, medicaments and biological substances status: Secondary | ICD-10-CM | POA: Diagnosis not present

## 2021-07-10 DIAGNOSIS — Z23 Encounter for immunization: Secondary | ICD-10-CM

## 2021-07-10 DIAGNOSIS — Z9189 Other specified personal risk factors, not elsewhere classified: Secondary | ICD-10-CM | POA: Diagnosis not present

## 2021-07-10 DIAGNOSIS — T7840XD Allergy, unspecified, subsequent encounter: Secondary | ICD-10-CM

## 2021-07-10 NOTE — Patient Instructions (Signed)
1. Concern for COVID vaccine reaction - You tolerated your COVID vaccine today. - Touch base with [place] in the fall to see if we have a different vaccine for the other variants.  2. Follow up as needed.    Please inform us of any Emergency Department visits, hospitalizations, or changes in symptoms. Call us before going to the ED for breathing or allergy symptoms since we might be able to fit you in for a sick visit. Feel free to contact us anytime with any questions, problems, or concerns.  It was a pleasure to see you and your family again today!  Websites that have reliable patient information: 1. American Academy of Asthma, Allergy, and Immunology: www.aaaai.org 2. Food Allergy Research and Education (FARE): foodallergy.org 3. Mothers of Asthmatics: http://www.asthmacommunitynetwork.org 4. American College of Allergy, Asthma, and Immunology: www.acaai.org   COVID-19 Vaccine Information can be found at: ShippingScam.co.uk For questions related to vaccine distribution or appointments, please email [email or web address] or call [phone].     "Like" [place] on Facebook and Instagram for our latest updates!       Make sure you are registered to vote! If you have moved or changed any of your contact information, you will need to get this updated before voting!  In some cases, you MAY be able to register to vote online: [email or web address]

## 2021-07-10 NOTE — Progress Notes (Addendum)
FOLLOW UP  Date of Service/Encounter:  07/10/21   Assessment:   Allergic reaction - with concern for vaccine allergy (testing positive to triamcinolone on the most concentrated intradermal injection) - tolerated her Miralax challenge today and her first dose of Pfizer    Multiple antibiotic allergies   P/SAR - controlled with avoidance measures for the most part   Food sensitizations - without a clear allergic history   Complicated past medical history, including achalasia    Plan/Recommendations:   1. Concern for COVID vaccine reaction - You tolerated your COVID vaccine today. - Touch base with [place] in the fall to see if we have a different vaccine for the other variants.  2. Follow up as needed.    Subjective:   Donna Baldwin is a 80 y.o. female presenting today for follow up of  Chief Complaint  Patient presents with   Allergic Reaction    She has got two vaccines and she I here to get the booster - pfizer  - wanted to make sure hematology test colodine and she took it yesterday at 28 was also taking Flonase yesterday. She is off off all her antihistamines. Took her high blood pressure medication and 2 zyrtec dissolvable tables this morning and ate oatmeal for breakfast.     Donna Baldwin has a history of the following: Patient Active Problem List   Diagnosis Date Noted   Microcytic anemia 04/11/2021   Achalasia 04/18/2020   Abnormal CT of liver 12/20/2019   Coronary artery disease involving native coronary artery of native heart without angina pectoris 12/20/2019   Palpitation 02/08/2019   Nonintractable headache    TIA (transient ischemic attack) 07/06/2018   Hypertensive urgency 07/06/2018   History of diabetes mellitus 04/01/2017   Transient retinal artery occlusion of both eyes 07/07/2014   Hereditary and idiopathic peripheral neuropathy 07/07/2014   Aortic atherosclerosis ([place]) 12/03/2013   Mixed hyperlipidemia 12/03/2013   Essential  hypertension 12/03/2013   GERD (gastroesophageal reflux disease) 03/24/2013   Obstructive sleep apnea, ruled out 02/28/2013   Urticaria due to food allergy 08/31/2012   Thrombocytopenia ([place])     History obtained from: chart review and patient.  Donna Baldwin is a 80 y.o. female presenting for a follow up visit.  She first presented to me in July 2021 due to concern for having an allergic reaction to the COVID-19 vaccine.  She has a history of multiple medication allergies.  On testing to the vaccine components, she was positive to triamcinolone at the nearly full concentration (making the [employer] vaccine risky since it contains polysorbate).  She tolerated her MiraLAX challenge and she received her first dose of Pfizer without a problem.  She has since presented and received her second vaccine without a problem.  She presents today for her booster.  She also noted a concern for chronic granulomatous disease.  Her chest x-ray has been normal.  She also had no history of infections.  Since last visit, she has done very well.  She is very motivated to get this booster in.  Despite being vaccinated, she has remained very isolated.  Her husband is here with her today.  She has done well. She just recently went to the hospital for a hematology appointment. Her blood pressure continues to have issues. She is feeling good today and she took two cetirizine at 10am this morning.   She was diagnosed with sinus bradycardia. She is watching this and it was recommended that she do  her exercises and do her medicines.   Her daughter continues to travel as a Catering manager.  She is in [place] right now.  Otherwise, there have been no changes to her past medical history, surgical history, family history, or social history.    Review of Systems  Constitutional: Negative.  Negative for fever, malaise/fatigue and weight loss.  HENT: Negative.  Negative for congestion, ear discharge and ear pain.    Eyes:  Negative for pain, discharge and redness.  Respiratory:  Negative for cough, sputum production, shortness of breath and wheezing.   Cardiovascular: Negative.  Negative for chest pain and palpitations.  Gastrointestinal:  Negative for abdominal pain and heartburn.  Skin: Negative.  Negative for itching and rash.  Neurological:  Negative for dizziness and headaches.  Endo/Heme/Allergies:  Negative for environmental allergies. Does not bruise/bleed easily.      Objective:   Blood pressure 140/80, pulse (!) 52, temperature 97.6 F (36.4 C), resp. rate 16, height 5\' 3"  (1.6 m), weight 118 lb 3.2 oz (53.6 kg), SpO2 100 %. Body mass index is 20.94 kg/m.   Physical Exam:  Physical Exam Constitutional:      Appearance: She is well-developed.     Comments: Delightful.  Thin appearing.  Talkative.  HENT:     Head: Normocephalic and atraumatic.     Right Ear: Tympanic membrane, ear canal and external ear normal.     Left Ear: Tympanic membrane, ear canal and external ear normal.     Nose: No nasal deformity, septal deviation, mucosal edema or rhinorrhea.     Right Turbinates: Not enlarged or swollen.     Left Turbinates: Not enlarged or swollen.     Right Sinus: No maxillary sinus tenderness or frontal sinus tenderness.     Left Sinus: No maxillary sinus tenderness or frontal sinus tenderness.     Mouth/Throat:     Mouth: Mucous membranes are not pale and not dry.     Pharynx: Uvula midline.  Eyes:     General: Lids are normal. No allergic shiner.       Right eye: No discharge.        Left eye: No discharge.     Conjunctiva/sclera: Conjunctivae normal.     Right eye: Right conjunctiva is not injected. No chemosis.    Left eye: Left conjunctiva is not injected. No chemosis.    Pupils: Pupils are equal, round, and reactive to light.  Cardiovascular:     Rate and Rhythm: Regular rhythm. Bradycardia present.     Heart sounds: Normal heart sounds.  Pulmonary:     Effort:  Pulmonary effort is normal. No tachypnea, accessory muscle usage or respiratory distress.     Breath sounds: Normal breath sounds. No wheezing, rhonchi or rales.  Chest:     Chest wall: No tenderness.  Lymphadenopathy:     Cervical: No cervical adenopathy.  Skin:    Coloration: Skin is not pale.     Findings: No abrasion, erythema, petechiae or rash. Rash is not papular, urticarial or vesicular.     Comments: She does have a residual hyperpigmented lesion on her right forearm.  She says this has been present since we did the skin testing to the vaccine components.  It is flat and does not itch.  Dermatology has not been concerned with it.  Neurological:     Mental Status: She is alert.  Psychiatric:        Behavior: Behavior is cooperative.     Diagnostic  studies:   Patient tolerated her dose of Pfizer in the clinic without a problem. She was monitored for 30 minutes after the injection.      Salvatore Marvel, MD  [facility]

## 2021-07-11 LAB — SARS-COV-2 ANTIBODIES: SARS-CoV-2 Antibodies: NEGATIVE

## 2021-07-12 DIAGNOSIS — D649 Anemia, unspecified: Secondary | ICD-10-CM | POA: Diagnosis not present

## 2021-07-12 DIAGNOSIS — D696 Thrombocytopenia, unspecified: Secondary | ICD-10-CM | POA: Diagnosis not present

## 2021-07-12 DIAGNOSIS — I10 Essential (primary) hypertension: Secondary | ICD-10-CM | POA: Diagnosis not present

## 2021-07-13 ENCOUNTER — Telehealth: Payer: Self-pay | Admitting: Allergy & Immunology

## 2021-07-13 DIAGNOSIS — D696 Thrombocytopenia, unspecified: Secondary | ICD-10-CM | POA: Diagnosis not present

## 2021-07-13 DIAGNOSIS — D649 Anemia, unspecified: Secondary | ICD-10-CM | POA: Diagnosis not present

## 2021-07-13 DIAGNOSIS — I10 Essential (primary) hypertension: Secondary | ICD-10-CM | POA: Diagnosis not present

## 2021-07-13 NOTE — Telephone Encounter (Signed)
Pt received the results thru mychart for the covid antibodies and would like for you to contact her about them

## 2021-07-13 NOTE — Telephone Encounter (Signed)
Called and spoke with the patient and informed her. Advised that we will have Donna Baldwin look into this Monday morning 07/16/21 and give her a call with an update. Patient verbalized understanding.

## 2021-07-13 NOTE — Telephone Encounter (Signed)
Patient would like someone to call her back about her test results from 07/10/21.

## 2021-07-13 NOTE — Telephone Encounter (Signed)
I wrote a MyChart message about it and reordered the correct test.  Salvatore Marvel, MD [facility]

## 2021-07-13 NOTE — Addendum Note (Signed)
Addended by: Valentina Shaggy on: 07/13/2021 04:41 PM   Modules accepted: Orders

## 2021-07-15 DIAGNOSIS — D696 Thrombocytopenia, unspecified: Secondary | ICD-10-CM | POA: Diagnosis not present

## 2021-07-15 DIAGNOSIS — D649 Anemia, unspecified: Secondary | ICD-10-CM | POA: Diagnosis not present

## 2021-07-15 DIAGNOSIS — I10 Essential (primary) hypertension: Secondary | ICD-10-CM | POA: Diagnosis not present

## 2021-07-16 ENCOUNTER — Other Ambulatory Visit: Payer: Self-pay | Admitting: *Deleted

## 2021-07-16 DIAGNOSIS — D509 Iron deficiency anemia, unspecified: Secondary | ICD-10-CM

## 2021-07-16 LAB — OCCULT BLOOD X 1 CARD TO LAB, STOOL
Fecal Occult Bld: NEGATIVE
Fecal Occult Bld: NEGATIVE
Fecal Occult Bld: NEGATIVE

## 2021-07-18 LAB — SARS-COV-2 SPIKE AB DILUTION: SARS-CoV-2 Spike Ab Dilution: 1795 U/mL (ref <0.8)

## 2021-07-18 LAB — SARS-COV-2 SEMI-QUANTITATIVE TOTAL ANTIBODY, SPIKE: SARS-CoV-2 Spike Ab Interp: POSITIVE

## 2021-07-18 LAB — SPECIMEN STATUS REPORT

## 2021-07-18 NOTE — Telephone Encounter (Signed)
Spoke with RaChelle and she was able to add the lab on. Called and informed the patient and advised that we will be in touch when the results are back. Patient verbalized understanding.

## 2021-07-31 ENCOUNTER — Ambulatory Visit: Payer: Medicare HMO | Admitting: Student

## 2021-08-03 DIAGNOSIS — G5793 Unspecified mononeuropathy of bilateral lower limbs: Secondary | ICD-10-CM | POA: Diagnosis not present

## 2021-08-03 DIAGNOSIS — E1169 Type 2 diabetes mellitus with other specified complication: Secondary | ICD-10-CM | POA: Diagnosis not present

## 2021-08-03 DIAGNOSIS — I10 Essential (primary) hypertension: Secondary | ICD-10-CM | POA: Diagnosis not present

## 2021-08-03 DIAGNOSIS — H9313 Tinnitus, bilateral: Secondary | ICD-10-CM | POA: Diagnosis not present

## 2021-08-06 DIAGNOSIS — K220 Achalasia of cardia: Secondary | ICD-10-CM | POA: Diagnosis not present

## 2021-08-07 ENCOUNTER — Ambulatory Visit (INDEPENDENT_AMBULATORY_CARE_PROVIDER_SITE_OTHER): Payer: Medicare HMO | Admitting: Neurology

## 2021-08-07 ENCOUNTER — Encounter: Payer: Self-pay | Admitting: Neurology

## 2021-08-07 ENCOUNTER — Other Ambulatory Visit: Payer: Self-pay

## 2021-08-07 VITALS — BP 142/63 | HR 44 | Ht 62.5 in | Wt 116.0 lb

## 2021-08-07 DIAGNOSIS — G4719 Other hypersomnia: Secondary | ICD-10-CM | POA: Diagnosis not present

## 2021-08-07 DIAGNOSIS — G4722 Circadian rhythm sleep disorder, advanced sleep phase type: Secondary | ICD-10-CM | POA: Diagnosis not present

## 2021-08-07 DIAGNOSIS — R0683 Snoring: Secondary | ICD-10-CM

## 2021-08-07 DIAGNOSIS — R001 Bradycardia, unspecified: Secondary | ICD-10-CM | POA: Diagnosis not present

## 2021-08-07 DIAGNOSIS — I10 Essential (primary) hypertension: Secondary | ICD-10-CM

## 2021-08-07 DIAGNOSIS — R002 Palpitations: Secondary | ICD-10-CM | POA: Insufficient documentation

## 2021-08-07 MED ORDER — TRAZODONE HCL 50 MG PO TABS: 25.0000 mg | ORAL_TABLET | Freq: Every day | ORAL | 3 refills | Status: DC

## 2021-08-07 NOTE — Patient Instructions (Signed)
Trazodone Tablets What is this medication? TRAZODONE (TRAZ oh done) treats depression. It increases the amount ofserotonin in the brain, a hormone that helps regulate mood. This medicine may be used for other purposes; ask your health care provider orpharmacist if you have questions. COMMON BRAND NAME(S): Desyrel What should I tell my care team before I take this medication? They need to know if you have any of these conditions: Attempted suicide or thinking about it Bipolar disorder Bleeding problems Glaucoma Heart disease, or previous heart attack Irregular heart beat Kidney or liver disease Low levels of sodium in the blood An unusual or allergic reaction to trazodone, other medications, foods, dyes or preservatives Pregnant or trying to get pregnant Breast-feeding How should I use this medication? Take this medication by mouth with a glass of water. Follow the directions on the prescription label. Take this medication shortly after a meal or a light snack. Take your medication at regular intervals. Do not take your medication more often than directed. Do not stop taking this medication suddenly except upon the advice of your care team. Stopping this medication too quickly maycause serious side effects or your condition may worsen. A special MedGuide will be given to you by the pharmacist with eachprescription and refill. Be sure to read this information carefully each time. Talk to your care team regarding the use of this medication in children.Special care may be needed. Overdosage: If you think you have taken too much of this medicine contact apoison control center or emergency room at once. NOTE: This medicine is only for you. Do not share this medicine with others. What if I miss a dose? If you miss a dose, take it as soon as you can. If it is almost time for yournext dose, take only that dose. Do not take double or extra doses. What may interact with this medication? Do not take  this medication with any of the following: Certain medications for fungal infections like fluconazole, itraconazole, ketoconazole, posaconazole, voriconazole Cisapride Dronedarone Linezolid MAOIs like Carbex, Eldepryl, Marplan, Nardil, and Parnate Mesoridazine Methylene blue (injected into a vein) Pimozide Saquinavir Thioridazine This medication may also interact with the following: Alcohol Antiviral medications for HIV or AIDS Aspirin and aspirin-like medications Barbiturates like phenobarbital Certain medications for blood pressure, heart disease, irregular heart beat Certain medications for depression, anxiety, or psychotic disturbances Certain medications for migraine headache like almotriptan, eletriptan, frovatriptan, naratriptan, rizatriptan, sumatriptan, zolmitriptan Certain medications for seizures like carbamazepine and phenytoin Certain medications for sleep Certain medications that treat or prevent blood clots like dalteparin, enoxaparin, warfarin Digoxin Fentanyl Lithium NSAIDS, medications for pain and inflammation, like ibuprofen or naproxen Other medications that prolong the QT interval (cause an abnormal heart rhythm) like dofetilide Rasagiline Supplements like St. John's wort, kava kava, valerian Tramadol Tryptophan This list may not describe all possible interactions. Give your health care provider a list of all the medicines, herbs, non-prescription drugs, or dietary supplements you use. Also tell them if you smoke, drink alcohol, or use illegaldrugs. Some items may interact with your medicine. What should I watch for while using this medication? Tell your care team if your symptoms do not get better or if they get worse. Visit your care team for regular checks on your progress. Because it may take several weeks to see the full effects of this medication, it is important tocontinue your treatment as prescribed by your care team. Watch for new or worsening  thoughts of suicide or depression. This includes sudden changes in mood,  behaviors, or thoughts. These changes can happen at any time but are more common in the beginning of treatment or after a change in dose. Call your care team right away if you experience these thoughts orworsening depression. Manic episodes may happen in patients with bipolar disorder who take this medication. Watch for changes in feelings or behaviors such as feeling anxious, nervous, agitated, panicky, irritable, hostile, aggressive, impulsive, severely restless, overly excited and hyperactive, or trouble sleeping. These changes can happen at any time but are more common in the beginning of treatment or after a change in dose. Call your care team right away if you notice any ofthese symptoms. You may get drowsy or dizzy. Do not drive, use machinery, or do anything that needs mental alertness until you know how this medication affects you. Do not stand or sit up quickly, especially if you are an older patient. This reduces the risk of dizzy or fainting spells. Alcohol may interfere with the effect ofthis medication. Avoid alcoholic drinks. This medication may cause dry eyes and blurred vision. If you wear contact lenses you may feel some discomfort. Lubricating drops may help. See your eyedoctor if the problem does not go away or is severe. Your mouth may get dry. Chewing sugarless gum, sucking hard candy and drinking plenty of water may help. Contact your care team if the problem does not goaway or is severe. What side effects may I notice from receiving this medication? Side effects that you should report to your care team as soon as possible: Allergic reactions-skin rash, itching, hives, swelling of the face, lips, tongue, or throat Bleeding-bloody or black, tar-like stools, red or dark brown urine, vomiting blood or brown material that looks like coffee grounds, small, red or purple spots on skin, unusual bleeding or  bruising Heart rhythm changes-fast or irregular heartbeat, dizziness, feeling faint or lightheaded, chest pain, trouble breathing Low blood pressure-dizziness, feeling faint or lightheaded, blurry vision Low sodium level-muscle weakness, fatigue, dizziness, headache, confusion Prolonged or painful erection Serotonin syndrome-irritability, confusion, fast or irregular heartbeat, muscle stiffness, twitching muscles, sweating, high fever, seizures, chills, vomiting, diarrhea Sudden eye pain or change in vision such as blurry vision, seeing halos around lights, vision loss Thoughts of suicide or self-harm, worsening mood, feelings of depression Side effects that usually do not require medical attention (report to your careteam if they continue or are bothersome): Change in sex drive or performance Constipation Dizziness Drowsiness Dry mouth This list may not describe all possible side effects. Call your doctor for medical advice about side effects. You may report side effects to FDA at1-800-FDA-1088. Where should I keep my medication? Keep out of the reach of children and pets. Store at room temperature between 15 and 30 degrees C (59 to 86 degrees F). Protect from light. Keep container tightly closed. Throw away any unusedmedication after the expiration date. NOTE: This sheet is a summary. It may not cover all possible information. If you have questions about this medicine, talk to your doctor, pharmacist, orhealth care provider.  2022 Elsevier/Gold Standard (2020-11-06 14:46:11) Insomnia Insomnia is a sleep disorder that makes it difficult to fall asleep or stay asleep. Insomnia can cause fatigue, low energy, difficulty concentrating, moodswings, and poor performance at work or school. There are three different ways to classify insomnia: Difficulty falling asleep. Difficulty staying asleep. Waking up too early in the morning. Any type of insomnia can be long-term (chronic) or short-term  (acute). Both are common. Short-term insomnia usually lasts for three months or  less. Chronic insomnia occurs at least three times a week for longer than threemonths. What are the causes? Insomnia may be caused by another condition, situation, or substance, such as: Anxiety. Certain medicines. Gastroesophageal reflux disease (GERD) or other gastrointestinal conditions. Asthma or other breathing conditions. Restless legs syndrome, sleep apnea, or other sleep disorders. Chronic pain. Menopause. Stroke. Abuse of alcohol, tobacco, or illegal drugs. Mental health conditions, such as depression. Caffeine. Neurological disorders, such as Alzheimer's disease. An overactive thyroid (hyperthyroidism). Sometimes, the cause of insomnia may not be known. What increases the risk? Risk factors for insomnia include: Gender. Women are affected more often than men. Age. Insomnia is more common as you get older. Stress. Lack of exercise. Irregular work schedule or working night shifts. Traveling between different time zones. Certain medical and mental health conditions. What are the signs or symptoms? If you have insomnia, the main symptom is having trouble falling asleep or having trouble staying asleep. This may lead to other symptoms, such as: Feeling fatigued or having low energy. Feeling nervous about going to sleep. Not feeling rested in the morning. Having trouble concentrating. Feeling irritable, anxious, or depressed. How is this diagnosed? This condition may be diagnosed based on: Your symptoms and medical history. Your health care provider may ask about: Your sleep habits. Any medical conditions you have. Your mental health. A physical exam. How is this treated? Treatment for insomnia depends on the cause. Treatment may focus on treating an underlying condition that is causing insomnia. Treatment may also include: Medicines to help you sleep. Counseling or therapy. Lifestyle  adjustments to help you sleep better. Follow these instructions at home: Eating and drinking  Limit or avoid alcohol, caffeinated beverages, and cigarettes, especially close to bedtime. These can disrupt your sleep. Do not eat a large meal or eat spicy foods right before bedtime. This can lead to digestive discomfort that can make it hard for you to sleep.  Sleep habits  Keep a sleep diary to help you and your health care provider figure out what could be causing your insomnia. Write down: When you sleep. When you wake up during the night. How well you sleep. How rested you feel the next day. Any side effects of medicines you are taking. What you eat and drink. Make your bedroom a dark, comfortable place where it is easy to fall asleep. Put up shades or blackout curtains to block light from outside. Use a white noise machine to block noise. Keep the temperature cool. Limit screen use before bedtime. This includes: Watching TV. Using your smartphone, tablet, or computer. Stick to a routine that includes going to bed and waking up at the same times every day and night. This can help you fall asleep faster. Consider making a quiet activity, such as reading, part of your nighttime routine. Try to avoid taking naps during the day so that you sleep better at night. Get out of bed if you are still awake after 15 minutes of trying to sleep. Keep the lights down, but try reading or doing a quiet activity. When you feel sleepy, go back to bed.  General instructions Take over-the-counter and prescription medicines only as told by your health care provider. Exercise regularly, as told by your health care provider. Avoid exercise starting several hours before bedtime. Use relaxation techniques to manage stress. Ask your health care provider to suggest some techniques that may work well for you. These may include: Breathing exercises. Routines to release muscle tension. Visualizing peaceful  scenes. Make sure that you drive carefully. Avoid driving if you feel very sleepy. Keep all follow-up visits as told by your health care provider. This is important. Contact a health care provider if: You are tired throughout the day. You have trouble in your daily routine due to sleepiness. You continue to have sleep problems, or your sleep problems get worse. Get help right away if: You have serious thoughts about hurting yourself or someone else. If you ever feel like you may hurt yourself or others, or have thoughts about taking your own life, get help right away. You can go to your nearest emergency department or call: Your local emergency services (911 in the [place] A suicide crisis helpline, such as the [facility] at 612 415 7774. This is open 24 hours a day. Summary Insomnia is a sleep disorder that makes it difficult to fall asleep or stay asleep. Insomnia can be long-term (chronic) or short-term (acute). Treatment for insomnia depends on the cause. Treatment may focus on treating an underlying condition that is causing insomnia. Keep a sleep diary to help you and your health care provider figure out what could be causing your insomnia. This information is not intended to replace advice given to you by your health care provider. Make sure you discuss any questions you have with your healthcare provider. Document Revised: 10/26/2020 Document Reviewed: 10/26/2020 Elsevier Patient Education  2022 [employer].

## 2021-08-07 NOTE — Progress Notes (Signed)
SLEEP MEDICINE CLINIC    Provider:  Larey Seat, MD  Primary Care Physician:  Harlan Stains, MD [place] [address]     Referring Provider: Alethia Berthold, [place],  [place] [address]          Chief Complaint according to patient   Patient presents with:     New Patient (Initial Visit)           HISTORY OF PRESENT ILLNESS:  Donna Baldwin is a 80 y.o.  Black or [place] American female patient seen here upon cardiology referral on 08/07/2021 .  Chief concern according to patient : Donna Baldwin is seen here today upon referral of her cardiology practice [facility].  Donna Cruel, PA is her referring provider.  The patient struggles with unexplained high blood pressures and the question of possibly being caused by obstructive sleep apnea was raised.  She has a history of polyneuropathy attributed to diabetes, ocular migraines and a positive antinuclear antibody and has followed Dr. Metta Clines in 2018 as a neurologist.  She had a previous sleep study on this foot on the March 16, 2013 at the [facility] associated sleep lab interpreted by Dr. Annamaria Boots.  At the time she had no apnea 0.7/h was her AHI but she did not snore and her respiratory disturbance index was 7.4/h the problem was that the patient could not enter and maintain sleep and her total sleep time was 117 minutes.  She is also on medication to keep her heart rate controlled but this has caused her to have bradycardia, this medication was initiated to treat palpitation and hypertension and she does have some lower extremity edema.  She has type 2 diabetes mellitus chronic dysphagia and interstitial cystitis as well.  The last visit with cardiology was on June 05, 2021 and she stated that she has episodes of palpitation associated with shortness of breath and these occur at rest not when she is exerting herself.  She has also an increased  level of fatigue but denies any chest pain she did not feel like she would faint.  She is taking Bystolic, Zyrtec, iron supplement, ibuprofen as needed lorazepam as needed and vitamin B6 supplement..  The patient has sinus bradycardia at a rate of 49 bpm as recorded in her cardiology office left atrial enlargement was noted but normal axis incomplete right bundle branch block.  An EKG in September last year showed bradycardia at 44 bpm  A CT angiogram in December 2020 showed a calcium score 408 in the 54th percentile for age and gender.  She already wore an event monitor January through February 2020 which only showed left sinus bradycardia with the lowest heart rate being 32 bpm.  And occasional PVCs were noted even.   Donna Baldwin presents to be evaluated for a possible sleep disorder.  She has "chronic insomnia" but really has early sleep phase syndrome, going to bed at 5-7 PM. Sleeps until midnight, then again sleeps from 4-6 AM.    has a past medical history of Acid reflux, Anemia, Anxiety, Atherosclerosis, Back pain, Coronary artery disease, GERD (gastroesophageal reflux disease), HTN (hypertension) (12/03/2013), Hyperlipidemia, Hypertension, Hypertensive heart disease without heart failure, Thrombocytopenia ([place]), and Vitamin D deficiency..   Sleep relevant medical history: early sleepiness, sleep onset- Nocturia none-, Tonsillectomy at age 30,  sinusitis, tinnitus aureus, cervical spine DDD , deviated septum . Family medical /sleep history:  no other family member on CPAP with OSA, insomnia, sleep walkers.    Social history:  Patient is retired from teaching, [employer] 2 second grade.  She lives in a household with spouse, persons/ alone. Married with 2 children, 3 grandchildren.  Tobacco use: never .  ETOH use : never ,  Caffeine intake in form of Coffee( /) Soda( /) Tea ( decaffeinated ) or energy drinks. Regular exercise in form of walk.   Hobbies :reading.       Sleep habits are as  follows: The patient's dinner time is between 1-2 PM. The patient goes to bed at 5 PM and continues to sleep for 5-7 hours, waking close to midnight.  The preferred sleep position is left supine-, with the support of 2 pillows. Flat bed. Dreams are reportedly  frequent/vivid. Nightmares, physical reaction, palpitations, sweating.  12-2  AM is the usual rise time. The patient wakes up spontaneously.  / She reports not feeling refreshed or restored in AM,  Naps are taken  daily from 4-5 Am to 6-7 AM.   Review of Systems: Out of a complete 14 system review, the patient complains of only the following symptoms, and all other reviewed systems are negative.:   Achalasia. Fatigue, sleepiness , snoring, 'INSOMNIA' but really early sleep phase , circadian rhythm.    How likely are you to doze in the following situations: 0 = not likely, 1 = slight chance, 2 = moderate chance, 3 = high chance   Sitting and Reading? Watching Television? Sitting inactive in a public place (theater or meeting)? As a passenger in a car for an hour without a break? Lying down in the afternoon when circumstances permit? Sitting and talking to someone? Sitting quietly after lunch without alcohol? In a car, while stopped for a few minutes in traffic?   Total = 17/ 24 points   FSS endorsed at 51/ 63 points.   Social History   Socioeconomic History   Marital status: Married    Spouse name: Not on file   Number of children: 2   Years of education: 14   Highest education level: Not on file  Occupational History   Not on file  Tobacco Use   Smoking status: Never   Smokeless tobacco: Never  Vaping Use   Vaping Use: Never used  Substance and Sexual Activity   Alcohol use: No    Alcohol/week: 0.0 standard drinks   Drug use: No   Sexual activity: Never  Other Topics Concern   Not on file  Social History Narrative   Right handed   Some tea  (smoothie tea for bowel movement once a week)   Lives w/ husband    Social Determinants of Health   Financial Resource Strain: Not on file  Food Insecurity: Not on file  Transportation Needs: Not on file  Physical Activity: Not on file  Stress: Not on file  Social Connections: Not on file    Family History  Problem Relation Age of Onset   Cancer Father    Allergic rhinitis Neg Hx    Angioedema Neg Hx    Asthma Neg Hx    Atopy Neg Hx    Eczema Neg Hx    Immunodeficiency Neg Hx    Urticaria Neg Hx     Past Medical History:  Diagnosis Date   Acid reflux    Anemia    Anxiety    Atherosclerosis    Back pain    Coronary artery disease  GERD (gastroesophageal reflux disease)    HTN (hypertension) 12/03/2013   CT scan-no pheochromocytoma   Hyperlipidemia    Hypertension    Hypertensive heart disease without heart failure    Thrombocytopenia (HCC)    Vitamin D deficiency     Past Surgical History:  Procedure Laterality Date   ESOPHAGEAL MANOMETRY N/A 03/15/2020   Procedure: ESOPHAGEAL MANOMETRY (EM);  Surgeon: Wilford Corner, MD;  Location: [facility];  Service: Endoscopy;  Laterality: N/A;   EYE SURGERY     HELLER MYOTOMY N/A 04/18/2020   Procedure: LAPAROSCOPIC HELLER MYOTOMY WITH UPPER ENDOSCOPY;  Surgeon: Kinsinger, Arta Bruce, MD;  Location: [facility];  Service: General;  Laterality: N/A;   [place] IMPEDANCE STUDY N/A 03/15/2020   Procedure: [place] IMPEDANCE STUDY;  Surgeon: Wilford Corner, MD;  Location: [facility];  Service: Endoscopy;  Laterality: N/A;   SHOULDER SURGERY Bilateral    TONSILLECTOMY       Current Outpatient Medications on File Prior to Visit  Medication Sig Dispense Refill   amLODipine (NORVASC) 2.5 MG tablet Take 3 tablets (7.5 mg total) by mouth daily.     BYSTOLIC 10 MG tablet TAKE 1 TABLET BY MOUTH EVERY DAY 90 tablet 0   Calcium Carbonate Antacid (TUMS CHEWY BITES PO) Take 1 tablet by mouth daily as needed (acid reflux).     cetirizine (ZYRTEC) 10 MG tablet Take 10 mg by mouth daily as needed for  allergies.      EPINEPHrine 0.3 mg/0.3 mL IJ SOAJ injection Inject 0.3 mg into the muscle once as needed (for anaphylaxis).  1   fluticasone (FLONASE) 50 MCG/ACT nasal spray Place into both nostrils.     ibuprofen (ADVIL) 600 MG tablet Take 1 tablet (600 mg total) by mouth every 6 (six) hours as needed. 30 tablet 0   LORazepam (ATIVAN) 0.5 MG tablet Take 0.5 mg by mouth daily as needed for anxiety.     mometasone (ELOCON) 0.1 % cream Apply 1 application topically daily.     Pyridoxine HCl (VITAMIN B6) 50 MG TABS 1 tablet     No current facility-administered medications on file prior to visit.    Allergies  Allergen Reactions   Acetaminophen Other (See Comments)    Tremor, anxiety   Beef Allergy Anaphylaxis   Beef-Derived Products Anaphylaxis   Codeine Hives and Anxiety   Hydralazine Hives and Shortness Of Breath   Iodine Anaphylaxis   Ivp Dye [Iodinated Diagnostic Agents] Anaphylaxis and Other (See Comments)   Latex Rash   Morphine And Related Other (See Comments)    Tremors, increased heart rate, excitement, confusion per pt   Penicillins Hives    Has patient had a PCN reaction causing immediate rash, facial/tongue/throat swelling, SOB or lightheadedness with hypotension: Yes Has patient had a PCN reaction causing severe rash involving mucus membranes or skin necrosis: Yes Has patient had a PCN reaction that required hospitalization: Yes Has patient had a PCN reaction occurring within the last 10 years: No If all of the above answers are "NO", then may proceed with Cephalosporin use.    Sertraline Other (See Comments)    Numbness in mouth and hyperactivity   Shellfish Allergy Hives   Solu-Medrol [Methylprednisolone] Other (See Comments)    Numbness and tingling in mouth   Sulfa Antibiotics Hives and Rash   Barbiturates Other (See Comments)    Excitement    Bee Pollen Hives   Bee Venom Hives   Diovan [Valsartan] Nausea Only   Gabapentin Other (See Comments)  Visual  disturbance   Livalo [Pitavastatin] Swelling and Other (See Comments)    Facial swelling   Other Palpitations, Other (See Comments) and Rash    NARCOTIC ANALGESICS-TREMORS, INCREASED HEART RATE Hmg-Coa Reductase Inhibitors - intolerance unknown    Oxycodone Swelling, Rash and Cough   Promethazine-Dm Other (See Comments)    "Felt funny"   Tape Rash and Other (See Comments)    Red blotches    Wellness Essentials Blood Sugr [Nutritional Supplements] Other (See Comments)    Nuts, strawberries, bicarbonate of soda,   Albuterol Other (See Comments)    Caused wheezing   Ciprofloxacin Other (See Comments)   Crestor [Rosuvastatin] Other (See Comments)   Famotidine Other (See Comments)    Unknown reaction   Pred Forte [Prednisolone Acetate] Other (See Comments)    Patient is seeing odd color and images while using this   Welchol [Colesevelam]     Other reaction(s): myalgias   Celebrex [Celecoxib] Swelling and Other (See Comments)    Swelling and numbness (mouth)   Lanolin Itching   Mobic [Meloxicam] Swelling and Other (See Comments)    Swelling and numbness in mouth   Nickel Rash   Nitrates, Organic Rash and Other (See Comments)    Chest tightness also   Petrolatum Rash and Other (See Comments)   Soy Allergy Rash   Strawberry Extract Rash    Physical exam:  Today's Vitals   08/07/21 0959  BP: (!) 142/63  Pulse: (!) 44  SpO2: 98%  Weight: 116 lb (52.6 kg)  Height: 5' 2.5" (1.588 m)   Body mass index is 20.88 kg/m.   Wt Readings from Last 3 Encounters:  08/07/21 116 lb (52.6 kg)  07/10/21 118 lb 3.2 oz (53.6 kg)  07/09/21 117 lb 8 oz (53.3 kg)     Ht Readings from Last 3 Encounters:  08/07/21 5' 2.5" (1.588 m)  07/10/21 '5\' 3"'$  (1.6 m)  07/09/21 '5\' 3"'$  (1.6 m)      General: The patient is awake, alert and appears not in acute distress.  The patient is well groomed. Head: Normocephalic, atraumatic. Neck is supple. Mallampati 2,  neck circumference:12 inches . Nasal  airflow  patent.  Retrognathia is not seen.  Dental status: implants Cardiovascular:  Regular rate and cardiac rhythm by pulse,  without distended neck veins. Respiratory: Lungs are clear to auscultation.  Skin:  Without evidence of ankle edema, or rash. Trunk: The patient's posture is erect.   Neurologic exam : The patient is awake and alert, oriented to place and time.   Memory subjective described as intact.  Attention span & concentration ability appears normal.  Speech is fluent,  without dysarthria, dysphonia or aphasia.  Mood and affect are appropriate.   Cranial nerves: no loss of smell or taste reported  Pupils are equal and briskly reactive to light. Funduscopic exam deferred. .  Extraocular movements in vertical and horizontal planes were intact and without nystagmus. No Diplopia. Visual fields by finger perimetry are intact. Hearing was intact to soft voice and finger rubbing.    Facial sensation intact to fine touch.  Facial motor strength is symmetric and tongue and uvula move midline.  Neck ROM : rotation, tilt and flexion extension were normal for age and shoulder shrug was symmetrical. ROM for shoulder is bilaterally reduced.   Motor exam:  Symmetric bulk, tone and ROM.  patient is very slender and has reduced muscle mass. Lost weight.  Normal tone without cog- wheeling, symmetric grip strength .  Sensory:  Fine touch and vibration were reduced at both ankles.  Proprioception tested in the upper extremities was normal.   Coordination: Rapid alternating movements in the fingers/hands were of normal speed.  The Finger-to-nose maneuver was intact without evidence of ataxia, dysmetria or tremor.   Gait and station: Patient could rise unassisted from a seated position, walked without assistive device.  Stance is of normal width/ base and the patient turned with 3 steps.  Toe and heel walk were deferred.  Deep tendon reflexes: in the upper and lower extremities are  symmetric - her upper extremity reflexes were trace only.  Babinski response was deferred.       After spending a total time of  45  minutes face to face and additional time for physical and neurologic examination, review of laboratory studies,  personal review of imaging studies, reports and results of other testing and review of referral information / records as far as provided in visit, I have established the following assessments:  1)  BP and heart rate low, the patient may well have developed obstructive sleep apnea or central sleep apnea since he was last tested. A phaeochromocytoma was ruled out. I don't know if a renal artery stenosis was ever considered?   My goal is to repeat a test I would prefer an attended sleep study in the sleep lab if her insurance will not allow it we will instead use a home sleep test device.   My goal is also to see her heart rate and rhythm during different sleep stages.   Circadian rhythm:  I will prescribe trazodone as a sleep aid to help her fall asleep in the sleep lab if this should be a problem so she can use it as needed.   I would like for her to try to push out her bedtime to 6 or 7 PM at home if possible her overall sleep time is sufficient at least numerically.    Failure to thrive. Her BMI currently is quite low at 20kg/m and she has significant muscle mass atrophy.  She has supplemented her diet with protein shakes    My Plan is to proceed with:  1) attended sleep study, PLAN B is HST.    I would like to thank Harlan Stains, MD , Dr Loretta Plume, and Alethia Berthold, [place],  [place] [address] for allowing me to meet with and to take care of this pleasant patient.   Donna Baldwin is presenting with early sleep phase syndrome, snoring, achalasia, bradycardia, a symptom that can be attributed to UARS or OSA,.   I plan to follow up either personally or through our NP within 2-4  month.    Electronically  signed by: Larey Seat, MD 08/07/2021 10:32 AM  [facility] Board certified by The [employer] of Sleep Medicine and Diplomate of the [employer] of Sleep Medicine. Board certified In Neurology through the [place], Fellow of the [employer] of Neurology. Medical Director of [employer].

## 2021-08-11 DIAGNOSIS — H9313 Tinnitus, bilateral: Secondary | ICD-10-CM | POA: Diagnosis not present

## 2021-08-11 DIAGNOSIS — R519 Headache, unspecified: Secondary | ICD-10-CM | POA: Diagnosis not present

## 2021-08-24 DIAGNOSIS — H903 Sensorineural hearing loss, bilateral: Secondary | ICD-10-CM | POA: Diagnosis not present

## 2021-08-24 DIAGNOSIS — H9313 Tinnitus, bilateral: Secondary | ICD-10-CM | POA: Diagnosis not present

## 2021-09-04 DIAGNOSIS — R6339 Other feeding difficulties: Secondary | ICD-10-CM | POA: Diagnosis not present

## 2021-09-04 DIAGNOSIS — R1310 Dysphagia, unspecified: Secondary | ICD-10-CM | POA: Diagnosis not present

## 2021-10-04 ENCOUNTER — Ambulatory Visit (INDEPENDENT_AMBULATORY_CARE_PROVIDER_SITE_OTHER): Payer: Medicare HMO | Admitting: Neurology

## 2021-10-04 ENCOUNTER — Other Ambulatory Visit: Payer: Self-pay

## 2021-10-04 DIAGNOSIS — R001 Bradycardia, unspecified: Secondary | ICD-10-CM

## 2021-10-04 DIAGNOSIS — R0683 Snoring: Secondary | ICD-10-CM

## 2021-10-04 DIAGNOSIS — G4733 Obstructive sleep apnea (adult) (pediatric): Secondary | ICD-10-CM

## 2021-10-04 DIAGNOSIS — G4722 Circadian rhythm sleep disorder, advanced sleep phase type: Secondary | ICD-10-CM

## 2021-10-04 DIAGNOSIS — I10 Essential (primary) hypertension: Secondary | ICD-10-CM

## 2021-10-08 ENCOUNTER — Inpatient Hospital Stay: Payer: Medicare HMO | Attending: Physician Assistant

## 2021-10-08 ENCOUNTER — Inpatient Hospital Stay (HOSPITAL_BASED_OUTPATIENT_CLINIC_OR_DEPARTMENT_OTHER): Payer: Medicare HMO | Admitting: Internal Medicine

## 2021-10-08 ENCOUNTER — Telehealth: Payer: Self-pay | Admitting: Internal Medicine

## 2021-10-08 ENCOUNTER — Other Ambulatory Visit: Payer: Self-pay

## 2021-10-08 VITALS — BP 178/64 | HR 51 | Temp 97.1°F | Resp 18 | Ht 62.5 in | Wt 116.9 lb

## 2021-10-08 DIAGNOSIS — D509 Iron deficiency anemia, unspecified: Secondary | ICD-10-CM | POA: Insufficient documentation

## 2021-10-08 DIAGNOSIS — D696 Thrombocytopenia, unspecified: Secondary | ICD-10-CM

## 2021-10-08 LAB — CBC WITH DIFFERENTIAL (CANCER CENTER ONLY)
Abs Immature Granulocytes: 0 10*3/uL (ref 0.00–0.07)
Basophils Absolute: 0 10*3/uL (ref 0.0–0.1)
Basophils Relative: 1 %
Eosinophils Absolute: 0.1 10*3/uL (ref 0.0–0.5)
Eosinophils Relative: 2 %
HCT: 38.8 % (ref 36.0–46.0)
Hemoglobin: 11.8 g/dL — ABNORMAL LOW (ref 12.0–15.0)
Immature Granulocytes: 0 %
Lymphocytes Relative: 35 %
Lymphs Abs: 1.5 10*3/uL (ref 0.7–4.0)
MCH: 22.7 pg — ABNORMAL LOW (ref 26.0–34.0)
MCHC: 30.4 g/dL (ref 30.0–36.0)
MCV: 74.8 fL — ABNORMAL LOW (ref 80.0–100.0)
Monocytes Absolute: 0.4 10*3/uL (ref 0.1–1.0)
Monocytes Relative: 9 %
Neutro Abs: 2.4 10*3/uL (ref 1.7–7.7)
Neutrophils Relative %: 53 %
Platelet Count: 139 10*3/uL — ABNORMAL LOW (ref 150–400)
RBC: 5.19 MIL/uL — ABNORMAL HIGH (ref 3.87–5.11)
RDW: 15.4 % (ref 11.5–15.5)
WBC Count: 4.4 10*3/uL (ref 4.0–10.5)
nRBC: 0 % (ref 0.0–0.2)

## 2021-10-08 LAB — IRON AND TIBC
Iron: 83 ug/dL (ref 41–142)
Saturation Ratios: 27 % (ref 21–57)
TIBC: 308 ug/dL (ref 236–444)
UIBC: 225 ug/dL (ref 120–384)

## 2021-10-08 LAB — FERRITIN: Ferritin: 139 ng/mL (ref 11–307)

## 2021-10-08 NOTE — Progress Notes (Signed)
[facility] [phone]   [phone]  OFFICE PROGRESS NOTE  Harlan Stains, MD [place] [address]  DIAGNOSIS:  1) Thrombocytopenia likely ITP. 2) microcytic anemia of unclear etiology.   PRIOR THERAPY: None  CURRENT THERAPY: Observation  INTERVAL HISTORY: Donna Baldwin 80 y.o. female returns to the clinic today for follow-up visit.  She was accompanied by her husband.  The patient is feeling fine today with no concerning complaints.  She denied having any fatigue or weakness.  She denied having any nausea, vomiting, diarrhea but has occasional constipation and she is using stool softener.  She denied having any chest pain, shortness of breath, cough or hemoptysis.  She has no bleeding, bruises or ecchymosis.  The patient continues to have uncontrolled hypertension and she is followed by her primary care physician as well as cardiologist.  She is currently on several blood pressure medication.  She is here today for evaluation and repeat blood work.   MEDICAL HISTORY: Past Medical History:  Diagnosis Date   Acid reflux    Anemia    Anxiety    Atherosclerosis    Back pain    Coronary artery disease    GERD (gastroesophageal reflux disease)    HTN (hypertension) 12/03/2013   CT scan-no pheochromocytoma   Hyperlipidemia    Hypertension    Hypertensive heart disease without heart failure    Thrombocytopenia (HCC)    Vitamin D deficiency     ALLERGIES:  is allergic to acetaminophen; beef allergy; beef-derived products; codeine; hydralazine; iodine; ivp dye [iodinated diagnostic agents]; latex; morphine and related; penicillins; sertraline; shellfish allergy; solu-medrol [methylprednisolone]; sulfa antibiotics; barbiturates; bee pollen; bee venom; diovan [valsartan]; gabapentin; livalo [pitavastatin]; other; oxycodone; promethazine-dm; tape; wellness essentials blood sugr [nutritional supplements];  albuterol; ciprofloxacin; crestor [rosuvastatin]; famotidine; pred forte [prednisolone acetate]; welchol [colesevelam]; celebrex [celecoxib]; lanolin; mobic [meloxicam]; nickel; nitrates, organic; petrolatum; soy allergy; and strawberry extract.  MEDICATIONS:  Current Outpatient Medications  Medication Sig Dispense Refill   amLODipine (NORVASC) 2.5 MG tablet Take 3 tablets (7.5 mg total) by mouth daily.     BYSTOLIC 10 MG tablet TAKE 1 TABLET BY MOUTH EVERY DAY 90 tablet 0   Calcium Carbonate Antacid (TUMS CHEWY BITES PO) Take 1 tablet by mouth daily as needed (acid reflux).     cetirizine (ZYRTEC) 10 MG tablet Take 10 mg by mouth daily as needed for allergies.      EPINEPHrine 0.3 mg/0.3 mL IJ SOAJ injection Inject 0.3 mg into the muscle once as needed (for anaphylaxis).  1   fluticasone (FLONASE) 50 MCG/ACT nasal spray Place into both nostrils.     ibuprofen (ADVIL) 600 MG tablet Take 1 tablet (600 mg total) by mouth every 6 (six) hours as needed. 30 tablet 0   LORazepam (ATIVAN) 0.5 MG tablet Take 0.5 mg by mouth daily as needed for anxiety.     mometasone (ELOCON) 0.1 % cream Apply 1 application topically daily.     Pyridoxine HCl (VITAMIN B6) 50 MG TABS 1 tablet     traZODone (DESYREL) 50 MG tablet Take 0.5 tablets (25 mg total) by mouth at bedtime. 45 tablet 3   No current facility-administered medications for this visit.    SURGICAL HISTORY:  Past Surgical History:  Procedure Laterality Date   ESOPHAGEAL MANOMETRY N/A 03/15/2020   Procedure: ESOPHAGEAL MANOMETRY (EM);  Surgeon: Wilford Corner, MD;  Location: [facility];  Service: Endoscopy;  Laterality: N/A;  EYE SURGERY     HELLER MYOTOMY N/A 04/18/2020   Procedure: LAPAROSCOPIC HELLER MYOTOMY WITH UPPER ENDOSCOPY;  Surgeon: Kieth Brightly, Arta Bruce, MD;  Location: [facility];  Service: General;  Laterality: N/A;   [place] IMPEDANCE STUDY N/A 03/15/2020   Procedure: [place] IMPEDANCE STUDY;  Surgeon: Wilford Corner, MD;  Location: [facility];  Service: Endoscopy;  Laterality: N/A;   SHOULDER SURGERY Bilateral    TONSILLECTOMY      REVIEW OF SYSTEMS:  A comprehensive review of systems was negative.   PHYSICAL EXAMINATION: General appearance: alert, cooperative, and no distress Head: Normocephalic, without obvious abnormality, atraumatic Neck: no adenopathy, no JVD, supple, symmetrical, trachea midline, and thyroid not enlarged, symmetric, no tenderness/mass/nodules Lymph nodes: Cervical, supraclavicular, and axillary nodes normal. Resp: clear to auscultation bilaterally Back: symmetric, no curvature. ROM normal. No CVA tenderness. Cardio: regular rate and rhythm, S1, S2 normal, no murmur, click, rub or gallop GI: soft, non-tender; bowel sounds normal; no masses,  no organomegaly Extremities: extremities normal, atraumatic, no cyanosis or edema  ECOG PERFORMANCE STATUS: 1 - Symptomatic but completely ambulatory  Blood pressure (!) 178/64, pulse (!) 51, temperature (!) 97.1 F (36.2 C), temperature source Tympanic, resp. rate 18, height 5' 2.5" (1.588 m), weight 116 lb 14.4 oz (53 kg), SpO2 100 %.  LABORATORY DATA: Lab Results  Component Value Date   WBC 4.4 10/08/2021   HGB 11.8 (L) 10/08/2021   HCT 38.8 10/08/2021   MCV 74.8 (L) 10/08/2021   PLT 139 (L) 10/08/2021      Chemistry      Component Value Date/Time   NA 144 04/11/2021 1300   NA 144 11/24/2019 0851   K 4.1 04/11/2021 1300   CL 105 04/11/2021 1300   CO2 28 04/11/2021 1300   BUN 18 04/11/2021 1300   BUN 13 11/24/2019 0851   CREATININE 0.82 04/11/2021 1300   CREATININE 0.87 04/03/2017 1059      Component Value Date/Time   CALCIUM 9.8 04/11/2021 1300   ALKPHOS 95 04/11/2021 1300   AST 21 04/11/2021 1300   ALT 12 04/11/2021 1300   BILITOT 0.4 04/11/2021 1300       RADIOGRAPHIC STUDIES: No results found.  ASSESSMENT AND PLAN: This is a very pleasant 80 years old white female with mild thrombocytopenia likely ITP in addition to mild  microcytic anemia questionable to be secondary to iron deficiency versus hemoglobinopathy. She underwent extensive blood work last visit for her thrombocytopenia and anemia that were unremarkable. The patient has no clear family history of sickle cell disease or thalassemia but I will check hemoglobin electrophoresis today. Repeat CBC today showed further improvement in her hemoglobin and hematocrit as well as the platelets count. Iron study and ferritin are still pending. I recommended for the patient to continue on observation with repeat CBC, iron study and ferritin in 6 months. For the hypertension, she was advised to take her blood pressure medication as prescribed and to monitor it closely at home and discuss any adjustment of her medication with the primary care physician and cardiologist. The patient was advised to call immediately if she has any other concerning symptoms in the interval. The patient voices understanding of current disease status and treatment options and is in agreement with the current care plan.  All questions were answered. The patient knows to call the clinic with any problems, questions or concerns. We can certainly see the patient much sooner if necessary.  Disclaimer: This note was dictated with voice recognition software. Similar  sounding words can inadvertently be transcribed and may not be corrected upon review.

## 2021-10-08 NOTE — Telephone Encounter (Signed)
Left message with follow-up appointment per 10/10 los. 

## 2021-10-10 LAB — HGB FRACTIONATION CASCADE
Hgb A2: 2.1 % (ref 1.8–3.2)
Hgb A: 97.9 % (ref 96.4–98.8)
Hgb F: 0 % (ref 0.0–2.0)
Hgb S: 0 %

## 2021-10-12 DIAGNOSIS — F411 Generalized anxiety disorder: Secondary | ICD-10-CM | POA: Diagnosis not present

## 2021-10-12 DIAGNOSIS — R040 Epistaxis: Secondary | ICD-10-CM | POA: Diagnosis not present

## 2021-10-12 DIAGNOSIS — E785 Hyperlipidemia, unspecified: Secondary | ICD-10-CM | POA: Diagnosis not present

## 2021-10-12 DIAGNOSIS — I10 Essential (primary) hypertension: Secondary | ICD-10-CM | POA: Diagnosis not present

## 2021-10-12 DIAGNOSIS — Z91038 Other insect allergy status: Secondary | ICD-10-CM | POA: Diagnosis not present

## 2021-10-12 DIAGNOSIS — E1169 Type 2 diabetes mellitus with other specified complication: Secondary | ICD-10-CM | POA: Diagnosis not present

## 2021-10-19 NOTE — Progress Notes (Signed)
Sleep study 10/04/2021: No significant OSA. A critically low heart rate during PSG was present in wake and in sleep during recording, HR often in the 30s. A higher heart rate may prevent some arousals?

## 2021-10-19 NOTE — Procedures (Signed)
PATIENT'S NAME:  Donna Baldwin, Donna Baldwin DOB:      05-06-1941      MR#:    [id number]     DATE OF RECORDING: 10/04/2021  Jerrye Bushy, RPSGT REFERRING M.D.:  Lawerance Cruel, [place] Study Performed:   Baseline Polysomnogram HISTORY:  80 year -old Donna Baldwin was seen 08-07-2021 upon referral by her cardiology practice, [facility]/ Dr Einar Gip. Lawerance Cruel, PA is her referring provider.  The patient struggles with unexplained high blood pressures and the question of HTN possibly being caused by obstructive sleep apnea was raised.  She has a history of polyneuropathy attributed to diabetes, ocular migraines and positive antinuclear antibodies and has followed Dr. Metta Clines as primary neurologist. She has "chronic insomnia" but really has early sleep phase syndrome, going to bed at 5-7 PM. From here she sleeps until midnight, then again sleeps from 4-6 AM.  She has a medical history of Acid reflux, microcytic Anemia, Bradycardia, Depression/Anxiety, DM2, Tinnitus, Cervical spine DDD, nasal septum deviation, Atherosclerosis of coronary vessels, Coronary artery disease, GERD (gastroesophageal reflux disease), malignant HTN (hypertension) (12/03/2013), Hyperlipidemia, Hypertension, Hypertensive heart disease without heart failure, Thrombocytopenia ([place]), and Vitamin D deficiency. She reports frequent, vivid nightmares, palpitations and diaphoresis. She had a previous sleep study on March 18th 2014 at the [facility], interpreted by Dr. Annamaria Boots.   At the time she had no apnea: 0.7/h was her AHI. RDI was 7.4/h. The problem was that the patient's total sleep time was only 117 minutes.  The patient endorsed the Epworth Sleepiness Scale at 17 /24points. FSS endorsed at 51/63 points.     The patient's weight 116 pounds with a height of 62 (inches), resulting in a BMI of 21.0 kg/m2. The patient's neck circumference measured 12 inches.  CURRENT MEDICATIONS: Norvasc, Bystolic, Tums, Zyrtec,  Epinephrine, Flonase, Adil, Ativan, Elecon cream, Vitamin B6.Chronic user of Ativan.   PROCEDURE:  This is a multichannel digital polysomnogram utilizing the Somnostar 11.2 system.  Electrodes and sensors were applied and monitored per AASM Specifications.   EEG, EOG, Chin and Limb EMG, were sampled at 200 Hz.  ECG, Snore and Nasal Pressure, Thermal Airflow, Respiratory Effort, CPAP Flow and Pressure, Oximetry was sampled at 50 Hz. Digital video and audio were recorded.      BASELINE STUDY: Lights Out was at 21:06 and Lights On at 05:11.  Total recording time (TRT) was 485.5 minutes, with a total sleep time (TST) of 204 minutes.   The patient's sleep latency was 149.5 minutes.  REM latency was 0 minutes (No REM sleep recorded).  Sleep efficiency was 42.3 %.     SLEEP ARCHITECTURE: WASO (Wake after sleep onset) was 131.5 minutes.  There were 5 minutes in Stage N1, 121 minutes Stage N2, 78 minutes Stage N3 and 0 minutes in Stage REM.   The percentage of Stage N1 was 2.5%, Stage N2 was 59.3%, Stage N3 was 38.2% and Stage R (REM sleep) was 0%.   RESPIRATORY ANALYSIS:  1 obstructive apnea, 0 hypopneas. The patient also had 0 respiratory event related arousals (RERAs).     The total APNEA/HYPOPNEA INDEX (AHI) was 0.3/hour and the total RESPIRATORY DISTURBANCE INDEX was 0 .3 /hour.  0 events occurred in REM sleep and 0 events in NREM.  The patient spent 185 minutes of total sleep time in the supine position and 19 minutes in non-supine.  The supine AHI was 0.3 versus a non-supine AHI of 0.0.  OXYGEN SATURATION & C02:  [facility] baseline  02 saturation was 98%, with the lowest being 93%. Time spent below 89% saturation equaled 0 minutes. The arousals were noted as: 39 were spontaneous, 0 were associated with PLMs, 2 were associated with respiratory events. The patient had a total of 0 Periodic Limb Movements.   Audio and video analysis did not show any abnormal or unusual movements, behaviors, phonations or  vocalizations.  Only moderate Snoring was noted, all supine sleep related.  EKG: heart rate was regular and bradycardic with the mean HR in the 30s and 40s.   IMPRESSION:  The patient took Ativan to allow her to sleep- there was a very prolonged sleep latency noted, followed by a reduced overall sleep time and early arousal. Sleep aid worked only for about 2.5 hours.   No significant Sleep Apnea (SA), and only moderate snoring was recorded for less than 50% sleep time during supine sleep.  Isolated OS apnea caused arousal and during the following period of wakefulness an increase in heart rate was noted. (Several Screenshots attached)   Non-specific abnormal EKG, bradycardia, sustained.  Confusional Arousals - brief.  Slow baseline EEG, correlated to high proportion of N3 sleep. This slow baseline can indicate medication effect.    RECOMMENDATIONS: There is not intervention required in regards to sleep breathing. The patient sleeps usually only 2-4 hours after midnight. Sleep habits and lack of external stimuli or routines- the patient advanced her bedtime into the afternoon.  Consider Insomnia / circadian sleep behavior therapy primarily done with counseling.    Notably: A critically low heart rate during PSG was present in wake and in sleep during recording, HR often in the 30s. A higher heart rate may prevent some arousals?  I certify that I have reviewed the entire raw data recording prior to the issuance of this report in accordance with the Standards of Accreditation of the American Academy of Sleep Medicine (AASM)  Larey Seat, MD Diplomat, American Board of Psychiatry and Neurology  Diplomat, American Board of Sleep Medicine Market researcher, [facility] [employer]

## 2021-10-19 NOTE — Progress Notes (Signed)
The patient took Ativan to allow her to sleep- there was a very prolonged sleep latency noted, followed by a reduced overall sleep time and early arousal. Sleep aid worked only for about 2.5 hours.   1. No significant Sleep Apnea (SA), and only moderate snoring was recorded for less than 50% sleep time during supine sleep.  2. Isolated OS apnea caused arousal and during the following period of wakefulness an increase in heart rate was noted. (Several Screenshots attached)   3. Non-specific abnormal EKG, bradycardia, sustained.  4. Confusional Arousals - brief.  5. Slow baseline EEG, correlated to high proportion of N3 sleep. This slow baseline can indicate medication effect.    RECOMMENDATIONS: There is not intervention required in regards to sleep breathing. The patient sleeps usually only 2-4 hours after midnight. Sleep habits and lack of external stimuli or routines- the patient advanced her bedtime into the afternoon.  Consider Insomnia / circadian sleep behavior therapy primarily done with counseling.    Notably: A critically low heart rate during PSG was present in wake and in sleep during recording, HR often in the 30s. A higher heart rate may prevent some arousals?  There was no explanation for the reported high morning time blood pressures found.

## 2021-10-22 ENCOUNTER — Telehealth: Payer: Self-pay | Admitting: *Deleted

## 2021-10-22 NOTE — Telephone Encounter (Signed)
LVM for pt to call about results. °

## 2021-10-22 NOTE — Telephone Encounter (Signed)
-----   Message from Larey Seat, MD sent at 10/19/2021  2:16 PM EDT ----- The patient took Ativan to allow her to sleep- there was a very prolonged sleep latency noted, followed by a reduced overall sleep time and early arousal. Sleep aid worked only for about 2.5 hours.   1. No significant Sleep Apnea (SA), and only moderate snoring was recorded for less than 50% sleep time during supine sleep.  2. Isolated OS apnea caused arousal and during the following period of wakefulness an increase in heart rate was noted. (Several Screenshots attached)   3. Non-specific abnormal EKG, bradycardia, sustained.  4. Confusional Arousals - brief.  5. Slow baseline EEG, correlated to high proportion of N3 sleep. This slow baseline can indicate medication effect.    RECOMMENDATIONS: There is not intervention required in regards to sleep breathing. The patient sleeps usually only 2-4 hours after midnight. Sleep habits and lack of external stimuli or routines- the patient advanced her bedtime into the afternoon.  Consider Insomnia / circadian sleep behavior therapy primarily done with counseling.    Notably: A critically low heart rate during PSG was present in wake and in sleep during recording, HR often in the 30s. A higher heart rate may prevent some arousals?  There was no explanation for the reported high morning time blood pressures found.

## 2021-10-24 ENCOUNTER — Other Ambulatory Visit: Payer: Self-pay | Admitting: Cardiology

## 2021-10-30 ENCOUNTER — Encounter: Payer: Self-pay | Admitting: Neurology

## 2021-11-12 ENCOUNTER — Other Ambulatory Visit: Payer: Self-pay | Admitting: Cardiology

## 2021-11-29 IMAGING — CR DG CHEST 2V
2 series · 2 of 2 positions shown · non-contrast
Comparison: 01/10/2018

CLINICAL DATA: Dyspnea

EXAM:
CHEST - 2 VIEW

[w chest pa]
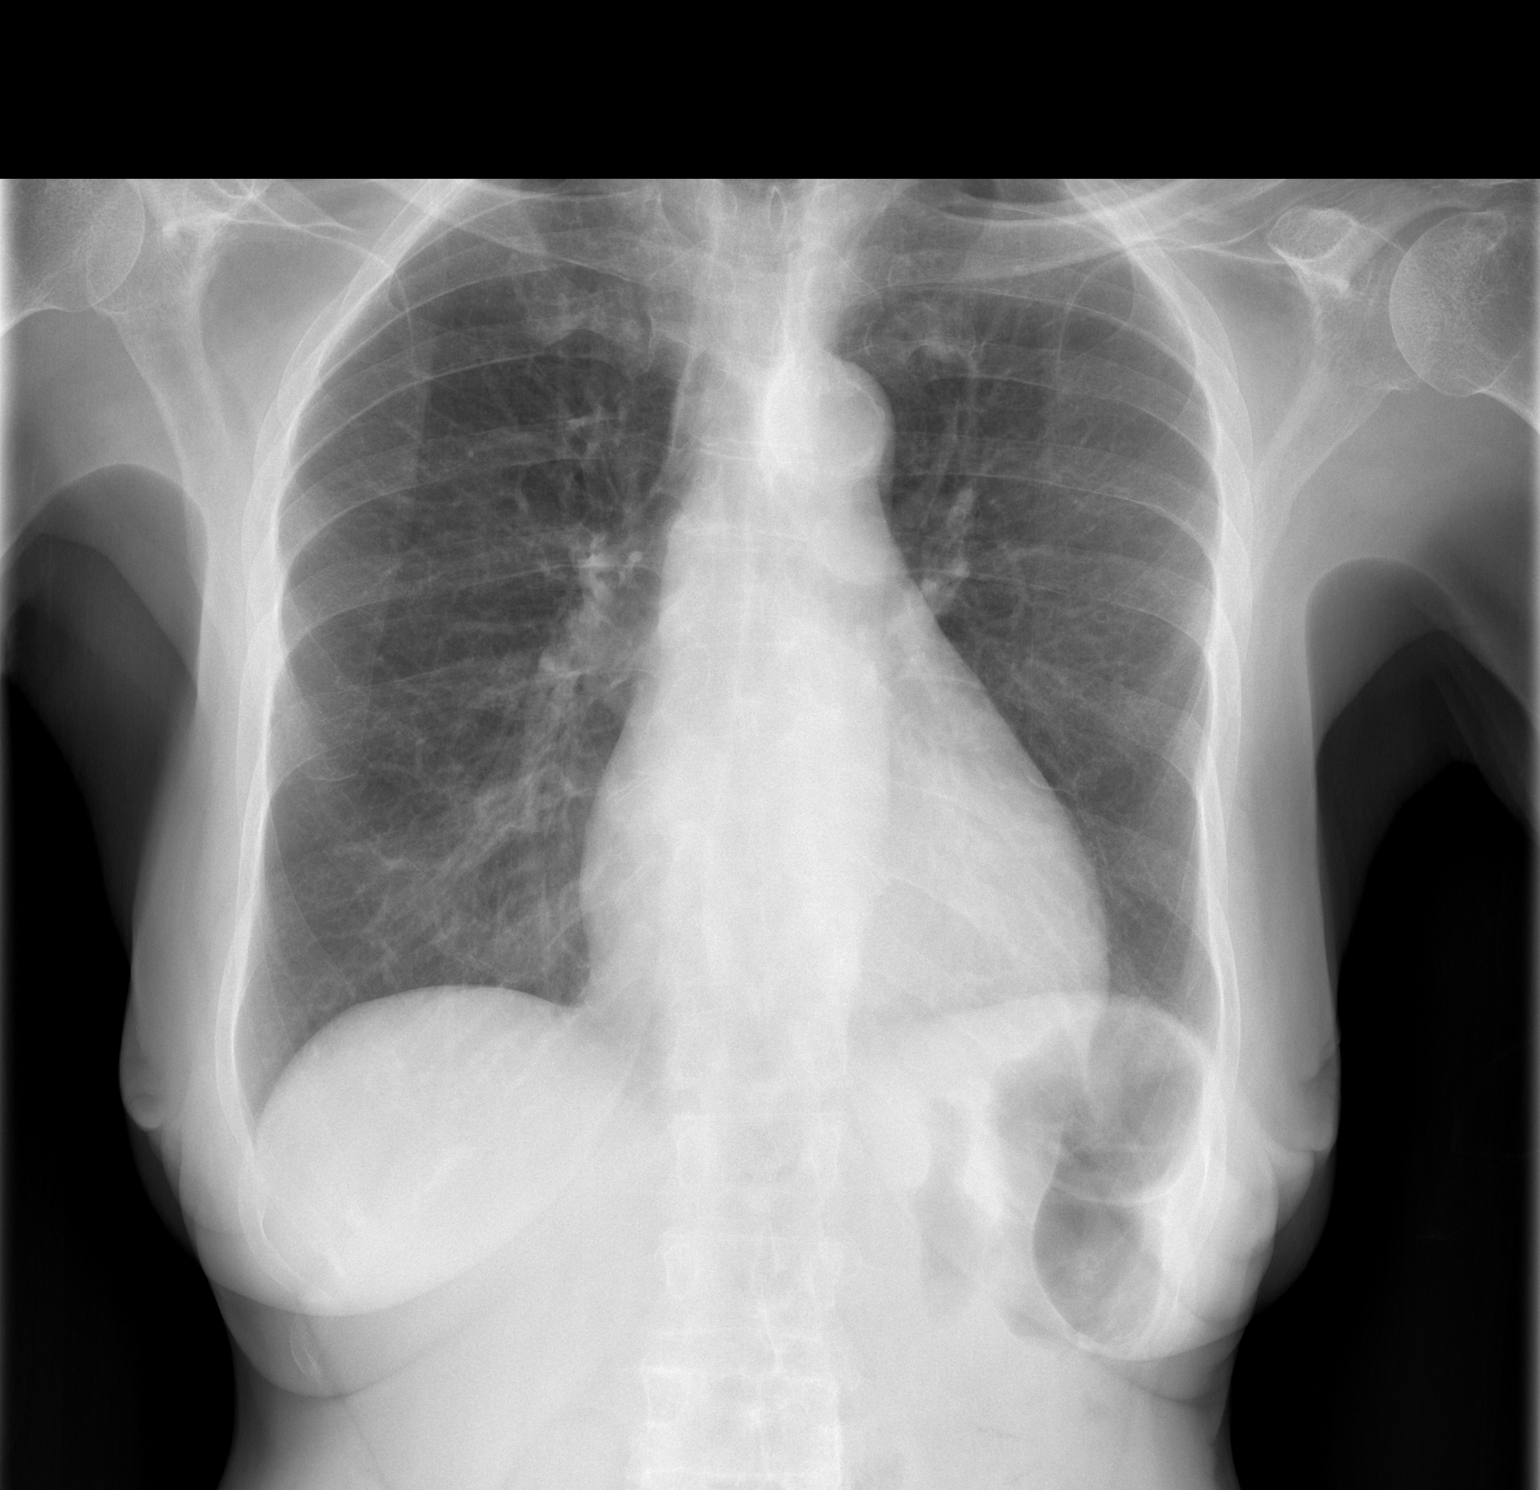

[w chest lat]
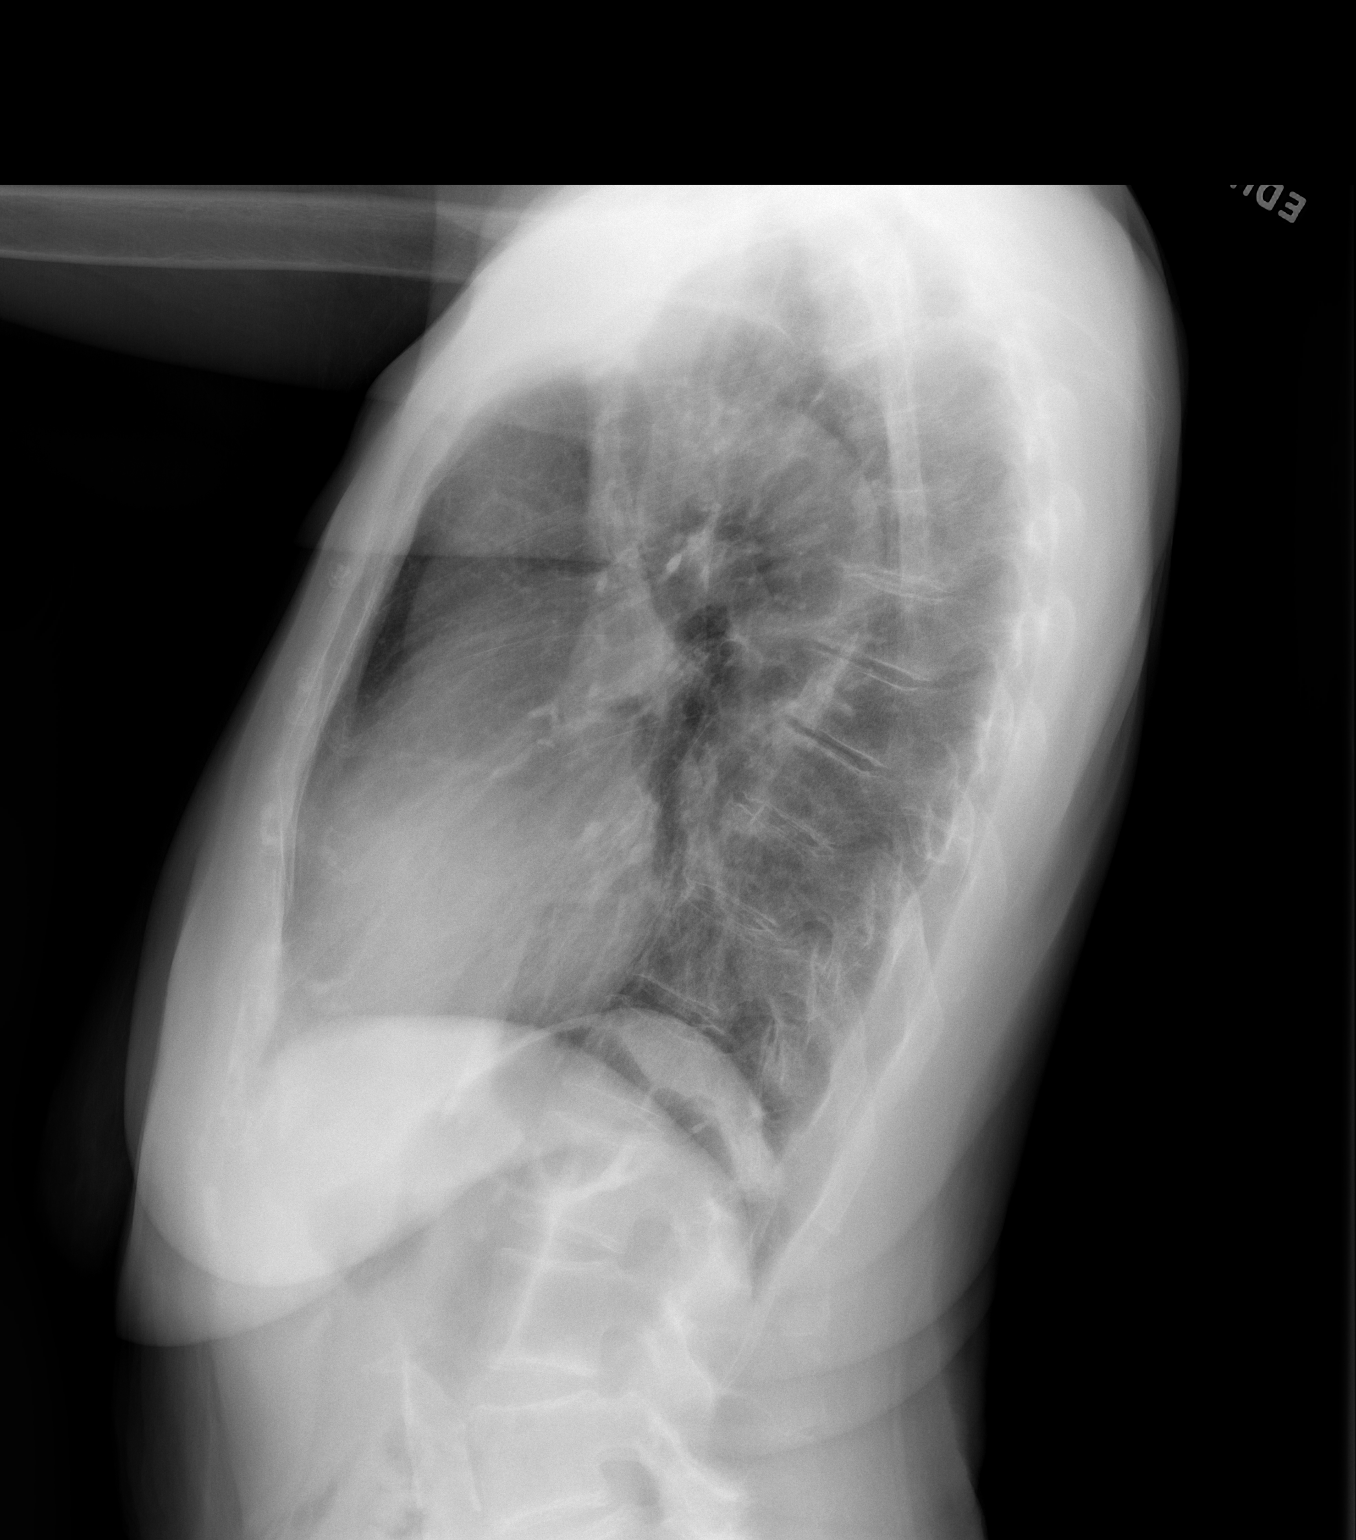

[2 of 2 positions shown; findings below may reference images not displayed]

FINDINGS: The heart size and mediastinal contours are within normal limits.
Both lungs are clear. Disc degenerative disease of the thoracic
spine.
IMPRESSION: No acute abnormality of the lungs.

## 2021-12-15 DIAGNOSIS — H1033 Unspecified acute conjunctivitis, bilateral: Secondary | ICD-10-CM | POA: Diagnosis not present

## 2021-12-17 DIAGNOSIS — H0015 Chalazion left lower eyelid: Secondary | ICD-10-CM | POA: Diagnosis not present

## 2021-12-17 DIAGNOSIS — Z961 Presence of intraocular lens: Secondary | ICD-10-CM | POA: Diagnosis not present

## 2021-12-17 DIAGNOSIS — H0288A Meibomian gland dysfunction right eye, upper and lower eyelids: Secondary | ICD-10-CM | POA: Diagnosis not present

## 2021-12-17 DIAGNOSIS — H0288B Meibomian gland dysfunction left eye, upper and lower eyelids: Secondary | ICD-10-CM | POA: Diagnosis not present

## 2021-12-17 DIAGNOSIS — G453 Amaurosis fugax: Secondary | ICD-10-CM | POA: Diagnosis not present

## 2021-12-18 ENCOUNTER — Other Ambulatory Visit: Payer: Self-pay | Admitting: Student

## 2021-12-18 DIAGNOSIS — E782 Mixed hyperlipidemia: Secondary | ICD-10-CM

## 2021-12-19 LAB — LIPID PANEL WITH LDL/HDL RATIO
Cholesterol, Total: 256 mg/dL — ABNORMAL HIGH (ref 100–199)
HDL: 86 mg/dL (ref >39)
LDL Chol Calc (NIH): 159 mg/dL — ABNORMAL HIGH (ref 0–99)
LDL/HDL Ratio: 1.8 ratio (ref 0.0–3.2)
Triglycerides: 67 mg/dL (ref 0–149)
VLDL Cholesterol Cal: 11 mg/dL (ref 5–40)

## 2022-01-02 NOTE — Progress Notes (Signed)
Patient is here for follow up visit.  Subjective:  Patient ID: Donna Baldwin, female    DOB: 09-15-1941, 81 y.o.   MRN: [id number]  Chief Complaint  Patient presents with   Hypertension   Follow-up    6 month   Coronary Artery Disease   Hyperlipidemia    81 y.o. African-American female with labile hypertension, asymptomatic bradycardia, type II diabetes mellitus, chronic dysphagia, interstitial cystitis.  Patient presents for 6 month follow up for hypertension, hyperlipidemia, and bradycardia.  At last office visit patient complained of dyspnea, therefore ordered echocardiogram Which revealed normal LVEF with grade 2 diastolic dysfunction mild valvular disease and mildly dilated LA.  Patient's lipids remain uncontrolled, however she is not on statin therapy due to history of intolerance.  Patient is feeling well overall.  She does continue to have occasional palpitations but these are fairly well controlled.  Denies chest pain, dyspnea, syncope, near syncope, leg edema, orthopnea.  Current Outpatient Medications on File Prior to Visit  Medication Sig Dispense Refill   amLODipine (NORVASC) 2.5 MG tablet Take 3 tablets (7.5 mg total) by mouth daily.     BYSTOLIC 10 MG tablet TAKE 1 TABLET BY MOUTH EVERY DAY 90 tablet 0   Calcium Carbonate Antacid (TUMS CHEWY BITES PO) Take 1 tablet by mouth daily as needed (acid reflux).     cetirizine (ZYRTEC) 10 MG tablet Take 10 mg by mouth daily as needed for allergies.      EPINEPHrine 0.3 mg/0.3 mL IJ SOAJ injection Inject 0.3 mg into the muscle once as needed (for anaphylaxis).  1   ibuprofen (ADVIL) 600 MG tablet Take 1 tablet (600 mg total) by mouth every 6 (six) hours as needed. 30 tablet 0   LORazepam (ATIVAN) 0.5 MG tablet Take 0.5 mg by mouth daily as needed for anxiety.     mometasone (ELOCON) 0.1 % cream Apply 1 application topically daily.     Pyridoxine HCl (VITAMIN B6) 50 MG TABS 1 tablet     No current facility-administered  medications on file prior to visit.    Cardiovascular studies: PCV ECHOCARDIOGRAM COMPLETE 07/04/2021 Left ventricle cavity is normal in size and wall thickness. Normal global wall motion. Normal LV systolic function with EF 54%. Doppler evidence of grade II (pseudonormal) diastolic dysfunction, elevated LAP. Calculated EF 54%. Left atrial cavity is mildly dilated. Mild (Grade I) mitral regurgitation. Mild tricuspid regurgitation. No evidence of pulmonary hypertension.   Ambulatory cardiac telemetry 06/05/2021 - 06/12/2021: Predominant rhythm was sinus.  Maximum heart rate 125, minimum heart rate 32, average heart rate 47 bpm.  Patient with 2 episodes of atrial tachycardia with longest lasting 12.6 seconds.  Patient had single sinus pause lasting 3.1 seconds at 4:30 AM, asymptomatic.  Patient triggered events correlated with sinus bradycardia, PVCs, PACs, and ventricular trigeminy.  No evidence of high degree AV block or ventricular tachycardia.  EKG 06/05/2021:  Sinus bradycardia at rate of 49 bpm. Left atrial enlargement. Normal axis. Incomplete RBBB. Non-specific T abnormality.   EKG 09/14/2020: Sinus bradycardia 44 bpm  Nonspecific ST-T changes  CTA 12/08/2019: 1. Coronary artery calcium score 108 Agatston units. This places the patient in the 54th percentile for age and gender, suggesting intermediate risk for future cardiac events. 2.  Nonobstructive ostial left main calcified plaque.  Upper Abdomen: Scattered foci of hepatic dome arterial hyperenhancement are diminutive and likely small perfusion anomalies. Normal imaged portions of the spleen, stomach.   Musculoskeletal: Thoracic spondylosis.   IMPRESSION: 1.  No acute findings in the imaged extracardiac chest. 2.  Aortic Atherosclerosis (ICD10-I70.0). 3. Esophageal air fluid level suggests dysmotility or gastroesophageal reflux.    Event monitor 01/07/2019- 02/05/2019: Sinus bradycardia, lowest HR 32 bpm @ 3:55  AM Occasional PVC's correlate with symptoms of fatigue. Highest HR 109 bpm.  Exercise Treadmill Stress Test 10/23/2018:  Indication: Bradycardia The patient exercised on Bruce protocol for 06:17  min. Patient achieved  7.50 METS and reached HR  110 bpm, which is  76 % of maximum age-predicted HR.  Stress test terminated due to fatigue.   Exercise capacity was below average for age. HR Response to Exercise: Sub-optimal secondary to limited exercise capacity BP Response to Exercise: Normal resting BP- appropriate response. Chest Pain: none. Arrhythmias: Occasional PVCs. Resting EKG demonstrates Normal sinus rhythm. ST Changes: With peak exercise there was no ST-T changes of ischemia.  Overall Impression: Submaximal stress test. Attenuated heart rate response without hypotension, lightheadedness Recommendations: Continue primary/secondary prevention. Consider alternate testing, if clinical suspicion for  obstructive CAD is  high.  Outside echocardiogram 07/06/2018: - Left ventricle: The cavity size was normal. Wall thickness was   normal. Systolic function was normal. The estimated ejection   fraction was in the range of 55% to 60%. Wall motion was normal;   there were no regional wall motion abnormalities. Doppler   parameters are consistent with abnormal left ventricular   relaxation (grade 1 diastolic dysfunction). - Left atrium: The atrium was mildly dilated. - Right atrium: The atrium was mildly dilated.   Impressions:   - Normal LV systolic function; mild diastolic dysfunction; mild   biatrial enlargement.  CT abdomen 06/2016: IMPRESSION: Nonspecific mild wall thickening of the distal stomach and duodenum can be seen with chronic gastritis or duodenitis. No evidence of bowel obstruction or perforation. Negative for abscess.  No other acute intra-abdominal or pelvic process by noncontrast CT. Aortoiliac atherosclerosis  Recent labs: CMP Latest Ref Rng & Units 04/11/2021  04/19/2020 04/18/2020  Glucose 70 - 99 mg/dL 94 136(H) -  BUN 8 - 23 mg/dL 18 7(L) -  Creatinine 0.44 - 1.00 mg/dL 0.82 0.72 0.62  Sodium 135 - 145 mmol/L 144 141 -  Potassium 3.5 - 5.1 mmol/L 4.1 3.9 -  Chloride 98 - 111 mmol/L 105 108 -  CO2 22 - 32 mmol/L 28 25 -  Calcium 8.9 - 10.3 mg/dL 9.8 9.2 -  Total Protein 6.5 - 8.1 g/dL 7.5 - -  Total Bilirubin 0.3 - 1.2 mg/dL 0.4 - -  Alkaline Phos 38 - 126 U/L 95 - -  AST 15 - 41 U/L 21 - -  ALT 0 - 44 U/L 12 - -   CBC Latest Ref Rng & Units 10/08/2021 07/09/2021 04/11/2021  WBC 4.0 - 10.5 K/uL 4.4 4.7 5.8  Hemoglobin 12.0 - 15.0 g/dL 11.8(L) 11.3(L) 11.3(L)  Hematocrit 36.0 - 46.0 % 38.8 36.7 37.5  Platelets 150 - 400 K/uL 139(L) 130(L) 155   Lipid Panel     Component Value Date/Time   CHOL 256 (H) 12/18/2021 0947   TRIG 67 12/18/2021 0947   HDL 86 12/18/2021 0947   CHOLHDL 2.6 10/31/2020 0844   CHOLHDL 3.3 07/12/2014 0936   VLDL 14 07/12/2014 0936   LDLCALC 159 (H) 12/18/2021 0947   HEMOGLOBIN A1C No results found for: HGBA1C, MPG TSH No results for input(s): TSH in the last 8760 hours.  04/11/2021: Sodium 144, potassium 4.1, glucose 94, BUN 18, creatinine 0.82, AST 29, ALT 12, alk phos 24,  GFR >60 Hemoglobin 11.3, hematocrit 37.5, MCV 74.4, platelet 155  04/19/2020: Glucose 136, BUN/Cr 7/0.72. EGFR >60. Na/K 141/3.9.  H/H 10.3/35. MCV 76. Platelets 108  11/2019: Chol 244, TG 77, HDL 94, LDL 141  2019: TSH 31. normal   Review of Systems  Cardiovascular:  Positive for palpitations (occasional). Negative for chest pain, claudication, leg swelling, near-syncope, orthopnea, paroxysmal nocturnal dyspnea and syncope.  Respiratory:  Negative for shortness of breath.   Neurological:  Negative for dizziness.      Objective:     Vitals:   01/03/22 1013 01/03/22 1014  BP: (!) 152/71 134/66  Pulse: 63 (!) 57  Resp: 16   SpO2: 99% 99%     Physical Exam Vitals reviewed.  Constitutional:      Appearance: She is  underweight.  Neck:     Vascular: No JVD.  Cardiovascular:     Rate and Rhythm: Normal rate and regular rhythm.     Pulses: Intact distal pulses.     Heart sounds: Normal heart sounds, S1 normal and S2 normal. No murmur heard.   No gallop.  Pulmonary:     Effort: Pulmonary effort is normal. No respiratory distress.     Breath sounds: Normal breath sounds. No wheezing, rhonchi or rales.  Musculoskeletal:     Right lower leg: No edema.     Left lower leg: No edema.  Neurological:     Mental Status: She is alert.        Assessment & Recommendations:   81 y.o. African-American female with labile hypertension, hyperlipidemia, nonobstructive CAD, type II diabetes mellitus, chronic dysphagia, interstitial cystitis,   Essential hypertension: Controlled.  Continue amlodipine 7.5 mg daily, Bystolic Continue to monitor blood pressure on a regular basis at home.  Dyspnea:  Reviewed and discussed with patient results of echocardiogram, details above.  Normal LV systolic function with grade 2 diastolic function. Patient is euvolemic on exam, there is no clinical evidence of heart failure at this time. Patient's dyspnea has improved.  Suspect it is multifactorial including deconditioning.  Encourage patient to increase physical activity and continue to monitor symptoms. Patient underwent sleep study, no evidence of sleep apnea.  Palpitations:  Patient has history of both PVCs/PACs and bradycardia.  She is presently relatively asymptomatic and palpitations are fairly well controlled with Bystolic Discussed option to reduce Bystolic however patient is hesitant to make changes at this time as she is feeling well overall.  Could consider further ischemic evaluation, however again shared decision was to hold off at this time  Hyperlipidemia: Statin intolerant.  Lipids remain uncontrolled.  Given mild nonobstructive CAD and advance age patient prefers to hold off on initiation of PCSK9  inhibitor.  CAD without angina: Continue medical management of cardiovascular risk factors.  Will defer management of aspirin to PCP in the context of anemia which PCP is managing for patient.  Follow-up in 6 months, sooner if needed, for hypertension, hyperlipidemia, bradycardia with repeat EKG.    Alethia Berthold, PA-C 01/03/2022, 12:16 PM Office: [phone]

## 2022-01-03 ENCOUNTER — Encounter: Payer: Self-pay | Admitting: Student

## 2022-01-03 ENCOUNTER — Other Ambulatory Visit: Payer: Self-pay

## 2022-01-03 ENCOUNTER — Ambulatory Visit: Payer: Medicare PPO | Admitting: Student

## 2022-01-03 VITALS — BP 134/66 | HR 57 | Resp 16 | Ht 62.0 in | Wt 112.0 lb

## 2022-01-03 DIAGNOSIS — E782 Mixed hyperlipidemia: Secondary | ICD-10-CM | POA: Diagnosis not present

## 2022-01-03 DIAGNOSIS — R001 Bradycardia, unspecified: Secondary | ICD-10-CM

## 2022-01-03 DIAGNOSIS — I2510 Atherosclerotic heart disease of native coronary artery without angina pectoris: Secondary | ICD-10-CM | POA: Diagnosis not present

## 2022-01-03 DIAGNOSIS — I10 Essential (primary) hypertension: Secondary | ICD-10-CM | POA: Diagnosis not present

## 2022-01-03 DIAGNOSIS — R002 Palpitations: Secondary | ICD-10-CM | POA: Diagnosis not present

## 2022-01-21 ENCOUNTER — Other Ambulatory Visit: Payer: Self-pay

## 2022-01-21 MED ORDER — BYSTOLIC 10 MG PO TABS: 10.0000 mg | ORAL_TABLET | Freq: Every day | ORAL | 0 refills | Status: DC

## 2022-02-05 DIAGNOSIS — G453 Amaurosis fugax: Secondary | ICD-10-CM | POA: Diagnosis not present

## 2022-02-05 DIAGNOSIS — H04123 Dry eye syndrome of bilateral lacrimal glands: Secondary | ICD-10-CM | POA: Diagnosis not present

## 2022-02-05 DIAGNOSIS — Z961 Presence of intraocular lens: Secondary | ICD-10-CM | POA: Diagnosis not present

## 2022-02-05 DIAGNOSIS — H0288A Meibomian gland dysfunction right eye, upper and lower eyelids: Secondary | ICD-10-CM | POA: Diagnosis not present

## 2022-02-05 DIAGNOSIS — H0288B Meibomian gland dysfunction left eye, upper and lower eyelids: Secondary | ICD-10-CM | POA: Diagnosis not present

## 2022-04-05 ENCOUNTER — Other Ambulatory Visit: Payer: Self-pay | Admitting: Physician Assistant

## 2022-04-05 DIAGNOSIS — D696 Thrombocytopenia, unspecified: Secondary | ICD-10-CM

## 2022-04-08 ENCOUNTER — Inpatient Hospital Stay: Payer: Medicare HMO

## 2022-04-08 ENCOUNTER — Inpatient Hospital Stay: Payer: Medicare HMO | Attending: Internal Medicine | Admitting: Internal Medicine

## 2022-04-08 ENCOUNTER — Other Ambulatory Visit: Payer: Self-pay

## 2022-04-08 VITALS — BP 146/69 | HR 51 | Temp 97.7°F | Resp 17 | Ht 62.0 in | Wt 123.5 lb

## 2022-04-08 DIAGNOSIS — I119 Hypertensive heart disease without heart failure: Secondary | ICD-10-CM | POA: Diagnosis not present

## 2022-04-08 DIAGNOSIS — D696 Thrombocytopenia, unspecified: Secondary | ICD-10-CM | POA: Insufficient documentation

## 2022-04-08 DIAGNOSIS — I2510 Atherosclerotic heart disease of native coronary artery without angina pectoris: Secondary | ICD-10-CM | POA: Diagnosis not present

## 2022-04-08 DIAGNOSIS — D72819 Decreased white blood cell count, unspecified: Secondary | ICD-10-CM | POA: Diagnosis not present

## 2022-04-08 DIAGNOSIS — D509 Iron deficiency anemia, unspecified: Secondary | ICD-10-CM | POA: Diagnosis not present

## 2022-04-08 LAB — CBC WITH DIFFERENTIAL (CANCER CENTER ONLY)
Abs Immature Granulocytes: 0 10*3/uL (ref 0.00–0.07)
Basophils Absolute: 0 10*3/uL (ref 0.0–0.1)
Basophils Relative: 1 %
Eosinophils Absolute: 0.1 10*3/uL (ref 0.0–0.5)
Eosinophils Relative: 2 %
HCT: 38.8 % (ref 36.0–46.0)
Hemoglobin: 11.5 g/dL — ABNORMAL LOW (ref 12.0–15.0)
Immature Granulocytes: 0 %
Lymphocytes Relative: 41 %
Lymphs Abs: 1.3 10*3/uL (ref 0.7–4.0)
MCH: 22.5 pg — ABNORMAL LOW (ref 26.0–34.0)
MCHC: 29.6 g/dL — ABNORMAL LOW (ref 30.0–36.0)
MCV: 75.9 fL — ABNORMAL LOW (ref 80.0–100.0)
Monocytes Absolute: 0.3 10*3/uL (ref 0.1–1.0)
Monocytes Relative: 9 %
Neutro Abs: 1.5 10*3/uL — ABNORMAL LOW (ref 1.7–7.7)
Neutrophils Relative %: 47 %
Platelet Count: 125 10*3/uL — ABNORMAL LOW (ref 150–400)
RBC: 5.11 MIL/uL (ref 3.87–5.11)
RDW: 15 % (ref 11.5–15.5)
WBC Count: 3.1 10*3/uL — ABNORMAL LOW (ref 4.0–10.5)
nRBC: 0 % (ref 0.0–0.2)

## 2022-04-08 LAB — IRON AND IRON BINDING CAPACITY (CC-WL,HP ONLY)
Iron: 86 ug/dL (ref 28–170)
Saturation Ratios: 23 % (ref 10.4–31.8)
TIBC: 377 ug/dL (ref 250–450)
UIBC: 291 ug/dL (ref 148–442)

## 2022-04-08 LAB — FERRITIN: Ferritin: 87 ng/mL (ref 11–307)

## 2022-04-08 NOTE — Progress Notes (Signed)
?    [facility] ?[phone]   [phone] ? ?OFFICE PROGRESS NOTE ? ?Harlan Stains, MD ?[address] ?[place] [address] ? ?DIAGNOSIS:  ?1) Thrombocytopenia likely ITP. ?2) microcytic anemia of unclear etiology.  ? ?PRIOR THERAPY: None ? ?CURRENT THERAPY: Observation ? ?INTERVAL HISTORY: ?Donna Baldwin 81 y.o. female returns to the clinic today for follow-up visit accompanied by her husband.  The patient is feeling fine today with no concerning complaints except for mild fatigue.  She has no bleeding, bruises or ecchymosis.  She has no nausea, vomiting, diarrhea or constipation.  She has no headache or visual changes.  She has no significant weight loss or night sweats.  She is here today for evaluation and repeat blood work. ? ? ?MEDICAL HISTORY: ?Past Medical History:  ?Diagnosis Date  ? Acid reflux   ? Anemia   ? Anxiety   ? Atherosclerosis   ? Back pain   ? Coronary artery disease   ? GERD (gastroesophageal reflux disease)   ? HTN (hypertension) 12/03/2013  ? CT scan-no pheochromocytoma  ? Hyperlipidemia   ? Hypertension   ? Hypertensive heart disease without heart failure   ? Thrombocytopenia ([place])   ? Vitamin D deficiency   ? ? ?ALLERGIES:  is allergic to acetaminophen; beef allergy; beef-derived products; codeine; hydralazine; iodine; ivp dye [iodinated contrast media]; latex; morphine and related; penicillins; sertraline; shellfish allergy; solu-medrol [methylprednisolone]; sulfa antibiotics; barbiturates; bee pollen; bee venom; diovan [valsartan]; gabapentin; livalo [pitavastatin]; other; oxycodone; promethazine-dm; tape; wellness essentials blood sugr [nutritional supplements]; albuterol; ciprofloxacin; crestor [rosuvastatin]; famotidine; pred forte [prednisolone acetate]; welchol [colesevelam]; celebrex [celecoxib]; lanolin; mobic [meloxicam]; nickel; nitrates, organic; petrolatum; soy allergy; and strawberry extract. ? ?MEDICATIONS:  ?Current  Outpatient Medications  ?Medication Sig Dispense Refill  ? amLODipine (NORVASC) 2.5 MG tablet Take 3 tablets (7.5 mg total) by mouth daily.    ? BYSTOLIC 10 MG tablet Take 1 tablet (10 mg total) by mouth daily. 90 tablet 0  ? Calcium Carbonate Antacid (TUMS CHEWY BITES PO) Take 1 tablet by mouth daily as needed (acid reflux).    ? cetirizine (ZYRTEC) 10 MG tablet Take 10 mg by mouth daily as needed for allergies.     ? EPINEPHrine 0.3 mg/0.3 mL IJ SOAJ injection Inject 0.3 mg into the muscle once as needed (for anaphylaxis).  1  ? ibuprofen (ADVIL) 600 MG tablet Take 1 tablet (600 mg total) by mouth every 6 (six) hours as needed. 30 tablet 0  ? LORazepam (ATIVAN) 0.5 MG tablet Take 0.5 mg by mouth daily as needed for anxiety.    ? mometasone (ELOCON) 0.1 % cream Apply 1 application topically daily.    ? Pyridoxine HCl (VITAMIN B6) 50 MG TABS 1 tablet    ? ?No current facility-administered medications for this visit.  ? ? ?SURGICAL HISTORY:  ?Past Surgical History:  ?Procedure Laterality Date  ? ESOPHAGEAL MANOMETRY N/A 03/15/2020  ? Procedure: ESOPHAGEAL MANOMETRY (EM);  Surgeon: Wilford Corner, MD;  Location: [facility];  Service: Endoscopy;  Laterality: N/A;  ? EYE SURGERY    ? HELLER MYOTOMY N/A 04/18/2020  ? Procedure: LAPAROSCOPIC HELLER MYOTOMY WITH UPPER ENDOSCOPY;  Surgeon: Kinsinger, Arta Bruce, MD;  Location: [facility];  Service: General;  Laterality: N/A;  ? [place] N/A 03/15/2020  ? Procedure: [place] IMPEDANCE STUDY;  Surgeon: Wilford Corner, MD;  Location: [facility];  Service: Endoscopy;  Laterality: N/A;  ? SHOULDER SURGERY Bilateral   ? TONSILLECTOMY    ? ? ?  REVIEW OF SYSTEMS:  A comprehensive review of systems was negative except for: Constitutional: positive for fatigue  ? ?PHYSICAL EXAMINATION: General appearance: alert, cooperative, and no distress ?Head: Normocephalic, without obvious abnormality, atraumatic ?Neck: no adenopathy, no JVD, supple, symmetrical, trachea midline, and  thyroid not enlarged, symmetric, no tenderness/mass/nodules ?Lymph nodes: Cervical, supraclavicular, and axillary nodes normal. ?Resp: clear to auscultation bilaterally ?Back: symmetric, no curvature. ROM normal. No CVA tenderness. ?Cardio: regular rate and rhythm, S1, S2 normal, no murmur, click, rub or gallop ?GI: soft, non-tender; bowel sounds normal; no masses,  no organomegaly ?Extremities: extremities normal, atraumatic, no cyanosis or edema ? ?ECOG PERFORMANCE STATUS: 1 - Symptomatic but completely ambulatory ? ?Blood pressure (!) 146/69, pulse (!) 51, temperature 97.7 ?F (36.5 ?C), temperature source Tympanic, resp. rate 17, height 5' 2"  (1.575 m), weight 123 lb 8 oz (56 kg), SpO2 100 %. ? ?LABORATORY DATA: ?Lab Results  ?Component Value Date  ? WBC 3.1 (L) 04/08/2022  ? HGB 11.5 (L) 04/08/2022  ? HCT 38.8 04/08/2022  ? MCV 75.9 (L) 04/08/2022  ? PLT 125 (L) 04/08/2022  ? ? ?  Chemistry   ?   ?Component Value Date/Time  ? NA 144 04/11/2021 1300  ? NA 144 11/24/2019 0851  ? K 4.1 04/11/2021 1300  ? CL 105 04/11/2021 1300  ? CO2 28 04/11/2021 1300  ? BUN 18 04/11/2021 1300  ? BUN 13 11/24/2019 0851  ? CREATININE 0.82 04/11/2021 1300  ? CREATININE 0.87 04/03/2017 1059  ?    ?Component Value Date/Time  ? CALCIUM 9.8 04/11/2021 1300  ? ALKPHOS 95 04/11/2021 1300  ? AST 21 04/11/2021 1300  ? ALT 12 04/11/2021 1300  ? BILITOT 0.4 04/11/2021 1300  ?  ? ? ? ?RADIOGRAPHIC STUDIES: ?No results found. ? ?ASSESSMENT AND PLAN: This is a very pleasant 81 years old white female with mild thrombocytopenia likely ITP in addition to mild microcytic anemia questionable to be secondary to iron deficiency versus hemoglobinopathy. ?The patient is currently on observation and she is doing fine with no concerning issues except for mild fatigue. ?Repeat CBC today showed persistent anemia as well as thrombocytopenia but in addition she developed mild leukocytopenia.  This would be concerning for underlying bone marrow abnormality or  vitamin deficiency. ?I recommended for the patient to start taking multivitamins in addition to B12 supplements. ?I will see her back for follow-up visit in 3 months for evaluation and repeat blood work and if she continues to have pancytopenia, I would consider her for a bone marrow biopsy and aspirate at that time. ?The patient and her husband are in agreement with the current plan. ?For the hypertension, she was advised to take her blood pressure medication as prescribed and to monitor it closely at home and discuss any adjustment of her medication with the primary care physician and cardiologist. ?She was advised to call immediately if she has any other concerning symptoms in the interval. ?The patient voices understanding of current disease status and treatment options and is in agreement with the current care plan. ? ?All questions were answered. The patient knows to call the clinic with any problems, questions or concerns. We can certainly see the patient much sooner if necessary. ? ?Disclaimer: This note was dictated with voice recognition software. Similar sounding words can inadvertently be transcribed and may not be corrected upon review. ? ? ?  ?   ?

## 2022-04-19 ENCOUNTER — Other Ambulatory Visit: Payer: Self-pay | Admitting: Family Medicine

## 2022-04-19 DIAGNOSIS — G453 Amaurosis fugax: Secondary | ICD-10-CM | POA: Diagnosis not present

## 2022-04-19 DIAGNOSIS — E559 Vitamin D deficiency, unspecified: Secondary | ICD-10-CM | POA: Diagnosis not present

## 2022-04-19 DIAGNOSIS — I700 Atherosclerosis of aorta: Secondary | ICD-10-CM | POA: Diagnosis not present

## 2022-04-19 DIAGNOSIS — E785 Hyperlipidemia, unspecified: Secondary | ICD-10-CM | POA: Diagnosis not present

## 2022-04-19 DIAGNOSIS — D696 Thrombocytopenia, unspecified: Secondary | ICD-10-CM | POA: Diagnosis not present

## 2022-04-19 DIAGNOSIS — F411 Generalized anxiety disorder: Secondary | ICD-10-CM | POA: Diagnosis not present

## 2022-04-19 DIAGNOSIS — D509 Iron deficiency anemia, unspecified: Secondary | ICD-10-CM | POA: Diagnosis not present

## 2022-04-19 DIAGNOSIS — E1169 Type 2 diabetes mellitus with other specified complication: Secondary | ICD-10-CM | POA: Diagnosis not present

## 2022-04-19 DIAGNOSIS — I10 Essential (primary) hypertension: Secondary | ICD-10-CM | POA: Diagnosis not present

## 2022-04-23 ENCOUNTER — Ambulatory Visit
Admission: RE | Admit: 2022-04-23 | Discharge: 2022-04-23 | Disposition: A | Discharge status: Home / Self Care | Payer: Medicare HMO | Source: Ambulatory Visit | Attending: Family Medicine | Admitting: Family Medicine

## 2022-04-23 ENCOUNTER — Other Ambulatory Visit: Payer: Self-pay | Admitting: Family Medicine

## 2022-04-23 DIAGNOSIS — Z8673 Personal history of transient ischemic attack (TIA), and cerebral infarction without residual deficits: Secondary | ICD-10-CM | POA: Diagnosis not present

## 2022-04-23 DIAGNOSIS — G453 Amaurosis fugax: Secondary | ICD-10-CM

## 2022-04-23 DIAGNOSIS — I6523 Occlusion and stenosis of bilateral carotid arteries: Secondary | ICD-10-CM | POA: Diagnosis not present

## 2022-04-26 ENCOUNTER — Telehealth: Payer: Self-pay | Admitting: Pharmacist

## 2022-04-26 DIAGNOSIS — I10 Essential (primary) hypertension: Secondary | ICD-10-CM

## 2022-04-26 MED ORDER — AMLODIPINE BESYLATE 5 MG PO TABS: 10.0000 mg | ORAL_TABLET | Freq: Every day | ORAL | 1 refills | Status: DC

## 2022-04-28 ENCOUNTER — Ambulatory Visit
Admission: RE | Admit: 2022-04-28 | Discharge: 2022-04-28 | Disposition: A | Discharge status: Home / Self Care | Payer: Medicare HMO | Source: Ambulatory Visit | Attending: Family Medicine | Admitting: Family Medicine

## 2022-04-28 DIAGNOSIS — I739 Peripheral vascular disease, unspecified: Secondary | ICD-10-CM | POA: Diagnosis not present

## 2022-04-28 DIAGNOSIS — G453 Amaurosis fugax: Secondary | ICD-10-CM

## 2022-04-28 DIAGNOSIS — H5461 Unqualified visual loss, right eye, normal vision left eye: Secondary | ICD-10-CM | POA: Diagnosis not present

## 2022-05-02 DIAGNOSIS — H5203 Hypermetropia, bilateral: Secondary | ICD-10-CM | POA: Diagnosis not present

## 2022-05-08 ENCOUNTER — Other Ambulatory Visit: Payer: Self-pay | Admitting: *Deleted

## 2022-05-08 DIAGNOSIS — I6529 Occlusion and stenosis of unspecified carotid artery: Secondary | ICD-10-CM

## 2022-05-10 DIAGNOSIS — G453 Amaurosis fugax: Secondary | ICD-10-CM | POA: Diagnosis not present

## 2022-05-16 ENCOUNTER — Ambulatory Visit (HOSPITAL_COMMUNITY)
Admission: RE | Admit: 2022-05-16 | Discharge: 2022-05-16 | Disposition: A | Discharge status: Home / Self Care | Payer: Medicare HMO | Source: Ambulatory Visit | Attending: Vascular Surgery | Admitting: Vascular Surgery

## 2022-05-16 ENCOUNTER — Encounter: Payer: Self-pay | Admitting: Vascular Surgery

## 2022-05-16 ENCOUNTER — Ambulatory Visit: Payer: Medicare HMO | Admitting: Vascular Surgery

## 2022-05-16 VITALS — BP 200/80 | HR 48 | Temp 98.3°F | Resp 18 | Ht 62.0 in | Wt 120.0 lb

## 2022-05-16 DIAGNOSIS — I6522 Occlusion and stenosis of left carotid artery: Secondary | ICD-10-CM | POA: Diagnosis not present

## 2022-05-16 DIAGNOSIS — I6529 Occlusion and stenosis of unspecified carotid artery: Secondary | ICD-10-CM | POA: Diagnosis not present

## 2022-05-16 NOTE — Progress Notes (Signed)
ASSESSMENT & PLAN   40 TO 59% ASYMPTOMATIC LEFT CAROTID STENOSIS: This patient had temporary blindness in the right eye but has no significant right carotid stenosis.  I do not get any clear-cut symptoms of amaurosis fugax in the left eye where she has a moderate left carotid stenosis.  In our duplex scan today she had only a moderate 40 to 59% stenosis.  She has been started on aspirin and is soon to start [place] for her cholesterol given that she has had problems with statins.  Thus at this point I would consider the left carotid stenosis asymptomatic.  I explained we would not consider carotid endarterectomy unless the stenosis progressed to greater than 80% or she develop new left hemispheric symptoms.  I have encouraged her to continue taking her aspirin.  We also discussed the importance of exercise and nutrition.  I have ordered a follow-up carotid duplex scan in 6 months and I will see her back at that time.  She knows to call sooner if she has problems.  REASON FOR CONSULT:    Left carotid stenosis with history of amaurosis fugax.  The consult is requested by Dr. Harlan Stains.  HPI:   Donna Baldwin is a 81 y.o. female who was referred with a left carotid stenosis and history of amaurosis fugax.  I have reviewed the records from the referring office.  The patient was seen on 04/19/2022.  She is followed with diabetes, hypertension, and hypercholesterolemia.  At that visit she noted an episode 4 to 5 days prior when she she had visual issues in both eyes.  She does have a history of ocular migraines and apparently did have a headache at that time.  She was instructed to begin taking aspirin.  Of note her cholesterol is 264 at that time with an LDL cholesterol 164.  Her creatinine was 0.77.  Hemoglobin A1c was 5.9.  Of note she has had an issue taking multiple statins for her cholesterol but is being considered for Repatha.  On my history, the patient has a history of ocular migraines.   In 2015 she did have an episode with transient loss of vision in the left eye.  She had no recent symptoms until recently about 3 weeks ago when she had an episode at night where she had brightness in the left eye and lost vision temporarily in the right eye.  I do not get any history of amaurosis fugax in the left eye.  She denies any previous history of stroke, TIAs, expressive or receptive aphasia.  She does tell me that she saw her ophthalmologist who said she had a stroke in the right eye.  They did not see anything in the left eye.  She does have a history of thrombocytopenia.  Past Medical History:  Diagnosis Date   Acid reflux    Anemia    Anxiety    Atherosclerosis    Back pain    Coronary artery disease    GERD (gastroesophageal reflux disease)    HTN (hypertension) 12/03/2013   CT scan-no pheochromocytoma   Hyperlipidemia    Hypertension    Hypertensive heart disease without heart failure    Thrombocytopenia ([facility])    Vitamin D deficiency     Family History  Problem Relation Age of Onset   Cancer Father    Allergic rhinitis Neg Hx    Angioedema Neg Hx    Asthma Neg Hx    Atopy Neg Hx  Eczema Neg Hx    Immunodeficiency Neg Hx    Urticaria Neg Hx     SOCIAL HISTORY: Social History   Tobacco Use   Smoking status: Never   Smokeless tobacco: Never  Substance Use Topics   Alcohol use: No    Alcohol/week: 0.0 standard drinks    Allergies  Allergen Reactions   Acetaminophen Other (See Comments)    Tremor, anxiety   Beef Allergy Anaphylaxis   Beef-Derived Products Anaphylaxis   Codeine Hives and Anxiety   Hydralazine Hives and Shortness Of Breath   Iodine Anaphylaxis   Ivp Dye [Iodinated Contrast Media] Anaphylaxis and Other (See Comments)   Latex Rash   Morphine And Related Other (See Comments)    Tremors, increased heart rate, excitement, confusion per pt   Penicillins Hives    Has patient had a PCN reaction causing immediate rash, facial/tongue/throat  swelling, SOB or lightheadedness with hypotension: Yes Has patient had a PCN reaction causing severe rash involving mucus membranes or skin necrosis: Yes Has patient had a PCN reaction that required hospitalization: Yes Has patient had a PCN reaction occurring within the last 10 years: No If all of the above answers are "NO", then may proceed with Cephalosporin use.    Sertraline Other (See Comments)    Numbness in mouth and hyperactivity   Shellfish Allergy Hives   Solu-Medrol [Methylprednisolone] Other (See Comments)    Numbness and tingling in mouth   Sulfa Antibiotics Hives and Rash   Barbiturates Other (See Comments)    Excitement    Bee Pollen Hives   Bee Venom Hives   Diovan [Valsartan] Nausea Only   Gabapentin Other (See Comments)    Visual disturbance   Livalo [Pitavastatin] Swelling and Other (See Comments)    Facial swelling   Other Palpitations, Other (See Comments) and Rash    NARCOTIC ANALGESICS-TREMORS, INCREASED HEART RATE Hmg-Coa Reductase Inhibitors - intolerance unknown    Oxycodone Swelling, Rash and Cough   Promethazine-Dm Other (See Comments)    "Felt funny"   Tape Rash and Other (See Comments)    Red blotches    Wellness Essentials Blood Sugr [Nutritional Supplements] Other (See Comments)    Nuts, strawberries, bicarbonate of soda,   Albuterol Other (See Comments)    Caused wheezing   Ciprofloxacin Other (See Comments)   Crestor [Rosuvastatin] Other (See Comments)   Famotidine Other (See Comments)    Unknown reaction   Pred Forte [Prednisolone Acetate] Other (See Comments)    Patient is seeing odd color and images while using this   Welchol [Colesevelam]     Other reaction(s): myalgias   Celebrex [Celecoxib] Swelling and Other (See Comments)    Swelling and numbness (mouth)   Lanolin Itching   Mobic [Meloxicam] Swelling and Other (See Comments)    Swelling and numbness in mouth   Nickel Rash   Nitrates, Organic Rash and Other (See Comments)     Chest tightness also   Petrolatum Rash and Other (See Comments)   Soy Allergy Rash   Strawberry Extract Rash    Current Outpatient Medications  Medication Sig Dispense Refill   amLODipine (NORVASC) 5 MG tablet Take 2 tablets (10 mg total) by mouth daily. 60 tablet 1   BYSTOLIC 10 MG tablet Take 1 tablet (10 mg total) by mouth daily. 90 tablet 0   Calcium Carbonate Antacid (TUMS CHEWY BITES PO) Take 1 tablet by mouth daily as needed (acid reflux).     cetirizine (ZYRTEC) 10 MG tablet  Take 10 mg by mouth daily as needed for allergies.      EPINEPHrine 0.3 mg/0.3 mL IJ SOAJ injection Inject 0.3 mg into the muscle once as needed (for anaphylaxis).  1   ibuprofen (ADVIL) 600 MG tablet Take 1 tablet (600 mg total) by mouth every 6 (six) hours as needed. 30 tablet 0   LORazepam (ATIVAN) 0.5 MG tablet Take 0.5 mg by mouth daily as needed for anxiety.     mometasone (ELOCON) 0.1 % cream Apply 1 application topically daily.     Pyridoxine HCl (VITAMIN B6) 50 MG TABS 1 tablet     No current facility-administered medications for this visit.    REVIEW OF SYSTEMS:  '[X]'$  denotes positive finding, '[ ]'$  denotes negative finding Cardiac  Comments:  Chest pain or chest pressure:    Shortness of breath upon exertion:    Short of breath when lying flat:    Irregular heart rhythm:        Vascular    Pain in calf, thigh, or hip brought on by ambulation:    Pain in feet at night that wakes you up from your sleep:     Blood clot in your veins:    Leg swelling:         Pulmonary    Oxygen at home:    Productive cough:     Wheezing:         Neurologic    Sudden weakness in arms or legs:     Sudden numbness in arms or legs:     Sudden onset of difficulty speaking or slurred speech:    Temporary loss of vision in one eye:     Problems with dizziness:         Gastrointestinal    Blood in stool:     Vomited blood:         Genitourinary    Burning when urinating:     Blood in urine:         Psychiatric    Major depression:         Hematologic    Bleeding problems:    Problems with blood clotting too easily:        Skin    Rashes or ulcers:        Constitutional    Fever or chills:    -  PHYSICAL EXAM:   Vitals:   05/16/22 1442 05/16/22 1445  BP: (!) 175/74 (!) 200/80  Pulse: (!) 48   Resp: 18   Temp: 98.3 F (36.8 C)   SpO2: 98%   Weight: 120 lb (54.4 kg)   Height: '5\' 2"'$  (1.575 m)    Body mass index is 21.95 kg/m. GENERAL: The patient is a well-nourished female, in no acute distress. The vital signs are documented above. CARDIAC: There is a regular rate and rhythm.  VASCULAR: I do not detect carotid bruits. On the right side, she has a palpable femoral pulse.  I cannot palpate a popliteal pulse or pedal pulses.  The right foot has good temperature and is adequately perfused.  She does have a monophasic anterior tibial signal and posterior tibial signal with the Doppler. On the left side she has a palpable femoral, popliteal, and dorsalis pedis signal.  She has a brisk posterior tibial and dorsalis pedis signal with the Doppler. PULMONARY: There is good air exchange bilaterally without wheezing or rales. ABDOMEN: Soft and non-tender with normal pitched bowel sounds.  I do not palpate any  aneurysm. MUSCULOSKELETAL: There are no major deformities. NEUROLOGIC: No focal weakness or paresthesias are detected. SKIN: There are no ulcers or rashes noted. PSYCHIATRIC: The patient has a normal affect.  DATA:    CAROTID DUPLEX: I have independently interpreted her carotid duplex scan today.  On the right side there is no significant carotid stenosis.  The right vertebral artery is patent with antegrade flow.  On the left side there is a 40 to 59% stenosis.  The plaque is heterogeneous and calcific and is located at the bifurcation and proximal ICA.  Thus our peak systolic and end-diastolic velocities are slightly lower compared to the study that was done at  [place] on 04/23/2022.  MRI BRAIN: I did review the MRI of the brain that was done on 04/28/2022.  This showed no acute intracranial or orbital pathology.  CAROTID DUPLEX: I did review the carotid duplex scan that was done at [facility] on 04/23/2022.  On the right side there was no significant plaque noted and no significant stenosis.  On the left side peak systolic velocity was 211 cm/s with an end-diastolic velocity of 60 cm/s.  This was interpreted as showing a 70 to 99% left carotid stenosis.  LABS: I reviewed the labs from 04/08/2022.  At that time the platelet count was 125,000.  Deitra [facility]

## 2022-05-18 ENCOUNTER — Other Ambulatory Visit: Payer: Self-pay | Admitting: Cardiology

## 2022-05-18 DIAGNOSIS — I2510 Atherosclerotic heart disease of native coronary artery without angina pectoris: Secondary | ICD-10-CM

## 2022-05-18 DIAGNOSIS — E782 Mixed hyperlipidemia: Secondary | ICD-10-CM

## 2022-05-21 ENCOUNTER — Other Ambulatory Visit: Payer: Self-pay | Admitting: Student

## 2022-05-21 ENCOUNTER — Other Ambulatory Visit: Payer: Self-pay | Admitting: *Deleted

## 2022-05-21 DIAGNOSIS — I10 Essential (primary) hypertension: Secondary | ICD-10-CM

## 2022-05-21 DIAGNOSIS — I6529 Occlusion and stenosis of unspecified carotid artery: Secondary | ICD-10-CM

## 2022-05-22 ENCOUNTER — Other Ambulatory Visit: Payer: Self-pay | Admitting: Cardiology

## 2022-06-19 ENCOUNTER — Other Ambulatory Visit: Payer: Self-pay | Admitting: Student

## 2022-06-19 DIAGNOSIS — I10 Essential (primary) hypertension: Secondary | ICD-10-CM

## 2022-07-01 DIAGNOSIS — M545 Low back pain, unspecified: Secondary | ICD-10-CM | POA: Diagnosis not present

## 2022-07-01 DIAGNOSIS — I10 Essential (primary) hypertension: Secondary | ICD-10-CM | POA: Diagnosis not present

## 2022-07-01 DIAGNOSIS — F411 Generalized anxiety disorder: Secondary | ICD-10-CM | POA: Diagnosis not present

## 2022-07-04 ENCOUNTER — Ambulatory Visit: Payer: Medicare HMO | Admitting: Cardiology

## 2022-07-04 ENCOUNTER — Encounter: Payer: Self-pay | Admitting: Cardiology

## 2022-07-04 ENCOUNTER — Ambulatory Visit: Payer: Medicare HMO | Admitting: Student

## 2022-07-04 VITALS — BP 183/72 | HR 60 | Temp 97.3°F | Resp 16 | Ht 62.0 in | Wt 121.2 lb

## 2022-07-04 DIAGNOSIS — I10 Essential (primary) hypertension: Secondary | ICD-10-CM | POA: Diagnosis not present

## 2022-07-04 NOTE — Progress Notes (Signed)
Patient is here for follow up visit.  Subjective:   '@Patient'$  ID: Donna Baldwin, female    DOB: 10/17/1941, 81 y.o.   MRN: [id number]  Chief Complaint  Patient presents with   Coronary Artery Disease   Hypertension   Hyperlipidemia   Follow-up    6 months    81 year-old African-American female with labile hypertension, type II diabetes mellitus, chronic dysphagia, interstitial cystitis.  Patient is here today with her daughter.  She has episodes of sudden increase in blood pressure, that is associated with generalized cramp-like feeling.  Other than these high spikes of blood pressure, blood pressure is well controlled.  She denies any chest pain, shortness of breath symptoms.   Current Outpatient Medications:    amLODipine (NORVASC) 5 MG tablet, TAKE 2 TABLETS BY MOUTH EVERY DAY, Disp: 60 tablet, Rfl: 1   BYSTOLIC 10 MG tablet, Take 1 tablet (10 mg total) by mouth daily., Disp: 90 tablet, Rfl: 0   Calcium Carbonate Antacid (TUMS CHEWY BITES PO), Take 1 tablet by mouth daily as needed (acid reflux)., Disp: , Rfl:    cetirizine (ZYRTEC) 10 MG tablet, Take 10 mg by mouth daily as needed for allergies. , Disp: , Rfl:    EPINEPHrine 0.3 mg/0.3 mL IJ SOAJ injection, Inject 0.3 mg into the muscle once as needed (for anaphylaxis)., Disp: , Rfl: 1   ibuprofen (ADVIL) 600 MG tablet, Take 1 tablet (600 mg total) by mouth every 6 (six) hours as needed., Disp: 30 tablet, Rfl: 0   LORazepam (ATIVAN) 0.5 MG tablet, Take 0.5 mg by mouth daily as needed for anxiety., Disp: , Rfl:    mometasone (ELOCON) 0.1 % cream, Apply 1 application topically daily., Disp: , Rfl:    Pyridoxine HCl (VITAMIN B6) 50 MG TABS, 1 tablet, Disp: , Rfl:    REPATHA 140 MG/ML SOSY, INJECT 140 MG INTO THE SKIN EVERY 14 (FOURTEEN) DAYS., Disp: 2 mL, Rfl: 3  Cardiovascular studies:  EKG 07/04/2022: Sinus rhythm 61 bpm Normal EKG  Echocardiogram 07/04/2021:  Left ventricle cavity is normal in size and wall thickness.  Normal global  wall motion. Normal LV systolic function with EF 54%. Doppler evidence of  grade II (pseudonormal) diastolic dysfunction, elevated LAP. Calculated EF  54%.  Left atrial cavity is mildly dilated.  Mild (Grade I) mitral regurgitation.  Mild tricuspid regurgitation.  No evidence of pulmonary hypertension.  EKG 09/14/2020: Sinus bradycardia 44 bpm  Nonspecific ST-T changes  CTA 12/08/2019: 1. Coronary artery calcium score 108 Agatston units. This places the patient in the 54th percentile for age and gender, suggesting intermediate risk for future cardiac events. 2.  Nonobstructive ostial left main calcified plaque.  Upper Abdomen: Scattered foci of hepatic dome arterial hyperenhancement are diminutive and likely small perfusion anomalies. Normal imaged portions of the spleen, stomach.   Musculoskeletal: Thoracic spondylosis.   IMPRESSION: 1.  No acute findings in the imaged extracardiac chest. 2.  Aortic Atherosclerosis (ICD10-I70.0). 3. Esophageal air fluid level suggests dysmotility or gastroesophageal reflux.   Recent labs: 11/2021: Chol 256, TG 67, HDL 86, LDL 159    Review of Systems  Cardiovascular:  Negative for chest pain, dyspnea on exertion, leg swelling, palpitations and syncope.       Objective:     Vitals:   07/04/22 1416  BP: (!) 183/72  Pulse: 60  Resp: 16  Temp: (!) 97.3 F (36.3 C)  SpO2: 98%     Physical Exam Vitals and nursing note reviewed.  Constitutional:      General: She is not in acute distress.    Appearance: She is underweight.  Neck:     Vascular: No JVD.  Cardiovascular:     Rate and Rhythm: Normal rate and regular rhythm.     Heart sounds: Normal heart sounds. No murmur heard. Pulmonary:     Effort: Pulmonary effort is normal.     Breath sounds: Normal breath sounds. No wheezing or rales.  Musculoskeletal:     Right lower leg: No edema.     Left lower leg: No edema.         Assessment & Recommendations:     81 year-old African-American female with labile hypertension, type II diabetes mellitus, chronic dysphagia, interstitial cystitis.  Essential hypertension: Labile hypertension.  Added hydralazine 25 mg 3 times daily as needed for spikes of blood pressure.   Continue nebivolol and amlodipine  Hyperlipidemia: Statin intolerant.  We will discuss more regarding lipid management at next visit.  F/u in 4 weeks   [place], MD [facility] Pager: [phone] Office: [phone] If no answer Cell [phone]

## 2022-07-05 ENCOUNTER — Encounter: Payer: Self-pay | Admitting: Cardiology

## 2022-07-09 ENCOUNTER — Inpatient Hospital Stay: Payer: Medicare HMO | Attending: Internal Medicine

## 2022-07-09 ENCOUNTER — Inpatient Hospital Stay: Payer: Medicare HMO | Admitting: Internal Medicine

## 2022-07-09 ENCOUNTER — Other Ambulatory Visit: Payer: Self-pay

## 2022-07-09 VITALS — BP 160/70 | HR 56 | Temp 97.0°F | Resp 18 | Wt 121.3 lb

## 2022-07-09 DIAGNOSIS — I119 Hypertensive heart disease without heart failure: Secondary | ICD-10-CM | POA: Insufficient documentation

## 2022-07-09 DIAGNOSIS — I2510 Atherosclerotic heart disease of native coronary artery without angina pectoris: Secondary | ICD-10-CM | POA: Diagnosis not present

## 2022-07-09 DIAGNOSIS — D696 Thrombocytopenia, unspecified: Secondary | ICD-10-CM

## 2022-07-09 DIAGNOSIS — D539 Nutritional anemia, unspecified: Secondary | ICD-10-CM | POA: Diagnosis not present

## 2022-07-09 DIAGNOSIS — D509 Iron deficiency anemia, unspecified: Secondary | ICD-10-CM

## 2022-07-09 LAB — CBC WITH DIFFERENTIAL (CANCER CENTER ONLY)
Abs Immature Granulocytes: 0 10*3/uL (ref 0.00–0.07)
Basophils Absolute: 0 10*3/uL (ref 0.0–0.1)
Basophils Relative: 1 %
Eosinophils Absolute: 0 10*3/uL (ref 0.0–0.5)
Eosinophils Relative: 0 %
HCT: 37.1 % (ref 36.0–46.0)
Hemoglobin: 11.5 g/dL — ABNORMAL LOW (ref 12.0–15.0)
Immature Granulocytes: 0 %
Lymphocytes Relative: 30 %
Lymphs Abs: 1 10*3/uL (ref 0.7–4.0)
MCH: 22.9 pg — ABNORMAL LOW (ref 26.0–34.0)
MCHC: 31 g/dL (ref 30.0–36.0)
MCV: 73.8 fL — ABNORMAL LOW (ref 80.0–100.0)
Monocytes Absolute: 0.3 10*3/uL (ref 0.1–1.0)
Monocytes Relative: 10 %
Neutro Abs: 2.1 10*3/uL (ref 1.7–7.7)
Neutrophils Relative %: 59 %
Platelet Count: 144 10*3/uL — ABNORMAL LOW (ref 150–400)
RBC: 5.03 MIL/uL (ref 3.87–5.11)
RDW: 14.7 % (ref 11.5–15.5)
WBC Count: 3.5 10*3/uL — ABNORMAL LOW (ref 4.0–10.5)
nRBC: 0 % (ref 0.0–0.2)

## 2022-07-09 LAB — FERRITIN: Ferritin: 85 ng/mL (ref 11–307)

## 2022-07-09 LAB — IRON AND IRON BINDING CAPACITY (CC-WL,HP ONLY)
Iron: 108 ug/dL (ref 28–170)
Saturation Ratios: 32 % — ABNORMAL HIGH (ref 10.4–31.8)
TIBC: 342 ug/dL (ref 250–450)
UIBC: 234 ug/dL (ref 148–442)

## 2022-07-09 LAB — VITAMIN B12: Vitamin B-12: 740 pg/mL (ref 180–914)

## 2022-07-09 NOTE — Progress Notes (Signed)
[place] [phone]   [phone]  OFFICE PROGRESS NOTE  Harlan Stains, MD [place] [address]  DIAGNOSIS:  1) Thrombocytopenia likely ITP. 2) microcytic anemia of unclear etiology.   PRIOR THERAPY: None  CURRENT THERAPY: Multivitamins with vitamin B12 supplements.  INTERVAL HISTORY: Donna Baldwin 81 y.o. female returns to the clinic today for follow-up visit accompanied by her husband.  The patient is feeling fine today with no concerning complaints.  She denied having any fatigue or weakness.  She denied having any nausea, vomiting, diarrhea or constipation.  She has no headache or visual changes.  She denied having any significant weight loss or night sweats.  She has no bleeding, bruises or ecchymosis.  She is currently on multivitamins as well as vitamin B12 tablets.  She is here today for evaluation and repeat blood work.  MEDICAL HISTORY: Past Medical History:  Diagnosis Date   Acid reflux    Anemia    Anxiety    Atherosclerosis    Back pain    Coronary artery disease    GERD (gastroesophageal reflux disease)    HTN (hypertension) 12/03/2013   CT scan-no pheochromocytoma   Hyperlipidemia    Hypertension    Hypertensive heart disease without heart failure    Thrombocytopenia (HCC)    Vitamin D deficiency     ALLERGIES:  is allergic to acetaminophen; beef allergy; beef-derived products; codeine; hydralazine; iodine; ivp dye [iodinated contrast media]; latex; morphine and related; penicillins; sertraline; shellfish allergy; solu-medrol [methylprednisolone]; sulfa antibiotics; barbiturates; bee pollen; bee venom; diovan [valsartan]; gabapentin; livalo [pitavastatin]; other; oxycodone; promethazine-dm; tape; wellness essentials blood sugr [nutritional supplements]; albuterol; ciprofloxacin; crestor [rosuvastatin]; famotidine; pred forte [prednisolone acetate]; welchol [colesevelam]; celebrex  [celecoxib]; lanolin; mobic [meloxicam]; nickel; nitrates, organic; petrolatum; soy allergy; and strawberry extract.  MEDICATIONS:  Current Outpatient Medications  Medication Sig Dispense Refill   amLODipine (NORVASC) 5 MG tablet TAKE 2 TABLETS BY MOUTH EVERY DAY 60 tablet 1   BYSTOLIC 10 MG tablet Take 1 tablet (10 mg total) by mouth daily. 90 tablet 0   Calcium Carbonate Antacid (TUMS CHEWY BITES PO) Take 1 tablet by mouth daily as needed (acid reflux).     cetirizine (ZYRTEC) 10 MG tablet Take 10 mg by mouth daily as needed for allergies.      EPINEPHrine 0.3 mg/0.3 mL IJ SOAJ injection Inject 0.3 mg into the muscle once as needed (for anaphylaxis).  1   ibuprofen (ADVIL) 600 MG tablet Take 1 tablet (600 mg total) by mouth every 6 (six) hours as needed. 30 tablet 0   LORazepam (ATIVAN) 0.5 MG tablet Take 0.5 mg by mouth daily as needed for anxiety.     mometasone (ELOCON) 0.1 % cream Apply 1 application topically daily.     Pyridoxine HCl (VITAMIN B6) 50 MG TABS 1 tablet     REPATHA 140 MG/ML SOSY INJECT 140 MG INTO THE SKIN EVERY 14 (FOURTEEN) DAYS. (Patient not taking: Reported on 07/04/2022) 2 mL 3   tiZANidine (ZANAFLEX) 2 MG tablet Take 2 mg by mouth 3 (three) times daily.     No current facility-administered medications for this visit.    SURGICAL HISTORY:  Past Surgical History:  Procedure Laterality Date   ESOPHAGEAL MANOMETRY N/A 03/15/2020   Procedure: ESOPHAGEAL MANOMETRY (EM);  Surgeon: Wilford Corner, MD;  Location: [facility];  Service: Endoscopy;  Laterality: N/A;   EYE SURGERY     HELLER MYOTOMY N/A  04/18/2020   Procedure: LAPAROSCOPIC HELLER MYOTOMY WITH UPPER ENDOSCOPY;  Surgeon: Kieth Brightly, Arta Bruce, MD;  Location: [facility];  Service: General;  Laterality: N/A;   [place] IMPEDANCE STUDY N/A 03/15/2020   Procedure: [place] IMPEDANCE STUDY;  Surgeon: Wilford Corner, MD;  Location: [facility];  Service: Endoscopy;  Laterality: N/A;   SHOULDER SURGERY Bilateral     TONSILLECTOMY      REVIEW OF SYSTEMS:  A comprehensive review of systems was negative.   PHYSICAL EXAMINATION: General appearance: alert, cooperative, and no distress Head: Normocephalic, without obvious abnormality, atraumatic Neck: no adenopathy, no JVD, supple, symmetrical, trachea midline, and thyroid not enlarged, symmetric, no tenderness/mass/nodules Lymph nodes: Cervical, supraclavicular, and axillary nodes normal. Resp: clear to auscultation bilaterally Back: symmetric, no curvature. ROM normal. No CVA tenderness. Cardio: regular rate and rhythm, S1, S2 normal, no murmur, click, rub or gallop GI: soft, non-tender; bowel sounds normal; no masses,  no organomegaly Extremities: extremities normal, atraumatic, no cyanosis or edema  ECOG PERFORMANCE STATUS: 1 - Symptomatic but completely ambulatory  Blood pressure (!) 160/70, pulse (!) 56, temperature (!) 97 F (36.1 C), temperature source Tympanic, resp. rate 18, weight 121 lb 5 oz (55 kg), SpO2 100 %.  LABORATORY DATA: Lab Results  Component Value Date   WBC 3.5 (L) 07/09/2022   HGB 11.5 (L) 07/09/2022   HCT 37.1 07/09/2022   MCV 73.8 (L) 07/09/2022   PLT 144 (L) 07/09/2022      Chemistry      Component Value Date/Time   NA 144 04/11/2021 1300   NA 144 11/24/2019 0851   K 4.1 04/11/2021 1300   CL 105 04/11/2021 1300   CO2 28 04/11/2021 1300   BUN 18 04/11/2021 1300   BUN 13 11/24/2019 0851   CREATININE 0.82 04/11/2021 1300   CREATININE 0.87 04/03/2017 1059      Component Value Date/Time   CALCIUM 9.8 04/11/2021 1300   ALKPHOS 95 04/11/2021 1300   AST 21 04/11/2021 1300   ALT 12 04/11/2021 1300   BILITOT 0.4 04/11/2021 1300       RADIOGRAPHIC STUDIES: No results found.  ASSESSMENT AND PLAN: This is a very pleasant 81 years old white female with mild thrombocytopenia likely ITP in addition to mild microcytic anemia.  The patient underwent hemoglobin electrophoresis that showed normal pattern.  Her iron study  was also unremarkable Repeat CBC today showed improvement of her total white blood count as well as platelet count in addition to a stable hemoglobin of 11.5. I recommended for the patient to continue her current treatment with the multivitamins and vitamin B12 supplements. I will see her back for follow-up visit in 6 months for evaluation with repeat blood work. The patient was advised to call immediately if she has any other concerning symptoms in the interval. The patient voices understanding of current disease status and treatment options and is in agreement with the current care plan.  All questions were answered. The patient knows to call the clinic with any problems, questions or concerns. We can certainly see the patient much sooner if necessary.  Disclaimer: This note was dictated with voice recognition software. Similar sounding words can inadvertently be transcribed and may not be corrected upon review.

## 2022-07-11 ENCOUNTER — Telehealth: Payer: Self-pay | Admitting: Allergy & Immunology

## 2022-07-11 DIAGNOSIS — Z9189 Other specified personal risk factors, not elsewhere classified: Secondary | ICD-10-CM

## 2022-07-11 NOTE — Telephone Encounter (Signed)
I called Donna Baldwin and LVM asking her to give [place] a call back about a clinic issue.   Salvatore Marvel, MD [facility] [facility]

## 2022-07-25 ENCOUNTER — Telehealth: Payer: Self-pay | Admitting: Internal Medicine

## 2022-07-25 ENCOUNTER — Other Ambulatory Visit: Payer: Self-pay | Admitting: Student

## 2022-07-25 DIAGNOSIS — I10 Essential (primary) hypertension: Secondary | ICD-10-CM

## 2022-07-25 NOTE — Telephone Encounter (Signed)
Called patient regarding upcoming January appointment, patient has been called and notified.

## 2022-08-02 ENCOUNTER — Other Ambulatory Visit: Payer: Self-pay | Admitting: Student

## 2022-08-05 ENCOUNTER — Other Ambulatory Visit: Payer: Self-pay

## 2022-08-05 ENCOUNTER — Observation Stay (HOSPITAL_COMMUNITY)
Admission: EM | Admit: 2022-08-05 | Discharge: 2022-08-06 | Disposition: A | Discharge status: Home / Self Care | Payer: Medicare HMO | Attending: Internal Medicine | Admitting: Internal Medicine

## 2022-08-05 ENCOUNTER — Emergency Department (HOSPITAL_COMMUNITY): Payer: Medicare HMO

## 2022-08-05 ENCOUNTER — Encounter (HOSPITAL_COMMUNITY): Payer: Self-pay | Admitting: Emergency Medicine

## 2022-08-05 DIAGNOSIS — I6522 Occlusion and stenosis of left carotid artery: Secondary | ICD-10-CM | POA: Diagnosis not present

## 2022-08-05 DIAGNOSIS — I119 Hypertensive heart disease without heart failure: Secondary | ICD-10-CM | POA: Insufficient documentation

## 2022-08-05 DIAGNOSIS — Z79899 Other long term (current) drug therapy: Secondary | ICD-10-CM | POA: Diagnosis not present

## 2022-08-05 DIAGNOSIS — R079 Chest pain, unspecified: Secondary | ICD-10-CM | POA: Diagnosis not present

## 2022-08-05 DIAGNOSIS — I160 Hypertensive urgency: Secondary | ICD-10-CM | POA: Diagnosis not present

## 2022-08-05 DIAGNOSIS — E559 Vitamin D deficiency, unspecified: Secondary | ICD-10-CM | POA: Insufficient documentation

## 2022-08-05 DIAGNOSIS — R001 Bradycardia, unspecified: Secondary | ICD-10-CM

## 2022-08-05 DIAGNOSIS — Z7982 Long term (current) use of aspirin: Secondary | ICD-10-CM | POA: Diagnosis not present

## 2022-08-05 DIAGNOSIS — I2510 Atherosclerotic heart disease of native coronary artery without angina pectoris: Secondary | ICD-10-CM | POA: Diagnosis not present

## 2022-08-05 DIAGNOSIS — R7989 Other specified abnormal findings of blood chemistry: Secondary | ICD-10-CM

## 2022-08-05 DIAGNOSIS — D696 Thrombocytopenia, unspecified: Secondary | ICD-10-CM | POA: Diagnosis not present

## 2022-08-05 DIAGNOSIS — R778 Other specified abnormalities of plasma proteins: Secondary | ICD-10-CM | POA: Diagnosis not present

## 2022-08-05 DIAGNOSIS — Z91040 Latex allergy status: Secondary | ICD-10-CM | POA: Insufficient documentation

## 2022-08-05 DIAGNOSIS — E7800 Pure hypercholesterolemia, unspecified: Secondary | ICD-10-CM | POA: Diagnosis not present

## 2022-08-05 DIAGNOSIS — I10 Essential (primary) hypertension: Secondary | ICD-10-CM | POA: Diagnosis not present

## 2022-08-05 DIAGNOSIS — R519 Headache, unspecified: Secondary | ICD-10-CM | POA: Diagnosis not present

## 2022-08-05 DIAGNOSIS — R0789 Other chest pain: Principal | ICD-10-CM | POA: Insufficient documentation

## 2022-08-05 LAB — CBC
HCT: 40.5 % (ref 36.0–46.0)
Hemoglobin: 12.2 g/dL (ref 12.0–15.0)
MCH: 23.1 pg — ABNORMAL LOW (ref 26.0–34.0)
MCHC: 30.1 g/dL (ref 30.0–36.0)
MCV: 76.9 fL — ABNORMAL LOW (ref 80.0–100.0)
Platelets: 145 10*3/uL — ABNORMAL LOW (ref 150–400)
RBC: 5.27 MIL/uL — ABNORMAL HIGH (ref 3.87–5.11)
RDW: 15.5 % (ref 11.5–15.5)
WBC: 4.9 10*3/uL (ref 4.0–10.5)
nRBC: 0 % (ref 0.0–0.2)

## 2022-08-05 LAB — BASIC METABOLIC PANEL
Anion gap: 6 (ref 5–15)
BUN: 13 mg/dL (ref 8–23)
CO2: 26 mmol/L (ref 22–32)
Calcium: 9.6 mg/dL (ref 8.9–10.3)
Chloride: 108 mmol/L (ref 98–111)
Creatinine, Ser: 0.87 mg/dL (ref 0.44–1.00)
GFR, Estimated: >60 mL/min (ref >60)
Glucose, Bld: 117 mg/dL — ABNORMAL HIGH (ref 70–99)
Potassium: 4 mmol/L (ref 3.5–5.1)
Sodium: 140 mmol/L (ref 135–145)

## 2022-08-05 LAB — TROPONIN I (HIGH SENSITIVITY)
Troponin I (High Sensitivity): 10 ng/L (ref <18)
Troponin I (High Sensitivity): 66 ng/L — ABNORMAL HIGH (ref <18)

## 2022-08-05 NOTE — ED Provider Triage Note (Signed)
Emergency Medicine Provider Triage Evaluation Note  Donna Baldwin , a 81 y.o. female  was evaluated in triage.  Pt complains of chest pain, shortness of breath, near syncopal episode, hypertension.  Patient states that she woke up this morning with a headache which is now resolved after taking aspirin.  She states that later in the morning she started feeling strange and checked her blood pressure and noticed that it was over 200/100.  She also began having pain on the left side of her chest that is described as sharp.  She states that she started to have mild shortness of breath.  She denies nausea, vomiting, abdominal pain.  She does take blood pressure medications but has not had any medications today  Review of Systems  Positive: Pain, shortness of breath, hypertension Negative: Abdominal pain, nausea  Physical Exam  BP (!) 203/89   Pulse 78   Temp 97.7 F (36.5 C) (Oral)   Resp 15   SpO2 100%  Gen:   Awake, no distress   Resp:  Normal effort  MSK:   Moves extremities without difficulty  Other:    Medical Decision Making  Medically screening exam initiated at 2:13 PM.  Appropriate orders placed.  Donna Baldwin was informed that the remainder of the evaluation will be completed by another provider, this initial triage assessment does not replace that evaluation, and the importance of remaining in the ED until their evaluation is complete.     Dorothyann Peng, PA-C 08/05/22 1414

## 2022-08-05 NOTE — Discharge Instructions (Signed)
Please call your cardiologist office first thing in the morning to set up an appointment. Please resume taking your blood medications as directed.

## 2022-08-05 NOTE — ED Provider Notes (Signed)
[facility] EMERGENCY DEPARTMENT Provider Note   CSN: [id number] Arrival date & time: 08/05/22  1331     History {Add pertinent medical, surgical, social history, OB history to HPI:1} Chief Complaint  Patient presents with   Hypertension    Donna Baldwin is a 81 y.o. female who presents today for evaluation of headache. She states that yesterday she began developing a posterior headache.  She denies any weakness.  She states that today when she woke up she was still feeling unwell.  She checked her blood pressure and noted that it was elevated in the 892J systolic.  She normally takes her antihypertensives in the morning and did not take them and this morning.  She reports that her heart rate is usually in the 40s even before she takes her blood pressure medications.    She was concerned that today it was up to 103 while she was feeling unwell.   HPI     Home Medications Prior to Admission medications   Medication Sig Start Date End Date Taking? Authorizing Provider  amLODipine (NORVASC) 5 MG tablet TAKE 2 TABLETS BY MOUTH EVERY DAY 07/25/22   Cantwell, Celeste C, PA-C  Calcium Carbonate Antacid (TUMS CHEWY BITES PO) Take 1 tablet by mouth daily as needed (acid reflux).    [provider]  cetirizine (ZYRTEC) 10 MG tablet Take 10 mg by mouth daily as needed for allergies.     [provider]  EPINEPHrine 0.3 mg/0.3 mL IJ SOAJ injection Inject 0.3 mg into the muscle once as needed (for anaphylaxis). 05/31/16   [provider]  ibuprofen (ADVIL) 600 MG tablet Take 1 tablet (600 mg total) by mouth every 6 (six) hours as needed. 04/20/20   Kinsinger, Arta Bruce, MD  LORazepam (ATIVAN) 0.5 MG tablet Take 0.5 mg by mouth daily as needed for anxiety.    [provider]  mometasone (ELOCON) 0.1 % cream Apply 1 application topically daily.    [provider]  nebivolol (BYSTOLIC) 10 MG tablet TAKE 1 TABLET BY MOUTH EVERY DAY  (PT REQUEST GEN THIS TIME) 08/02/22   Patwardhan, Reynold Bowen, MD  Pyridoxine HCl (VITAMIN B6) 50 MG TABS 1 tablet    [provider]  REPATHA 140 MG/ML SOSY INJECT 140 MG INTO THE SKIN EVERY 14 (FOURTEEN) DAYS. Patient not taking: Reported on 07/04/2022 05/22/22   Cantwell, Anderson Malta C, PA-C  tiZANidine (ZANAFLEX) 2 MG tablet Take 2 mg by mouth 3 (three) times daily. 07/01/22   [provider]      Allergies    Acetaminophen; Beef allergy; Beef-derived products; Codeine; Hydralazine; Iodine; Ivp dye [iodinated contrast media]; Latex; Morphine and related; Penicillins; Sertraline; Shellfish allergy; Solu-medrol [methylprednisolone]; Sulfa antibiotics; Barbiturates; Bee pollen; Bee venom; Diovan [valsartan]; Gabapentin; Livalo [pitavastatin]; Other; Oxycodone; Promethazine-dm; Tape; Wellness essentials blood sugr [nutritional supplements]; Albuterol; Ciprofloxacin; Crestor [rosuvastatin]; Famotidine; Pred forte [prednisolone acetate]; Welchol [colesevelam]; Celebrex [celecoxib]; Lanolin; Mobic [meloxicam]; Nickel; Nitrates, organic; Petrolatum; Soy allergy; and Strawberry extract    Review of Systems   Review of Systems  Physical Exam Updated Vital Signs BP (!) 169/69   Pulse (!) 41   Temp 98 F (36.7 C)   Resp 14   SpO2 100%  Physical Exam Vitals and nursing note reviewed.  Constitutional:      General: She is not in acute distress.    Appearance: Normal appearance. She is not ill-appearing.  HENT:     Head: Normocephalic and atraumatic.     Mouth/Throat:  Mouth: Mucous membranes are moist.  Eyes:     Conjunctiva/sclera: Conjunctivae normal.  Cardiovascular:     Rate and Rhythm: Bradycardia present. Rhythm irregular.     Heart sounds: Normal heart sounds.  Pulmonary:     Effort: Pulmonary effort is normal. No respiratory distress.  Abdominal:     Tenderness: There is no abdominal tenderness.  Musculoskeletal:     Cervical back: Normal range of motion and neck supple.  No tenderness.     Right lower leg: No edema.     Left lower leg: No edema.  Skin:    General: Skin is warm and dry.  Neurological:     Mental Status: She is alert.     Comments: Patient is awake and alert, answers questions appropriately without difficulty.  Speech is not slurred.  5/5 strength bilateral arms and legs.  Facial movements are grossly symmetric.  Psychiatric:        Mood and Affect: Mood normal.        Behavior: Behavior normal.     ED Results / Procedures / Treatments   Labs (all labs ordered are listed, but only abnormal results are displayed) Labs Reviewed  BASIC METABOLIC PANEL - Abnormal; Notable for the following components:      Result Value   Glucose, Bld 117 (*)    All other components within normal limits  CBC - Abnormal; Notable for the following components:   RBC 5.27 (*)    MCV 76.9 (*)    MCH 23.1 (*)    Platelets 145 (*)    All other components within normal limits  TROPONIN I (HIGH SENSITIVITY)  TROPONIN I (HIGH SENSITIVITY)    EKG None  Radiology DG Chest 2 View  Result Date: 08/05/2022 CLINICAL DATA:  Chest pain EXAM: CHEST - 2 VIEW COMPARISON:  09/14/2020 FINDINGS: Cardiac size is within normal limits. Increase in AP diameter of chest suggests possible COPD. Lung fields are clear of any infiltrate or pulmonary edema. There is no pleural effusion or pneumothorax. IMPRESSION: There are no new infiltrates or signs of pulmonary edema. Electronically Signed   By: Elmer Picker M.D.   On: 08/05/2022 14:50    Procedures Procedures  {Document cardiac monitor, telemetry assessment procedure when appropriate:1}  Medications Ordered in ED Medications - No data to display  ED Course/ Medical Decision Making/ A&P                           Medical Decision Making Amount and/or Complexity of Data Reviewed Labs: ordered. Radiology: ordered.   ***  {Document critical care time when appropriate:1} {Document review of labs and clinical  decision tools ie heart score, Chads2Vasc2 etc:1}  {Document your independent review of radiology images, and any outside records:1} {Document your discussion with family members, caretakers, and with consultants:1} {Document social determinants of health affecting pt's care:1} {Document your decision making why or why not admission, treatments were needed:1} Final Clinical Impression(s) / ED Diagnoses Final diagnoses:  None    Rx / DC Orders ED Discharge Orders     None

## 2022-08-05 NOTE — ED Notes (Signed)
Back from CT

## 2022-08-05 NOTE — ED Triage Notes (Signed)
Patient sent to ED for evaluation of hypertension and headache for which patient took at aspirin which resolved her headache, pressure was 840 systolic at home, 375/43 in triage. Patient has history of hypertension, patient has not taken antihypertensive medication yet today.

## 2022-08-05 NOTE — ED Notes (Signed)
To CT

## 2022-08-06 ENCOUNTER — Telehealth: Payer: Self-pay | Admitting: Cardiology

## 2022-08-06 ENCOUNTER — Encounter (HOSPITAL_COMMUNITY): Payer: Self-pay | Admitting: Internal Medicine

## 2022-08-06 DIAGNOSIS — I160 Hypertensive urgency: Secondary | ICD-10-CM | POA: Diagnosis not present

## 2022-08-06 DIAGNOSIS — D696 Thrombocytopenia, unspecified: Secondary | ICD-10-CM | POA: Diagnosis not present

## 2022-08-06 DIAGNOSIS — R001 Bradycardia, unspecified: Secondary | ICD-10-CM | POA: Diagnosis not present

## 2022-08-06 DIAGNOSIS — I10 Essential (primary) hypertension: Secondary | ICD-10-CM | POA: Diagnosis not present

## 2022-08-06 DIAGNOSIS — E7800 Pure hypercholesterolemia, unspecified: Secondary | ICD-10-CM | POA: Diagnosis not present

## 2022-08-06 DIAGNOSIS — R079 Chest pain, unspecified: Secondary | ICD-10-CM

## 2022-08-06 DIAGNOSIS — I6522 Occlusion and stenosis of left carotid artery: Secondary | ICD-10-CM | POA: Diagnosis not present

## 2022-08-06 LAB — BASIC METABOLIC PANEL
Anion gap: 7 (ref 5–15)
BUN: 12 mg/dL (ref 8–23)
CO2: 23 mmol/L (ref 22–32)
Calcium: 9 mg/dL (ref 8.9–10.3)
Chloride: 109 mmol/L (ref 98–111)
Creatinine, Ser: 0.72 mg/dL (ref 0.44–1.00)
GFR, Estimated: >60 mL/min (ref >60)
Glucose, Bld: 104 mg/dL — ABNORMAL HIGH (ref 70–99)
Potassium: 3.5 mmol/L (ref 3.5–5.1)
Sodium: 139 mmol/L (ref 135–145)

## 2022-08-06 LAB — TSH: TSH: 5.293 u[IU]/mL — ABNORMAL HIGH (ref 0.350–4.500)

## 2022-08-06 LAB — CBC
HCT: 37.1 % (ref 36.0–46.0)
Hemoglobin: 11.3 g/dL — ABNORMAL LOW (ref 12.0–15.0)
MCH: 22.7 pg — ABNORMAL LOW (ref 26.0–34.0)
MCHC: 30.5 g/dL (ref 30.0–36.0)
MCV: 74.5 fL — ABNORMAL LOW (ref 80.0–100.0)
Platelets: 142 10*3/uL — ABNORMAL LOW (ref 150–400)
RBC: 4.98 MIL/uL (ref 3.87–5.11)
RDW: 15.1 % (ref 11.5–15.5)
WBC: 3.4 10*3/uL — ABNORMAL LOW (ref 4.0–10.5)
nRBC: 0 % (ref 0.0–0.2)

## 2022-08-06 LAB — CREATININE, SERUM
Creatinine, Ser: 0.72 mg/dL (ref 0.44–1.00)
GFR, Estimated: >60 mL/min (ref >60)

## 2022-08-06 LAB — TROPONIN I (HIGH SENSITIVITY): Troponin I (High Sensitivity): 76 ng/L — ABNORMAL HIGH (ref <18)

## 2022-08-06 LAB — CBG MONITORING, ED: Glucose-Capillary: 94 mg/dL (ref 70–99)

## 2022-08-06 LAB — VITAMIN D 25 HYDROXY (VIT D DEFICIENCY, FRACTURES): Vit D, 25-Hydroxy: 21.7 ng/mL — ABNORMAL LOW (ref 30–100)

## 2022-08-06 MED ORDER — EZETIMIBE 10 MG PO TABS
10.0000 mg | ORAL_TABLET | Freq: Every day | ORAL | Status: DC
  Administered 2022-08-06: 10 mg via ORAL
  Filled 2022-08-06: qty 1

## 2022-08-06 MED ORDER — NEBIVOLOL HCL 10 MG PO TABS
10.0000 mg | ORAL_TABLET | Freq: Every day | ORAL | Status: DC
  Filled 2022-08-06: qty 1

## 2022-08-06 MED ORDER — NEBIVOLOL HCL 5 MG PO TABS
5.0000 mg | ORAL_TABLET | Freq: Every day | ORAL | Status: DC
  Filled 2022-08-06: qty 1

## 2022-08-06 MED ORDER — NIFEDIPINE ER OSMOTIC RELEASE 60 MG PO TB24
60.0000 mg | ORAL_TABLET | Freq: Every day | ORAL | Status: DC
  Filled 2022-08-06: qty 1

## 2022-08-06 MED ORDER — HYDRALAZINE HCL 25 MG PO TABS: 25.0000 mg | ORAL_TABLET | ORAL | 2 refills | Status: DC | PRN

## 2022-08-06 MED ORDER — ROSUVASTATIN CALCIUM 5 MG PO TABS
5.0000 mg | ORAL_TABLET | ORAL | Status: DC
Start: 2022-08-06 — End: 2022-08-06
  Administered 2022-08-06: 5 mg via ORAL
  Filled 2022-08-06: qty 1

## 2022-08-06 MED ORDER — AMLODIPINE BESYLATE 5 MG PO TABS
7.5000 mg | ORAL_TABLET | Freq: Every day | ORAL | Status: DC
  Administered 2022-08-06: 7.5 mg via ORAL
  Filled 2022-08-06: qty 2

## 2022-08-06 MED ORDER — EZETIMIBE 10 MG PO TABS: 10.0000 mg | ORAL_TABLET | Freq: Every day | ORAL | 2 refills | Status: DC

## 2022-08-06 MED ORDER — NEBIVOLOL HCL 5 MG PO TABS: 5.0000 mg | ORAL_TABLET | Freq: Every day | ORAL | 2 refills | Status: DC

## 2022-08-06 MED ORDER — ROSUVASTATIN CALCIUM 5 MG PO TABS: 5.0000 mg | ORAL_TABLET | ORAL | 2 refills | Status: DC

## 2022-08-06 MED ORDER — ACETAMINOPHEN 325 MG PO TABS
325.0000 mg | ORAL_TABLET | Freq: Once | ORAL | Status: DC
Start: 2022-08-06 — End: 2022-08-06
  Filled 2022-08-06: qty 1

## 2022-08-06 MED ORDER — ASPIRIN 81 MG PO TBEC
81.0000 mg | DELAYED_RELEASE_TABLET | Freq: Every day | ORAL | Status: DC
  Filled 2022-08-06: qty 1

## 2022-08-06 MED ORDER — NIFEDIPINE ER 60 MG PO TB24: 60.0000 mg | ORAL_TABLET | Freq: Every day | ORAL | 2 refills | Status: DC

## 2022-08-06 MED ORDER — HYDRALAZINE HCL 25 MG PO TABS: 25.0000 mg | ORAL_TABLET | ORAL | Status: DC | PRN

## 2022-08-06 MED ORDER — HEPARIN SODIUM (PORCINE) 5000 UNIT/ML IJ SOLN
5000.0000 [IU] | Freq: Three times a day (TID) | INTRAMUSCULAR | Status: DC
  Administered 2022-08-06: 5000 [IU] via SUBCUTANEOUS
  Filled 2022-08-06: qty 1

## 2022-08-06 NOTE — ED Notes (Signed)
Patient is asleep.  

## 2022-08-06 NOTE — Telephone Encounter (Signed)
Patient seen in the ED, detailed notes done today. Transfer her care to me instead of MP and please enroll her in [facility]  JG

## 2022-08-06 NOTE — ED Notes (Signed)
Per MD Thereasa Solo, pt should be d/c soon. MD asked for pt to not go upstairs. Patient placement notified.

## 2022-08-06 NOTE — Discharge Summary (Signed)
DISCHARGE SUMMARY  Donna Baldwin  MR#: [id number]  DOB:05/31/1941  Date of Admission: 08/05/2022 Date of Discharge: 08/06/2022  Attending Physician:Makendra Vigeant Hennie Duos, MD  Patient's CBJ:SEGBT, Caren Griffins, MD  Consults: Dr. Einar Gip - Cardiology   Disposition: D/C home   Follow-up Appts:  Follow-up Information     Harlan Stains, MD. Schedule an appointment as soon as possible for a visit .   Specialty: Family Medicine Contact information: [address] [place] [address] [phone]         Nigel Mormon, MD. Schedule an appointment as soon as possible for a visit .   Specialties: Cardiology, Radiology Contact information: [address] [place] [address] [place]. Go to .   Specialty: Emergency Medicine Why: As needed, If symptoms worsen Contact information: [address] [id number] [place] [phone]                Discharge Diagnoses: Chest pain -mildly elevated troponin Hypertensive urgency Headache Sinus bradycardia History of ITP Very large number of reported "allergies"  Initial presentation: 81 year old with a history of HTN and HLD who presented to the ER with a 48-hour history of headache which then led to 24 hours of left-sided chest pressure with elevated blood pressure.  In the ER she was found to have a systolic blood pressure of 203 and a heart rate of 40.  EKG revealed sinus bradycardia.  CXR was unremarkable.  CT head was unremarkable.  Hospital Course:  Chest pain -mildly elevated troponin The patient was seen by Dr. Einar Gip in the ED, who knows the patient well. He directed care of her chest pain and HTN. His tx plan was as follows:  81 y.o. female patient with no significant coronary disease but has mildly elevated coronary calcium score with noncritical distal left main minimal  disease by coronary CTA in 2020, difficult to control hypertension, multiple medication allergies and intolerance of statins, achalasia cardia SP surgery in 2019, severe vitamin D deficiency, chronic thrombocytopenia felt to be ITP asymptomatic, presented to the emergency room with elevated blood pressure and sharp chest pain and headache, no significant abnormality on CT head, chest pain felt to be musculoskeletal with negative cardiac markers felt stable for discharge from cardiac standpoint but in view of chest discomfort ED physician requested hospitalist admission.   Patient presently asymptomatic and blood pressure has improved.  She is willing to change her medications and I will follow her up in the outpatient basis and also put her on RPM for evaluation and management of hypertension and hypertensive urgency and resistant hypertension.  Patient has multiple medication intolerances.  In view of underlying bradycardia, I would like to discontinue amlodipine and would like to try Procardia XL 60 mg daily, reduce the dose of Bystolic to 5 mg daily, add hydralazine 25 mg every 4 hours as needed for SBP >140 mmHg.  Although these have been listed as allergies, patient is willing to try it.  She has severe vitamin D deficiency.  We will check vitamin D levels.  It was 18 in April 2023, she was started on 5000 units of vitamin D supplements daily, hence will obtain baseline lab value today, if still very low, she may need prescription dose vitamin D supplements, discussed with her primary physician Dr. Harlan Stains who agrees and she is willing to follow this in the outpatient basis.  As she is already on supplements, I will go ahead and start her on low-dose statins which patient is again willing to try especially in view of asymptomatic carotid disease with high risk of stroke in future.  I will start her on Crestor 5 mg every other day, Zetia 10 mg daily in the evening.  I will slowly titrate this as  tolerated.  From cardiac standpoint she can be discharged home with outpatient follow-up.   Hypertensive urgency Blood pressure well-controlled at time of d/c    Headache Felt to be due to hypertensive urgency -CT head unrevealing - resolved prior to d/c home    Sinus bradycardia Heart rate averaging in the mid 40s during hospital stay - appears to be well-tolerated with preserved/even elevated blood pressure   History of ITP Platelet count stable   Very large number of reported "allergies"    Allergies as of 08/06/2022       Reactions   Beef Allergy Anaphylaxis   Beef-derived Products Anaphylaxis   Codeine Hives, Anxiety   Hydralazine Hives, Shortness Of Breath   Iodine Anaphylaxis   Ivp Dye [iodinated Contrast Media] Anaphylaxis, Other (See Comments)   Latex Rash   Morphine And Related Other (See Comments)   Tremors, increased heart rate, excitement, confusion per pt   Penicillins Hives   Has patient had a PCN reaction causing immediate rash, facial/tongue/throat swelling, SOB or lightheadedness with hypotension: Yes Has patient had a PCN reaction causing severe rash involving mucus membranes or skin necrosis: Yes Has patient had a PCN reaction that required hospitalization: Yes Has patient had a PCN reaction occurring within the last 10 years: No If all of the above answers are "NO", then may proceed with Cephalosporin use.   Sertraline Other (See Comments)   Numbness in mouth and hyperactivity   Shellfish Allergy Hives   Solu-medrol [methylprednisolone] Other (See Comments)   Numbness and tingling in mouth   Sulfa Antibiotics Hives, Rash   Barbiturates Other (See Comments)   Excitement   Bee Pollen Hives   Bee Venom Hives   Diovan [valsartan] Nausea Only   Gabapentin Other (See Comments)   Visual disturbance   Livalo [pitavastatin] Swelling, Other (See Comments)   Facial swelling   Other Palpitations, Other (See Comments), Rash   NARCOTIC ANALGESICS-TREMORS,  INCREASED HEART RATE Hmg-Coa Reductase Inhibitors - intolerance unknown   Oxycodone Swelling, Rash, Cough   Promethazine-dm Other (See Comments)   "Felt funny"   Tape Rash, Other (See Comments)   Red blotches   Wellness Essentials Blood Sugr [nutritional Supplements] Other (See Comments)   Nuts, strawberries, bicarbonate of soda,   Albuterol Other (See Comments)   Caused wheezing   Ciprofloxacin Other (See Comments)   Crestor [rosuvastatin] Other (See Comments)   Famotidine Other (See Comments)   Unknown reaction   Pred Forte [prednisolone Acetate] Other (See Comments)   Patient is seeing odd color and images while using this   Welchol [colesevelam]    Other reaction(s): myalgias   Celebrex [celecoxib] Swelling, Other (See Comments)   Swelling and numbness (mouth)   Lanolin Itching   Mobic [meloxicam] Swelling, Other (See Comments)   Swelling and numbness in mouth   Nickel Rash   Nitrates, Organic Rash, Other (See Comments)   Chest tightness also   Petrolatum Rash, Other (See Comments)   Soy Allergy Rash   Strawberry Extract Rash        Medication List     STOP taking these medications  amLODipine 5 MG tablet Commonly known as: NORVASC       TAKE these medications    cetirizine 10 MG tablet Commonly known as: ZYRTEC Take 10 mg by mouth daily as needed for allergies.   EPINEPHrine 0.3 mg/0.3 mL Soaj injection Commonly known as: EPI-PEN Inject 0.3 mg into the muscle once as needed (for anaphylaxis).   ezetimibe 10 MG tablet Commonly known as: ZETIA Take 1 tablet (10 mg total) by mouth daily.   hydrALAZINE 25 MG tablet Commonly known as: APRESOLINE Take 1 tablet (25 mg total) by mouth every 4 (four) hours as needed (SBP > 150 mm Hg.).   ibuprofen 600 MG tablet Commonly known as: ADVIL Take 1 tablet (600 mg total) by mouth every 6 (six) hours as needed.   LORazepam 0.5 MG tablet Commonly known as: ATIVAN Take 0.5 mg by mouth daily as needed for  anxiety.   mometasone 0.1 % cream Commonly known as: ELOCON Apply 1 application topically daily.   nebivolol 5 MG tablet Commonly known as: BYSTOLIC Take 1 tablet (5 mg total) by mouth daily. Start taking on: August 07, 2022 What changed:  medication strength See the new instructions.   NIFEdipine 60 MG 24 hr tablet Commonly known as: ADALAT CC Take 1 tablet (60 mg total) by mouth daily. Start taking on: August 07, 2022   Repatha 140 MG/ML Sosy Generic drug: Evolocumab INJECT 140 MG INTO THE SKIN EVERY 14 (FOURTEEN) DAYS.   rosuvastatin 5 MG tablet Commonly known as: CRESTOR Take 1 tablet (5 mg total) by mouth every other day. Start taking on: August 08, 2022   tiZANidine 2 MG tablet Commonly known as: ZANAFLEX Take 2 mg by mouth 3 (three) times daily.   TUMS CHEWY BITES PO Take 1 tablet by mouth daily as needed (acid reflux).   Vitamin B6 50 MG Tabs 1 tablet        Day of Discharge BP 138/65   Pulse (!) 47   Temp 98.3 F (36.8 C)   Resp 11   SpO2 100%   Physical Exam: General: No acute respiratory distress Lungs: Clear to auscultation bilaterally without wheezes or crackles Cardiovascular: Regular rate and rhythm without murmur gallop or rub normal S1 and S2 Abdomen: Nontender, nondistended, soft, bowel sounds positive, no rebound, no ascites, no appreciable mass Extremities: No significant cyanosis, clubbing, or edema bilateral lower extremities  Basic Metabolic Panel: Recent Labs  Lab 08/05/22 1450 08/06/22 0209  NA 140 139  K 4.0 3.5  CL 108 109  CO2 26 23  GLUCOSE 117* 104*  BUN 13 12  CREATININE 0.87 0.72  0.72  CALCIUM 9.6 9.0    CBC: Recent Labs  Lab 08/05/22 1450 08/06/22 0209  WBC 4.9 3.4*  HGB 12.2 11.3*  HCT 40.5 37.1  MCV 76.9* 74.5*  PLT 145* 142*   Time spent in discharge (includes decision making & examination of pt):  30 minutes  08/06/2022, 10:34 AM   Cherene Altes, MD [facility] Office   [phone]

## 2022-08-06 NOTE — ED Notes (Signed)
Patient ambulated to the bathroom. She is now back in bed. Tylenol not given as the patient now says she does not have a headache. Tylenol was returned.

## 2022-08-06 NOTE — Consult Note (Signed)
CARDIOLOGY CONSULT NOTE  Patient ID: Donna Baldwin MRN: [id number] DOB/AGE: 81/11/1941 81 y.o.  Admit date: 08/05/2022 Referring Physician  Malen Gauze, MD Primary Physician:  Harlan Stains, MD Reason for Consultation  Hypertensive urgency  Patient ID: Donna Baldwin, female    DOB: Aug 10, 1941, 81 y.o.   MRN: [id number]  Chief Complaint  Patient presents with   Hypertension   HPI:    Donna Baldwin  is a 81 y.o. ffemale patient with no significant coronary disease but has mildly elevated coronary calcium score with noncritical distal left main minimal disease by coronary CTA in 2020, difficult to control hypertension, multiple medication allergies and intolerance of statins, achalasia cardia SP surgery in 2019, severe vitamin D deficiency, chronic thrombocytopenia felt to be ITP asymptomatic, presented to the emergency room with elevated blood pressure and sharp chest pain and headache, no significant abnormality on CT head, chest pain felt to be musculoskeletal with negative cardiac markers felt stable for discharge from cardiac standpoint but in view of chest discomfort ED physician requested hospitalist admission.  Patient is presently doing well, states that her symptoms are resolved.  She would like to go home.  Denies chest pain or dyspnea.  Past Medical History:  Diagnosis Date   Acid reflux    Anemia    Anxiety    Atherosclerosis    Back pain    Coronary artery disease    GERD (gastroesophageal reflux disease)    HTN (hypertension) 12/03/2013   CT scan-no pheochromocytoma   Hyperlipidemia    Hypertension    Hypertensive heart disease without heart failure    Thrombocytopenia (HCC)    Vitamin D deficiency    Past Surgical History:  Procedure Laterality Date   ESOPHAGEAL MANOMETRY N/A 03/15/2020   Procedure: ESOPHAGEAL MANOMETRY (EM);  Surgeon: Wilford Corner, MD;  Location: [facility];  Service: Endoscopy;  Laterality: N/A;   EYE SURGERY      HELLER MYOTOMY N/A 04/18/2020   Procedure: LAPAROSCOPIC HELLER MYOTOMY WITH UPPER ENDOSCOPY;  Surgeon: Kinsinger, Arta Bruce, MD;  Location: [facility];  Service: General;  Laterality: N/A;   [place] IMPEDANCE STUDY N/A 03/15/2020   Procedure: [place] IMPEDANCE STUDY;  Surgeon: Wilford Corner, MD;  Location: [facility];  Service: Endoscopy;  Laterality: N/A;   SHOULDER SURGERY Bilateral    TONSILLECTOMY     Social History   Tobacco Use   Smoking status: Never   Smokeless tobacco: Never  Substance Use Topics   Alcohol use: No    Alcohol/week: 0.0 standard drinks of alcohol    Family History  Problem Relation Age of Onset   Cancer Father    Allergic rhinitis Neg Hx    Angioedema Neg Hx    Asthma Neg Hx    Atopy Neg Hx    Eczema Neg Hx    Immunodeficiency Neg Hx    Urticaria Neg Hx     Marital Status: Married  ROS  Review of Systems  Cardiovascular:  Negative for chest pain, dyspnea on exertion and leg swelling.   Objective      08/06/2022    4:00 AM 08/06/2022    3:30 AM 08/06/2022    3:00 AM  Vitals with BMI  Systolic 154 008 676  Diastolic 50 56 66  Pulse 43 39 44    Blood pressure (!) 149/50, pulse (!) 43, temperature 97.8 F (36.6 C), temperature source Oral, resp. rate 14, SpO2 100 %.  Physical Exam Neck:     Vascular: No JVD.  Cardiovascular:     Rate and Rhythm: Normal rate and regular rhythm.     Pulses:          Carotid pulses are  on the right side with bruit and  on the left side with bruit.      Dorsalis pedis pulses are 1+ on the right side and 1+ on the left side.       Posterior tibial pulses are 0 on the right side and 0 on the left side.     Heart sounds: Normal heart sounds. No murmur heard.    No gallop.  Pulmonary:     Effort: Pulmonary effort is normal.     Breath sounds: Normal breath sounds.  Abdominal:     General: Bowel sounds are normal.     Palpations: Abdomen is soft.  Musculoskeletal:     Right lower leg: No edema.     Left lower leg: No  edema.  Neurological:     General: No focal deficit present.    Laboratory examination:   Recent Labs    08/05/22 1450 08/06/22 0209  NA 140 139  K 4.0 3.5  CL 108 109  CO2 26 23  GLUCOSE 117* 104*  BUN 13 12  CREATININE 0.87 0.72  0.72  CALCIUM 9.6 9.0  GFRNONAA >60 >60  >60   CrCl cannot be calculated (Unknown ideal weight.).     Latest Ref Rng & Units 08/06/2022    2:09 AM 08/05/2022    2:50 PM 04/11/2021    1:00 PM  CMP  Glucose 70 - 99 mg/dL 104  117  94   BUN 8 - 23 mg/dL '12  13  18   '$ Creatinine 0.44 - 1.00 mg/dL 0.44 - 1.00 mg/dL 0.72    0.72  0.87  0.82   Sodium 135 - 145 mmol/L 139  140  144   Potassium 3.5 - 5.1 mmol/L 3.5  4.0  4.1   Chloride 98 - 111 mmol/L 109  108  105   CO2 22 - 32 mmol/L '23  26  28   '$ Calcium 8.9 - 10.3 mg/dL 9.0  9.6  9.8   Total Protein 6.5 - 8.1 g/dL   7.5   Total Bilirubin 0.3 - 1.2 mg/dL   0.4   Alkaline Phos 38 - 126 U/L   95   AST 15 - 41 U/L   21   ALT 0 - 44 U/L   12       Latest Ref Rng & Units 08/06/2022    2:09 AM 08/05/2022    2:50 PM 07/09/2022   11:12 AM  CBC  WBC 4.0 - 10.5 K/uL 3.4  4.9  3.5   Hemoglobin 12.0 - 15.0 g/dL 11.3  12.2  11.5   Hematocrit 36.0 - 46.0 % 37.1  40.5  37.1   Platelets 150 - 400 K/uL 142  145  144    Lipid Panel Recent Labs    12/18/21 0947  CHOL 256*  TRIG 67  LDLCALC 159*  HDL 86    HEMOGLOBIN A1C No results found for: "HGBA1C", "MPG" TSH Recent Labs    08/06/22 0119  TSH 5.293*   Cardiac Panel (last 3 results) Recent Labs    08/05/22 1450 08/05/22 2021 08/06/22 0209  TROPONINIHS 10 66* 76*     Medications and allergies   Allergies  Allergen Reactions   Acetaminophen Other (See Comments)    Tremor, anxiety   Beef Allergy Anaphylaxis  Beef-Derived Products Anaphylaxis   Codeine Hives and Anxiety   Hydralazine Hives and Shortness Of Breath   Iodine Anaphylaxis   Ivp Dye [Iodinated Contrast Media] Anaphylaxis and Other (See Comments)   Latex Rash   Morphine  And Related Other (See Comments)    Tremors, increased heart rate, excitement, confusion per pt   Penicillins Hives    Has patient had a PCN reaction causing immediate rash, facial/tongue/throat swelling, SOB or lightheadedness with hypotension: Yes Has patient had a PCN reaction causing severe rash involving mucus membranes or skin necrosis: Yes Has patient had a PCN reaction that required hospitalization: Yes Has patient had a PCN reaction occurring within the last 10 years: No If all of the above answers are "NO", then may proceed with Cephalosporin use.    Sertraline Other (See Comments)    Numbness in mouth and hyperactivity   Shellfish Allergy Hives   Solu-Medrol [Methylprednisolone] Other (See Comments)    Numbness and tingling in mouth   Sulfa Antibiotics Hives and Rash   Barbiturates Other (See Comments)    Excitement    Bee Pollen Hives   Bee Venom Hives   Diovan [Valsartan] Nausea Only   Gabapentin Other (See Comments)    Visual disturbance   Livalo [Pitavastatin] Swelling and Other (See Comments)    Facial swelling   Other Palpitations, Other (See Comments) and Rash    NARCOTIC ANALGESICS-TREMORS, INCREASED HEART RATE Hmg-Coa Reductase Inhibitors - intolerance unknown    Oxycodone Swelling, Rash and Cough   Promethazine-Dm Other (See Comments)    "Felt funny"   Tape Rash and Other (See Comments)    Red blotches    Wellness Essentials Blood Sugr [Nutritional Supplements] Other (See Comments)    Nuts, strawberries, bicarbonate of soda,   Albuterol Other (See Comments)    Caused wheezing   Ciprofloxacin Other (See Comments)   Crestor [Rosuvastatin] Other (See Comments)   Famotidine Other (See Comments)    Unknown reaction   Pred Forte [Prednisolone Acetate] Other (See Comments)    Patient is seeing odd color and images while using this   Welchol [Colesevelam]     Other reaction(s): myalgias   Celebrex [Celecoxib] Swelling and Other (See Comments)    Swelling  and numbness (mouth)   Lanolin Itching   Mobic [Meloxicam] Swelling and Other (See Comments)    Swelling and numbness in mouth   Nickel Rash   Nitrates, Organic Rash and Other (See Comments)    Chest tightness also   Petrolatum Rash and Other (See Comments)   Soy Allergy Rash   Strawberry Extract Rash    Current Facility-Administered Medications  Medication Dose Route Frequency Provider Last Rate Last Admin   acetaminophen (TYLENOL) tablet 325 mg  325 mg Oral Once Rise Patience, MD       aspirin EC tablet 81 mg  81 mg Oral Daily Rise Patience, MD       ezetimibe (ZETIA) tablet 10 mg  10 mg Oral Daily Adrian Prows, MD       heparin injection 5,000 Units  5,000 Units Subcutaneous Q8H Rise Patience, MD   5,000 Units at 08/06/22 0544   hydrALAZINE (APRESOLINE) tablet 25 mg  25 mg Oral Q4H PRN Adrian Prows, MD       [START ON 08/07/2022] nebivolol (BYSTOLIC) tablet 5 mg  5 mg Oral Daily Adrian Prows, MD       NIFEdipine (PROCARDIA XL/NIFEDICAL XL) 24 hr tablet 60 mg  60 mg Oral  Daily Adrian Prows, MD       rosuvastatin (CRESTOR) tablet 5 mg  5 mg Oral Britt Bottom, MD       Current Outpatient Medications  Medication Sig Dispense Refill Last Dose   amLODipine (NORVASC) 5 MG tablet TAKE 2 TABLETS BY MOUTH EVERY DAY 60 tablet 1    Calcium Carbonate Antacid (TUMS CHEWY BITES PO) Take 1 tablet by mouth daily as needed (acid reflux).      cetirizine (ZYRTEC) 10 MG tablet Take 10 mg by mouth daily as needed for allergies.       EPINEPHrine 0.3 mg/0.3 mL IJ SOAJ injection Inject 0.3 mg into the muscle once as needed (for anaphylaxis).  1    ibuprofen (ADVIL) 600 MG tablet Take 1 tablet (600 mg total) by mouth every 6 (six) hours as needed. 30 tablet 0    LORazepam (ATIVAN) 0.5 MG tablet Take 0.5 mg by mouth daily as needed for anxiety.      mometasone (ELOCON) 0.1 % cream Apply 1 application topically daily.      nebivolol (BYSTOLIC) 10 MG tablet TAKE 1 TABLET BY MOUTH EVERY DAY (PT  REQUEST GEN THIS TIME) 90 tablet 0    Pyridoxine HCl (VITAMIN B6) 50 MG TABS 1 tablet      REPATHA 140 MG/ML SOSY INJECT 140 MG INTO THE SKIN EVERY 14 (FOURTEEN) DAYS. (Patient not taking: Reported on 07/04/2022) 2 mL 3    tiZANidine (ZANAFLEX) 2 MG tablet Take 2 mg by mouth 3 (three) times daily.       Scheduled Meds:  acetaminophen  325 mg Oral Once   amLODipine  7.5 mg Oral QHS   aspirin EC  81 mg Oral Daily   heparin  5,000 Units Subcutaneous Q8H   nebivolol  10 mg Oral Daily    Radiology:   CT HEAD WO CONTRAST (5MM)  Result Date: 08/05/2022 CLINICAL DATA:  Headache, new or worsening (Age >= 50y) EXAM: CT HEAD WITHOUT CONTRAST TECHNIQUE: Contiguous axial images were obtained from the base of the skull through the vertex without intravenous contrast. RADIATION DOSE REDUCTION: This exam was performed according to the departmental dose-optimization program which includes automated exposure control, adjustment of the mA and/or kV according to patient size and/or use of iterative reconstruction technique. COMPARISON:  CT head 07/05/2018, MRI head 04/28/2022 BRAIN: BRAIN Cerebral ventricle sizes are concordant with the degree of cerebral volume loss. Patchy and confluent areas of decreased attenuation are noted throughout the deep and periventricular white matter of the cerebral hemispheres bilaterally, compatible with chronic microvascular ischemic disease. No evidence of large-territorial acute infarction. No parenchymal hemorrhage. No mass lesion. No extra-axial collection. No mass effect or midline shift. No hydrocephalus. Basilar cisterns are patent. Vascular: No hyperdense vessel. Atherosclerotic calcifications are present within the cavernous internal carotid arteries. Skull: No acute fracture or focal lesion. Sinuses/Orbits: Paranasal sinuses and mastoid air cells are clear. Bilateral lens replacement. Otherwise the orbits are unremarkable. Other: None. IMPRESSION: No acute intracranial  abnormality. Electronically Signed   By: Iven Finn M.D.   On: 08/05/2022 23:15   DG Chest 2 View  Result Date: 08/05/2022 CLINICAL DATA:  Chest pain EXAM: CHEST - 2 VIEW COMPARISON:  09/14/2020 FINDINGS: Cardiac size is within normal limits. Increase in AP diameter of chest suggests possible COPD. Lung fields are clear of any infiltrate or pulmonary edema. There is no pleural effusion or pneumothorax. IMPRESSION: There are no new infiltrates or signs of pulmonary edema. Electronically Signed  By: Elmer Picker M.D.   On: 08/05/2022 14:50    Cardiac Studies:   Renal artery duplex  07/30/2018: No E/O renal artery stenosis.  Sleep study 10/04/2021: No significant OSA. A critically low heart rate during PSG was present in wake and in sleep during recording, HR often in the 30s. A higher heart rate may prevent some arousals? No significant hypoxemia noted.  Coronary CTA 12/08/2019: 1. Coronary artery calcium score 108 Agatston units. This places the patient in the 54th percentile for age and gender, suggesting intermediate risk for future cardiac events. 2.  Nonobstructive ostial left main calcified plaque.  PCV ECHOCARDIOGRAM COMPLETE 07/04/2021 Left ventricle cavity is normal in size and wall thickness. Normal global wall motion. Normal LV systolic function with EF 54%. Doppler evidence of grade II (pseudonormal) diastolic dysfunction, elevated LAP. Calculated EF 54%. Left atrial cavity is mildly dilated. Mild (Grade I) mitral regurgitation. Mild tricuspid regurgitation. No evidence of pulmonary hypertension.   Ambulatory cardiac telemetry 06/05/2021 - 06/12/2021: Predominant rhythm was sinus.  Maximum heart rate 125, minimum heart rate 32, average heart rate 47 bpm.  Patient with 2 episodes of atrial tachycardia with longest lasting 12.6 seconds.  Patient had single sinus pause lasting 3.1 seconds at 4:30 AM, asymptomatic.  Patient triggered events correlated with sinus bradycardia,  PVCs, PACs, and ventricular trigeminy.  No evidence of high degree AV block or ventricular tachycardia.   Carotid artery duplex 08/05/2022: Right: Color duplex indicates moderate heterogeneous and calcified plaque, with no hemodynamically significant stenosis by duplex criteria in the extracranial cerebrovascular circulation.   Left: Heterogeneous and partially calcified plaque at the left carotid bifurcation, with discordant results regarding degree of stenosis by established duplex criteria. Peak velocity suggests 70% - 99% stenosis, with the ICA/ CCA ratio suggesting a lesser degree of stenosis. If establishing a more accurate degree of stenosis is required, cerebral angiogram should be considered, or as a second best test, CTA.  EKG:  08/05/2022: Marked sinus bradycardia at the rate of 43 bpm otherwise normal EKG.  Compared to 07/04/2022, heart rate was 61 bpm.  Compared to 06/05/2021, no significant change.  Assessment   1.  Hypertensive urgency presenting with headache and fatigue now resolved. 2.  Asymptomatic left carotid stenosis, evaluated by Dr. Deitra Mayo, felt to be moderate which I agree, images reviewed. 3.  Statin intolerance with statin induced myopathy, patient now willing to try low-dose statins. 4.  Mild coronary atherosclerosis, no known coronary artery disease. 5.  Hypercholesterolemia 6.  Severe vitamin D deficiency 7.  Asymptomatic chronic thrombocytopenia, felt to be ITP   Recommendations:   Donna Baldwin  is a 81 y.o. female patient with no significant coronary disease but has mildly elevated coronary calcium score with noncritical distal left main minimal disease by coronary CTA in 2020, difficult to control hypertension, multiple medication allergies and intolerance of statins, achalasia cardia SP surgery in 2019, severe vitamin D deficiency, chronic thrombocytopenia felt to be ITP asymptomatic, presented to the emergency room with elevated blood  pressure and sharp chest pain and headache, no significant abnormality on CT head, chest pain felt to be musculoskeletal with negative cardiac markers felt stable for discharge from cardiac standpoint but in view of chest discomfort ED physician requested hospitalist admission.  Patient presently asymptomatic and blood pressure has improved.  She is willing to change her medications and I will follow her up in the outpatient basis and also put her on RPM for evaluation and management of hypertension and  hypertensive urgency and resistant hypertension.  Patient has multiple medication intolerances.  In view of underlying bradycardia, I would like to discontinue amlodipine and would like to try Procardia XL 60 mg daily, reduce the dose of Bystolic to 5 mg daily, add hydralazine 25 mg every 4 hours as needed for SBP >140 mmHg.  Although these have been listed as allergies, patient is willing to try it.  She has severe vitamin D deficiency.  We will check vitamin D levels.  It was 18 in April 2023, she was started on 5000 units of vitamin D supplements daily, hence will obtain baseline lab value today, if still very low, she may need prescription dose vitamin D supplements, discussed with her primary physician Dr. Harlan Stains who agrees and she is willing to follow this in the outpatient basis.  As she is already on supplements, I will go ahead and start her on low-dose statins which patient is again willing to try especially in view of asymptomatic carotid disease with high risk of stroke in future.  I will start her on Crestor 5 mg every other day, Zetia 10 mg daily in the evening.  I will slowly titrate this as tolerated.  From cardiac standpoint she can be discharged home with outpatient follow-up. D/W Dr Thereasa Solo.    Adrian Prows, MD, [facility] 08/06/2022, 6:51 AM Office: [phone]

## 2022-08-06 NOTE — ED Notes (Signed)
Patient asked for tylenol for headache. I advised that tylenol was listed as an allergy. Patient stated she did not know why because she was given tylenol for her tooth extraction from the dentist with no reaction.

## 2022-08-06 NOTE — H&P (Addendum)
History and Physical    Donna Baldwin TKZ:[id number] DOB: 1941/11/17 DOA: 08/05/2022  PCP: Harlan Stains, MD  Patient coming from: Home.  Chief Complaint: Chest pain and headache and elevated blood pressure.  HPI: Donna Baldwin is a 81 y.o. female with history of hypertension and hyperlipidemia presents to the ER after patient started having headache and chest pain.  Patient states she has been having headache for the last 48 hours and over the last 24 hours start developing left-sided chest pressure.  Patient stated her blood pressure has been running high and at times has been having tachycardia.  Patient has multiple medication allergies.  ED Course: In the ER patient blood pressure was 573 systolic.  Patient's heart rate was in the 40s.  But increases to 60s when talking.  EKG shows sinus bradycardia.  Chest x-ray unremarkable.  CT head unremarkable.  High sensitive troponin was 10 delta was 66.  ER physician discussed with patient's cardiologist Dr. Terri Skains who will be seeing patient in consult.  Review of Systems: As per HPI, rest all negative.   Past Medical History:  Diagnosis Date   Acid reflux    Anemia    Anxiety    Atherosclerosis    Back pain    Coronary artery disease    GERD (gastroesophageal reflux disease)    HTN (hypertension) 12/03/2013   CT scan-no pheochromocytoma   Hyperlipidemia    Hypertension    Hypertensive heart disease without heart failure    Thrombocytopenia (HCC)    Vitamin D deficiency     Past Surgical History:  Procedure Laterality Date   ESOPHAGEAL MANOMETRY N/A 03/15/2020   Procedure: ESOPHAGEAL MANOMETRY (EM);  Surgeon: Wilford Corner, MD;  Location: [facility];  Service: Endoscopy;  Laterality: N/A;   EYE SURGERY     HELLER MYOTOMY N/A 04/18/2020   Procedure: LAPAROSCOPIC HELLER MYOTOMY WITH UPPER ENDOSCOPY;  Surgeon: Kinsinger, Arta Bruce, MD;  Location: [facility];  Service: General;  Laterality: N/A;   [place] IMPEDANCE STUDY N/A  03/15/2020   Procedure: [place] IMPEDANCE STUDY;  Surgeon: Wilford Corner, MD;  Location: [facility];  Service: Endoscopy;  Laterality: N/A;   SHOULDER SURGERY Bilateral    TONSILLECTOMY       reports that she has never smoked. She has never used smokeless tobacco. She reports that she does not drink alcohol and does not use drugs.  Allergies  Allergen Reactions   Acetaminophen Other (See Comments)    Tremor, anxiety   Beef Allergy Anaphylaxis   Beef-Derived Products Anaphylaxis   Codeine Hives and Anxiety   Hydralazine Hives and Shortness Of Breath   Iodine Anaphylaxis   Ivp Dye [Iodinated Contrast Media] Anaphylaxis and Other (See Comments)   Latex Rash   Morphine And Related Other (See Comments)    Tremors, increased heart rate, excitement, confusion per pt   Penicillins Hives    Has patient had a PCN reaction causing immediate rash, facial/tongue/throat swelling, SOB or lightheadedness with hypotension: Yes Has patient had a PCN reaction causing severe rash involving mucus membranes or skin necrosis: Yes Has patient had a PCN reaction that required hospitalization: Yes Has patient had a PCN reaction occurring within the last 10 years: No If all of the above answers are "NO", then may proceed with Cephalosporin use.    Sertraline Other (See Comments)    Numbness in mouth and hyperactivity   Shellfish Allergy Hives   Solu-Medrol [Methylprednisolone] Other (See Comments)    Numbness and tingling in mouth  Sulfa Antibiotics Hives and Rash   Barbiturates Other (See Comments)    Excitement    Bee Pollen Hives   Bee Venom Hives   Diovan [Valsartan] Nausea Only   Gabapentin Other (See Comments)    Visual disturbance   Livalo [Pitavastatin] Swelling and Other (See Comments)    Facial swelling   Other Palpitations, Other (See Comments) and Rash    NARCOTIC ANALGESICS-TREMORS, INCREASED HEART RATE Hmg-Coa Reductase Inhibitors - intolerance unknown    Oxycodone Swelling, Rash  and Cough   Promethazine-Dm Other (See Comments)    "Felt funny"   Tape Rash and Other (See Comments)    Red blotches    Wellness Essentials Blood Sugr [Nutritional Supplements] Other (See Comments)    Nuts, strawberries, bicarbonate of soda,   Albuterol Other (See Comments)    Caused wheezing   Ciprofloxacin Other (See Comments)   Crestor [Rosuvastatin] Other (See Comments)   Famotidine Other (See Comments)    Unknown reaction   Pred Forte [Prednisolone Acetate] Other (See Comments)    Patient is seeing odd color and images while using this   Welchol [Colesevelam]     Other reaction(s): myalgias   Celebrex [Celecoxib] Swelling and Other (See Comments)    Swelling and numbness (mouth)   Lanolin Itching   Mobic [Meloxicam] Swelling and Other (See Comments)    Swelling and numbness in mouth   Nickel Rash   Nitrates, Organic Rash and Other (See Comments)    Chest tightness also   Petrolatum Rash and Other (See Comments)   Soy Allergy Rash   Strawberry Extract Rash    Family History  Problem Relation Age of Onset   Cancer Father    Allergic rhinitis Neg Hx    Angioedema Neg Hx    Asthma Neg Hx    Atopy Neg Hx    Eczema Neg Hx    Immunodeficiency Neg Hx    Urticaria Neg Hx     Prior to Admission medications   Medication Sig Start Date End Date Taking? Authorizing Provider  amLODipine (NORVASC) 5 MG tablet TAKE 2 TABLETS BY MOUTH EVERY DAY 07/25/22   Cantwell, Celeste C, PA-C  Calcium Carbonate Antacid (TUMS CHEWY BITES PO) Take 1 tablet by mouth daily as needed (acid reflux).    [provider]  cetirizine (ZYRTEC) 10 MG tablet Take 10 mg by mouth daily as needed for allergies.     [provider]  EPINEPHrine 0.3 mg/0.3 mL IJ SOAJ injection Inject 0.3 mg into the muscle once as needed (for anaphylaxis). 05/31/16   [provider]  ibuprofen (ADVIL) 600 MG tablet Take 1 tablet (600 mg total) by mouth every 6 (six) hours as needed. 04/20/20    Kinsinger, Arta Bruce, MD  LORazepam (ATIVAN) 0.5 MG tablet Take 0.5 mg by mouth daily as needed for anxiety.    [provider]  mometasone (ELOCON) 0.1 % cream Apply 1 application topically daily.    [provider]  nebivolol (BYSTOLIC) 10 MG tablet TAKE 1 TABLET BY MOUTH EVERY DAY (PT REQUEST GEN THIS TIME) 08/02/22   Patwardhan, Reynold Bowen, MD  Pyridoxine HCl (VITAMIN B6) 50 MG TABS 1 tablet    [provider]  REPATHA 140 MG/ML SOSY INJECT 140 MG INTO THE SKIN EVERY 14 (FOURTEEN) DAYS. Patient not taking: Reported on 07/04/2022 05/22/22   Cantwell, Anderson Malta C, PA-C  tiZANidine (ZANAFLEX) 2 MG tablet Take 2 mg by mouth 3 (three) times daily. 07/01/22   [provider]    Physical Exam: Constitutional: Moderately built and nourished. Vitals:   08/06/22 0049 08/06/22 0100 08/06/22 0116 08/06/22 0130  BP:  (!) 187/78 (!) 187/78 (!) 173/65  Pulse:  (!) 47  (!) 45  Resp:  14  14  Temp: 97.9 F (36.6 C)     TempSrc:      SpO2:  91%  100%   Eyes: Anicteric no pallor. ENMT: No discharge from the ears eyes nose and mouth. Neck: No mass felt.  No neck rigidity. Respiratory: No rhonchi or crepitations. Cardiovascular: S1-S2 heard. Abdomen: Soft nontender bowel sound present. Musculoskeletal: No edema. Skin: No rash. Neurologic: Alert awake oriented to time place and person.  Moves all extremities. Psychiatric: Appears normal.  Normal affect.   Labs on Admission: I have personally reviewed following labs and imaging studies  CBC: Recent Labs  Lab 08/05/22 1450  WBC 4.9  HGB 12.2  HCT 40.5  MCV 76.9*  PLT 062*   Basic Metabolic Panel: Recent Labs  Lab 08/05/22 1450  NA 140  K 4.0  CL 108  CO2 26  GLUCOSE 117*  BUN 13  CREATININE 0.87  CALCIUM 9.6   GFR: CrCl cannot be calculated (Unknown ideal weight.). Liver Function Tests: No results for input(s): "AST", "ALT", "ALKPHOS", "BILITOT", "PROT", "ALBUMIN" in the last 168 hours. No results  for input(s): "LIPASE", "AMYLASE" in the last 168 hours. No results for input(s): "AMMONIA" in the last 168 hours. Coagulation Profile: No results for input(s): "INR", "PROTIME" in the last 168 hours. Cardiac Enzymes: No results for input(s): "CKTOTAL", "CKMB", "CKMBINDEX", "TROPONINI" in the last 168 hours. BNP (last 3 results) No results for input(s): "PROBNP" in the last 8760 hours. HbA1C: No results for input(s): "HGBA1C" in the last 72 hours. CBG: Recent Labs  Lab 08/06/22 0127  GLUCAP 94   Lipid Profile: No results for input(s): "CHOL", "HDL", "LDLCALC", "TRIG", "CHOLHDL", "LDLDIRECT" in the last 72 hours. Thyroid Function Tests: No results for input(s): "TSH", "T4TOTAL", "FREET4", "T3FREE", "THYROIDAB" in the last 72 hours. Anemia Panel: No results for input(s): "VITAMINB12", "FOLATE", "FERRITIN", "TIBC", "IRON", "RETICCTPCT" in the last 72 hours. Urine analysis:    Component Value Date/Time   COLORURINE YELLOW 04/03/2017 1059   APPEARANCEUR CLEAR 04/03/2017 1059   LABSPEC 1.005 04/03/2017 1059   PHURINE 6.0 04/03/2017 1059   GLUCOSEU NEGATIVE 04/03/2017 1059   HGBUR NEGATIVE 04/03/2017 1059   BILIRUBINUR NEGATIVE 04/03/2017 1059   [place] 04/03/2017 1059   PROTEINUR NEGATIVE 04/03/2017 1059   NITRITE NEGATIVE 04/03/2017 1059   LEUKOCYTESUR NEGATIVE 04/03/2017 1059   Sepsis Labs: '@LABRCNTIP'$ (procalcitonin:4,lacticidven:4) )No results found for this or any previous visit (from the past 240 hour(s)).   Radiological Exams on Admission: CT HEAD WO CONTRAST (5MM)  Result Date: 08/05/2022 CLINICAL DATA:  Headache, new or worsening (Age >= 50y) EXAM: CT HEAD WITHOUT CONTRAST TECHNIQUE: Contiguous axial images were obtained from the base of the skull through the vertex without intravenous contrast. RADIATION DOSE REDUCTION: This exam was performed according to the departmental dose-optimization program which includes automated exposure control, adjustment of the mA  and/or kV according to patient size and/or use of iterative reconstruction technique. COMPARISON:  CT head 07/05/2018, MRI head 04/28/2022 BRAIN: BRAIN Cerebral ventricle sizes are concordant with the degree of cerebral volume loss. Patchy and confluent areas of decreased attenuation are noted throughout the deep and periventricular white matter of the cerebral hemispheres bilaterally, compatible with chronic microvascular ischemic disease. No evidence of large-territorial acute infarction. No parenchymal  hemorrhage. No mass lesion. No extra-axial collection. No mass effect or midline shift. No hydrocephalus. Basilar cisterns are patent. Vascular: No hyperdense vessel. Atherosclerotic calcifications are present within the cavernous internal carotid arteries. Skull: No acute fracture or focal lesion. Sinuses/Orbits: Paranasal sinuses and mastoid air cells are clear. Bilateral lens replacement. Otherwise the orbits are unremarkable. Other: None. IMPRESSION: No acute intracranial abnormality. Electronically Signed   By: Iven Finn M.D.   On: 08/05/2022 23:15   DG Chest 2 View  Result Date: 08/05/2022 CLINICAL DATA:  Chest pain EXAM: CHEST - 2 VIEW COMPARISON:  09/14/2020 FINDINGS: Cardiac size is within normal limits. Increase in AP diameter of chest suggests possible COPD. Lung fields are clear of any infiltrate or pulmonary edema. There is no pleural effusion or pneumothorax. IMPRESSION: There are no new infiltrates or signs of pulmonary edema. Electronically Signed   By: Elmer Picker M.D.   On: 08/05/2022 14:50    EKG: Independently reviewed.  Sinus bradycardia heart rate 43 bpm.  Assessment/Plan Principal Problem:   Chest pain Active Problems:   Hypertensive urgency   Bradycardia    Chest pain with elevated troponin -cardiologist was notified.  Will trend cardiac markers.  Patient is not allergic to aspirin so we will keep patient on aspirin.  Chest pain could also be from hypertensive  urgency. Hypertensive urgency likely contributing to symptoms.  Patient has allergies to multiple medications.  Will dose her home dose of amlodipine 7.5 at bedtime now.  Continue Bystolic with holding parameters.  Follow blood pressure trends. Headache likely from hypertensive urgency CT head is unremarkable.  Is improving. Bradycardia -at times bradycardia seen.  But heart rate improves with exertion.  We will have holding parameters for Bystolic.  Check TSH. History of ITP followed by oncologist.  Addendum -patient requesting Tylenol for headache.  Tylenol is listed as allergies in the medication list.  Patient states she has taken Tylenol recently when she had gone to her dentist and had no reaction.  Tylenol 325 mg ordered.   DVT prophylaxis: Heparin.  Note that patient has history of ITP so if platelets decrease may have to hold. Code Status: Full code. Family Communication: Discussed with patient. Disposition Plan: Home. Consults called: Cardiology. Admission status: Observation.   Rise Patience MD [facility] Pager [phone].  If 7PM-7AM, please contact night-coverage [email or web address] Password [email or web address]  08/06/2022, 2:02 AM

## 2022-08-06 NOTE — ED Notes (Signed)
Patient is resting in bed. Blood pressure is normal and patient no longer has a headache. Requested something to help her sleep. MD notified.

## 2022-08-06 NOTE — ED Notes (Signed)
Main pharmacy notified about medication verification.

## 2022-08-13 NOTE — Telephone Encounter (Signed)
I had mentioned that her care be transferred to me instead of MP. Donna Baldwin must have overlooked. Please make this happen for her to see me in 2 - 3 weeks, was supposed to se MP on 15th

## 2022-08-15 NOTE — Addendum Note (Signed)
Addended by: Valentina Shaggy on: 08/15/2022 08:56 AM   Modules accepted: Orders

## 2022-08-15 NOTE — Telephone Encounter (Signed)
I was able to talk to the patient. She prefers to get titers before proceeding with the next vaccine. I ordered the [facility] spike protein before proceeding. I gave her the address of the [facility].   Salvatore Marvel, MD [facility]

## 2022-08-16 NOTE — Telephone Encounter (Signed)
Can you get this appointment changed please. This has not been done.

## 2022-08-19 NOTE — Progress Notes (Signed)
Please do not sign the note, just send and it will close and keep it open for me to review and sign off or do addend.  I will discuss the above matter with her on her visit. I agree with above assessment and need to find how truly she is intolerant to statins

## 2022-08-19 NOTE — Progress Notes (Signed)
Notes for follow-up call with Ms. Donna Baldwin after initial RPM/PCM enrollment.   Hospital discharge medication reconciliation Taking: Procardia XL '60mg'$  daily Bystolic '5mg'$  daily Hydralazine '25mg'$  q4h prn SBP >140 (patient reports to not need a dose so far). Not taking: Crestor '5mg'$  every other day Zetia '10mg'$  qPM  Hypertension/bradycardia Patient has complaints of weakness and overall not feeling well when her HR is 70-80; she reports it wakes her up at night She has been experiencing some pill esophagitis with the nifedipine as well as shakiness and SOB  CAD/hyperlipidemia Patient is willing to try Repatha  Patient would also like for Donna Baldwin to evaluate her LFTs and calcified aorta

## 2022-08-19 NOTE — Telephone Encounter (Signed)
error 

## 2022-08-20 ENCOUNTER — Other Ambulatory Visit: Payer: Self-pay | Admitting: Allergy & Immunology

## 2022-08-20 DIAGNOSIS — Z9189 Other specified personal risk factors, not elsewhere classified: Secondary | ICD-10-CM | POA: Diagnosis not present

## 2022-08-20 NOTE — Addendum Note (Signed)
Addended by: Clovis Cao A on: 08/20/2022 12:29 PM   Modules accepted: Orders

## 2022-08-21 ENCOUNTER — Encounter: Payer: Self-pay | Admitting: Cardiology

## 2022-08-21 ENCOUNTER — Ambulatory Visit: Payer: Medicare HMO | Admitting: Cardiology

## 2022-08-21 VITALS — BP 152/79 | HR 51 | Temp 97.7°F | Resp 16 | Ht 62.0 in | Wt 119.4 lb

## 2022-08-21 DIAGNOSIS — I10 Essential (primary) hypertension: Secondary | ICD-10-CM

## 2022-08-21 DIAGNOSIS — K224 Dyskinesia of esophagus: Secondary | ICD-10-CM | POA: Diagnosis not present

## 2022-08-21 DIAGNOSIS — I700 Atherosclerosis of aorta: Secondary | ICD-10-CM | POA: Diagnosis not present

## 2022-08-21 DIAGNOSIS — E7800 Pure hypercholesterolemia, unspecified: Secondary | ICD-10-CM

## 2022-08-21 DIAGNOSIS — I6523 Occlusion and stenosis of bilateral carotid arteries: Secondary | ICD-10-CM

## 2022-08-21 LAB — SARS-COV-2 SEMI-QUANTITATIVE TOTAL ANTIBODY, SPIKE: SARS-CoV-2 Spike Ab Interp: POSITIVE

## 2022-08-21 LAB — SARS-COV-2 SPIKE AB DILUTION: SARS-CoV-2 Spike Ab Dilution: 12734 U/mL (ref <0.8)

## 2022-08-21 NOTE — Progress Notes (Unsigned)
Primary Physician/Referring:  Harlan Stains, MD  Patient ID: Donna Baldwin, female    DOB: May 20, 1941, 81 y.o.   MRN: [id number]  Chief Complaint  Patient presents with   Hypertension   Follow-up    2 weeks   HPI:    KYRIA BUMGARDNER  is a 81 y.o. AAF with no significant coronary disease but has mildly elevated coronary calcium score with noncritical distal left main minimal disease by coronary CTA in 2020, difficult to control hypertension, multiple medication allergies and intolerance of statins, achalasia cardia SP surgery in 2019, severe vitamin D deficiency, chronic thrombocytopenia felt to be ITP asymptomatic, presented to the emergency room with elevated blood pressure and sharp chest pain and headache, no significant abnormality on CT head, chest pain felt to be musculoskeletal with negative cardiac markers on 08/06/2022.  Comes to the office for follow-up.  States that her blood pressure has significantly improved.  Presently doing well and has no specific complaints.  Accompanied by her husband.  He has multiple questions regarding medications, she is not willing to try statins as she feels she has statin induced myalgias.  Past Medical History:  Diagnosis Date   Acid reflux    Anemia    Anxiety    Atherosclerosis    Back pain    Coronary artery disease    GERD (gastroesophageal reflux disease)    HTN (hypertension) 12/03/2013   CT scan-no pheochromocytoma   Hyperlipidemia    Hypertension    Hypertensive heart disease without heart failure    Thrombocytopenia (HCC)    Vitamin D deficiency    Past Surgical History:  Procedure Laterality Date   ESOPHAGEAL MANOMETRY N/A 03/15/2020   Procedure: ESOPHAGEAL MANOMETRY (EM);  Surgeon: Wilford Corner, MD;  Location: [facility];  Service: Endoscopy;  Laterality: N/A;   EYE SURGERY     HELLER MYOTOMY N/A 04/18/2020   Procedure: LAPAROSCOPIC HELLER MYOTOMY WITH UPPER ENDOSCOPY;  Surgeon: Kinsinger, Arta Bruce, MD;   Location: [facility];  Service: General;  Laterality: N/A;   [place] IMPEDANCE STUDY N/A 03/15/2020   Procedure: [place] IMPEDANCE STUDY;  Surgeon: Wilford Corner, MD;  Location: [facility];  Service: Endoscopy;  Laterality: N/A;   SHOULDER SURGERY Bilateral    TONSILLECTOMY     Family History  Problem Relation Age of Onset   Cancer Father    Allergic rhinitis Neg Hx    Angioedema Neg Hx    Asthma Neg Hx    Atopy Neg Hx    Eczema Neg Hx    Immunodeficiency Neg Hx    Urticaria Neg Hx     Social History   Tobacco Use   Smoking status: Never   Smokeless tobacco: Never  Substance Use Topics   Alcohol use: No    Alcohol/week: 0.0 standard drinks of alcohol   Marital Status: Married  ROS  Review of Systems  Cardiovascular:  Negative for chest pain, dyspnea on exertion and leg swelling.   Objective      08/21/2022    2:28 PM 08/21/2022    2:18 PM 08/06/2022   10:15 AM  Vitals with BMI  Height  5' 2"    Weight  119 lbs 6 oz   BMI  41.63   Systolic 845 364 680  Diastolic 79 89 73  Pulse 51 75    Today's Vitals   08/21/22 1418 08/21/22 1428  BP: (!) 173/89 (!) 152/79  Pulse: 75 (!) 51  Resp: 16   Temp: 97.7 F (36.5  C)   TempSrc: Temporal   SpO2: 100% 100%  Weight: 119 lb 6.4 oz (54.2 kg)   Height: 5' 2"  (1.575 m)    Body mass index is 21.84 kg/m.  Physical Exam Cardiovascular:     Pulses:          Carotid pulses are  on the right side with bruit and  on the left side with bruit.      Dorsalis pedis pulses are 0 on the right side and 0 on the left side.       Posterior tibial pulses are 1+ on the right side and 1+ on the left side.    Medications and allergies   Allergies  Allergen Reactions   Beef Allergy Anaphylaxis   Beef-Derived Products Anaphylaxis   Codeine Hives and Anxiety   Hydralazine Hives and Shortness Of Breath   Iodine Anaphylaxis   Iodine-131 Anaphylaxis   Ivp Dye [Iodinated Contrast Media] Anaphylaxis and Other (See Comments)   Latex Rash    Morphine And Related Other (See Comments)    Tremors, increased heart rate, excitement, confusion per pt   Penicillins Hives    Has patient had a PCN reaction causing immediate rash, facial/tongue/throat swelling, SOB or lightheadedness with hypotension: Yes Has patient had a PCN reaction causing severe rash involving mucus membranes or skin necrosis: Yes Has patient had a PCN reaction that required hospitalization: Yes Has patient had a PCN reaction occurring within the last 10 years: No If all of the above answers are "NO", then may proceed with Cephalosporin use.    Sertraline Other (See Comments)    Numbness in mouth and hyperactivity   Shellfish Allergy Hives   Solu-Medrol [Methylprednisolone] Other (See Comments)    Numbness and tingling in mouth   Sulfa Antibiotics Hives and Rash   Sulfasalazine Hives and Rash   Barbiturates Other (See Comments)    Excitement    Bee Pollen Hives   Bee Venom Hives   Diovan [Valsartan] Nausea Only   Gabapentin Other (See Comments)    Visual disturbance   Livalo [Pitavastatin] Swelling and Other (See Comments)    Facial swelling   Nutritional Supplements     Other reaction(s): Other (See Comments) Nuts, strawberries, bicarbonate of soda, Nuts, strawberries, bicarbonate of soda,   Other Palpitations, Other (See Comments) and Rash    NARCOTIC ANALGESICS-TREMORS, INCREASED HEART RATE Hmg-Coa Reductase Inhibitors - intolerance unknown    Oxycodone Swelling, Rash and Cough   Promethazine-Dm Other (See Comments)    "Felt funny"   Tape Rash and Other (See Comments)    Red blotches    Wellness Essentials Blood Sugr [Nutritional Supplements] Other (See Comments)    Nuts, strawberries, bicarbonate of soda,   Albuterol Other (See Comments)    Caused wheezing   Ciprofloxacin Other (See Comments)   Crestor [Rosuvastatin] Other (See Comments)   Diltiazem Hcl     Other reaction(s): foot sores   Ezetimibe     Other reaction(s): myalgias    Famotidine Other (See Comments)    Unknown reaction   Oxycodone Hcl     Other reaction(s): hives, rash   Pred Forte [Prednisolone Acetate] Other (See Comments)    Patient is seeing odd color and images while using this   Welchol [Colesevelam]     Other reaction(s): myalgias   Celebrex [Celecoxib] Swelling and Other (See Comments)    Swelling and numbness (mouth)   Lanolin Itching   Mobic [Meloxicam] Swelling and Other (See Comments)  Swelling and numbness in mouth   Nickel Rash   Nitrates, Organic Rash and Other (See Comments)    Chest tightness also   Petrolatum Rash and Other (See Comments)   Soy Allergy Rash   Strawberry Extract Rash     Medication list after today's encounter   Current Outpatient Medications:    acetaminophen (TYLENOL) 500 MG tablet, 2 tablets, Disp: , Rfl:    Calcium Carbonate Antacid (TUMS CHEWY BITES PO), Take 1 tablet by mouth daily as needed (acid reflux)., Disp: , Rfl:    cetirizine (ZYRTEC) 10 MG tablet, Take 10 mg by mouth daily as needed for allergies. , Disp: , Rfl:    cyanocobalamin (VITAMIN B12) 500 MCG tablet, Take 500 mcg by mouth daily., Disp: , Rfl:    hydrALAZINE (APRESOLINE) 25 MG tablet, Take 1 tablet (25 mg total) by mouth every 4 (four) hours as needed (SBP > 150 mm Hg.)., Disp: 60 tablet, Rfl: 2   ibuprofen (ADVIL) 600 MG tablet, Take 1 tablet (600 mg total) by mouth every 6 (six) hours as needed., Disp: 30 tablet, Rfl: 0   LORazepam (ATIVAN) 0.5 MG tablet, Take 0.5 mg by mouth daily as needed for anxiety., Disp: , Rfl:    mometasone (ELOCON) 0.1 % cream, Apply 1 application topically daily., Disp: , Rfl:    nebivolol (BYSTOLIC) 5 MG tablet, Take 1 tablet (5 mg total) by mouth daily., Disp: 30 tablet, Rfl: 2   NIFEdipine (ADALAT CC) 60 MG 24 hr tablet, Take 1 tablet (60 mg total) by mouth daily., Disp: 30 tablet, Rfl: 2   Pyridoxine HCl (VITAMIN B6) 50 MG TABS, 1 tablet, Disp: , Rfl:    Vitamin D, Ergocalciferol, (DRISDOL) 1.25 MG (50000  UNIT) CAPS capsule, Take 50,000 Units by mouth once a week., Disp: , Rfl:    EPINEPHrine 0.3 mg/0.3 mL IJ SOAJ injection, Inject 0.3 mg into the muscle once as needed (for anaphylaxis). (Patient not taking: Reported on 08/21/2022), Disp: , Rfl: 1   ezetimibe (ZETIA) 10 MG tablet, Take 1 tablet (10 mg total) by mouth daily., Disp: 30 tablet, Rfl: 2   rosuvastatin (CRESTOR) 5 MG tablet, Take 1 tablet (5 mg total) by mouth every other day., Disp: 15 tablet, Rfl: 2  Laboratory examination:   Lab Results  Component Value Date   NA 139 08/06/2022   K 3.5 08/06/2022   CO2 23 08/06/2022   GLUCOSE 104 (H) 08/06/2022   BUN 12 08/06/2022   CREATININE 0.72 08/06/2022   CREATININE 0.72 08/06/2022   CALCIUM 9.0 08/06/2022   GFRNONAA >60 08/06/2022   GFRNONAA >60 08/06/2022       Latest Ref Rng & Units 08/06/2022    2:09 AM 08/05/2022    2:50 PM 04/11/2021    1:00 PM  BMP  Glucose 70 - 99 mg/dL 104  117  94   BUN 8 - 23 mg/dL 12  13  18    Creatinine 0.44 - 1.00 mg/dL 0.44 - 1.00 mg/dL 0.72    0.72  0.87  0.82   Sodium 135 - 145 mmol/L 139  140  144   Potassium 3.5 - 5.1 mmol/L 3.5  4.0  4.1   Chloride 98 - 111 mmol/L 109  108  105   CO2 22 - 32 mmol/L 23  26  28    Calcium 8.9 - 10.3 mg/dL 9.0  9.6  9.8       Latest Ref Rng & Units 04/11/2021    1:00 PM 07/06/2018  4:54 AM 04/03/2017   10:59 AM  Hepatic Function  Total Protein 6.5 - 8.1 g/dL 7.5  6.3  6.9   Albumin 3.5 - 5.0 g/dL 4.2  3.7  4.4   AST 15 - 41 U/L 21  21  18    ALT 0 - 44 U/L 12  12  12    Alk Phosphatase 38 - 126 U/L 95  81  103   Total Bilirubin 0.3 - 1.2 mg/dL 0.4  0.5  0.5     Lipid Panel Recent Labs    12/18/21 0947  CHOL 256*  TRIG 37  LDLCALC 159*  HDL 86    HEMOGLOBIN A1C No results found for: "HGBA1C", "MPG" TSH Recent Labs    08/06/22 0119  TSH 5.293*   Component Ref Range & Units 08/06/2022  Vit D, 25-Hydroxy 30 - 100 ng/mL 21.70 Low     Radiology:    Cardiac Studies:   Renal artery duplex   August 21, 2018: No E/O renal artery stenosis.   Sleep study 10/04/2021: No significant OSA. A critically low heart rate during PSG was present in wake and in sleep during recording, HR often in the 30s. A higher heart rate may prevent some arousals? No significant hypoxemia noted.   Coronary CTA 12/08/2019: 1. Coronary artery calcium score 108 Agatston units. This places the patient in the 54th percentile for age and gender, suggesting intermediate risk for future cardiac events. 2.  Nonobstructive ostial left main calcified plaque.   PCV ECHOCARDIOGRAM COMPLETE 07/04/2021 Left ventricle cavity is normal in size and wall thickness. Normal global wall motion. Normal LV systolic function with EF 54%. Doppler evidence of grade II (pseudonormal) diastolic dysfunction, elevated LAP. Calculated EF 54%. Left atrial cavity is mildly dilated. Mild (Grade I) mitral regurgitation. Mild tricuspid regurgitation. No evidence of pulmonary hypertension.   Ambulatory cardiac telemetry 06/05/2021 - 06/12/2021: Predominant rhythm was sinus.  Maximum heart rate 125, minimum heart rate 32, average heart rate 47 bpm.  Patient with 2 episodes of atrial tachycardia with longest lasting 12.6 seconds.  Patient had single sinus pause lasting 3.1 seconds at 4:30 AM, asymptomatic.  Patient triggered events correlated with sinus bradycardia, PVCs, PACs, and ventricular trigeminy.  No evidence of high degree AV block or ventricular tachycardia.   Carotid artery duplex 08/05/2022:  Right: Color duplex indicates moderate heterogeneous and calcified plaque, with no hemodynamically significant stenosis by duplex criteria in the extracranial cerebrovascular circulation.   Left: Heterogeneous and partially calcified plaque at the left carotid bifurcation, with discordant results regarding degree of stenosis by established duplex criteria. Peak velocity suggests 70% - 99% stenosis, with the ICA/ CCA ratio suggesting a lesser degree  of stenosis. If establishing a more accurate degree of stenosis is required, cerebral angiogram should be considered, or as a second best test, CTA.  EKG:   08/05/2022: Marked sinus bradycardia at the rate of 43 bpm otherwise normal EKG.  Compared to 07/04/2022, heart rate was 61 bpm.  Compared to 06/05/2021, no significant change.    Assessment     ICD-10-CM   1. Primary hypertension  I10 CMP14+EGFR    2. Atherosclerosis of both carotid arteries  I65.23 ezetimibe (ZETIA) 10 MG tablet    rosuvastatin (CRESTOR) 5 MG tablet    3. Esophageal dysmotility  K22.4     4. Hypercholesteremia  E78.00 ezetimibe (ZETIA) 10 MG tablet    rosuvastatin (CRESTOR) 5 MG tablet    5. Aortic atherosclerosis (HCC)  I70.0  Orders Placed This Encounter  Procedures   CMP14+EGFR    No orders of the defined types were placed in this encounter.   Medications Discontinued During This Encounter  Medication Reason   ezetimibe (ZETIA) 10 MG tablet    tiZANidine (ZANAFLEX) 2 MG tablet    REPATHA 140 MG/ML SOSY Not covered by the pt's insurance   rosuvastatin (CRESTOR) 5 MG tablet Reorder     Recommendations:   JACKIE RUSSMAN is a 81 y.o.  AAF with no significant coronary disease but has mildly elevated coronary calcium score with noncritical distal left main minimal disease by coronary CTA in 2020, difficult to control hypertension, multiple medication allergies and intolerance of statins, achalasia cardia SP surgery in 2019, severe vitamin D deficiency, chronic thrombocytopenia felt to be ITP asymptomatic, presented to the emergency room with elevated blood pressure and sharp chest pain and headache, no significant abnormality on CT head, chest pain felt to be musculoskeletal with negative cardiac markers on 08/06/2022.  Patient is presently not taking Zetia or rosuvastatin and would like to try Repatha.  After review, I advised the patient that her symptoms of difficulty swallowing is not related to  the medication and that she needs to see her gastroenterologist as previously she has been diagnosed with esophageal dysmotility. Now on Vit D supplement.   Advised her to crush the Crestor and also Zetia and take it with applesauce.  She will try this for at least a week to 10 days to see if she has any side effects.  Indeed if she develops severe myalgias, I will certainly try to get Repatha prior authorization performed.  Patient's blood pressure is much improved from systolic blood pressure >191 to the present 145 mmHg.  She is presently enrolled in RPM and CPM in our office as well.  She does not want to make any changes to her medications for now.  She does take hydralazine when systolic blood pressure is >140 mmHg.  There is no clinical evidence heart failure.  She is presently doing well and I would like to see her back in 6 weeks at her request for monitoring of her hyperlipidemia and hypertension.  Patient is concerned about abnormal LFTs, as she is on multiple medications, she would like me to order the blood work.  CMP orders placed.  She is also concerned about abdominal aortic atherosclerosis, I reviewed her CT angiogram of the abdomen performed in 2019, she has very mild atherosclerosis, probably age-appropriate.  Again I have reassured her. 40 minute OV encounter to assess her symptoms, reassure her and discuss therapy options with regard to hypertension and hyperlipidemia.       Adrian Prows, MD, [facility] 08/23/2022, 12:01 AM Office: [phone]

## 2022-08-29 ENCOUNTER — Other Ambulatory Visit (HOSPITAL_COMMUNITY): Payer: Self-pay

## 2022-08-29 ENCOUNTER — Telehealth: Payer: Self-pay

## 2022-08-29 DIAGNOSIS — I10 Essential (primary) hypertension: Secondary | ICD-10-CM | POA: Diagnosis not present

## 2022-08-29 NOTE — Telephone Encounter (Signed)
She has been on this and doing well and has had GI issues for a while. BP is excellent. I would continue the medication. She can try and hold for 1 week if no change in symptoms to restart the [facility]

## 2022-08-29 NOTE — Telephone Encounter (Signed)
FYI - patient called and has been having right sided abdominal pain and after looking into nifedipine, she believes the drug is the culprit. Patient has contacted her GI doctor to get an appointment and is on the waitlist for cancellations. In the meantime, GI advised to eat then 2 hours later take the medication as well as take miralax.   Adalat CC does have the rare side effect of GI obstruction in patients with existing GI strictures (patient has had LES surgery) due to ghost tablet formulation.   Patient's BP has been well controlled this month on the nifedipine. Average Systolic BP Level 813.8 mmHg Lowest Systolic BP Level 94 mmHg Highest Systolic BP Level 871 mmHg

## 2022-08-30 LAB — CMP14+EGFR
ALT: 9 IU/L (ref 0–32)
AST: 23 IU/L (ref 0–40)
Albumin/Globulin Ratio: 2.1 (ref 1.2–2.2)
Albumin: 4.6 g/dL (ref 3.8–4.8)
Alkaline Phosphatase: 98 IU/L (ref 44–121)
BUN/Creatinine Ratio: 16 (ref 12–28)
BUN: 14 mg/dL (ref 8–27)
Bilirubin Total: 0.4 mg/dL (ref 0.0–1.2)
CO2: 21 mmol/L (ref 20–29)
Calcium: 9.7 mg/dL (ref 8.7–10.3)
Chloride: 102 mmol/L (ref 96–106)
Creatinine, Ser: 0.87 mg/dL (ref 0.57–1.00)
Globulin, Total: 2.2 g/dL (ref 1.5–4.5)
Glucose: 121 mg/dL — ABNORMAL HIGH (ref 70–99)
Potassium: 4.2 mmol/L (ref 3.5–5.2)
Sodium: 139 mmol/L (ref 134–144)
Total Protein: 6.8 g/dL (ref 6.0–8.5)
eGFR: 67 mL/min/{1.73_m2} (ref >59)

## 2022-09-04 DIAGNOSIS — I10 Essential (primary) hypertension: Secondary | ICD-10-CM | POA: Diagnosis not present

## 2022-09-30 DIAGNOSIS — R1012 Left upper quadrant pain: Secondary | ICD-10-CM | POA: Diagnosis not present

## 2022-09-30 DIAGNOSIS — R55 Syncope and collapse: Secondary | ICD-10-CM | POA: Diagnosis not present

## 2022-10-01 DIAGNOSIS — R0781 Pleurodynia: Secondary | ICD-10-CM | POA: Diagnosis not present

## 2022-10-01 DIAGNOSIS — R1012 Left upper quadrant pain: Secondary | ICD-10-CM | POA: Diagnosis not present

## 2022-10-01 DIAGNOSIS — R079 Chest pain, unspecified: Secondary | ICD-10-CM | POA: Diagnosis not present

## 2022-10-04 DIAGNOSIS — I10 Essential (primary) hypertension: Secondary | ICD-10-CM | POA: Diagnosis not present

## 2022-10-10 ENCOUNTER — Encounter: Payer: Self-pay | Admitting: Cardiology

## 2022-10-10 ENCOUNTER — Ambulatory Visit: Payer: Medicare HMO | Admitting: Cardiology

## 2022-10-10 VITALS — BP 134/77 | HR 78 | Temp 98.0°F | Resp 17 | Ht 62.0 in | Wt 116.4 lb

## 2022-10-10 DIAGNOSIS — E7800 Pure hypercholesterolemia, unspecified: Secondary | ICD-10-CM

## 2022-10-10 DIAGNOSIS — I6522 Occlusion and stenosis of left carotid artery: Secondary | ICD-10-CM

## 2022-10-10 DIAGNOSIS — I700 Atherosclerosis of aorta: Secondary | ICD-10-CM | POA: Diagnosis not present

## 2022-10-10 DIAGNOSIS — I10 Essential (primary) hypertension: Secondary | ICD-10-CM | POA: Diagnosis not present

## 2022-10-10 MED ORDER — REPATHA SURECLICK 140 MG/ML ~~LOC~~ SOAJ: 1.0000 mL | SUBCUTANEOUS | 3 refills | Status: DC

## 2022-10-10 NOTE — Progress Notes (Signed)
Primary Physician/Referring:  Harlan Stains, MD  Patient ID: Donna Baldwin, female    DOB: 1941-03-21, 81 y.o.   MRN: [id number]  Chief Complaint  Patient presents with   Hypertension   Hyperlipidemia   Follow-up   HPI:    Donna Baldwin  is a 81 y.o. AAF with no significant coronary disease but has mildly elevated coronary calcium score with noncritical distal left main minimal disease by coronary CTA in 2020, coronary calcium score in the 50th percentile, difficult to control hypertension, multiple medication allergies and intolerance of statins, achalasia cardia SP surgery in 2019, severe vitamin D deficiency, chronic thrombocytopenia felt to be ITP asymptomatic.   She remains asymptomatic. Past Medical History:  Diagnosis Date   Acid reflux    Anemia    Anxiety    Back pain    GERD (gastroesophageal reflux disease)    Hyperlipidemia    Hypertensive heart disease without heart failure    Thrombocytopenia (HCC)    Vitamin D deficiency    Past Surgical History:  Procedure Laterality Date   ESOPHAGEAL MANOMETRY N/A 03/15/2020   Procedure: ESOPHAGEAL MANOMETRY (EM);  Surgeon: Wilford Corner, MD;  Location: [facility];  Service: Endoscopy;  Laterality: N/A;   EYE SURGERY     HELLER MYOTOMY N/A 04/18/2020   Procedure: LAPAROSCOPIC HELLER MYOTOMY WITH UPPER ENDOSCOPY;  Surgeon: Kinsinger, Arta Bruce, MD;  Location: [facility];  Service: General;  Laterality: N/A;   Ages IMPEDANCE STUDY N/A 03/15/2020   Procedure: [place] IMPEDANCE STUDY;  Surgeon: Wilford Corner, MD;  Location: [facility];  Service: Endoscopy;  Laterality: N/A;   SHOULDER SURGERY Bilateral    TONSILLECTOMY     Family History  Problem Relation Age of Onset   Cancer Father    Allergic rhinitis Neg Hx    Angioedema Neg Hx    Asthma Neg Hx    Atopy Neg Hx    Eczema Neg Hx    Immunodeficiency Neg Hx    Urticaria Neg Hx     Social History   Tobacco Use   Smoking status: Never   Smokeless  tobacco: Never  Substance Use Topics   Alcohol use: No    Alcohol/week: 0.0 standard drinks of alcohol   Marital Status: Married  ROS  Review of Systems  Cardiovascular:  Negative for chest pain, dyspnea on exertion and leg swelling.   Objective      10/10/2022    1:17 PM 08/21/2022    2:28 PM 08/21/2022    2:18 PM  Vitals with BMI  Height 5' 2"   5' 2"   Weight 116 lbs 6 oz  119 lbs 6 oz  BMI 42.68  34.19  Systolic 622 297 989  Diastolic 77 79 89  Pulse 78 51 75   Today's Vitals   10/10/22 1317  BP: 134/77  Pulse: 78  Resp: 17  Temp: 98 F (36.7 C)  TempSrc: Temporal  SpO2: 99%  Weight: 116 lb 6.4 oz (52.8 kg)  Height: 5' 2"  (1.575 m)   Body mass index is 21.29 kg/m.  Physical Exam Neck:     Vascular: No carotid bruit or JVD.  Cardiovascular:     Rate and Rhythm: Normal rate and regular rhythm.     Pulses: Intact distal pulses.          Carotid pulses are  on the right side with bruit and  on the left side with bruit.      Dorsalis pedis pulses are 0 on  the right side and 0 on the left side.       Posterior tibial pulses are 1+ on the right side and 1+ on the left side.     Heart sounds: Normal heart sounds. No murmur heard.    No gallop.  Pulmonary:     Effort: Pulmonary effort is normal.     Breath sounds: Normal breath sounds.  Abdominal:     General: Bowel sounds are normal.     Palpations: Abdomen is soft.  Musculoskeletal:     Right lower leg: No edema.     Left lower leg: No edema.    Medications and allergies   Allergies  Allergen Reactions   Beef Allergy Anaphylaxis   Beef-Derived Products Anaphylaxis   Codeine Hives and Anxiety   Hydralazine Hives and Shortness Of Breath   Iodine Anaphylaxis   Iodine-131 Anaphylaxis   Ivp Dye [Iodinated Contrast Media] Anaphylaxis and Other (See Comments)   Latex Rash   Morphine And Related Other (See Comments)    Tremors, increased heart rate, excitement, confusion per pt   Penicillins Hives    Has  patient had a PCN reaction causing immediate rash, facial/tongue/throat swelling, SOB or lightheadedness with hypotension: Yes Has patient had a PCN reaction causing severe rash involving mucus membranes or skin necrosis: Yes Has patient had a PCN reaction that required hospitalization: Yes Has patient had a PCN reaction occurring within the last 10 years: No If all of the above answers are "NO", then may proceed with Cephalosporin use.    Sertraline Other (See Comments)    Numbness in mouth and hyperactivity   Shellfish Allergy Hives   Solu-Medrol [Methylprednisolone] Other (See Comments)    Numbness and tingling in mouth   Sulfa Antibiotics Hives and Rash   Sulfasalazine Hives and Rash   Barbiturates Other (See Comments)    Excitement    Bee Pollen Hives   Bee Venom Hives   Diovan [Valsartan] Nausea Only   Gabapentin Other (See Comments)    Visual disturbance   Livalo [Pitavastatin] Swelling and Other (See Comments)    Facial swelling   Nutritional Supplements     Other reaction(s): Other (See Comments) Nuts, strawberries, bicarbonate of soda, Nuts, strawberries, bicarbonate of soda,   Other Palpitations, Other (See Comments) and Rash    NARCOTIC ANALGESICS-TREMORS, INCREASED HEART RATE Hmg-Coa Reductase Inhibitors - intolerance unknown    Oxycodone Swelling, Rash and Cough   Promethazine-Dm Other (See Comments)    "Felt funny"   Tape Rash and Other (See Comments)    Red blotches    Wellness Essentials Blood Sugr [Nutritional Supplements] Other (See Comments)    Nuts, strawberries, bicarbonate of soda,   Albuterol Other (See Comments)    Caused wheezing   Ciprofloxacin Other (See Comments)   Crestor [Rosuvastatin] Other (See Comments)   Diltiazem Hcl     Other reaction(s): foot sores   Ezetimibe     Other reaction(s): myalgias   Famotidine Other (See Comments)    Unknown reaction   Oxycodone Hcl     Other reaction(s): hives, rash   Pred Forte [Prednisolone  Acetate] Other (See Comments)    Patient is seeing odd color and images while using this   Welchol [Colesevelam]     Other reaction(s): myalgias   Celebrex [Celecoxib] Swelling and Other (See Comments)    Swelling and numbness (mouth)   Lanolin Itching   Mobic [Meloxicam] Swelling and Other (See Comments)    Swelling and numbness in  mouth   Nickel Rash   Nitrates, Organic Rash and Other (See Comments)    Chest tightness also   Petrolatum Rash and Other (See Comments)   Soy Allergy Rash   Strawberry Extract Rash     Medication list after today's encounter   Current Outpatient Medications:    acetaminophen (TYLENOL) 500 MG tablet, 2 tablets, Disp: , Rfl:    Calcium Carbonate Antacid (TUMS CHEWY BITES PO), Take 1 tablet by mouth daily as needed (acid reflux)., Disp: , Rfl:    cetirizine (ZYRTEC) 10 MG tablet, Take 10 mg by mouth daily as needed for allergies. , Disp: , Rfl:    EPINEPHrine 0.3 mg/0.3 mL IJ SOAJ injection, Inject 0.3 mg into the muscle once as needed (for anaphylaxis)., Disp: , Rfl: 1   Evolocumab (REPATHA SURECLICK) 295 MG/ML SOAJ, Inject 140 mg into the skin every 14 (fourteen) days., Disp: 2 mL, Rfl: 3   hydrALAZINE (APRESOLINE) 25 MG tablet, Take 1 tablet (25 mg total) by mouth every 4 (four) hours as needed (SBP > 150 mm Hg.)., Disp: 60 tablet, Rfl: 2   LORazepam (ATIVAN) 0.5 MG tablet, Take 0.5 mg by mouth daily as needed for anxiety., Disp: , Rfl:    mometasone (ELOCON) 0.1 % cream, Apply 1 application topically daily., Disp: , Rfl:    nebivolol (BYSTOLIC) 5 MG tablet, Take 1 tablet (5 mg total) by mouth daily., Disp: 30 tablet, Rfl: 2   NIFEdipine (ADALAT CC) 60 MG 24 hr tablet, Take 1 tablet (60 mg total) by mouth daily., Disp: 30 tablet, Rfl: 2   rosuvastatin (CRESTOR) 5 MG tablet, Take 1 tablet (5 mg total) by mouth every other day., Disp: 15 tablet, Rfl: 2   Vitamin D, Ergocalciferol, (DRISDOL) 1.25 MG (50000 UNIT) CAPS capsule, Take 50,000 Units by mouth once a  week., Disp: , Rfl:    cyanocobalamin (VITAMIN B12) 500 MCG tablet, Take 500 mcg by mouth daily. (Patient not taking: Reported on 10/10/2022), Disp: , Rfl:    Pyridoxine HCl (VITAMIN B6) 50 MG TABS, 1 tablet (Patient not taking: Reported on 10/10/2022), Disp: , Rfl:   Laboratory examination:   Lab Results  Component Value Date   NA 139 08/29/2022   K 4.2 08/29/2022   CO2 21 08/29/2022   GLUCOSE 121 (H) 08/29/2022   BUN 14 08/29/2022   CREATININE 0.87 08/29/2022   CALCIUM 9.7 08/29/2022   EGFR 67 08/29/2022   GFRNONAA >60 08/06/2022   GFRNONAA >60 08/06/2022       Latest Ref Rng & Units 08/29/2022   10:14 AM 08/06/2022    2:09 AM 08/05/2022    2:50 PM  BMP  Glucose 70 - 99 mg/dL 121  104  117   BUN 8 - 27 mg/dL 14  12  13    Creatinine 0.57 - 1.00 mg/dL 0.87  0.72    0.72  0.87   BUN/Creat Ratio 12 - 28 16     Sodium 134 - 144 mmol/L 139  139  140   Potassium 3.5 - 5.2 mmol/L 4.2  3.5  4.0   Chloride 96 - 106 mmol/L 102  109  108   CO2 20 - 29 mmol/L 21  23  26    Calcium 8.7 - 10.3 mg/dL 9.7  9.0  9.6       Latest Ref Rng & Units 08/29/2022   10:14 AM 04/11/2021    1:00 PM 07/06/2018    4:54 AM  Hepatic Function  Total Protein 6.0 -  8.5 g/dL 6.8  7.5  6.3   Albumin 3.8 - 4.8 g/dL 4.6  4.2  3.7   AST 0 - 40 IU/L 23  21  21    ALT 0 - 32 IU/L 9  12  12    Alk Phosphatase 44 - 121 IU/L 98  95  81   Total Bilirubin 0.0 - 1.2 mg/dL 0.4  0.4  0.5     Lipid Panel Recent Labs    12/18/21 0947  CHOL 256*  TRIG 58  LDLCALC 159*  HDL 86    HEMOGLOBIN A1C No results found for: "HGBA1C", "MPG" TSH Recent Labs    08/06/22 0119  TSH 5.293*   Component Ref Range & Units 08/06/2022  Vit D, 25-Hydroxy 30 - 100 ng/mL 21.70 Low     Radiology:    Cardiac Studies:   Renal artery duplex  07/30/2018: No E/O renal artery stenosis.   Sleep study 10/04/2021: No significant OSA. A critically low heart rate during PSG was present in wake and in sleep during recording, HR often  in the 30s. A higher heart rate may prevent some arousals? No significant hypoxemia noted.   Coronary CTA 12/08/2019: 1. Coronary artery calcium score 108 Agatston units,  54th percentile for age and gender, suggesting intermediate risk for future cardiac events. 2.  Nonobstructive ostial left main calcified plaque.   PCV ECHOCARDIOGRAM COMPLETE 07/04/2021 Left ventricle cavity is normal in size and wall thickness. Normal global wall motion. Normal LV systolic function with EF 54%. Doppler evidence of grade II (pseudonormal) diastolic dysfunction, elevated LAP. Calculated EF 54%. Left atrial cavity is mildly dilated. Mild (Grade I) mitral regurgitation. Mild tricuspid regurgitation. No evidence of pulmonary hypertension.   Ambulatory cardiac telemetry 06/05/2021 - 06/12/2021: Predominant rhythm was sinus.  Maximum heart rate 125, minimum heart rate 32, average heart rate 47 bpm.  Patient with 2 episodes of atrial tachycardia with longest lasting 12.6 seconds.  Patient had single sinus pause lasting 3.1 seconds at 4:30 AM, asymptomatic.  Patient triggered events correlated with sinus bradycardia, PVCs, PACs, and ventricular trigeminy.  No evidence of high degree AV block or ventricular tachycardia.   Carotid artery duplex 08/05/2022:  Right: Color duplex indicates moderate heterogeneous and calcified plaque, with no hemodynamically significant stenosis by duplex criteria in the extracranial cerebrovascular circulation.   Left: Heterogeneous and partially calcified plaque at the left carotid bifurcation, with discordant results regarding degree of stenosis by established duplex criteria. Peak velocity suggests 70% - 99% stenosis, with the ICA/ CCA ratio suggesting a lesser degree of stenosis. If establishing a more accurate degree of stenosis is required, cerebral angiogram should be considered, or as a second best test, CTA.  EKG:   08/05/2022: Marked sinus bradycardia at the rate of 43 bpm  otherwise normal EKG.  Compared to 07/04/2022, heart rate was 61 bpm.  Compared to 06/05/2021, no significant change.    Assessment     ICD-10-CM   1. Primary hypertension  I10     2. Aortic atherosclerosis (HCC)  I70.0 Evolocumab (REPATHA SURECLICK) 163 MG/ML SOAJ    3. Hypercholesteremia  E78.00 Evolocumab (REPATHA SURECLICK) 846 MG/ML SOAJ    4. Asymptomatic stenosis of left carotid artery  I65.22 Evolocumab (REPATHA SURECLICK) 659 MG/ML SOAJ       No orders of the defined types were placed in this encounter.   Meds ordered this encounter  Medications   Evolocumab (REPATHA SURECLICK) 935 MG/ML SOAJ    Sig: Inject 140 mg into the  skin every 14 (fourteen) days.    Dispense:  2 mL    Refill:  3    Medications Discontinued During This Encounter  Medication Reason   ibuprofen (ADVIL) 600 MG tablet    ezetimibe (ZETIA) 10 MG tablet      Recommendations:   Donna Baldwin is a 81 y.o.  AAF with no significant coronary disease but has mildly elevated coronary calcium score with noncritical distal left main minimal disease by coronary CTA in 2020, coronary calcium score in the 50th percentile, difficult to control hypertension, multiple medication allergies and intolerance of statins, achalasia cardia SP surgery in 2019, severe vitamin D deficiency, chronic thrombocytopenia felt to be ITP asymptomatic.   Patient unable to tolerate Zetia or rosuvastatin and would like to try Repatha.   I will certainly try to get Repatha prior authorization performed.  Patient's blood pressure is much improved from systolic blood pressure >469 to the present in the 130 mmHg range systolic.  She is now tolerating Bystolic and hyperlipidemia, continue the same.    There is no clinical evidence heart failure.  She is presently doing well and I would like to see her back in 6 weeks at her request for monitoring of her hyperlipidemia and hypertension.  She has asymptomatic carotid artery stenosis, she  is being followed by vascular medicine.  She will bring Repatha to our office and we will teach her how to use the medication and injections.  I will see her back in 6 months or sooner if problems.  If she indeed gets on Repatha, we will recheck her lipids in 4 weeks.   Donna Prows, MD, [facility] 10/10/2022, 1:53 PM Office: [phone]

## 2022-10-14 DIAGNOSIS — R1310 Dysphagia, unspecified: Secondary | ICD-10-CM | POA: Diagnosis not present

## 2022-10-14 DIAGNOSIS — R142 Eructation: Secondary | ICD-10-CM | POA: Diagnosis not present

## 2022-10-14 DIAGNOSIS — K220 Achalasia of cardia: Secondary | ICD-10-CM | POA: Diagnosis not present

## 2022-10-21 ENCOUNTER — Other Ambulatory Visit: Payer: Self-pay

## 2022-10-21 MED ORDER — NIFEDIPINE ER 60 MG PO TB24: 60.0000 mg | ORAL_TABLET | Freq: Every day | ORAL | 2 refills | Status: DC

## 2022-10-24 DIAGNOSIS — E559 Vitamin D deficiency, unspecified: Secondary | ICD-10-CM | POA: Diagnosis not present

## 2022-10-24 DIAGNOSIS — E1169 Type 2 diabetes mellitus with other specified complication: Secondary | ICD-10-CM | POA: Diagnosis not present

## 2022-10-24 DIAGNOSIS — I6522 Occlusion and stenosis of left carotid artery: Secondary | ICD-10-CM | POA: Diagnosis not present

## 2022-10-24 DIAGNOSIS — F411 Generalized anxiety disorder: Secondary | ICD-10-CM | POA: Diagnosis not present

## 2022-10-24 DIAGNOSIS — G720 Drug-induced myopathy: Secondary | ICD-10-CM | POA: Diagnosis not present

## 2022-10-24 DIAGNOSIS — K220 Achalasia of cardia: Secondary | ICD-10-CM | POA: Diagnosis not present

## 2022-10-24 DIAGNOSIS — I10 Essential (primary) hypertension: Secondary | ICD-10-CM | POA: Diagnosis not present

## 2022-10-24 DIAGNOSIS — E785 Hyperlipidemia, unspecified: Secondary | ICD-10-CM | POA: Diagnosis not present

## 2022-10-24 DIAGNOSIS — D509 Iron deficiency anemia, unspecified: Secondary | ICD-10-CM | POA: Diagnosis not present

## 2022-10-24 DIAGNOSIS — I700 Atherosclerosis of aorta: Secondary | ICD-10-CM | POA: Diagnosis not present

## 2022-11-12 ENCOUNTER — Other Ambulatory Visit: Payer: Self-pay | Admitting: Cardiology

## 2022-11-13 ENCOUNTER — Ambulatory Visit: Payer: Medicare HMO | Admitting: Allergy

## 2022-11-13 ENCOUNTER — Encounter: Payer: Self-pay | Admitting: Allergy

## 2022-11-13 VITALS — BP 122/64 | HR 80 | Temp 97.7°F | Resp 16

## 2022-11-13 DIAGNOSIS — T783XXD Angioneurotic edema, subsequent encounter: Secondary | ICD-10-CM | POA: Diagnosis not present

## 2022-11-13 DIAGNOSIS — L509 Urticaria, unspecified: Secondary | ICD-10-CM | POA: Diagnosis not present

## 2022-11-13 MED ORDER — EPINEPHRINE 0.3 MG/0.3ML IJ SOAJ
0.3000 mg | INTRAMUSCULAR | 1 refills | Status: DC | PRN
Start: 2022-11-13 — End: 2024-03-22

## 2022-11-13 NOTE — Patient Instructions (Signed)
Hives and swelling - at this time etiology of hives and swelling is unknown.  Hives can be caused by a variety of different triggers including illness/infection, foods, medications, stings, exercise, pressure, vibrations, extremes of temperature to name a few however majority of the time there is no identifiable trigger.   - will obtain labwork today as follows: CBC w diff, tryptase, hive panel, alpha-gal panel and IgE to apple, cinnamon, nuts, shellfish, beef, soy - start Zyrtec '10mg'$  1 tab daily at this time.  If needed can increase to 1 tab twice a day - avoid apple, cinnamon, nuts for now until labs return - usually topical products do not help much.  Do keep skin moisturized to avoid dry skin  Follow-up in 3 months or sooner if needed

## 2022-11-13 NOTE — Progress Notes (Signed)
Follow-up Note  RE: Donna Baldwin MRN: [id number] DOB: 10/13/1941 Date of Office Visit: 11/13/2022   History of present illness: Donna Baldwin is a 81 y.o. female presenting today for sick visit for hives.  She was last seen in the office on 07/10/21 by Dr. Ernst Bowler.  She presents today with her husband.   She states she has been having hives.  The hives started on 11/04/22.  She states started on her left upper arm and it started to spread over the body.   She has had swelling as well.  The rash is itchy and feeling burning sensation with swelling.  She had leg/feet swelling last night.  She provided pictures of the rash and it is consistent with urticaria.  She is not sure if her medications or the foods she is eating is causing the rash.  She states she has been eating walnuts, apples and cinnamon which are foods she was not normally eating and concerned these are causing the hives.     She has been using a salve and arnica gel which isn't helping.   Denies joint pain.  Hives do go away and do not leave any bruising.  Denies any preceding illnesses.  She states she had to go to her GI as she was having abdominal pain in October.  States was not diagnosed with anything related to her stomach.  She has been taking nebivolol and nifedipine since August; these are the newest started medications.   Does not recall any stings/bites.  She states she had allergy testing years ago that mentioned apple being positive.    She states she has a beef allergy but states she can eat organic beef every so often.   She avoids shellfish.   She can eat flounder and cod without issue.   She avoids soy and can't quite remember why she as told to avoid.  She avoids strawberry due to diverticulitis.    She states she can't get epipen due to pharmacy saying out of stock.    Review of systems: Review of Systems  Constitutional: Negative.   HENT: Negative.    Eyes: Negative.   Respiratory:  Negative.    Cardiovascular: Negative.   Gastrointestinal: Negative.   Musculoskeletal: Negative.   Skin:  Positive for rash.  Allergic/Immunologic: Negative.   Neurological: Negative.      All other systems negative unless noted above in HPI  Past medical/social/surgical/family history have been reviewed and are unchanged unless specifically indicated below.  No changes  Medication List: Current Outpatient Medications  Medication Sig Dispense Refill   acetaminophen (TYLENOL) 500 MG tablet 2 tablets     Calcium Carbonate Antacid (TUMS CHEWY BITES PO) Take 1 tablet by mouth daily as needed (acid reflux).     cyanocobalamin (VITAMIN B12) 500 MCG tablet Take 500 mcg by mouth daily.     EPINEPHrine (EPIPEN 2-PAK) 0.3 mg/0.3 mL IJ SOAJ injection Inject 0.3 mg into the muscle as needed for anaphylaxis. 2 each 1   EPINEPHrine 0.3 mg/0.3 mL IJ SOAJ injection Inject 0.3 mg into the muscle once as needed (for anaphylaxis).  1   Evolocumab (REPATHA SURECLICK) 144 MG/ML SOAJ Inject 140 mg into the skin every 14 (fourteen) days. 2 mL 3   ezetimibe (ZETIA) 10 MG tablet Take 1 tablet by mouth daily.     hydrALAZINE (APRESOLINE) 25 MG tablet Take 1 tablet (25 mg total) by mouth every 4 (four) hours as needed (SBP > 150 mm  Hg.). 60 tablet 2   LORazepam (ATIVAN) 0.5 MG tablet Take 0.5 mg by mouth daily as needed for anxiety.     mometasone (ELOCON) 0.1 % cream Apply 1 application topically daily.     nebivolol (BYSTOLIC) 5 MG tablet Take 1 tablet (5 mg total) by mouth daily. 30 tablet 2   NIFEdipine (ADALAT CC) 60 MG 24 hr tablet Take 1 tablet (60 mg total) by mouth daily. 90 tablet 2   Pyridoxine HCl (VITAMIN B6) 50 MG TABS      rosuvastatin (CRESTOR) 5 MG tablet Take 1 tablet (5 mg total) by mouth every other day. 15 tablet 2   tizanidine (ZANAFLEX) 2 MG capsule Take by mouth.     cetirizine (ZYRTEC) 10 MG tablet Take 10 mg by mouth daily as needed for allergies.  (Patient not taking: Reported on  11/13/2022)     Vitamin D, Ergocalciferol, (DRISDOL) 1.25 MG (50000 UNIT) CAPS capsule Take 50,000 Units by mouth once a week.     No current facility-administered medications for this visit.     Known medication allergies: Allergies  Allergen Reactions   Beef Allergy Anaphylaxis   Beef-Derived Products Anaphylaxis   Codeine Hives and Anxiety   Hydralazine Hives and Shortness Of Breath   Iodine Anaphylaxis   Iodine-131 Anaphylaxis   Ivp Dye [Iodinated Contrast Media] Anaphylaxis and Other (See Comments)   Latex Rash   Morphine And Related Other (See Comments)    Tremors, increased heart rate, excitement, confusion per pt   Penicillins Hives    Has patient had a PCN reaction causing immediate rash, facial/tongue/throat swelling, SOB or lightheadedness with hypotension: Yes Has patient had a PCN reaction causing severe rash involving mucus membranes or skin necrosis: Yes Has patient had a PCN reaction that required hospitalization: Yes Has patient had a PCN reaction occurring within the last 10 years: No If all of the above answers are "NO", then may proceed with Cephalosporin use.    Sertraline Other (See Comments)    Numbness in mouth and hyperactivity   Shellfish Allergy Hives   Solu-Medrol [Methylprednisolone] Other (See Comments)    Numbness and tingling in mouth   Sulfa Antibiotics Hives and Rash   Sulfasalazine Hives and Rash   Barbiturates Other (See Comments)    Excitement    Bee Pollen Hives   Bee Venom Hives   Diovan [Valsartan] Nausea Only   Gabapentin Other (See Comments)    Visual disturbance   Livalo [Pitavastatin] Swelling and Other (See Comments)    Facial swelling   Nutritional Supplements     Other reaction(s): Other (See Comments) Nuts, strawberries, bicarbonate of soda, Nuts, strawberries, bicarbonate of soda,   Other Palpitations, Other (See Comments) and Rash    NARCOTIC ANALGESICS-TREMORS, INCREASED HEART RATE Hmg-Coa Reductase Inhibitors -  intolerance unknown    Oxycodone Swelling, Rash and Cough   Promethazine-Dm Other (See Comments)    "Felt funny"   Tape Rash and Other (See Comments)    Red blotches    Wellness Essentials Blood Sugr [Nutritional Supplements] Other (See Comments)    Nuts, strawberries, bicarbonate of soda,   Albuterol Other (See Comments)    Caused wheezing   Ciprofloxacin Other (See Comments)   Crestor [Rosuvastatin] Other (See Comments)   Diltiazem Hcl     Other reaction(s): foot sores   Ezetimibe     Other reaction(s): myalgias   Famotidine Other (See Comments)    Unknown reaction   Oxycodone Hcl  Other reaction(s): hives, rash   Pred Forte [Prednisolone Acetate] Other (See Comments)    Patient is seeing odd color and images while using this   Welchol [Colesevelam]     Other reaction(s): myalgias   Celebrex [Celecoxib] Swelling and Other (See Comments)    Swelling and numbness (mouth)   Lanolin Itching   Mobic [Meloxicam] Swelling and Other (See Comments)    Swelling and numbness in mouth   Nickel Rash   Nitrates, Organic Rash and Other (See Comments)    Chest tightness also   Petrolatum Rash and Other (See Comments)   Soy Allergy Rash   Strawberry Extract Rash     Physical examination: Blood pressure 122/64, pulse 80, temperature 97.7 F (36.5 C), temperature source Temporal, resp. rate 16, SpO2 100 %.  General: Alert, interactive, in no acute distress. Neck: Supple without lymphadenopathy. Lungs: Clear to auscultation without wheezing, rhonchi or rales. {no increased work of breathing. CV: Normal S1, S2 without murmurs. Abdomen: Nondistended, nontender. Skin: Scattered erythematous urticarial type lesions primarily located anterior left foot , nonvesicular. Extremities:  No clubbing, cyanosis or edema. Neuro:   Grossly intact.  Diagnositics/Labs: None today  Assessment and plan: Urticaria and angioedema - at this time etiology of hives and swelling is unknown.  Hives  can be caused by a variety of different triggers including illness/infection, foods, medications, stings, exercise, pressure, vibrations, extremes of temperature to name a few however majority of the time there is no identifiable trigger.   - will obtain labwork today as follows: CBC w diff, tryptase, hive panel, alpha-gal panel and IgE to apple, cinnamon, nuts, shellfish, beef, soy - start Zyrtec '10mg'$  1 tab daily at this time.  If needed can increase to 1 tab twice a day.    Holding off on pepcid at this time as listed in allergy list (however she is not sure what this caused) - avoid apple, cinnamon, nuts for now until labs return - usually topical products do not help much.  Do keep skin moisturized to avoid dry skin  Follow-up in 3 months or sooner if needed  I appreciate the opportunity to take part in Donna Baldwin's care. Please do not hesitate to contact me with questions.  Sincerely,   Prudy Feeler, MD Allergy/Immunology [facility]

## 2022-11-14 ENCOUNTER — Encounter: Payer: Self-pay | Admitting: Vascular Surgery

## 2022-11-14 ENCOUNTER — Ambulatory Visit: Payer: Medicare HMO | Admitting: Vascular Surgery

## 2022-11-14 ENCOUNTER — Ambulatory Visit (HOSPITAL_COMMUNITY)
Admission: RE | Admit: 2022-11-14 | Discharge: 2022-11-14 | Disposition: A | Discharge status: Home / Self Care | Payer: Medicare HMO | Source: Ambulatory Visit | Attending: Vascular Surgery | Admitting: Vascular Surgery

## 2022-11-14 VITALS — BP 153/70 | HR 62 | Temp 98.0°F | Resp 16 | Ht 62.0 in | Wt 113.9 lb

## 2022-11-14 DIAGNOSIS — I6529 Occlusion and stenosis of unspecified carotid artery: Secondary | ICD-10-CM

## 2022-11-14 DIAGNOSIS — I739 Peripheral vascular disease, unspecified: Secondary | ICD-10-CM | POA: Diagnosis not present

## 2022-11-14 DIAGNOSIS — I6522 Occlusion and stenosis of left carotid artery: Secondary | ICD-10-CM | POA: Diagnosis not present

## 2022-11-14 NOTE — Progress Notes (Signed)
REASON FOR VISIT:   Follow-up of asymptomatic left carotid stenosis.  MEDICAL ISSUES:   40 TO 59% LEFT CAROTID STENOSIS: She has an asymptomatic 40 to 59% left carotid stenosis which is stable.  I have ordered a follow-up carotid duplex scan in 1 year.  She will be seen by the PA at that time.  She understands we would only consider carotid endarterectomy if she developed new left hemispheric symptoms or the stenosis progressed to greater than 80%.  She is on aspirin and is on Repatha.  PERIPHERAL ARTERIAL DISEASE: She has monophasic Doppler signals in both feet.  Currently I do not get any significant symptoms of claudication or rest pain.  When she comes back in a year we will get an arterial Doppler study to have as a baseline.  CHRONIC VENOUS INSUFFICIENCY: She does have some mild leg swelling and has symptoms at times consistent with venous hypertension.  Thus I think she has some underlying chronic venous insufficiency.  We have discussed the importance of leg elevation and the proper positioning for this.  I have asked her to try this before going to bed at night to see if this helps her symptoms.  HPI:   Donna Baldwin is a pleasant 81 y.o. female who I saw on 05/16/2022 with an asymptomatic 40 to 59% left carotid stenosis.  The patient had temporary blindness in the right eye but no significant right carotid stenosis.  I did not get any clear-cut symptoms of amaurosis fugax in the left eye.  She had started aspirin and was also being started on Repatha for her cholesterol given that she has problems with statins.  I recommended a follow-up duplex scan in 6 months.  She comes in for that visit.  Since I saw her last, she denies any history of stroke, TIAs, expressive or receptive aphasia, or amaurosis fugax.  She remains on 81 mg of aspirin and Repatha.  Her main complaint is at night she feels swelling in her legs and feels like blood is pooling in her legs.  I do not get any  history of rest pain.  She denies any history of claudication.  She denies any history of nonhealing ulcers.  Risk factors for peripheral arterial disease include hypertension and hypercholesterolemia.  She denies any history of diabetes, family history of premature cardiovascular disease, or tobacco use.  Past Medical History:  Diagnosis Date   Acid reflux    Anemia    Anxiety    Back pain    GERD (gastroesophageal reflux disease)    Hyperlipidemia    Hypertensive heart disease without heart failure    Thrombocytopenia (HCC)    Vitamin D deficiency     Family History  Problem Relation Age of Onset   Cancer Father    Allergic rhinitis Neg Hx    Angioedema Neg Hx    Asthma Neg Hx    Atopy Neg Hx    Eczema Neg Hx    Immunodeficiency Neg Hx    Urticaria Neg Hx     SOCIAL HISTORY: Social History   Tobacco Use   Smoking status: Never   Smokeless tobacco: Never  Substance Use Topics   Alcohol use: No    Alcohol/week: 0.0 standard drinks of alcohol    Allergies  Allergen Reactions   Beef Allergy Anaphylaxis   Beef-Derived Products Anaphylaxis   Codeine Hives and Anxiety   Hydralazine Hives and Shortness Of Breath   Iodine Anaphylaxis   Iodine-131  Anaphylaxis   Ivp Dye [Iodinated Contrast Media] Anaphylaxis and Other (See Comments)   Latex Rash   Morphine And Related Other (See Comments)    Tremors, increased heart rate, excitement, confusion per pt   Penicillins Hives    Has patient had a PCN reaction causing immediate rash, facial/tongue/throat swelling, SOB or lightheadedness with hypotension: Yes Has patient had a PCN reaction causing severe rash involving mucus membranes or skin necrosis: Yes Has patient had a PCN reaction that required hospitalization: Yes Has patient had a PCN reaction occurring within the last 10 years: No If all of the above answers are "NO", then may proceed with Cephalosporin use.    Sertraline Other (See Comments)    Numbness in mouth  and hyperactivity   Shellfish Allergy Hives   Solu-Medrol [Methylprednisolone] Other (See Comments)    Numbness and tingling in mouth   Sulfa Antibiotics Hives and Rash   Sulfasalazine Hives and Rash   Barbiturates Other (See Comments)    Excitement    Bee Pollen Hives   Bee Venom Hives   Diovan [Valsartan] Nausea Only   Gabapentin Other (See Comments)    Visual disturbance   Livalo [Pitavastatin] Swelling and Other (See Comments)    Facial swelling   Nutritional Supplements     Other reaction(s): Other (See Comments) Nuts, strawberries, bicarbonate of soda, Nuts, strawberries, bicarbonate of soda,   Other Palpitations, Other (See Comments) and Rash    NARCOTIC ANALGESICS-TREMORS, INCREASED HEART RATE Hmg-Coa Reductase Inhibitors - intolerance unknown    Oxycodone Swelling, Rash and Cough   Promethazine-Dm Other (See Comments)    "Felt funny"   Tape Rash and Other (See Comments)    Red blotches    Wellness Essentials Blood Sugr [Nutritional Supplements] Other (See Comments)    Nuts, strawberries, bicarbonate of soda,   Albuterol Other (See Comments)    Caused wheezing   Ciprofloxacin Other (See Comments)   Crestor [Rosuvastatin] Other (See Comments)   Diltiazem Hcl     Other reaction(s): foot sores   Ezetimibe     Other reaction(s): myalgias   Famotidine Other (See Comments)    Unknown reaction   Oxycodone Hcl     Other reaction(s): hives, rash   Pred Forte [Prednisolone Acetate] Other (See Comments)    Patient is seeing odd color and images while using this   Welchol [Colesevelam]     Other reaction(s): myalgias   Celebrex [Celecoxib] Swelling and Other (See Comments)    Swelling and numbness (mouth)   Lanolin Itching   Mobic [Meloxicam] Swelling and Other (See Comments)    Swelling and numbness in mouth   Nickel Rash   Nitrates, Organic Rash and Other (See Comments)    Chest tightness also   Petrolatum Rash and Other (See Comments)   Soy Allergy Rash    Strawberry Extract Rash    Current Outpatient Medications  Medication Sig Dispense Refill   acetaminophen (TYLENOL) 500 MG tablet 2 tablets     Calcium Carbonate Antacid (TUMS CHEWY BITES PO) Take 1 tablet by mouth daily as needed (acid reflux).     cyanocobalamin (VITAMIN B12) 500 MCG tablet Take 500 mcg by mouth daily.     EPINEPHrine (EPIPEN 2-PAK) 0.3 mg/0.3 mL IJ SOAJ injection Inject 0.3 mg into the muscle as needed for anaphylaxis. 2 each 1   EPINEPHrine 0.3 mg/0.3 mL IJ SOAJ injection Inject 0.3 mg into the muscle once as needed (for anaphylaxis).  1   Evolocumab (REPATHA SURECLICK) 423  MG/ML SOAJ Inject 140 mg into the skin every 14 (fourteen) days. 2 mL 3   ezetimibe (ZETIA) 10 MG tablet Take 1 tablet by mouth daily.     hydrALAZINE (APRESOLINE) 25 MG tablet Take 1 tablet (25 mg total) by mouth every 4 (four) hours as needed (SBP > 150 mm Hg.). 60 tablet 2   LORazepam (ATIVAN) 0.5 MG tablet Take 0.5 mg by mouth daily as needed for anxiety.     mometasone (ELOCON) 0.1 % cream Apply 1 application topically daily.     nebivolol (BYSTOLIC) 5 MG tablet Take 1 tablet (5 mg total) by mouth daily. 30 tablet 2   NIFEdipine (ADALAT CC) 60 MG 24 hr tablet Take 1 tablet (60 mg total) by mouth daily. 90 tablet 2   Pyridoxine HCl (VITAMIN B6) 50 MG TABS      rosuvastatin (CRESTOR) 5 MG tablet Take 1 tablet (5 mg total) by mouth every other day. 15 tablet 2   tizanidine (ZANAFLEX) 2 MG capsule Take by mouth.     Vitamin D, Ergocalciferol, (DRISDOL) 1.25 MG (50000 UNIT) CAPS capsule Take 50,000 Units by mouth once a week.     cetirizine (ZYRTEC) 10 MG tablet Take 10 mg by mouth daily as needed for allergies.  (Patient not taking: Reported on 11/13/2022)     No current facility-administered medications for this visit.    REVIEW OF SYSTEMS:  '[X]'$  denotes positive finding, '[ ]'$  denotes negative finding Cardiac  Comments:  Chest pain or chest pressure:    Shortness of breath upon exertion:     Short of breath when lying flat:    Irregular heart rhythm:        Vascular    Pain in calf, thigh, or hip brought on by ambulation:    Pain in feet at night that wakes you up from your sleep:     Blood clot in your veins:    Leg swelling:  x       Pulmonary    Oxygen at home:    Productive cough:     Wheezing:         Neurologic    Sudden weakness in arms or legs:     Sudden numbness in arms or legs:     Sudden onset of difficulty speaking or slurred speech:    Temporary loss of vision in one eye:     Problems with dizziness:         Gastrointestinal    Blood in stool:     Vomited blood:         Genitourinary    Burning when urinating:     Blood in urine:        Psychiatric    Major depression:         Hematologic    Bleeding problems:    Problems with blood clotting too easily:        Skin    Rashes or ulcers:        Constitutional    Fever or chills:     PHYSICAL EXAM:   Vitals:   11/14/22 1119 11/14/22 1121  BP: (!) 150/73 (!) 153/70  Pulse: 62   Resp: 16   Temp: 98 F (36.7 C)   SpO2: 98%   Weight: 113 lb 14.4 oz (51.7 kg)   Height: '5\' 2"'$  (1.575 m)     GENERAL: The patient is a well-nourished female, in no acute distress. The vital signs are documented above. CARDIAC:  There is a regular rate and rhythm.  VASCULAR: I do not detect carotid bruits. She has normal femoral pulses. I cannot palpate pedal pulses. She has a monophasic dorsalis pedis and posterior tibial signal bilaterally. PULMONARY: There is good air exchange bilaterally without wheezing or rales. ABDOMEN: Soft and non-tender with normal pitched bowel sounds.  MUSCULOSKELETAL: There are no major deformities or cyanosis. NEUROLOGIC: No focal weakness or paresthesias are detected. SKIN: There are no ulcers or rashes noted. PSYCHIATRIC: The patient has a normal affect.  DATA:    CAROTID DUPLEX: I have independently interpreted her carotid duplex scan today.  On the right side  there is a less than 39% stenosis.  The right vertebral artery is patent with antegrade flow.  On the left side there is a 40 to 59% stenosis.  This is stable.  Left vertebral artery is patent with antegrade flow.  Deitra Mayo [facility] [phone]

## 2022-11-19 LAB — ALLERGEN PROFILE, SHELLFISH
Clam IgE: 0.18 kU/L — AB
F023-IgE Crab: 0.21 kU/L — AB
F080-IgE Lobster: 0.13 kU/L — AB
F290-IgE Oyster: 0.27 kU/L — AB
Scallop IgE: 0.19 kU/L — AB
Shrimp IgE: 0.33 kU/L — AB

## 2022-11-19 LAB — ALLERGEN SOYBEAN: Soybean IgE: 0.81 kU/L — AB

## 2022-11-19 LAB — CBC WITH DIFFERENTIAL
Basophils Absolute: 0 10*3/uL (ref 0.0–0.2)
Basos: 1 %
EOS (ABSOLUTE): 0 10*3/uL (ref 0.0–0.4)
Eos: 1 %
Hematocrit: 39.8 % (ref 34.0–46.6)
Hemoglobin: 12.1 g/dL (ref 11.1–15.9)
Immature Grans (Abs): 0 10*3/uL (ref 0.0–0.1)
Immature Granulocytes: 0 %
Lymphocytes Absolute: 1 10*3/uL (ref 0.7–3.1)
Lymphs: 31 %
MCH: 23 pg — ABNORMAL LOW (ref 26.6–33.0)
MCHC: 30.4 g/dL — ABNORMAL LOW (ref 31.5–35.7)
MCV: 76 fL — ABNORMAL LOW (ref 79–97)
Monocytes Absolute: 0.3 10*3/uL (ref 0.1–0.9)
Monocytes: 10 %
Neutrophils Absolute: 1.9 10*3/uL (ref 1.4–7.0)
Neutrophils: 57 %
RBC: 5.26 x10E6/uL (ref 3.77–5.28)
RDW: 14.7 % (ref 11.7–15.4)
WBC: 3.3 10*3/uL — ABNORMAL LOW (ref 3.4–10.8)

## 2022-11-19 LAB — ALPHA-GAL PANEL
Allergen Lamb IgE: 0.11 kU/L — AB
Beef IgE: <0.1 kU/L
IgE (Immunoglobulin E), Serum: 1668 IU/mL — ABNORMAL HIGH (ref 6–495)
O215-IgE Alpha-Gal: <0.1 kU/L
Pork IgE: <0.1 kU/L

## 2022-11-19 LAB — PEANUT COMPONENTS
F352-IgE Ara h 8: 2.77 kU/L — AB
F422-IgE Ara h 1: <0.1 kU/L
F423-IgE Ara h 2: <0.1 kU/L
F424-IgE Ara h 3: <0.1 kU/L
F427-IgE Ara h 9: <0.1 kU/L
F447-IgE Ara h 6: <0.1 kU/L

## 2022-11-19 LAB — IGE NUT PROF. W/COMPONENT RFLX
F017-IgE Hazelnut (Filbert): 10.8 kU/L — AB
F018-IgE Brazil Nut: 0.11 kU/L — AB
F020-IgE Almond: 1.79 kU/L — AB
F202-IgE Cashew Nut: 0.38 kU/L — AB
F203-IgE Pistachio Nut: 1.33 kU/L — AB
F256-IgE Walnut: 1.89 kU/L — AB
Macadamia Nut, IgE: 1.66 kU/L — AB
Peanut, IgE: 2.78 kU/L — AB
Pecan Nut IgE: 0.14 kU/L — AB

## 2022-11-19 LAB — CHRONIC URTICARIA: cu index: <3.8 (ref <10)

## 2022-11-19 LAB — ALLERGEN COMPONENT COMMENTS

## 2022-11-19 LAB — PANEL 604726
Cor A 1 IgE: 13.9 kU/L — AB
Cor A 14 IgE: <0.1 kU/L
Cor A 8 IgE: <0.1 kU/L
Cor A 9 IgE: 0.13 kU/L — AB

## 2022-11-19 LAB — PANEL 604721
Jug R 1 IgE: <0.1 kU/L
Jug R 3 IgE: 0.45 kU/L — AB

## 2022-11-19 LAB — ALLERGEN, CINNAMON, RF220: Allergen Cinnamon IgE: <0.1 kU/L

## 2022-11-19 LAB — PANEL 604350: Ber E 1 IgE: <0.1 kU/L

## 2022-11-19 LAB — ALLERGEN, APPLE F49: Allergen Apple, IgE: 1.67 kU/L — AB

## 2022-11-19 LAB — TRYPTASE: Tryptase: 3.4 ug/L (ref 2.2–13.2)

## 2022-11-19 LAB — PANEL 604239: ANA O 3 IgE: 0.5 kU/L — AB

## 2022-11-20 ENCOUNTER — Telehealth: Payer: Self-pay | Admitting: Allergy

## 2022-11-20 NOTE — Telephone Encounter (Signed)
Patient returned call to go over lab results. Would like a call back,   Best contact number: [phone]

## 2022-11-20 NOTE — Progress Notes (Signed)
Patient called back and was advised of lab results. She states that she is still having outbreaks and is wondering if she needs to be tested for oats, eggs, rice, corn, onions, sweet potato, and white potato. She states that she is still breaking out and having swelling in her feet. I advised that she can increase her zyrtec to 2 times a day to see if that help. Patient verbalized understanding.

## 2022-11-20 NOTE — Telephone Encounter (Signed)
Donna Baldwin has given pt her results via phone

## 2022-11-24 DIAGNOSIS — T24212A Burn of second degree of left thigh, initial encounter: Secondary | ICD-10-CM | POA: Diagnosis not present

## 2022-11-26 DIAGNOSIS — T24212D Burn of second degree of left thigh, subsequent encounter: Secondary | ICD-10-CM | POA: Diagnosis not present

## 2022-11-30 DIAGNOSIS — T24212D Burn of second degree of left thigh, subsequent encounter: Secondary | ICD-10-CM | POA: Diagnosis not present

## 2022-12-05 ENCOUNTER — Encounter: Payer: Self-pay | Admitting: Allergy

## 2022-12-05 ENCOUNTER — Ambulatory Visit: Payer: Medicare HMO | Admitting: Allergy

## 2022-12-05 ENCOUNTER — Other Ambulatory Visit: Payer: Self-pay

## 2022-12-05 VITALS — BP 136/72 | HR 100 | Temp 98.2°F | Resp 16 | Ht 64.0 in | Wt 114.2 lb

## 2022-12-05 DIAGNOSIS — T783XXD Angioneurotic edema, subsequent encounter: Secondary | ICD-10-CM

## 2022-12-05 DIAGNOSIS — L509 Urticaria, unspecified: Secondary | ICD-10-CM

## 2022-12-05 MED ORDER — MONTELUKAST SODIUM 10 MG PO TABS: 10.0000 mg | ORAL_TABLET | Freq: Every day | ORAL | 2 refills | Status: DC

## 2022-12-05 NOTE — Progress Notes (Signed)
Follow-up Note  RE: Donna Baldwin MRN: [id number] DOB: 1941-03-01 Date of Office Visit: 12/05/2022   History of present illness: Donna Baldwin is a 81 y.o. female presenting today for follow-up of urticaria. She presents today with her husband. She was last seen in the office on 11/13/2022.  She states she has continued to have hives since the last visit.  It is pretty much been a daily occurrence.  She states they tend to come on after eating.  They are itchy.  They come and go. She is not sure what she can eat as she states she has hives after she eats.  She states she has lost weight because she is scared to eat anything.  She has had allergy testing and I even did more food allergy testing at the last visit and she does have IgE to a variety of different food allergens.  At this time discussed with her that with continued hives I do not feel that the hives are being triggered by specific foods indicating food allergy.  Thus I have advised her to just eat as her hives are most likely not being caused by any specific food allergen. I did advise her to take Zyrtec twice a day since she was still having hives through [employer].  However she states that her husband reminded her that she can always take Zyrtec twice a day as recommended.  Review of systems: Review of Systems  Constitutional:  Positive for unexpected weight change.  HENT: Negative.    Eyes: Negative.   Respiratory: Negative.    Cardiovascular: Negative.   Gastrointestinal: Negative.   Musculoskeletal: Negative.   Skin:  Positive for rash.  Allergic/Immunologic: Negative.   Neurological: Negative.      All other systems negative unless noted above in HPI  Past medical/social/surgical/family history have been reviewed and are unchanged unless specifically indicated below.  No changes  Medication List: Current Outpatient Medications  Medication Sig Dispense Refill   acetaminophen (TYLENOL) 500  MG tablet 2 tablets     Calcium Carbonate Antacid (TUMS CHEWY BITES PO) Take 1 tablet by mouth daily as needed (acid reflux).     cetirizine (ZYRTEC) 10 MG tablet Take 10 mg by mouth daily as needed for allergies.     cyanocobalamin (VITAMIN B12) 500 MCG tablet Take 500 mcg by mouth daily.     EPINEPHrine (EPIPEN 2-PAK) 0.3 mg/0.3 mL IJ SOAJ injection Inject 0.3 mg into the muscle as needed for anaphylaxis. 2 each 1   EPINEPHrine 0.3 mg/0.3 mL IJ SOAJ injection Inject 0.3 mg into the muscle once as needed (for anaphylaxis).  1   Evolocumab (REPATHA SURECLICK) 546 MG/ML SOAJ Inject 140 mg into the skin every 14 (fourteen) days. 2 mL 3   ezetimibe (ZETIA) 10 MG tablet Take 1 tablet by mouth daily.     hydrALAZINE (APRESOLINE) 25 MG tablet Take 1 tablet (25 mg total) by mouth every 4 (four) hours as needed (SBP > 150 mm Hg.). 60 tablet 2   LORazepam (ATIVAN) 0.5 MG tablet Take 0.5 mg by mouth daily as needed for anxiety.     mometasone (ELOCON) 0.1 % cream Apply 1 application topically daily.     montelukast (SINGULAIR) 10 MG tablet Take 1 tablet (10 mg total) by mouth at bedtime. 30 tablet 2   nebivolol (BYSTOLIC) 5 MG tablet Take 1 tablet (5 mg total) by mouth daily. 30 tablet 2   NIFEdipine (ADALAT CC) 60 MG 24 hr  tablet Take 1 tablet (60 mg total) by mouth daily. 90 tablet 2   Pyridoxine HCl (VITAMIN B6) 50 MG TABS      rosuvastatin (CRESTOR) 5 MG tablet Take 1 tablet (5 mg total) by mouth every other day. 15 tablet 2   tizanidine (ZANAFLEX) 2 MG capsule Take by mouth.     Vitamin D, Ergocalciferol, (DRISDOL) 1.25 MG (50000 UNIT) CAPS capsule Take 50,000 Units by mouth once a week.     No current facility-administered medications for this visit.     Known medication allergies: Allergies  Allergen Reactions   Beef Allergy Anaphylaxis   Beef-Derived Products Anaphylaxis   Codeine Hives and Anxiety   Hydralazine Hives and Shortness Of Breath   Iodine Anaphylaxis   Iodine-131 Anaphylaxis    Ivp Dye [Iodinated Contrast Media] Anaphylaxis and Other (See Comments)   Latex Rash   Morphine And Related Other (See Comments)    Tremors, increased heart rate, excitement, confusion per pt   Penicillins Hives    Has patient had a PCN reaction causing immediate rash, facial/tongue/throat swelling, SOB or lightheadedness with hypotension: Yes Has patient had a PCN reaction causing severe rash involving mucus membranes or skin necrosis: Yes Has patient had a PCN reaction that required hospitalization: Yes Has patient had a PCN reaction occurring within the last 10 years: No If all of the above answers are "NO", then may proceed with Cephalosporin use.    Sertraline Other (See Comments)    Numbness in mouth and hyperactivity   Shellfish Allergy Hives   Solu-Medrol [Methylprednisolone] Other (See Comments)    Numbness and tingling in mouth   Sulfa Antibiotics Hives and Rash   Sulfasalazine Hives and Rash   Barbiturates Other (See Comments)    Excitement    Bee Pollen Hives   Bee Venom Hives   Diovan [Valsartan] Nausea Only   Gabapentin Other (See Comments)    Visual disturbance   Livalo [Pitavastatin] Swelling and Other (See Comments)    Facial swelling   Nutritional Supplements     Other reaction(s): Other (See Comments) Nuts, strawberries, bicarbonate of soda, Nuts, strawberries, bicarbonate of soda,   Other Palpitations, Other (See Comments) and Rash    NARCOTIC ANALGESICS-TREMORS, INCREASED HEART RATE Hmg-Coa Reductase Inhibitors - intolerance unknown    Oxycodone Swelling, Rash and Cough   Promethazine-Dm Other (See Comments)    "Felt funny"   Tape Rash and Other (See Comments)    Red blotches    Wellness Essentials Blood Sugr [Nutritional Supplements] Other (See Comments)    Nuts, strawberries, bicarbonate of soda,   Albuterol Other (See Comments)    Caused wheezing   Ciprofloxacin Other (See Comments)   Crestor [Rosuvastatin] Other (See Comments)   Diltiazem  Hcl     Other reaction(s): foot sores   Ezetimibe     Other reaction(s): myalgias   Famotidine Other (See Comments)    Unknown reaction   Oxycodone Hcl     Other reaction(s): hives, rash   Pred Forte [Prednisolone Acetate] Other (See Comments)    Patient is seeing odd color and images while using this   Welchol [Colesevelam]     Other reaction(s): myalgias   Celebrex [Celecoxib] Swelling and Other (See Comments)    Swelling and numbness (mouth)   Lanolin Itching   Mobic [Meloxicam] Swelling and Other (See Comments)    Swelling and numbness in mouth   Nickel Rash   Nitrates, Organic Rash and Other (See Comments)    Chest  tightness also   Petrolatum Rash and Other (See Comments)   Soy Allergy Rash   Strawberry Extract Rash     Physical examination: Blood pressure 136/72, pulse 100, temperature 98.2 F (36.8 C), temperature source Temporal, resp. rate 16, height '5\' 4"'$  (1.626 m), weight 114 lb 3.2 oz (51.8 kg), SpO2 100 %.  General: Alert, interactive, in no acute distress. HEENT: PERRLA, TMs pearly gray, turbinates non-edematous without discharge, post-pharynx non erythematous. Neck: Supple without lymphadenopathy. Lungs: Clear to auscultation without wheezing, rhonchi or rales. {no increased work of breathing. CV: Normal S1, S2 without murmurs. Abdomen: Nondistended, nontender. Skin: Warm and dry, without lesions or rashes. Extremities:  No clubbing, cyanosis or edema. Neuro:   Grossly intact.  Diagnositics/Labs: None today  Assessment and plan: Hives and swelling - at this time etiology of hives and swelling appears to be spontaneous.  Most commonly hives that continue to appear for days to weeks is usually not do to a food allergen.  I believe your hives are coincidental with eating and regardless of what you eat you were going to have hives.   Thus would not restrict your diet any further.   Hives can be caused by a variety of different triggers including  illness/infection, foods, medications, stings, exercise, pressure, vibrations, extremes of temperature to name a few however majority of the time there is no identifiable trigger.   - continue Zyrtec '10mg'$  1 tab twice daily at this time.  - start Singulair '10mg'$  1 tab daily at bedtime at this time - Xolair monthly injections discussed to day as an add-on therapy to help control your hives better.  Benefits, risks and protocol and mechanism of action discussed today.  Informational brochure provided.  Will have Tammy call you to discuss ordering process.   - you have access to self-injectable epinephrine Epipen 0.'3mg'$  at all times - follow emergency action plan in case of allergic reaction   Follow-up in 3 months or sooner if needed  I appreciate the opportunity to take part in Donna Baldwin's care. Please do not hesitate to contact me with questions.  Sincerely,   Prudy Feeler, MD Allergy/Immunology [facility]

## 2022-12-05 NOTE — Patient Instructions (Addendum)
Hives and swelling - at this time etiology of hives and swelling appears to be spontaneous.  Most commonly hives that continue to appear for days to weeks is usually not do to a food allergen.  I believe your hives are coincidental with eating and regardless of what you eat you were going to have hives.   Thus would not restrict your diet any further.   Hives can be caused by a variety of different triggers including illness/infection, foods, medications, stings, exercise, pressure, vibrations, extremes of temperature to name a few however majority of the time there is no identifiable trigger.   - continue Zyrtec '10mg'$  1 tab twice daily at this time.  - start Singulair '10mg'$  1 tab daily at bedtime at this time - Xolair monthly injections discussed to day as an add-on therapy to help control your hives better.  Benefits, risks and protocol and mechanism of action discussed today.  Informational brochure provided.  Will have Tammy call you to discuss ordering process.   - you have access to self-injectable epinephrine Epipen 0.'3mg'$  at all times - follow emergency action plan in case of allergic reaction   Follow-up in 3 months or sooner if needed

## 2022-12-13 ENCOUNTER — Other Ambulatory Visit: Payer: Self-pay

## 2022-12-13 ENCOUNTER — Encounter (HOSPITAL_COMMUNITY): Payer: Self-pay

## 2022-12-13 ENCOUNTER — Emergency Department (HOSPITAL_COMMUNITY)
Admission: EM | Admit: 2022-12-13 | Discharge: 2022-12-13 | Discharge status: Against Medical Advice | Payer: Medicare HMO | Attending: Emergency Medicine | Admitting: Emergency Medicine

## 2022-12-13 DIAGNOSIS — Z5321 Procedure and treatment not carried out due to patient leaving prior to being seen by health care provider: Secondary | ICD-10-CM | POA: Insufficient documentation

## 2022-12-13 DIAGNOSIS — R220 Localized swelling, mass and lump, head: Secondary | ICD-10-CM | POA: Diagnosis not present

## 2022-12-13 NOTE — ED Triage Notes (Signed)
Pt arrived POV from home stating she woke up at 2am this morning and was itching behind her ears and then she noticed she had little red spots on her arms and then she felt like her bottom lip was swollen. Pt denies any new foods, soaps, lotions etc.

## 2022-12-13 NOTE — ED Notes (Signed)
Pt tried to call PCP and see if they can get her in. They recommended she go to [facility] to be seen. She is wanting to leave.

## 2022-12-16 DIAGNOSIS — L308 Other specified dermatitis: Secondary | ICD-10-CM | POA: Diagnosis not present

## 2022-12-16 DIAGNOSIS — L309 Dermatitis, unspecified: Secondary | ICD-10-CM | POA: Diagnosis not present

## 2022-12-16 DIAGNOSIS — L282 Other prurigo: Secondary | ICD-10-CM | POA: Diagnosis not present

## 2022-12-26 ENCOUNTER — Encounter: Payer: Self-pay | Admitting: Cardiology

## 2022-12-26 ENCOUNTER — Ambulatory Visit: Payer: Medicare HMO | Admitting: Cardiology

## 2022-12-26 VITALS — BP 130/78 | HR 104 | Ht 65.0 in | Wt 113.4 lb

## 2022-12-26 DIAGNOSIS — I700 Atherosclerosis of aorta: Secondary | ICD-10-CM | POA: Diagnosis not present

## 2022-12-26 DIAGNOSIS — L509 Urticaria, unspecified: Secondary | ICD-10-CM | POA: Diagnosis not present

## 2022-12-26 DIAGNOSIS — E782 Mixed hyperlipidemia: Secondary | ICD-10-CM | POA: Diagnosis not present

## 2022-12-26 DIAGNOSIS — I10 Essential (primary) hypertension: Secondary | ICD-10-CM | POA: Diagnosis not present

## 2022-12-26 MED ORDER — FAMOTIDINE 20 MG PO TABS: 20.0000 mg | ORAL_TABLET | Freq: Two times a day (BID) | ORAL | 1 refills | Status: DC

## 2022-12-26 MED ORDER — HYDROXYZINE HCL 10 MG PO TABS: 10.0000 mg | ORAL_TABLET | Freq: Three times a day (TID) | ORAL | 2 refills | Status: DC | PRN

## 2022-12-26 NOTE — Progress Notes (Signed)
Primary Physician/Referring:  Harlan Stains, MD  Patient ID: Donna Baldwin, female    DOB: Feb 18, 1941, 81 y.o.   MRN: [id number]  Chief Complaint  Patient presents with   Hypertension   Follow-up    Hives Chest pain   HPI:    Donna Baldwin  is a 81 y.o. AAF with no significant coronary disease but has mildly elevated coronary calcium score with noncritical distal left main minimal disease by coronary CTA in 2020, coronary calcium score in the 50th percentile, difficult to control hypertension, asymptomatic bilateral carotid stenosis being followed by vascular surgery, multiple medication allergies and intolerance of statins, achalasia cardia SP surgery in 2019, severe vitamin D deficiency, chronic thrombocytopenia felt to be ITP asymptomatic.   Patient had been doing well on Procardia and blood pressure has been well-controlled however has developed a rash and wanted to see me and made an appointment. She was supposed to be 6 months ago but made an appointment as she has developed significant rash on her body although she tolerated Procardia for most 5 months.  She is very distressed about this.   Past Medical History:  Diagnosis Date   Acid reflux    Anemia    Anxiety    Back pain    GERD (gastroesophageal reflux disease)    Hyperlipidemia    Hypertensive heart disease without heart failure    Thrombocytopenia (HCC)    Vitamin D deficiency    Past Surgical History:  Procedure Laterality Date   ESOPHAGEAL MANOMETRY N/A 03/15/2020   Procedure: ESOPHAGEAL MANOMETRY (EM);  Surgeon: Wilford Corner, MD;  Location: [facility];  Service: Endoscopy;  Laterality: N/A;   EYE SURGERY     HELLER MYOTOMY N/A 04/18/2020   Procedure: LAPAROSCOPIC HELLER MYOTOMY WITH UPPER ENDOSCOPY;  Surgeon: Kinsinger, Arta Bruce, MD;  Location: [facility];  Service: General;  Laterality: N/A;   [place] IMPEDANCE STUDY N/A 03/15/2020   Procedure: [place] IMPEDANCE STUDY;  Surgeon: Wilford Corner, MD;   Location: [facility];  Service: Endoscopy;  Laterality: N/A;   SHOULDER SURGERY Bilateral    TONSILLECTOMY     Family History  Problem Relation Age of Onset   Cancer Father    Allergic rhinitis Neg Hx    Angioedema Neg Hx    Asthma Neg Hx    Atopy Neg Hx    Eczema Neg Hx    Immunodeficiency Neg Hx    Urticaria Neg Hx     Social History   Tobacco Use   Smoking status: Never   Smokeless tobacco: Never  Substance Use Topics   Alcohol use: No    Alcohol/week: 0.0 standard drinks of alcohol   Marital Status: Married  ROS  Review of Systems  Cardiovascular:  Negative for chest pain, dyspnea on exertion and leg swelling.   Objective      12/26/2022    2:43 PM 12/26/2022    1:59 PM 12/13/2022    8:40 AM  Vitals with BMI  Height  _0  _1   Weight  113 lbs 6 oz 113 lbs  BMI  16.01 09.3  Systolic 235 573 220  Diastolic 78 84 91  Pulse  104 104   Today's Vitals   12/26/22 1359 12/26/22 1443  BP: (!) 181/84 130/78  Pulse: (!) 104   SpO2: 96%   Weight: 113 lb 6.4 oz (51.4 kg)   Height: _2  (1.651 m)     Body mass index is 18.87 kg/m.  Physical Exam Neck:  Vascular: No carotid bruit or JVD.  Cardiovascular:     Rate and Rhythm: Normal rate and regular rhythm.     Pulses: Intact distal pulses.          Carotid pulses are  on the right side with bruit and  on the left side with bruit.      Dorsalis pedis pulses are 0 on the right side and 0 on the left side.       Posterior tibial pulses are 1+ on the right side and 1+ on the left side.     Heart sounds: Normal heart sounds. No murmur heard.    No gallop.  Pulmonary:     Effort: Pulmonary effort is normal.     Breath sounds: Normal breath sounds.  Abdominal:     General: Bowel sounds are normal.     Palpations: Abdomen is soft.  Musculoskeletal:     Right lower leg: No edema.     Left lower leg: No edema.    Medications and allergies   Allergies  Allergen Reactions   Beef Allergy Anaphylaxis    Beef-Derived Products Anaphylaxis   Codeine Hives and Anxiety   Hydralazine Hives and Shortness Of Breath   Iodine Anaphylaxis   Iodine-131 Anaphylaxis   Ivp Dye [Iodinated Contrast Media] Anaphylaxis and Other (See Comments)   Latex Rash   Morphine And Related Other (See Comments)    Tremors, increased heart rate, excitement, confusion per pt   Penicillins Hives    Has patient had a PCN reaction causing immediate rash, facial/tongue/throat swelling, SOB or lightheadedness with hypotension: Yes Has patient had a PCN reaction causing severe rash involving mucus membranes or skin necrosis: Yes Has patient had a PCN reaction that required hospitalization: Yes Has patient had a PCN reaction occurring within the last 10 years: No If all of the above answers are "NO", then may proceed with Cephalosporin use.    Sertraline Other (See Comments)    Numbness in mouth and hyperactivity   Shellfish Allergy Hives   Solu-Medrol [Methylprednisolone] Other (See Comments)    Numbness and tingling in mouth   Sulfa Antibiotics Hives and Rash   Sulfasalazine Hives and Rash   Barbiturates Other (See Comments)    Excitement    Bee Pollen Hives   Bee Venom Hives   Diovan [Valsartan] Nausea Only   Gabapentin Other (See Comments)    Visual disturbance   Livalo [Pitavastatin] Swelling and Other (See Comments)    Facial swelling   Nutritional Supplements     Other reaction(s): Other (See Comments) Nuts, strawberries, bicarbonate of soda, Nuts, strawberries, bicarbonate of soda,   Other Palpitations, Other (See Comments) and Rash    NARCOTIC ANALGESICS-TREMORS, INCREASED HEART RATE Hmg-Coa Reductase Inhibitors - intolerance unknown    Oxycodone Swelling, Rash and Cough   Promethazine-Dm Other (See Comments)    "Felt funny"   Tape Rash and Other (See Comments)    Red blotches    Wellness Essentials Blood Sugr [Nutritional Supplements] Other (See Comments)    Nuts, strawberries, bicarbonate of  soda,   Albuterol Other (See Comments)    Caused wheezing   Ciprofloxacin Other (See Comments)   Crestor [Rosuvastatin] Other (See Comments)   Diltiazem Hcl     Other reaction(s): foot sores   Ezetimibe     Other reaction(s): myalgias   Famotidine Other (See Comments)    Unknown reaction   Oxycodone Hcl     Other reaction(s): hives, rash   Pred  Forte [Prednisolone Acetate] Other (See Comments)    Patient is seeing odd color and images while using this   Welchol [Colesevelam]     Other reaction(s): myalgias   Celebrex [Celecoxib] Swelling and Other (See Comments)    Swelling and numbness (mouth)   Lanolin Itching   Mobic [Meloxicam] Swelling and Other (See Comments)    Swelling and numbness in mouth   Nickel Rash   Nitrates, Organic Rash and Other (See Comments)    Chest tightness also   Petrolatum Rash and Other (See Comments)   Soy Allergy Rash   Strawberry Extract Rash     Medication list after today's encounter   Current Outpatient Medications:    acetaminophen (TYLENOL) 500 MG tablet, 2 tablets, Disp: , Rfl:    Calcium Carbonate Antacid (TUMS CHEWY BITES PO), Take 1 tablet by mouth daily as needed (acid reflux)., Disp: , Rfl:    cetirizine (ZYRTEC) 10 MG tablet, Take 10 mg by mouth daily as needed for allergies., Disp: , Rfl:    EPINEPHrine (EPIPEN 2-PAK) 0.3 mg/0.3 mL IJ SOAJ injection, Inject 0.3 mg into the muscle as needed for anaphylaxis., Disp: 2 each, Rfl: 1   EPINEPHrine 0.3 mg/0.3 mL IJ SOAJ injection, Inject 0.3 mg into the muscle once as needed (for anaphylaxis)., Disp: , Rfl: 1   famotidine (PEPCID) 20 MG tablet, Take 1 tablet (20 mg total) by mouth 2 (two) times daily., Disp: 180 tablet, Rfl: 1   hydrALAZINE (APRESOLINE) 25 MG tablet, Take 1 tablet (25 mg total) by mouth every 4 (four) hours as needed (SBP > 150 mm Hg.)., Disp: 60 tablet, Rfl: 2   hydrOXYzine (ATARAX) 10 MG tablet, Take 1 tablet (10 mg total) by mouth 3 (three) times daily as needed., Disp: 90  tablet, Rfl: 2   LORazepam (ATIVAN) 0.5 MG tablet, Take 0.5 mg by mouth daily as needed for anxiety., Disp: , Rfl:    mometasone (ELOCON) 0.1 % cream, Apply 1 application topically daily., Disp: , Rfl:    NIFEdipine (ADALAT CC) 60 MG 24 hr tablet, Take 1 tablet (60 mg total) by mouth daily., Disp: 90 tablet, Rfl: 2   tizanidine (ZANAFLEX) 2 MG capsule, Take by mouth., Disp: , Rfl:    triamcinolone cream (KENALOG) 0.1 %, Apply 1 Application topically 2 (two) times daily., Disp: , Rfl:    Vitamin D, Ergocalciferol, (DRISDOL) 1.25 MG (50000 UNIT) CAPS capsule, Take 50,000 Units by mouth once a week., Disp: , Rfl:    Pyridoxine HCl (VITAMIN B6) 50 MG TABS, , Disp: , Rfl:   Laboratory examination:   Lab Results  Component Value Date   NA 139 08/29/2022   K 4.2 08/29/2022   CO2 21 08/29/2022   GLUCOSE 121 (H) 08/29/2022   BUN 14 08/29/2022   CREATININE 0.87 08/29/2022   CALCIUM 9.7 08/29/2022   EGFR 67 08/29/2022   GFRNONAA >60 08/06/2022   GFRNONAA >60 08/06/2022       Latest Ref Rng & Units 08/29/2022   10:14 AM 08/06/2022    2:09 AM 08/05/2022    2:50 PM  BMP  Glucose 70 - 99 mg/dL 121  104  117   BUN 8 - 27 mg/dL _0 Creatinine 0.57 - 1.00 mg/dL 0.87  0.72    0.72  0.87   BUN/Creat Ratio 12 - 28 16     Sodium 134 - 144 mmol/L 139  139  140   Potassium 3.5 - 5.2 mmol/L  4.2  3.5  4.0   Chloride 96 - 106 mmol/L 102  109  108   CO2 20 - 29 mmol/L _0 Calcium 8.7 - 10.3 mg/dL 9.7  9.0  9.6       Latest Ref Rng & Units 08/29/2022   10:14 AM 04/11/2021    1:00 PM 07/06/2018    4:54 AM  Hepatic Function  Total Protein 6.0 - 8.5 g/dL 6.8  7.5  6.3   Albumin 3.8 - 4.8 g/dL 4.6  4.2  3.7   AST 0 - 40 IU/L _1 ALT 0 - 32 IU/L _2 Alk Phosphatase 44 - 121 IU/L 98  95  81   Total Bilirubin 0.0 - 1.2 mg/dL 0.4  0.4  0.5    Lab Results  Component Value Date   CHOL 256 (H) 12/18/2021   HDL 86 12/18/2021   LDLCALC 159 (H) 12/18/2021   TRIG 67  12/18/2021   CHOLHDL 2.6 10/31/2020     HEMOGLOBIN A1C No results found for: "HGBA1C", "MPG" TSH Recent Labs    08/06/22 0119  TSH 5.293*   Component Ref Range & Units 08/06/2022  Vit D, 25-Hydroxy 30 - 100 ng/mL 21.70 Low     Radiology:    Cardiac Studies:   Renal artery duplex  07/30/2018: No E/O renal artery stenosis.   Sleep study 10/04/2021: No significant OSA. A critically low heart rate during PSG was present in wake and in sleep during recording, HR often in the 30s. A higher heart rate may prevent some arousals? No significant hypoxemia noted.   Coronary CTA 12/08/2019: 1. Coronary artery calcium score 108 Agatston units,  54th percentile for age and gender, suggesting intermediate risk for future cardiac events. 2.  Nonobstructive ostial left main calcified plaque.   PCV ECHOCARDIOGRAM COMPLETE 07/04/2021 Left ventricle cavity is normal in size and wall thickness. Normal global wall motion. Normal LV systolic function with EF 54%. Doppler evidence of grade II (pseudonormal) diastolic dysfunction, elevated LAP. Calculated EF 54%. Left atrial cavity is mildly dilated. Mild (Grade I) mitral regurgitation. Mild tricuspid regurgitation. No evidence of pulmonary hypertension.   Ambulatory cardiac telemetry 06/05/2021 - 06/12/2021: Predominant rhythm was sinus.  Maximum heart rate 125, minimum heart rate 32, average heart rate 47 bpm.  Patient with 2 episodes of atrial tachycardia with longest lasting 12.6 seconds.  Patient had single sinus pause lasting 3.1 seconds at 4:30 AM, asymptomatic.  Patient triggered events correlated with sinus bradycardia, PVCs, PACs, and ventricular trigeminy.  No evidence of high degree AV block or ventricular tachycardia.   EKG:   EKG 12/26/2022: Sinus tachycardia at rate of _3 bpm, leftward axis.  Incomplete right bundle branch block.  No evidence of ischemia.  Compared to 07/04/2021, no significant change.  Previous heart rate was 61  bpm.  08/05/2022: Marked sinus bradycardia at the rate of 43 bpm otherwise normal EKG.  Compared to 07/04/2022, heart rate was 61 bpm.  Compared to 06/05/2021, no significant change.    Assessment     ICD-10-CM   1. Primary hypertension  I10 EKG 12-Lead    2. Mixed hyperlipidemia  E78.2     3. Aortic atherosclerosis (HCC)  I70.0     4. Urticaria of unknown origin  L50.9 hydrOXYzine (ATARAX) 10 MG tablet    famotidine (PEPCID) 20 MG tablet       Orders Placed This  Encounter  Procedures   EKG 12-Lead    Meds ordered this encounter  Medications   hydrOXYzine (ATARAX) 10 MG tablet    Sig: Take 1 tablet (10 mg total) by mouth 3 (three) times daily as needed.    Dispense:  90 tablet    Refill:  2   famotidine (PEPCID) 20 MG tablet    Sig: Take 1 tablet (20 mg total) by mouth 2 (two) times daily.    Dispense:  180 tablet    Refill:  1    Medications Discontinued During This Encounter  Medication Reason   cyanocobalamin (VITAMIN B12) 500 MCG tablet Patient Preference   Evolocumab (REPATHA SURECLICK) 106 MG/ML SOAJ Prescription never filled   ezetimibe (ZETIA) 10 MG tablet Discontinued by provider   rosuvastatin (CRESTOR) 5 MG tablet Discontinued by provider   nebivolol (BYSTOLIC) 5 MG tablet Patient Preference   montelukast (SINGULAIR) 10 MG tablet Patient Preference      Recommendations:   MARIMAR SUBER is a 81 y.o.  AAF with no significant coronary disease but has mildly elevated coronary calcium score with noncritical distal left main minimal disease by coronary CTA in 2020, coronary calcium score in the 50th percentile, difficult to control hypertension, asymptomatic bilateral carotid stenosis being followed by vascular surgery, multiple medication allergies and intolerance of statins, achalasia cardia SP surgery in 2019, severe vitamin D deficiency, chronic thrombocytopenia felt to be ITP asymptomatic.   She was supposed to be 6 months ago but made an appointment as she  has developed significant rash on her body although she tolerated Procardia for most 5 months.  She is very distressed about this.  1. Primary hypertension Patient blood pressure has been difficult to control due to multiple patient medication allergies.  She had been tolerating Procardia XL without any problems but now has developed significant hives, I reviewed her pictures that she has taken on her phone.  I think this is related to urticaria of unknown etiology and not necessarily related to a particular drug.  I would like to try Pepcid 20 mg p.o. twice daily and also hydroxyzine 10 mg 3 times daily as needed and advised her to continue Procardia XL 60 mg daily.  2. Mixed hyperlipidemia Patient has aortic atherosclerosis and also moderate stenosis of the carotid arteries being followed by vascular surgery.  Her LDL is markedly elevated but she is very afraid to try any medications including PCSK9 inhibitors for now.  States that she is willing to try the shots but she wants to settle down until next office visit with me to see if her rash will go away before trying a new medication.  3. Aortic atherosclerosis ([place]) As dictated above due to aortic atherosclerosis and carotid disease, would like to attempt any kind of a statin or lipid-lowering therapy.  4. Urticaria of unknown origin I will try Pepcid and hydroxyzine.  I will see her back in 3 months for follow-up.  Advised her stress could be playing a major role in urticaria flareup.   Adrian Prows, MD, [facility] 12/26/2022, 2:47 PM Office: [phone]

## 2023-01-02 DIAGNOSIS — L609 Nail disorder, unspecified: Secondary | ICD-10-CM | POA: Diagnosis not present

## 2023-01-02 DIAGNOSIS — I10 Essential (primary) hypertension: Secondary | ICD-10-CM | POA: Diagnosis not present

## 2023-01-02 DIAGNOSIS — R002 Palpitations: Secondary | ICD-10-CM | POA: Diagnosis not present

## 2023-01-02 DIAGNOSIS — Z91018 Allergy to other foods: Secondary | ICD-10-CM | POA: Diagnosis not present

## 2023-01-02 DIAGNOSIS — E1169 Type 2 diabetes mellitus with other specified complication: Secondary | ICD-10-CM | POA: Diagnosis not present

## 2023-01-02 DIAGNOSIS — L509 Urticaria, unspecified: Secondary | ICD-10-CM | POA: Diagnosis not present

## 2023-01-07 ENCOUNTER — Telehealth: Payer: Self-pay | Admitting: Internal Medicine

## 2023-01-07 NOTE — Telephone Encounter (Signed)
Patient called to r/s 1/11 appointment to a day with a later appointment time due to travel time. Patient r/s and notified.

## 2023-01-08 ENCOUNTER — Ambulatory Visit: Payer: Medicare HMO | Admitting: Internal Medicine

## 2023-01-08 ENCOUNTER — Other Ambulatory Visit: Payer: Medicare HMO

## 2023-01-09 ENCOUNTER — Inpatient Hospital Stay: Payer: Medicare HMO

## 2023-01-09 ENCOUNTER — Inpatient Hospital Stay: Payer: Medicare HMO | Admitting: Internal Medicine

## 2023-01-14 ENCOUNTER — Encounter: Payer: Self-pay | Admitting: Podiatry

## 2023-01-14 ENCOUNTER — Ambulatory Visit (INDEPENDENT_AMBULATORY_CARE_PROVIDER_SITE_OTHER): Payer: Medicare PPO | Admitting: Podiatry

## 2023-01-14 DIAGNOSIS — G609 Hereditary and idiopathic neuropathy, unspecified: Secondary | ICD-10-CM | POA: Diagnosis not present

## 2023-01-14 DIAGNOSIS — B351 Tinea unguium: Secondary | ICD-10-CM

## 2023-01-14 DIAGNOSIS — M2042 Other hammer toe(s) (acquired), left foot: Secondary | ICD-10-CM

## 2023-01-14 DIAGNOSIS — M79674 Pain in right toe(s): Secondary | ICD-10-CM | POA: Diagnosis not present

## 2023-01-14 DIAGNOSIS — L853 Xerosis cutis: Secondary | ICD-10-CM | POA: Diagnosis not present

## 2023-01-14 DIAGNOSIS — M79675 Pain in left toe(s): Secondary | ICD-10-CM | POA: Diagnosis not present

## 2023-01-14 DIAGNOSIS — M2041 Other hammer toe(s) (acquired), right foot: Secondary | ICD-10-CM

## 2023-01-14 MED ORDER — CICLOPIROX 8 % EX SOLN: Freq: Every day | CUTANEOUS | 0 refills | Status: DC

## 2023-01-14 NOTE — Progress Notes (Signed)
  Subjective:  Patient ID: Donna Baldwin, female    DOB: 1941-01-11,  MRN: [id number]  Chief Complaint  Patient presents with   Diabetes    NP  diabetic foot care   Nail Problem    Thick painful toenails    82 y.o. female presents with the above complaint. History confirmed with patient.  Her A1c was previously uncontrolled she is now back down in the prediabetic range, not on current medication for her and her nails are thickened elongated and cause discomfort.  She notes that her toes are curling and curling under.  She also notes a feeling of tightness around her foot at the end of the night  Objective:  Physical Exam: warm, good capillary refill, no trophic changes or ulcerative lesions, normal DP and PT pulses, normal monofilament exam, normal sensory exam, and lesser hammertoe deformities reducible bilateral, hallux valgus deformity bilateral. Left Foot: dystrophic yellowed discolored nail plates with subungual debris Right Foot: dystrophic yellowed discolored nail plates with subungual debris   Assessment:   1. Pain due to onychomycosis of toenails of both feet   2. Hammertoe of left foot   3. Hammertoe of right foot   4. Xerosis cutis   5. Idiopathic peripheral neuropathy      Plan:  Patient was evaluated and treated and all questions answered.  Discussed the etiology and treatment options for the condition in detail with the patient. Educated patient on the topical and oral treatment options for mycotic nails. Recommended debridement of the nails today. Sharp and mechanical debridement performed of all painful and mycotic nails today. Nails debrided in length and thickness using a nail nipper to level of comfort. Discussed treatment options including appropriate shoe gear. Follow up as needed for painful nails.  She would like to return for routine regular debridements for this to maintain the nail length and thickness.  We discussed the etiologies of the toe  deformities and I recommended nonoperative treatment and we discussed shoe gear for this.  Regarding the tightness around the foot and I do suspect she likely has some age-related peripheral neuropathy.  Currently not painful enough that she would like to start a medication such as gabapentin.  She will let me know if this changes we will continue to monitor.  Return in about 3 months (around 04/15/2023) for painful thick fungal nails.

## 2023-01-16 DIAGNOSIS — H04123 Dry eye syndrome of bilateral lacrimal glands: Secondary | ICD-10-CM | POA: Diagnosis not present

## 2023-01-16 DIAGNOSIS — E119 Type 2 diabetes mellitus without complications: Secondary | ICD-10-CM | POA: Diagnosis not present

## 2023-01-16 DIAGNOSIS — H53149 Visual discomfort, unspecified: Secondary | ICD-10-CM | POA: Diagnosis not present

## 2023-01-16 DIAGNOSIS — H524 Presbyopia: Secondary | ICD-10-CM | POA: Diagnosis not present

## 2023-01-16 DIAGNOSIS — H35033 Hypertensive retinopathy, bilateral: Secondary | ICD-10-CM | POA: Diagnosis not present

## 2023-01-16 DIAGNOSIS — H43393 Other vitreous opacities, bilateral: Secondary | ICD-10-CM | POA: Diagnosis not present

## 2023-01-16 DIAGNOSIS — H52223 Regular astigmatism, bilateral: Secondary | ICD-10-CM | POA: Diagnosis not present

## 2023-01-16 DIAGNOSIS — H43813 Vitreous degeneration, bilateral: Secondary | ICD-10-CM | POA: Diagnosis not present

## 2023-01-16 DIAGNOSIS — H5203 Hypermetropia, bilateral: Secondary | ICD-10-CM | POA: Diagnosis not present

## 2023-01-17 ENCOUNTER — Other Ambulatory Visit: Payer: Self-pay | Admitting: Cardiology

## 2023-01-17 DIAGNOSIS — L509 Urticaria, unspecified: Secondary | ICD-10-CM

## 2023-01-20 ENCOUNTER — Inpatient Hospital Stay (HOSPITAL_BASED_OUTPATIENT_CLINIC_OR_DEPARTMENT_OTHER): Payer: Medicare PPO | Admitting: Internal Medicine

## 2023-01-20 ENCOUNTER — Other Ambulatory Visit: Payer: Self-pay

## 2023-01-20 ENCOUNTER — Inpatient Hospital Stay: Payer: Medicare PPO | Attending: Internal Medicine

## 2023-01-20 VITALS — BP 152/82 | HR 109 | Temp 97.5°F | Resp 16 | Wt 122.6 lb

## 2023-01-20 DIAGNOSIS — D509 Iron deficiency anemia, unspecified: Secondary | ICD-10-CM | POA: Insufficient documentation

## 2023-01-20 DIAGNOSIS — D539 Nutritional anemia, unspecified: Secondary | ICD-10-CM

## 2023-01-20 DIAGNOSIS — I119 Hypertensive heart disease without heart failure: Secondary | ICD-10-CM | POA: Diagnosis not present

## 2023-01-20 DIAGNOSIS — D696 Thrombocytopenia, unspecified: Secondary | ICD-10-CM | POA: Insufficient documentation

## 2023-01-20 DIAGNOSIS — I739 Peripheral vascular disease, unspecified: Secondary | ICD-10-CM | POA: Diagnosis not present

## 2023-01-20 LAB — CBC WITH DIFFERENTIAL (CANCER CENTER ONLY)
Abs Immature Granulocytes: 0 10*3/uL (ref 0.00–0.07)
Basophils Absolute: 0 10*3/uL (ref 0.0–0.1)
Basophils Relative: 1 %
Eosinophils Absolute: 0 10*3/uL (ref 0.0–0.5)
Eosinophils Relative: 1 %
HCT: 38 % (ref 36.0–46.0)
Hemoglobin: 11.7 g/dL — ABNORMAL LOW (ref 12.0–15.0)
Immature Granulocytes: 0 %
Lymphocytes Relative: 44 %
Lymphs Abs: 1.9 10*3/uL (ref 0.7–4.0)
MCH: 22.8 pg — ABNORMAL LOW (ref 26.0–34.0)
MCHC: 30.8 g/dL (ref 30.0–36.0)
MCV: 73.9 fL — ABNORMAL LOW (ref 80.0–100.0)
Monocytes Absolute: 0.3 10*3/uL (ref 0.1–1.0)
Monocytes Relative: 7 %
Neutro Abs: 2.1 10*3/uL (ref 1.7–7.7)
Neutrophils Relative %: 47 %
Platelet Count: 140 10*3/uL — ABNORMAL LOW (ref 150–400)
RBC: 5.14 MIL/uL — ABNORMAL HIGH (ref 3.87–5.11)
RDW: 14.9 % (ref 11.5–15.5)
WBC Count: 4.3 10*3/uL (ref 4.0–10.5)
nRBC: 0 % (ref 0.0–0.2)

## 2023-01-20 LAB — IRON AND IRON BINDING CAPACITY (CC-WL,HP ONLY)
Iron: 89 ug/dL (ref 28–170)
Saturation Ratios: 28 % (ref 10.4–31.8)
TIBC: 319 ug/dL (ref 250–450)
UIBC: 230 ug/dL (ref 148–442)

## 2023-01-20 LAB — VITAMIN B12: Vitamin B-12: 660 pg/mL (ref 180–914)

## 2023-01-20 LAB — FERRITIN: Ferritin: 86 ng/mL (ref 11–307)

## 2023-01-20 NOTE — Progress Notes (Signed)
[place] [phone]   [phone]  OFFICE PROGRESS NOTE  Harlan Stains, MD [place] [address]  DIAGNOSIS:  1) Thrombocytopenia likely ITP. 2) microcytic anemia of unclear etiology.   PRIOR THERAPY: None  CURRENT THERAPY: Multivitamins with vitamin B12 supplements.  INTERVAL HISTORY: Donna Baldwin 82 y.o. female returns to the clinic today for follow-up visit accompanied by her husband.  The patient is feeling fine today with no concerning complaints except for the cold finger and discoloration.  She has been treated for peripheral vascular disease.  She denied having any chest pain, shortness of breath, cough or hemoptysis.  She has no nausea, vomiting, diarrhea or constipation.  She has no headache or visual changes.  She has no recent weight loss or night sweats.  She is here today for evaluation and repeat blood work.   MEDICAL HISTORY: Past Medical History:  Diagnosis Date   Acid reflux    Anemia    Anxiety    Back pain    GERD (gastroesophageal reflux disease)    Hyperlipidemia    Hypertensive heart disease without heart failure    Thrombocytopenia (HCC)    Vitamin D deficiency     ALLERGIES:  is allergic to beef allergy; beef-derived products; codeine; hydralazine; iodine; iodine-131; ivp dye [iodinated contrast media]; latex; morphine and related; penicillins; sertraline; shellfish allergy; solu-medrol [methylprednisolone]; sulfa antibiotics; sulfasalazine; barbiturates; bee pollen; bee venom; diovan [valsartan]; gabapentin; livalo [pitavastatin]; nutritional supplements; other; oxycodone; promethazine-dm; tape; wellness essentials blood sugr [nutritional supplements]; albuterol; ciprofloxacin; crestor [rosuvastatin]; diltiazem hcl; ezetimibe; famotidine; oxycodone hcl; pred forte [prednisolone acetate]; welchol [colesevelam]; celebrex [celecoxib]; lanolin; mobic [meloxicam]; nickel; nitrates,  organic; petrolatum; soy allergy; and strawberry extract.  MEDICATIONS:  Current Outpatient Medications  Medication Sig Dispense Refill   acetaminophen (TYLENOL) 500 MG tablet 2 tablets     Calcium Carbonate Antacid (TUMS CHEWY BITES PO) Take 1 tablet by mouth daily as needed (acid reflux).     cetirizine (ZYRTEC) 10 MG tablet Take 10 mg by mouth daily as needed for allergies.     ciclopirox (PENLAC) 8 % solution Apply topically at bedtime. Apply over nail and surrounding skin. Apply daily over previous coat. After seven (7) days, may remove with alcohol and continue cycle. 6.6 mL 0   EPINEPHrine (EPIPEN 2-PAK) 0.3 mg/0.3 mL IJ SOAJ injection Inject 0.3 mg into the muscle as needed for anaphylaxis. 2 each 1   EPINEPHrine 0.3 mg/0.3 mL IJ SOAJ injection Inject 0.3 mg into the muscle once as needed (for anaphylaxis).  1   famotidine (PEPCID) 20 MG tablet Take 1 tablet (20 mg total) by mouth 2 (two) times daily. 180 tablet 1   hydrALAZINE (APRESOLINE) 25 MG tablet Take 1 tablet (25 mg total) by mouth every 4 (four) hours as needed (SBP > 150 mm Hg.). 60 tablet 2   hydrOXYzine (ATARAX) 10 MG tablet TAKE 1 TABLET BY MOUTH THREE TIMES A DAY AS NEEDED 270 tablet 1   LORazepam (ATIVAN) 0.5 MG tablet Take 0.5 mg by mouth daily as needed for anxiety.     mometasone (ELOCON) 0.1 % cream Apply 1 application topically daily.     NIFEdipine (ADALAT CC) 60 MG 24 hr tablet Take 1 tablet (60 mg total) by mouth daily. 90 tablet 2   Pyridoxine HCl (VITAMIN B6) 50 MG TABS  (Patient not taking: Reported on 12/26/2022)     tizanidine (ZANAFLEX) 2 MG capsule Take by  mouth.     triamcinolone cream (KENALOG) 0.1 % Apply 1 Application topically 2 (two) times daily.     Vitamin D, Ergocalciferol, (DRISDOL) 1.25 MG (50000 UNIT) CAPS capsule Take 50,000 Units by mouth once a week.     No current facility-administered medications for this visit.    SURGICAL HISTORY:  Past Surgical History:  Procedure Laterality Date    ESOPHAGEAL MANOMETRY N/A 03/15/2020   Procedure: ESOPHAGEAL MANOMETRY (EM);  Surgeon: Wilford Corner, MD;  Location: [facility];  Service: Endoscopy;  Laterality: N/A;   EYE SURGERY     HELLER MYOTOMY N/A 04/18/2020   Procedure: LAPAROSCOPIC HELLER MYOTOMY WITH UPPER ENDOSCOPY;  Surgeon: Kinsinger, Arta Bruce, MD;  Location: [facility];  Service: General;  Laterality: N/A;   [place] IMPEDANCE STUDY N/A 03/15/2020   Procedure: [place] IMPEDANCE STUDY;  Surgeon: Wilford Corner, MD;  Location: [facility];  Service: Endoscopy;  Laterality: N/A;   SHOULDER SURGERY Bilateral    TONSILLECTOMY      REVIEW OF SYSTEMS:  A comprehensive review of systems was negative except for: Constitutional: positive for fatigue   PHYSICAL EXAMINATION: General appearance: alert, cooperative, fatigued, and no distress Head: Normocephalic, without obvious abnormality, atraumatic Neck: no adenopathy, no JVD, supple, symmetrical, trachea midline, and thyroid not enlarged, symmetric, no tenderness/mass/nodules Lymph nodes: Cervical, supraclavicular, and axillary nodes normal. Resp: clear to auscultation bilaterally Back: symmetric, no curvature. ROM normal. No CVA tenderness. Cardio: regular rate and rhythm, S1, S2 normal, no murmur, click, rub or gallop GI: soft, non-tender; bowel sounds normal; no masses,  no organomegaly Extremities: extremities normal, atraumatic, no cyanosis or edema  ECOG PERFORMANCE STATUS: 1 - Symptomatic but completely ambulatory  Blood pressure (!) 152/82, pulse (!) 109, temperature (!) 97.5 F (36.4 C), temperature source Oral, resp. rate 16, weight 122 lb 9 oz (55.6 kg), SpO2 100 %.  LABORATORY DATA: Lab Results  Component Value Date   WBC 4.3 01/20/2023   HGB 11.7 (L) 01/20/2023   HCT 38.0 01/20/2023   MCV 73.9 (L) 01/20/2023   PLT 140 (L) 01/20/2023      Chemistry      Component Value Date/Time   NA 139 08/29/2022 1014   K 4.2 08/29/2022 1014   CL 102 08/29/2022 1014   CO2 21  08/29/2022 1014   BUN 14 08/29/2022 1014   CREATININE 0.87 08/29/2022 1014   CREATININE 0.82 04/11/2021 1300   CREATININE 0.87 04/03/2017 1059      Component Value Date/Time   CALCIUM 9.7 08/29/2022 1014   ALKPHOS 98 08/29/2022 1014   AST 23 08/29/2022 1014   AST 21 04/11/2021 1300   ALT 9 08/29/2022 1014   ALT 12 04/11/2021 1300   BILITOT 0.4 08/29/2022 1014   BILITOT 0.4 04/11/2021 1300       RADIOGRAPHIC STUDIES: No results found.  ASSESSMENT AND PLAN: This is a very pleasant 82 years old white female with mild thrombocytopenia likely ITP in addition to mild microcytic anemia.  The patient underwent hemoglobin electrophoresis that showed normal pattern.   The patient is currently on multivitamins as well as B12 orally.  She is tolerating her treatment well. Repeat CBC today showed mild anemia with hemoglobin of 11.7 and hematocrit 38.0% with a platelet count of 140,000. Iron study, ferritin and vitamin B12 are still pending. I recommended for the patient to continue on observation with repeat blood work in 6 months unless the pending iron studies showed significant deficiency, I will arrange for her to receive iron infusion.  The patient was advised to call immediately if she has any other concerning symptoms in the interval. The patient voices understanding of current disease status and treatment options and is in agreement with the current care plan.  All questions were answered. The patient knows to call the clinic with any problems, questions or concerns. We can certainly see the patient much sooner if necessary.  Disclaimer: This note was dictated with voice recognition software. Similar sounding words can inadvertently be transcribed and may not be corrected upon review.

## 2023-01-21 ENCOUNTER — Telehealth: Payer: Self-pay

## 2023-01-21 NOTE — Telephone Encounter (Signed)
Per provider -   I'm going to need more info.    Pt has found a study for hives that is a group study?   Is it a clinical trial for a medication?   Is it a support group?    I can't provide any feedback without knowing what she is referencing.  When I asked the patient the above questions, her response:   Patient did advised it would be different medications - possibly a clinical trial. Patient stated she saw the advertisement on Facebook - didn't click on anything though.   Would like to her to a consult appt or televisit?

## 2023-01-21 NOTE — Telephone Encounter (Signed)
Patient called in - DOB verified - wanted Dr. Jeralyn Ruths advice regarding joining a Group Study on Hives.  Patient advised message would be forwarded to provider - once the provider responds patient will be contacted via myChart or telephone.   Patient is available to make an appointment, televisit or receive advice via her myChart.  Patient verbalized understanding, no further questions.

## 2023-01-22 ENCOUNTER — Other Ambulatory Visit: Payer: Self-pay

## 2023-01-22 DIAGNOSIS — I10 Essential (primary) hypertension: Secondary | ICD-10-CM

## 2023-01-22 NOTE — Progress Notes (Signed)
Patient would like follow up monitoring for liver function since she is on nifedipine.  Patient mentioned she still gets hives/rash but the hydroxyzine helps some.

## 2023-01-23 NOTE — Telephone Encounter (Signed)
Per Provider -  Ok thanks.   I am not aware of any clinical trials for hives with new enrollments thus if she would like my input if she can provide [place] the link or site info I can look it through. If it is a clinical trial for a medication they are usually quite specific with who is eligible and should have some screening criteria she can look through to see if she may be eligible.   I will always be a proponent of clinical trials as this is a means of getting evidence based medication and medications approved for clinical use if the person meets criteria.   I don't need for her to make an appt for this unless she would like to discuss it but would need to know what the study is about.  Called patient - DOB verified - advised of provider's notation above.  Patient advised the following information: [employer]                                                                   Therapeutic Areas                                                                   Inflammation  Patient stated she thinks she has aged out though.  Patient advised message would be forwarded to provider as update.  Patient verbalized understanding, no further questions.

## 2023-01-27 DIAGNOSIS — L309 Dermatitis, unspecified: Secondary | ICD-10-CM | POA: Diagnosis not present

## 2023-01-27 DIAGNOSIS — L298 Other pruritus: Secondary | ICD-10-CM | POA: Diagnosis not present

## 2023-01-27 DIAGNOSIS — L308 Other specified dermatitis: Secondary | ICD-10-CM | POA: Diagnosis not present

## 2023-01-27 DIAGNOSIS — L501 Idiopathic urticaria: Secondary | ICD-10-CM | POA: Diagnosis not present

## 2023-01-28 DIAGNOSIS — E1169 Type 2 diabetes mellitus with other specified complication: Secondary | ICD-10-CM | POA: Diagnosis not present

## 2023-01-28 DIAGNOSIS — I10 Essential (primary) hypertension: Secondary | ICD-10-CM | POA: Diagnosis not present

## 2023-01-28 DIAGNOSIS — E440 Moderate protein-calorie malnutrition: Secondary | ICD-10-CM | POA: Diagnosis not present

## 2023-01-31 DIAGNOSIS — K5909 Other constipation: Secondary | ICD-10-CM | POA: Diagnosis not present

## 2023-01-31 DIAGNOSIS — K220 Achalasia of cardia: Secondary | ICD-10-CM | POA: Diagnosis not present

## 2023-01-31 DIAGNOSIS — R1010 Upper abdominal pain, unspecified: Secondary | ICD-10-CM | POA: Diagnosis not present

## 2023-01-31 DIAGNOSIS — I10 Essential (primary) hypertension: Secondary | ICD-10-CM | POA: Diagnosis not present

## 2023-02-01 LAB — COMPREHENSIVE METABOLIC PANEL
ALT: 13 IU/L (ref 0–32)
AST: 23 IU/L (ref 0–40)
Albumin/Globulin Ratio: 1.9 (ref 1.2–2.2)
Albumin: 4.8 g/dL — ABNORMAL HIGH (ref 3.7–4.7)
Alkaline Phosphatase: 122 IU/L — ABNORMAL HIGH (ref 44–121)
BUN/Creatinine Ratio: 16 (ref 12–28)
BUN: 15 mg/dL (ref 8–27)
Bilirubin Total: 0.2 mg/dL (ref 0.0–1.2)
CO2: 21 mmol/L (ref 20–29)
Calcium: 9.9 mg/dL (ref 8.7–10.3)
Chloride: 102 mmol/L (ref 96–106)
Creatinine, Ser: 0.93 mg/dL (ref 0.57–1.00)
Globulin, Total: 2.5 g/dL (ref 1.5–4.5)
Glucose: 102 mg/dL — ABNORMAL HIGH (ref 70–99)
Potassium: 4.6 mmol/L (ref 3.5–5.2)
Sodium: 141 mmol/L (ref 134–144)
Total Protein: 7.3 g/dL (ref 6.0–8.5)
eGFR: 62 mL/min/{1.73_m2} (ref >59)

## 2023-02-13 ENCOUNTER — Ambulatory Visit: Payer: Medicare PPO | Admitting: Allergy

## 2023-02-13 ENCOUNTER — Encounter: Payer: Self-pay | Admitting: Allergy

## 2023-02-13 ENCOUNTER — Other Ambulatory Visit: Payer: Self-pay

## 2023-02-13 VITALS — BP 190/80 | HR 90 | Temp 98.4°F | Resp 20 | Wt 109.1 lb

## 2023-02-13 DIAGNOSIS — L509 Urticaria, unspecified: Secondary | ICD-10-CM | POA: Diagnosis not present

## 2023-02-13 DIAGNOSIS — T783XXD Angioneurotic edema, subsequent encounter: Secondary | ICD-10-CM

## 2023-02-13 NOTE — Patient Instructions (Addendum)
Hives and swelling - at this time etiology of hives and swelling appears to be spontaneous.  Most commonly hives that continue to appear is usually not do to a food allergen.  I believe your hives are coincidental with eating and regardless of what you eat you were going to have hives.   Hives can be caused by a variety of different triggers including illness/infection, foods, medications, stings, exercise, pressure, vibrations, extremes of temperature to name a few however majority of the time there is no identifiable trigger.   - continue Zyrtec 41m 1 tab twice daily at this time.  - discussed option of adding Singulair however due to side effect profile has not tried - discussed Xolair injections previously however due to side effect profile and improvement in frequency of hives will hold off - would use Hydroxyzine sparingly for breakthrough symptoms as this medication is sedating - agree with you discussing Nifedipine with your cardiologist.  May be a hive trigger if you develop hives within 2 hours of taking medication every time you take it - you have access to self-injectable epinephrine Epipen 0.371mat all times - follow emergency action plan in case of allergic reaction - discussed starting to perform in-office food challenges to the foods with low to moderate IgE levels.  Agreed to start with beef challenge to a cooked hamburger patty.   After that would do either apple or a nut (except for hazelnut) - advised she could monitor at home when she eats and if she eats a product and does not have any new symptoms arise within 2 hour window of eating then those foods are not a food allergen and can keep eating those foods.   - I would like to be able to broaden her diet at this time   Follow-up in 3 months or sooner if needed

## 2023-02-13 NOTE — Progress Notes (Signed)
Follow-up Note  RE: Donna Baldwin MRN: [id number] DOB: 10/16/1941 Date of Office Visit: 02/13/2023   History of present illness: Donna Baldwin is a 82 y.o. female presenting today for follow-up of hives.  She presents with her husband.  She was last seen in the office on 12/05/2022 by myself.  She is still having hives however she does feel like it has slowed down.  She provides pictures of the hives she had in February thus far and states she did wake up this morning with a couple hives.     She states she did look this over information and provided her and she feels like this medication may be "Too strong" for her and since she does feel like the hives are less frequent than they used to be that this medication will not be necessary at this point in time.  She also has not started Singulair as she states the side of that profile is quite alarming.  She does have Singulair at home in the cabinet. She states she seems to note that the hives do seem to occur sometimes after she takes the nifedipine.  She has discussed this with her cardiologist who recommended she use hydroxyzine.  Hydroxyzine does make her drowsy.  She states she has been alternating Zyrtec and hydroxyzine.   She states she took herself of nifedipine daily and has been doing it every other day and is waiting to hear back about if she may need to have a dose decrease or change medication. She does feel like the hydroxyzine is helpful because she states that she does take it she feels like she cannot eat better without a lot of concern for having any issue.  There is she still does have a pretty narrowed diet.  She is working with nutritionist.  She states the nutritionist wants her to not eat a lot processed or foods containing a lot of additives. This morning she states she had oatmeal mate with oats and canned milk at about a 8:30am by the time of his appointment it was around 1130 and she has not noted any issues  after eating this meal.  Thus advised that I am not concerned that she has any allergy to the food she ate this morning. She did bring in several food products from her home including several types of cinnamon, different types of rice products, different types of oat products and teas.  She is definitely interested in doing some food challenges so that we can safely see if she can put more foods back into her diet.  Review of systems: Review of Systems  Constitutional: Negative.   HENT: Negative.    Eyes: Negative.   Respiratory: Negative.    Cardiovascular: Negative.   Gastrointestinal: Negative.   Musculoskeletal: Negative.   Skin:  Positive for rash.  Allergic/Immunologic: Negative.   Neurological: Negative.      All other systems negative unless noted above in HPI  Past medical/social/surgical/family history have been reviewed and are unchanged unless specifically indicated below.  No changes  Medication List: Current Outpatient Medications  Medication Sig Dispense Refill   acetaminophen (TYLENOL) 500 MG tablet 2 tablets     Calcium Carbonate Antacid (TUMS CHEWY BITES PO) Take 1 tablet by mouth daily as needed (acid reflux).     cetirizine (ZYRTEC) 10 MG tablet Take 10 mg by mouth daily as needed for allergies.     ciclopirox (PENLAC) 8 % solution Apply topically at bedtime. Apply  over nail and surrounding skin. Apply daily over previous coat. After seven (7) days, may remove with alcohol and continue cycle. 6.6 mL 0   EPINEPHrine (EPIPEN 2-PAK) 0.3 mg/0.3 mL IJ SOAJ injection Inject 0.3 mg into the muscle as needed for anaphylaxis. 2 each 1   EPINEPHrine 0.3 mg/0.3 mL IJ SOAJ injection Inject 0.3 mg into the muscle once as needed (for anaphylaxis).  1   famotidine (PEPCID) 20 MG tablet Take 1 tablet (20 mg total) by mouth 2 (two) times daily. 180 tablet 1   hydrALAZINE (APRESOLINE) 25 MG tablet Take 1 tablet (25 mg total) by mouth every 4 (four) hours as needed (SBP > 150 mm  Hg.). 60 tablet 2   hydrOXYzine (ATARAX) 10 MG tablet TAKE 1 TABLET BY MOUTH THREE TIMES A DAY AS NEEDED 270 tablet 1   LORazepam (ATIVAN) 0.5 MG tablet Take 0.5 mg by mouth daily as needed for anxiety.     mometasone (ELOCON) 0.1 % cream Apply 1 application topically daily.     montelukast (SINGULAIR) 10 MG tablet Take 10 mg by mouth at bedtime.     NIFEdipine (ADALAT CC) 60 MG 24 hr tablet Take 1 tablet (60 mg total) by mouth daily. 90 tablet 2   Pyridoxine HCl (VITAMIN B6) 50 MG TABS      tizanidine (ZANAFLEX) 2 MG capsule Take by mouth.     triamcinolone cream (KENALOG) 0.1 % Apply 1 Application topically 2 (two) times daily.     Vitamin D, Ergocalciferol, (DRISDOL) 1.25 MG (50000 UNIT) CAPS capsule Take 50,000 Units by mouth once a week.     No current facility-administered medications for this visit.     Known medication allergies: Allergies  Allergen Reactions   Beef Allergy Anaphylaxis   Beef-Derived Products Anaphylaxis   Codeine Hives and Anxiety   Hydralazine Hives and Shortness Of Breath   Iodine Anaphylaxis   Iodine-131 Anaphylaxis   Ivp Dye [Iodinated Contrast Media] Anaphylaxis and Other (See Comments)   Latex Rash   Morphine And Related Other (See Comments)    Tremors, increased heart rate, excitement, confusion per pt   Penicillins Hives    Has patient had a PCN reaction causing immediate rash, facial/tongue/throat swelling, SOB or lightheadedness with hypotension: Yes Has patient had a PCN reaction causing severe rash involving mucus membranes or skin necrosis: Yes Has patient had a PCN reaction that required hospitalization: Yes Has patient had a PCN reaction occurring within the last 10 years: No If all of the above answers are "NO", then may proceed with Cephalosporin use.    Sertraline Other (See Comments)    Numbness in mouth and hyperactivity   Shellfish Allergy Hives   Solu-Medrol [Methylprednisolone] Other (See Comments)    Numbness and tingling in  mouth   Sulfa Antibiotics Hives and Rash   Sulfasalazine Hives and Rash   Barbiturates Other (See Comments)    Excitement    Bee Pollen Hives   Bee Venom Hives   Diovan [Valsartan] Nausea Only   Gabapentin Other (See Comments)    Visual disturbance   Livalo [Pitavastatin] Swelling and Other (See Comments)    Facial swelling   Nutritional Supplements     Other reaction(s): Other (See Comments) Nuts, strawberries, bicarbonate of soda, Nuts, strawberries, bicarbonate of soda,   Other Palpitations, Other (See Comments) and Rash    NARCOTIC ANALGESICS-TREMORS, INCREASED HEART RATE Hmg-Coa Reductase Inhibitors - intolerance unknown    Oxycodone Swelling, Rash and Cough   Promethazine-Dm Other (  See Comments)    "Felt funny"   Tape Rash and Other (See Comments)    Red blotches    Wellness Essentials Blood Sugr [Nutritional Supplements] Other (See Comments)    Nuts, strawberries, bicarbonate of soda,   Albuterol Other (See Comments)    Caused wheezing   Ciprofloxacin Other (See Comments)   Crestor [Rosuvastatin] Other (See Comments)   Diltiazem Hcl     Other reaction(s): foot sores   Ezetimibe     Other reaction(s): myalgias   Famotidine Other (See Comments)    Unknown reaction   Oxycodone Hcl     Other reaction(s): hives, rash   Pred Forte [Prednisolone Acetate] Other (See Comments)    Patient is seeing odd color and images while using this   Welchol [Colesevelam]     Other reaction(s): myalgias   Celebrex [Celecoxib] Swelling and Other (See Comments)    Swelling and numbness (mouth)   Lanolin Itching   Mobic [Meloxicam] Swelling and Other (See Comments)    Swelling and numbness in mouth   Nickel Rash   Nitrates, Organic Rash and Other (See Comments)    Chest tightness also   Petrolatum Rash and Other (See Comments)   Soy Allergy Rash   Strawberry Extract Rash     Physical examination: Blood pressure (!) 190/80, pulse 90, temperature 98.4 F (36.9 C), temperature  source Temporal, resp. rate 20, weight 109 lb 1.6 oz (49.5 kg), SpO2 98 %.  General: Alert, interactive, in no acute distress. Lungs: Clear to auscultation without wheezing, rhonchi or rales. {no increased work of breathing. CV: Normal S1, S2 without murmurs. Abdomen: Nondistended, nontender. Skin: Warm and dry, without lesions or rashes. Extremities:  No clubbing, cyanosis or edema. Neuro:   Grossly intact.  Diagnositics/Labs: Component     Latest Ref Rng 11/13/2022  WBC     3.4 - 10.8 x10E3/uL 3.3 (L)   RBC     3.77 - 5.28 x10E6/uL 5.26   Hemoglobin     11.1 - 15.9 g/dL 12.1   HCT     34.0 - 46.6 % 39.8   MCV     79 - 97 fL 76 (L)   MCH     26.6 - 33.0 pg 23.0 (L)   MCHC     31.5 - 35.7 g/dL 30.4 (L)   RDW     11.7 - 15.4 % 14.7   Neutrophils     Not Estab. % 57   Lymphs     Not Estab. % 31   Monocytes     Not Estab. % 10   Eos     Not Estab. % 1   Basos     Not Estab. % 1   NEUT#     1.4 - 7.0 x10E3/uL 1.9   Lymphocyte #     0.7 - 3.1 x10E3/uL 1.0   Monocytes Absolute     0.1 - 0.9 x10E3/uL 0.3   EOS (ABSOLUTE)     0.0 - 0.4 x10E3/uL 0.0   Basophils Absolute     0.0 - 0.2 x10E3/uL 0.0   Immature Granulocytes     Not Estab. % 0   Immature Grans (Abs)     0.0 - 0.1 x10E3/uL 0.0   Class Description Allergens Comment   F017-IgE Hazelnut ([place])     Class IV kU/L 10.80 !   F256-IgE Walnut     Class III kU/L 1.89 !   F202-IgE Cashew Nut     Class I  kU/L 0.38 !   F018-IgE [place] Nut     Class 0/I kU/L 0.11 !   Peanut, IgE     Class III kU/L 2.78 !   Macadamia Nut, IgE     Class III kU/L 1.66 !   Pecan Nut IgE     Class 0/I kU/L 0.14 !   F203-IgE Pistachio Nut     Class II kU/L 1.33 !   F020-IgE Almond     Class III kU/L 1.79 !   Clam IgE     Class 0/I kU/L 0.18 !   F023-IgE Crab     Class 0/I kU/L 0.21 !   Shrimp IgE     Class I kU/L 0.33 !   Scallop IgE     Class 0/I kU/L 0.19 !   F290-IgE Oyster     Class 0/I kU/L 0.27 !   F080-IgE  Lobster     Class 0/I kU/L 0.13 !   F422-IgE Ara h 1     Class 0 kU/L <0.10   F423-IgE Ara h 2     Class 0 kU/L <0.10   F424-IgE Ara h 3     Class 0 kU/L <0.10   F447-IgE Ara h 6     Class 0 kU/L <0.10   F352-IgE Ara h 8     Class III kU/L 2.77 !   F427-IgE Ara h 9     Class 0 kU/L <0.10   IgE (Immunoglobulin E), Serum     6 - 495 IU/mL 1,668 (H)   O215-IgE Alpha-Gal     Class 0 kU/L <0.10   Beef IgE     Class 0 kU/L <0.10   Pork IgE     Class 0 kU/L <0.10   Allergen Lamb IgE     Class 0/I kU/L 0.11 !   Cor A 1 IgE     Class IV kU/L 13.90 !   Cor A 8 IgE     Class 0 kU/L <0.10   Cor A 9 IgE     Class 0/I kU/L 0.13 !   Cor A 14 IgE     Class 0 kU/L <0.10   Jug R 1 IgE     Class 0 kU/L <0.10   Jug R 3 IgE     Class I kU/L 0.45 !   Tryptase     2.2 - 13.2 ug/L 3.4   Allergen Apple, IgE     Class III kU/L 1.67 !   Allergen Cinnamon IgE     Class 0 kU/L <0.10   Soybean IgE     Class II kU/L 0.81 !   cu index     <10  <3.8   Allergen Comments Note   ANA O 3 IgE     Class I kU/L 0.50 !   Ber E 1 IgE     Class 0 kU/L <0.10     Assessment and plan:   Hives and swelling - at this time etiology of hives and swelling appears to be spontaneous.  Most commonly hives that continue to appear is usually not do to a food allergen.  I believe your hives are coincidental with eating and regardless of what you eat you were going to have hives.   Hives can be caused by a variety of different triggers including illness/infection, foods, medications, stings, exercise, pressure, vibrations, extremes of temperature to name a few however majority of the time there is no identifiable trigger.   -  continue Zyrtec 28m 1 tab twice daily at this time.  - discussed option of adding Singulair however due to side effect profile has not tried - discussed Xolair injections previously however due to side effect profile and improvement in frequency of hives will hold off - would use Hydroxyzine  sparingly for breakthrough symptoms as this medication is sedating - agree with you discussing Nifedipine with your cardiologist.  May be a hive trigger if you develop hives within 2 hours of taking medication every time you take it - you have access to self-injectable epinephrine Epipen 0.345mat all times - follow emergency action plan in case of allergic reaction - discussed starting to perform in-office food challenges to the foods with low to moderate IgE levels.  Agreed to start with beef challenge to a cooked hamburger patty.   After that would do either apple or a nut (except for hazelnut).  She is aware will need to hold all antihistamines for 3 days prior to challenge visits - advised she could monitor at home when she eats and if she eats a product and does not have any new symptoms arise within 2 hour window of eating then those foods are not a food allergen and can keep eating those foods.   - I would like to be able to broaden her diet at this time to ensure she is getting adequate calories and essential vitamin intake in her foods   Follow-up in 3 months or sooner if needed  I appreciate the opportunity to take part in Oriyah's care. Please do not hesitate to contact me with questions.  Sincerely,   ShPrudy FeelerMD Allergy/[facility]ology [facility]

## 2023-02-14 DIAGNOSIS — E785 Hyperlipidemia, unspecified: Secondary | ICD-10-CM | POA: Diagnosis not present

## 2023-02-14 DIAGNOSIS — I10 Essential (primary) hypertension: Secondary | ICD-10-CM | POA: Diagnosis not present

## 2023-02-14 DIAGNOSIS — E1169 Type 2 diabetes mellitus with other specified complication: Secondary | ICD-10-CM | POA: Diagnosis not present

## 2023-02-27 ENCOUNTER — Encounter: Payer: Medicare PPO | Admitting: Family Medicine

## 2023-02-27 NOTE — Progress Notes (Signed)
[name] [address] Dept: [phone]  FOLLOW UP NOTE  Patient ID: Donna Baldwin, female    DOB: [birth year]  Age: 82 y.o. MRN: [id number] Date of Office Visit: 02/28/2023  Assessment  Chief Complaint: Food/Drug Challenge  HPI Donna Baldwin is an 23 old female who presents to the clinic for follow-up visit with an in office oral food challenge to beef.  She was last seen in this clinic on 02/13/2023 by Dr. Nelva Bush for evaluation of food allergy and hives.  Lab testing from 11/13/2022 indicates negative beef IgE. At today's visit, she reports that she has stopped taking her nifedipine for the last 3 days. She reports that when she takes nifedipine she develops itch and hives and subsequently needs to take hydroxyzine to control hives. She reports that she believes nifedipine is the cause of these hives. She reports that she has taken several different medications to control her blood pressure and began taking nifedipine in August of 2023. She developed her first bout of hives in November 2023. At today's visit, her blood pressure is 200/100. She has been notified that we can not safely proceed with food challenge at today's visit and that she needs to contact her cardiologist or her primary care provider for further management of her blood pressure. We did discuss Xolair injections to control hives, however, she states that she is not interested in taking a medication to control another medication's side effects. She reports that she will possibly return to the clinic for food challenges. She understands to call the clinic if her symptoms worsen. Her current medications are listed in the chart.   Drug Allergies:  Allergies  Allergen Reactions   Beef Allergy Anaphylaxis   Beef-Derived Products Anaphylaxis   Codeine Hives and Anxiety   Hydralazine Hives and Shortness Of Breath   Iodine Anaphylaxis   Iodine-131 Anaphylaxis   Ivp Dye [Iodinated Contrast Media]  Anaphylaxis and Other (See Comments)   Latex Rash   Morphine And Related Other (See Comments)    Tremors, increased heart rate, excitement, confusion per pt   Penicillins Hives    Has patient had a PCN reaction causing immediate rash, facial/tongue/throat swelling, SOB or lightheadedness with hypotension: Yes Has patient had a PCN reaction causing severe rash involving mucus membranes or skin necrosis: Yes Has patient had a PCN reaction that required hospitalization: Yes Has patient had a PCN reaction occurring within the last 10 years: No If all of the above answers are "NO", then may proceed with Cephalosporin use.    Sertraline Other (See Comments)    Numbness in mouth and hyperactivity   Shellfish Allergy Hives   Solu-Medrol [Methylprednisolone] Other (See Comments)    Numbness and tingling in mouth   Sulfa Antibiotics Hives and Rash   Sulfasalazine Hives and Rash   Barbiturates Other (See Comments)    Excitement    Bee Pollen Hives   Bee Venom Hives   Diovan [Valsartan] Nausea Only   Gabapentin Other (See Comments)    Visual disturbance   Livalo [Pitavastatin] Swelling and Other (See Comments)    Facial swelling   Nutritional Supplements     Other reaction(s): Other (See Comments) Nuts, strawberries, bicarbonate of soda, Nuts, strawberries, bicarbonate of soda,   Other Palpitations, Other (See Comments) and Rash    NARCOTIC ANALGESICS-TREMORS, INCREASED HEART RATE Hmg-Coa Reductase Inhibitors - intolerance unknown    Oxycodone Swelling, Rash and Cough   Promethazine-Dm Other (See Comments)    "  Felt funny"   Tape Rash and Other (See Comments)    Red blotches    Wellness Essentials Blood Sugr [Nutritional Supplements] Other (See Comments)    Nuts, strawberries, bicarbonate of soda,   Albuterol Other (See Comments)    Caused wheezing   Ciprofloxacin Other (See Comments)   Crestor [Rosuvastatin] Other (See Comments)   Diltiazem Hcl     Other reaction(s): foot sores    Ezetimibe     Other reaction(s): myalgias   Famotidine Other (See Comments)    Unknown reaction   Oxycodone Hcl     Other reaction(s): hives, rash   Pred Forte [Prednisolone Acetate] Other (See Comments)    Patient is seeing odd color and images while using this   Welchol [Colesevelam]     Other reaction(s): myalgias   Celebrex [Celecoxib] Swelling and Other (See Comments)    Swelling and numbness (mouth)   Lanolin Itching   Mobic [Meloxicam] Swelling and Other (See Comments)    Swelling and numbness in mouth   Nickel Rash   Nitrates, Organic Rash and Other (See Comments)    Chest tightness also   Petrolatum Rash and Other (See Comments)   Soy Allergy Rash   Strawberry Extract Rash    Physical Exam: BP (!) 200/100   Pulse 86   Temp 98.6 F (37 C)   Ht '5\' 5"'$  (1.651 m)   Wt 104 lb 14.4 oz (47.6 kg)   SpO2 99%   BMI 17.46 kg/m     Assessment and Plan: 1. Elevated blood pressure reading     Patient Instructions  Food challenge/elevated blood pressure We were not able to move forward with the food challenge today due to severly elevated blood pressure Follow up with your cardiologist or your primary care provider for further evaluation of your blood pressure as soon as possible  Call the clinic if this treatment plan is not working well for you  Follow up in 1 month or sooner if needed.     Return in about 4 weeks (around 03/28/2023), or if symptoms worsen or fail to improve.    Thank you for the opportunity to care for this patient.  Please do not hesitate to contact me with questions.  Gareth Morgan, FNP [facility]

## 2023-02-27 NOTE — Patient Instructions (Signed)
Food challenge/elevated blood pressure We were not able to move forward with the food challenge today due to severly elevated blood pressure Follow up with your cardiologist or your primary care provider for further evaluation of your blood pressure as soon as possible  Call the clinic if this treatment plan is not working well for you  Follow up in 1 month or sooner if needed.

## 2023-02-28 ENCOUNTER — Encounter: Payer: Self-pay | Admitting: Family Medicine

## 2023-02-28 ENCOUNTER — Other Ambulatory Visit: Payer: Self-pay

## 2023-02-28 ENCOUNTER — Ambulatory Visit: Payer: Medicare PPO | Admitting: Family Medicine

## 2023-02-28 VITALS — BP 200/100 | HR 86 | Temp 98.6°F | Ht 65.0 in | Wt 104.9 lb

## 2023-02-28 DIAGNOSIS — R030 Elevated blood-pressure reading, without diagnosis of hypertension: Secondary | ICD-10-CM

## 2023-03-03 ENCOUNTER — Other Ambulatory Visit: Payer: Self-pay | Admitting: Allergy

## 2023-03-06 ENCOUNTER — Encounter: Payer: Self-pay | Admitting: Cardiology

## 2023-03-06 ENCOUNTER — Ambulatory Visit: Payer: Medicare PPO | Admitting: Cardiology

## 2023-03-06 VITALS — BP 128/73 | HR 86 | Resp 16 | Ht 65.0 in | Wt 104.4 lb

## 2023-03-06 DIAGNOSIS — I6522 Occlusion and stenosis of left carotid artery: Secondary | ICD-10-CM | POA: Diagnosis not present

## 2023-03-06 DIAGNOSIS — E559 Vitamin D deficiency, unspecified: Secondary | ICD-10-CM | POA: Diagnosis not present

## 2023-03-06 DIAGNOSIS — I10 Essential (primary) hypertension: Secondary | ICD-10-CM | POA: Diagnosis not present

## 2023-03-06 DIAGNOSIS — E782 Mixed hyperlipidemia: Secondary | ICD-10-CM | POA: Diagnosis not present

## 2023-03-06 MED ORDER — CHOLECALCIFEROL 10 MCG (400 UNIT) PO TABS: 400.0000 [IU] | ORAL_TABLET | Freq: Every day | ORAL | 1 refills | Status: DC

## 2023-03-06 MED ORDER — NIFEDIPINE ER 30 MG PO TB24: 30.0000 mg | ORAL_TABLET | Freq: Every day | ORAL | 2 refills | Status: DC

## 2023-03-06 NOTE — Progress Notes (Signed)
Primary Physician/Referring:  Harlan Stains, MD  Patient ID: Donna Baldwin, female    DOB: 03/07/1941, 82 y.o.   MRN: [id number]  Chief Complaint  Patient presents with   Hypertension   Follow-up    3 months   HPI:    Donna Baldwin  is a 82 y.o. AAF with no significant coronary disease but has mildly elevated coronary calcium score with noncritical distal left main minimal disease by coronary CTA in 2020, coronary calcium score in the 50th percentile, difficult to control hypertension, asymptomatic bilateral carotid stenosis being followed by vascular surgery, multiple medication allergies and intolerance of statins, achalasia cardia SP surgery in 2019, severe vitamin D deficiency, chronic thrombocytopenia felt to be ITP asymptomatic.   This is a 82-monthoffice visit.  She has developed generalized urticaria, she could not have allergy testing done as her blood pressure was extremely high.  She is wondering whether she can reduce the dose of Procardia.  She brings home blood pressure recordings.   Past Medical History:  Diagnosis Date   Acid reflux    Anemia    Anxiety    Back pain    GERD (gastroesophageal reflux disease)    Hyperlipidemia    Hypertensive heart disease without heart failure    Thrombocytopenia (HCC)    Vitamin D deficiency    Past Surgical History:  Procedure Laterality Date   ESOPHAGEAL MANOMETRY N/A 03/15/2020   Procedure: ESOPHAGEAL MANOMETRY (EM);  Surgeon: SWilford Corner MD;  Location: [facility];  Service: Endoscopy;  Laterality: N/A;   EYE SURGERY     HELLER MYOTOMY N/A 04/18/2020   Procedure: LAPAROSCOPIC HELLER MYOTOMY WITH UPPER ENDOSCOPY;  Surgeon: Kinsinger, LArta Bruce MD;  Location: [facility];  Service: General;  Laterality: N/A;   [place] 03/15[place]2020   Procedure: [place];  Surgeon: SWilford Corner MD;  Location: [facility];  Service: Endoscopy;  Laterality: N/A;   SHOULDER SURGERY Bilateral     TONSILLECTOMY     Family History  Problem Relation Age of Onset   Cancer Father    Allergic rhinitis Neg Hx    Angioedema Neg Hx    Asthma Neg Hx    Atopy Neg Hx    Eczema Neg Hx    Immunodeficiency Neg Hx    Urticaria Neg Hx     Social History   Tobacco Use   Smoking status: Never   Smokeless tobacco: Never  Substance Use Topics   Alcohol use: No    Alcohol/week: 0.0 standard drinks of alcohol   Marital Status: Married  ROS  Review of Systems  Cardiovascular:  Negative for chest pain, dyspnea on exertion and leg swelling.   Objective      03/06/2023    9:40 AM 03/06/2023    9:20 AM 02/28/2023    8:45 AM  Vitals with BMI  Height  '5\' 5"'$  '5\' 5"'$   Weight  104 lbs 6 oz 104 lbs 14 oz  BMI  [id number] Systolic [id number] Diastolic 73 81 [id number] Pulse 86 103 86   Today's Vitals   03/06/23 0920 03/06/23 0940  BP: (!) 153/81 128/73  Pulse: (!) 103 86  Resp: 16   SpO2: 98%   Weight: 104 lb 6.4 oz (47.4 kg)   Height: '5\' 5"'$  (1.651 m)      Body mass index is 17.37 kg/m.  Physical Exam Neck:     Vascular: No carotid bruit or JVD.  Cardiovascular:  Rate and Rhythm: Normal rate and regular rhythm.     Pulses: Intact distal pulses.          Carotid pulses are  on the right side with bruit and  on the left side with bruit.      Dorsalis pedis pulses are 0 on the right side and 0 on the left side.       Posterior tibial pulses are 1+ on the right side and 1+ on the left side.     Heart sounds: Normal heart sounds. No murmur heard.    No gallop.  Pulmonary:     Effort: Pulmonary effort is normal.     Breath sounds: Normal breath sounds.  Abdominal:     General: Bowel sounds are normal.     Palpations: Abdomen is soft.  Musculoskeletal:     Right lower leg: No edema.     Left lower leg: No edema.    Medications and allergies   Allergies  Allergen Reactions   Beef Allergy Anaphylaxis   Beef-Derived Products Anaphylaxis   Codeine Hives and Anxiety   Iodine  Anaphylaxis   Iodine-131 Anaphylaxis   Ivp Dye [Iodinated Contrast Media] Anaphylaxis and Other (See Comments)   Latex Rash   Morphine And Related Other (See Comments)    Tremors, increased heart rate, excitement, confusion per pt   Penicillins Hives    Has patient had a PCN reaction causing immediate rash, facial/tongue/throat swelling, SOB or lightheadedness with hypotension: Yes Has patient had a PCN reaction causing severe rash involving mucus membranes or skin necrosis: Yes Has patient had a PCN reaction that required hospitalization: Yes Has patient had a PCN reaction occurring within the last 10 years: No If all of the above answers are "NO", then may proceed with Cephalosporin use.    Sertraline Other (See Comments)    Numbness in mouth and hyperactivity   Shellfish Allergy Hives   Solu-Medrol [Methylprednisolone] Other (See Comments)    Numbness and tingling in mouth   Sulfa Antibiotics Hives and Rash   Sulfasalazine Hives and Rash   Barbiturates Other (See Comments)    Excitement    Bee Pollen Hives   Bee Venom Hives   Diovan [Valsartan] Nausea Only   Gabapentin Other (See Comments)    Visual disturbance   Livalo [Pitavastatin] Swelling and Other (See Comments)    Facial swelling   Nutritional Supplements     Other reaction(s): Other (See Comments) Nuts, strawberries, bicarbonate of soda, Nuts, strawberries, bicarbonate of soda,   Other Palpitations, Other (See Comments) and Rash    NARCOTIC ANALGESICS-TREMORS, INCREASED HEART RATE Hmg-Coa Reductase Inhibitors - intolerance unknown    Oxycodone Swelling, Rash and Cough   Promethazine-Dm Other (See Comments)    "Felt funny"   Tape Rash and Other (See Comments)    Red blotches    Wellness Essentials Blood Sugr [Nutritional Supplements] Other (See Comments)    Nuts, strawberries, bicarbonate of soda,   Albuterol Other (See Comments)    Caused wheezing   Ciprofloxacin Other (See Comments)   Crestor  [Rosuvastatin] Other (See Comments)   Diltiazem Hcl     Other reaction(s): foot sores   Ezetimibe     Other reaction(s): myalgias   Famotidine Other (See Comments)    Unknown reaction   Oxycodone Hcl     Other reaction(s): hives, rash   Pred Forte [Prednisolone Acetate] Other (See Comments)    Patient is seeing odd color and images while using this  Welchol [Colesevelam]     Other reaction(s): myalgias   Celebrex [Celecoxib] Swelling and Other (See Comments)    Swelling and numbness (mouth)   Lanolin Itching   Mobic [Meloxicam] Swelling and Other (See Comments)    Swelling and numbness in mouth   Nickel Rash   Nitrates, Organic Rash and Other (See Comments)    Chest tightness also   Petrolatum Rash and Other (See Comments)   Soy Allergy Rash   Strawberry Extract Rash     Medication list after today's encounter   Current Outpatient Medications:    cetirizine (ZYRTEC) 10 MG tablet, Take 10 mg by mouth daily as needed for allergies., Disp: , Rfl:    cholecalciferol (VITAMIN D3) 10 MCG (400 UNIT) TABS tablet, Take 1 tablet (400 Units total) by mouth daily., Disp: 90 tablet, Rfl: 1   ciclopirox (PENLAC) 8 % solution, Apply topically at bedtime. Apply over nail and surrounding skin. Apply daily over previous coat. After seven (7) days, may remove with alcohol and continue cycle., Disp: 6.6 mL, Rfl: 0   EPINEPHrine (EPIPEN 2-PAK) 0.3 mg/0.3 mL IJ SOAJ injection, Inject 0.3 mg into the muscle as needed for anaphylaxis., Disp: 2 each, Rfl: 1   hydrALAZINE (APRESOLINE) 25 MG tablet, Take 1 tablet (25 mg total) by mouth every 4 (four) hours as needed (SBP > 150 mm Hg.). (Patient taking differently: Take 25 mg by mouth every 4 (four) hours as needed (SBP > 140 mm Hg.).), Disp: 60 tablet, Rfl: 2   hydrOXYzine (ATARAX) 10 MG tablet, TAKE 1 TABLET BY MOUTH THREE TIMES A DAY AS NEEDED (Patient taking differently: Take 10 mg by mouth daily.), Disp: 270 tablet, Rfl: 1   LORazepam (ATIVAN) 0.5 MG  tablet, Take 0.5 mg by mouth daily as needed for anxiety., Disp: , Rfl:    mometasone (ELOCON) 0.1 % cream, Apply 1 application topically daily., Disp: , Rfl:    triamcinolone cream (KENALOG) 0.1 %, Apply 1 Application topically 2 (two) times daily., Disp: , Rfl:    Calcium Carbonate Antacid (TUMS CHEWY BITES PO), Take 1 tablet by mouth daily as needed (acid reflux)., Disp: , Rfl:    montelukast (SINGULAIR) 10 MG tablet, TAKE 1 TABLET BY MOUTH EVERYDAY AT BEDTIME, Disp: 90 tablet, Rfl: 1   NIFEdipine (ADALAT CC) 30 MG 24 hr tablet, Take 1 tablet (30 mg total) by mouth daily., Disp: 30 tablet, Rfl: 2  Laboratory examination:   Lab Results  Component Value Date   NA 141 01/31/2023   K 4.6 01/31/2023   CO2 21 01/31/2023   GLUCOSE 102 (H) 01/31/2023   BUN 15 01/31/2023   CREATININE 0.93 01/31/2023   CALCIUM 9.9 01/31/2023   EGFR 62 01/31/2023   GFRNONAA >60 08/06/2022   GFRNONAA >60 08/06/2022       Latest Ref Rng & Units 01/31/2023    9:36 AM 08/29/2022   10:14 AM 08/06/2022    2:09 AM  BMP  Glucose 70 - 99 mg/dL 102  121  104   BUN 8 - 27 mg/dL '15  14  12   '$ Creatinine 0.57 - 1.00 mg/dL 0.93  0.87  0.72    0.72   BUN/Creat Ratio 12 - '28 16  16    '$ Sodium 134 - 144 mmol/L 141  139  139   Potassium 3.5 - 5.2 mmol/L 4.6  4.2  3.5   Chloride 96 - 106 mmol/L 102  102  109   CO2 20 - 29 mmol/L 21  21  23   Calcium 8.7 - 10.3 mg/dL 9.9  9.7  9.0       Latest Ref Rng & Units 01/31/2023    9:36 AM 08/29/2022   10:14 AM 04/11/2021    1:00 PM  Hepatic Function  Total Protein 6.0 - 8.5 g/dL 7.3  6.8  7.5   Albumin 3.7 - 4.7 g/dL 4.8  4.6  4.2   AST 0 - 40 IU/L '23  23  21   '$ ALT 0 - 32 IU/L '13  9  12   '$ Alk Phosphatase 44 - 121 IU/L 122  98  95   Total Bilirubin 0.0 - 1.2 mg/dL 0.2  0.4  0.4    Lab Results  Component Value Date   CHOL 256 (H) 12/18/2021   HDL 86 12/18/2021   LDLCALC 159 (H) 12/18/2021   TRIG 67 12/18/2021   CHOLHDL 2.6 10/31/2020     HEMOGLOBIN A1C No results found  for: "HGBA1C", "MPG" TSH Recent Labs    08/06/22 0119  TSH 5.293*   Component Ref Range & Units 08/06/2022  Vit D, 25-Hydroxy 30 - 100 ng/mL 21.70 Low     Radiology:    Cardiac Studies:   Renal artery duplex  07/30/2018: No E/O renal artery stenosis.   Sleep study 10/04/2021: No significant OSA. A critically low heart rate during PSG was present in wake and in sleep during recording, HR often in the 30s. A higher heart rate may prevent some arousals? No significant hypoxemia noted.   Coronary CTA 12/08/2019: 1. Coronary artery calcium score 108 Agatston units,  54th percentile for age and gender, suggesting intermediate risk for future cardiac events. 2.  Nonobstructive ostial left main calcified plaque.   PCV ECHOCARDIOGRAM COMPLETE 07/04/2021 Left ventricle cavity is normal in size and wall thickness. Normal global wall motion. Normal LV systolic function with EF 54%. Doppler evidence of grade II (pseudonormal) diastolic dysfunction, elevated LAP. Calculated EF 54%. Left atrial cavity is mildly dilated. Mild (Grade I) mitral regurgitation. Mild tricuspid regurgitation. No evidence of pulmonary hypertension.   Ambulatory cardiac telemetry 06/05/2021 - 06/12/2021: Predominant rhythm was sinus.  Maximum heart rate 125, minimum heart rate 32, average heart rate 47 bpm.  Patient with 2 episodes of atrial tachycardia with longest lasting 12.6 seconds.  Patient had single sinus pause lasting 3.1 seconds at 4:30 AM, asymptomatic.  Patient triggered events correlated with sinus bradycardia, PVCs, PACs, and ventricular trigeminy.  No evidence of high degree AV block or ventricular tachycardia.   EKG:   EKG 12/26/2022: Sinus tachycardia at rate of '1 1 4 '$ bpm, leftward axis.  Incomplete right bundle branch block.  No evidence of ischemia.  Compared to 07/04/2021, no significant change.  Previous heart rate was 61 bpm.  08/05/2022: Marked sinus bradycardia at the rate of 43 bpm otherwise normal  EKG.  Compared to 07/04/2022, heart rate was 61 bpm.  Compared to 06/05/2021, no significant change.    Assessment     ICD-10-CM   1. Primary hypertension  I10 NIFEdipine (ADALAT CC) 30 MG 24 hr tablet    2. Mixed hyperlipidemia  E78.2     3. Asymptomatic stenosis of left carotid artery  I65.22     4. Hypovitaminosis D  E55.9 cholecalciferol (VITAMIN D3) 10 MCG (400 UNIT) TABS tablet    VITAMIN D 25 Hydroxy (Vit-D Deficiency, Fractures)       Orders Placed This Encounter  Procedures   VITAMIN D 25 Hydroxy (Vit-D Deficiency, Fractures)  Meds ordered this encounter  Medications   NIFEdipine (ADALAT CC) 30 MG 24 hr tablet    Sig: Take 1 tablet (30 mg total) by mouth daily.    Dispense:  30 tablet    Refill:  2   cholecalciferol (VITAMIN D3) 10 MCG (400 UNIT) TABS tablet    Sig: Take 1 tablet (400 Units total) by mouth daily.    Dispense:  90 tablet    Refill:  1    Medications Discontinued During This Encounter  Medication Reason   acetaminophen (TYLENOL) 500 MG tablet    EPINEPHrine 0.3 mg/0.3 mL IJ SOAJ injection    famotidine (PEPCID) 20 MG tablet    tizanidine (ZANAFLEX) 2 MG capsule    NIFEdipine (ADALAT CC) 60 MG 24 hr tablet Reorder   Pyridoxine HCl (VITAMIN B6) 50 MG TABS Patient Preference   Vitamin D, Ergocalciferol, (DRISDOL) 1.25 MG (50000 UNIT) CAPS capsule Dose change       Recommendations:   Donna Baldwin is a 82 y.o.  AAF with no significant coronary disease but has mildly elevated coronary calcium score with noncritical distal left main minimal disease by coronary CTA in 2020, coronary calcium score in the 50th percentile, difficult to control hypertension, asymptomatic bilateral carotid stenosis being followed by vascular surgery, multiple medication allergies and intolerance of statins, achalasia cardia SP surgery in 2019, severe vitamin D deficiency, chronic thrombocytopenia felt to be ITP asymptomatic.   This is a 29-monthoffice visit.  She has  developed generalized urticaria, she could not have allergy testing done as her blood pressure was extremely high.  She is wondering whether she can reduce the dose of Procardia.  She brings home blood pressure recordings.  1. Primary hypertension Patient's blood pressure is under excellent control with Procardia 60 mg daily, patient is wondering whether her rash and urticaria is related to medication compliant, she would like to reduce the dose to 30 mg.  Advised her that she could completely discontinue this and as she has been tolerating hydralazine on a as needed basis, she could certainly go on hydralazine 3 times a day.  Patient prefers to go at a lower dose of 30 mg which I prescribed.  Advised her prior to allergy testing that she should probably take Procardia and also hydralazine 1 hour before her procedure and then have allergy testing done.  She has whitecoat hypertension as well.  Blood pressure was markedly elevated in office.  Home blood pressure recordings which she brings are under excellent control.  She has severe anxiety as well.  2. Mixed hyperlipidemia Patient has aortic atherosclerosis and also moderate stenosis of the carotid arteries being followed by vascular surgery.  Patient does not want to try any new medications for now and prefers to continue observation only.  4. Hypovitaminosis D She has been on vitamin D supplementation 50,000 units daily for quite some time now, I will obtain vitamin D levels, she was unable to swallow the capsule as she is "allergic to the capsule material".  I sent her tablet form to see whether she would tolerate this.  I will certainly keep her PCP involved and informed about the vitamin D levels.  I will see her back in 6 months for follow-up.   JAdrian Prows MD, F[facility]3/06/2023, 9:52 AM Office: [phone]

## 2023-03-07 ENCOUNTER — Other Ambulatory Visit: Payer: Self-pay | Admitting: Cardiology

## 2023-03-07 DIAGNOSIS — E559 Vitamin D deficiency, unspecified: Secondary | ICD-10-CM

## 2023-03-07 LAB — VITAMIN D 25 HYDROXY (VIT D DEFICIENCY, FRACTURES): Vit D, 25-Hydroxy: 35.3 ng/mL (ref 30.0–100.0)

## 2023-03-07 NOTE — Progress Notes (Signed)
Please call and let the patient know that she should continue with 50,000 units of vitamin D on a weekly basis and not to reduce the dose.

## 2023-03-07 NOTE — Progress Notes (Signed)
Yes and she has to put this on her calendar

## 2023-03-07 NOTE — Progress Notes (Signed)
Patient wants to know if she can go back to have labs drawn in 3 months to check the Vitamin D levels? If so, can you add lab orders? Please advise.

## 2023-03-12 DIAGNOSIS — D509 Iron deficiency anemia, unspecified: Secondary | ICD-10-CM | POA: Diagnosis not present

## 2023-03-12 DIAGNOSIS — E1169 Type 2 diabetes mellitus with other specified complication: Secondary | ICD-10-CM | POA: Diagnosis not present

## 2023-03-12 DIAGNOSIS — E440 Moderate protein-calorie malnutrition: Secondary | ICD-10-CM | POA: Diagnosis not present

## 2023-03-12 DIAGNOSIS — K219 Gastro-esophageal reflux disease without esophagitis: Secondary | ICD-10-CM | POA: Diagnosis not present

## 2023-03-12 DIAGNOSIS — E785 Hyperlipidemia, unspecified: Secondary | ICD-10-CM | POA: Diagnosis not present

## 2023-03-12 DIAGNOSIS — I10 Essential (primary) hypertension: Secondary | ICD-10-CM | POA: Diagnosis not present

## 2023-03-13 NOTE — Progress Notes (Signed)
Patient is aware and she wanted to let you know that she is taking Vitamin D as you directed and will put the date on her calendar as advised.

## 2023-03-21 ENCOUNTER — Telehealth: Payer: Self-pay | Admitting: *Deleted

## 2023-03-21 NOTE — Telephone Encounter (Signed)
Patient is requesting a refill of the ciclopirox-8% solution, please advise

## 2023-03-22 MED ORDER — CICLOPIROX 8 % EX SOLN: Freq: Every day | CUTANEOUS | 6 refills | Status: DC

## 2023-03-27 ENCOUNTER — Ambulatory Visit: Payer: Medicare PPO | Admitting: Cardiology

## 2023-04-07 DIAGNOSIS — R142 Eructation: Secondary | ICD-10-CM | POA: Diagnosis not present

## 2023-04-07 DIAGNOSIS — F419 Anxiety disorder, unspecified: Secondary | ICD-10-CM | POA: Diagnosis not present

## 2023-04-07 DIAGNOSIS — K220 Achalasia of cardia: Secondary | ICD-10-CM | POA: Diagnosis not present

## 2023-04-07 DIAGNOSIS — R1310 Dysphagia, unspecified: Secondary | ICD-10-CM | POA: Diagnosis not present

## 2023-04-11 ENCOUNTER — Ambulatory Visit: Payer: Medicare PPO | Admitting: Cardiology

## 2023-04-14 ENCOUNTER — Other Ambulatory Visit: Payer: Self-pay | Admitting: Family Medicine

## 2023-04-14 DIAGNOSIS — R0989 Other specified symptoms and signs involving the circulatory and respiratory systems: Secondary | ICD-10-CM

## 2023-04-14 DIAGNOSIS — E1169 Type 2 diabetes mellitus with other specified complication: Secondary | ICD-10-CM | POA: Diagnosis not present

## 2023-04-14 DIAGNOSIS — I700 Atherosclerosis of aorta: Secondary | ICD-10-CM | POA: Diagnosis not present

## 2023-04-14 DIAGNOSIS — E785 Hyperlipidemia, unspecified: Secondary | ICD-10-CM | POA: Diagnosis not present

## 2023-04-14 DIAGNOSIS — F411 Generalized anxiety disorder: Secondary | ICD-10-CM | POA: Diagnosis not present

## 2023-04-14 DIAGNOSIS — I10 Essential (primary) hypertension: Secondary | ICD-10-CM | POA: Diagnosis not present

## 2023-04-14 DIAGNOSIS — I6522 Occlusion and stenosis of left carotid artery: Secondary | ICD-10-CM | POA: Diagnosis not present

## 2023-04-14 DIAGNOSIS — G720 Drug-induced myopathy: Secondary | ICD-10-CM | POA: Diagnosis not present

## 2023-04-14 DIAGNOSIS — E559 Vitamin D deficiency, unspecified: Secondary | ICD-10-CM | POA: Diagnosis not present

## 2023-04-14 DIAGNOSIS — E119 Type 2 diabetes mellitus without complications: Secondary | ICD-10-CM | POA: Diagnosis not present

## 2023-04-15 DIAGNOSIS — E1169 Type 2 diabetes mellitus with other specified complication: Secondary | ICD-10-CM | POA: Diagnosis not present

## 2023-04-15 DIAGNOSIS — E785 Hyperlipidemia, unspecified: Secondary | ICD-10-CM | POA: Diagnosis not present

## 2023-04-15 DIAGNOSIS — K219 Gastro-esophageal reflux disease without esophagitis: Secondary | ICD-10-CM | POA: Diagnosis not present

## 2023-04-15 DIAGNOSIS — D509 Iron deficiency anemia, unspecified: Secondary | ICD-10-CM | POA: Diagnosis not present

## 2023-04-15 DIAGNOSIS — I10 Essential (primary) hypertension: Secondary | ICD-10-CM | POA: Diagnosis not present

## 2023-04-15 DIAGNOSIS — E559 Vitamin D deficiency, unspecified: Secondary | ICD-10-CM | POA: Diagnosis not present

## 2023-04-25 ENCOUNTER — Ambulatory Visit (INDEPENDENT_AMBULATORY_CARE_PROVIDER_SITE_OTHER): Payer: Medicare PPO | Admitting: Podiatry

## 2023-04-25 ENCOUNTER — Encounter: Payer: Self-pay | Admitting: Podiatry

## 2023-04-25 DIAGNOSIS — B351 Tinea unguium: Secondary | ICD-10-CM

## 2023-04-25 DIAGNOSIS — M79675 Pain in left toe(s): Secondary | ICD-10-CM

## 2023-04-25 DIAGNOSIS — M79674 Pain in right toe(s): Secondary | ICD-10-CM

## 2023-04-25 DIAGNOSIS — R1310 Dysphagia, unspecified: Secondary | ICD-10-CM | POA: Insufficient documentation

## 2023-04-25 NOTE — Patient Instructions (Signed)
Schedule appt with Dr. Lilian Kapur if great toe or hammertoes continue to bother you.  EPSOM SALT FOOT SOAK INSTRUCTIONS  Shopping List:  A. Plain epsom salt (not scented) B. Neosporin Cream   Place 1/4 cup of epsom salts in 2 quarts of warm tap water. IF YOU ARE DIABETIC, OR HAVE NEUROPATHY, CHECK THE TEMPERATURE OF THE WATER WITH YOUR ELBOW.   Submerge your foot/feet in the solution and soak for 10-15 minutes.      3.  Next, remove your foot/feet from solution, blot dry the affected area.    4.  Apply light amount of antibiotic cream/ointment and cover with fabric band-aid .  5.  This soak should be done once a day for 1-2 days.   6.  Monitor for any signs/symptoms of infection such as redness, swelling, odor, drainage, increased pain, or non-healing of digit.   7.  Please do not hesitate to call the office and speak to a Nurse or Doctor if you have questions.   8.  If you experience fever, chills, nightsweats, nausea or vomiting with worsening of digit/foot, please go to the emergency room.

## 2023-04-28 NOTE — Progress Notes (Signed)
Subjective:  Patient ID: Donna Baldwin, female    DOB: 12-30-1941,  MRN: [id number]  IRIANNA GILDAY presents to clinic today for painful elongated mycotic toenails 1-5 bilaterally which are tender when wearing enclosed shoe gear. Pain is relieved with periodic professional debridement.  Chief Complaint  Patient presents with   Diabetes    DFC BS - DIDN'T CHECK IT  A1C - 6.1 LVPCP - LAST WEEK   New problem(s): None.   PCP is Laurann Montana, MD.  Allergies  Allergen Reactions   Beef Allergy Anaphylaxis   Beef-Derived Products Anaphylaxis   Codeine Hives and Anxiety   Iodine Anaphylaxis   Iodine-131 Anaphylaxis   Ivp Dye [Iodinated Contrast Media] Anaphylaxis and Other (See Comments)   Latex Rash   Morphine And Related Other (See Comments)    Tremors, increased heart rate, excitement, confusion per pt   Penicillins Hives    Has patient had a PCN reaction causing immediate rash, facial/tongue/throat swelling, SOB or lightheadedness with hypotension: Yes Has patient had a PCN reaction causing severe rash involving mucus membranes or skin necrosis: Yes Has patient had a PCN reaction that required hospitalization: Yes Has patient had a PCN reaction occurring within the last 10 years: No If all of the above answers are "NO", then may proceed with Cephalosporin use.    Sertraline Other (See Comments)    Numbness in mouth and hyperactivity   Shellfish Allergy Hives   Solu-Medrol [Methylprednisolone] Other (See Comments)    Numbness and tingling in mouth   Sulfa Antibiotics Hives and Rash   Sulfasalazine Hives and Rash   Barbiturates Other (See Comments)    Excitement    Bee Pollen Hives   Bee Venom Hives   Diovan [Valsartan] Nausea Only   Gabapentin Other (See Comments)    Visual disturbance   Livalo [Pitavastatin] Swelling and Other (See Comments)    Facial swelling   Nutritional Supplements     Other reaction(s): Other (See Comments) Nuts, strawberries,  bicarbonate of soda, Nuts, strawberries, bicarbonate of soda,   Other Palpitations, Other (See Comments) and Rash    NARCOTIC ANALGESICS-TREMORS, INCREASED HEART RATE Hmg-Coa Reductase Inhibitors - intolerance unknown    Oxycodone Swelling, Rash and Cough   Promethazine-Dm Other (See Comments)    "Felt funny"   Tape Rash and Other (See Comments)    Red blotches    Wellness Essentials Blood Sugr [Nutritional Supplements] Other (See Comments)    Nuts, strawberries, bicarbonate of soda,   Albuterol Other (See Comments)    Caused wheezing   Ciprofloxacin Other (See Comments)   Crestor [Rosuvastatin] Other (See Comments)   Diltiazem Hcl     Other reaction(s): foot sores   Ezetimibe     Other reaction(s): myalgias   Famotidine Other (See Comments)    Unknown reaction   Oxycodone Hcl     Other reaction(s): hives, rash   Pred Forte [Prednisolone Acetate] Other (See Comments)    Patient is seeing odd color and images while using this   Welchol [Colesevelam]     Other reaction(s): myalgias   Celebrex [Celecoxib] Swelling and Other (See Comments)    Swelling and numbness (mouth)   Lanolin Itching   Mobic [Meloxicam] Swelling and Other (See Comments)    Swelling and numbness in mouth   Nickel Rash   Nitrates, Organic Rash and Other (See Comments)    Chest tightness also   Petrolatum Rash and Other (See Comments)   Soy Allergy Rash   Strawberry Extract  Rash    Review of Systems: Negative except as noted in the HPI.  Objective: No changes noted in today's physical examination. There were no vitals filed for this visit. JAQUAYA COYLE is a pleasant 82 y.o. female WD, WN in NAD. AAO x 3.  Vascular Examination: Capillary refill time immediate b/l. Vascular status intact b/l with palpable pedal pulses. Pedal hair present b/l. No edema. No pain with calf compression b/l. Skin temperature gradient WNL b/l.   Neurological Examination: Sensation grossly intact b/l with 10 gram  monofilament. Vibratory sensation intact b/l.   Dermatological Examination: Pedal skin with normal turgor, texture and tone b/l.  No open wounds. No interdigital macerations.   Toenails 1-5 b/l thick, discolored, elongated with subungual debris and pain on dorsal palpation.   No hyperkeratotic nor porokeratotic lesions present on today's visit.  Musculoskeletal Examination: Normal muscle strength 5/5 to all lower extremity muscle groups bilaterally. HAV with bunion bilaterally and hammertoes 2-5 b/l.[name] No pain, crepitus or joint limitation noted with ROM b/l LE.  Patient ambulates independently without assistive aids.  Radiographs: None  Assessment/Plan: 1. Pain due to onychomycosis of toenails of both feet     -Consent given for treatment as described below: -Examined patient. -Patient to continue soft, supportive shoe gear daily. -Toenails 1-5 b/l were debrided in length and girth with sterile nail nippers and dremel without iatrogenic bleeding.  -Patient/POA to call should there be question/concern in the interim.   Return in about 3 months (around 07/25/2023).  Freddie Breech, DPM

## 2023-04-29 DIAGNOSIS — L309 Dermatitis, unspecified: Secondary | ICD-10-CM | POA: Diagnosis not present

## 2023-04-29 DIAGNOSIS — L720 Epidermal cyst: Secondary | ICD-10-CM | POA: Diagnosis not present

## 2023-05-12 ENCOUNTER — Ambulatory Visit
Admission: RE | Admit: 2023-05-12 | Discharge: 2023-05-12 | Disposition: A | Discharge status: Home / Self Care | Payer: Medicare PPO | Source: Ambulatory Visit | Attending: Family Medicine | Admitting: Family Medicine

## 2023-05-12 DIAGNOSIS — R0989 Other specified symptoms and signs involving the circulatory and respiratory systems: Secondary | ICD-10-CM

## 2023-05-12 DIAGNOSIS — Z8679 Personal history of other diseases of the circulatory system: Secondary | ICD-10-CM | POA: Diagnosis not present

## 2023-05-14 DIAGNOSIS — D72819 Decreased white blood cell count, unspecified: Secondary | ICD-10-CM | POA: Diagnosis not present

## 2023-05-27 DIAGNOSIS — I10 Essential (primary) hypertension: Secondary | ICD-10-CM | POA: Diagnosis not present

## 2023-05-27 DIAGNOSIS — D509 Iron deficiency anemia, unspecified: Secondary | ICD-10-CM | POA: Diagnosis not present

## 2023-05-27 DIAGNOSIS — E1169 Type 2 diabetes mellitus with other specified complication: Secondary | ICD-10-CM | POA: Diagnosis not present

## 2023-05-27 DIAGNOSIS — E559 Vitamin D deficiency, unspecified: Secondary | ICD-10-CM | POA: Diagnosis not present

## 2023-05-27 DIAGNOSIS — E785 Hyperlipidemia, unspecified: Secondary | ICD-10-CM | POA: Diagnosis not present

## 2023-05-27 DIAGNOSIS — K219 Gastro-esophageal reflux disease without esophagitis: Secondary | ICD-10-CM | POA: Diagnosis not present

## 2023-05-30 ENCOUNTER — Other Ambulatory Visit: Payer: Self-pay | Admitting: Cardiology

## 2023-05-30 ENCOUNTER — Other Ambulatory Visit: Payer: Self-pay | Admitting: Podiatry

## 2023-05-30 DIAGNOSIS — I10 Essential (primary) hypertension: Secondary | ICD-10-CM

## 2023-06-26 ENCOUNTER — Ambulatory Visit (INDEPENDENT_AMBULATORY_CARE_PROVIDER_SITE_OTHER): Payer: Medicare PPO | Admitting: Vascular Surgery

## 2023-06-26 ENCOUNTER — Encounter: Payer: Self-pay | Admitting: Vascular Surgery

## 2023-06-26 VITALS — BP 150/90 | HR 101 | Temp 97.7°F | Resp 20 | Ht 65.0 in | Wt 104.0 lb

## 2023-06-26 DIAGNOSIS — I739 Peripheral vascular disease, unspecified: Secondary | ICD-10-CM | POA: Diagnosis not present

## 2023-06-26 DIAGNOSIS — I6522 Occlusion and stenosis of left carotid artery: Secondary | ICD-10-CM | POA: Diagnosis not present

## 2023-06-26 NOTE — Progress Notes (Signed)
REASON FOR VISIT:   Peripheral arterial disease.  The patient is referred by Dr. Laurann Montana.  MEDICAL ISSUES:   PERIPHERAL ARTERIAL DISEASE: Based on her noninvasive studies she has evidence of superficial femoral artery occlusive disease on the right and some popliteal and tibial disease on the left.  However her symptoms sound like neuropathy.  She describes paresthesias and some tightness in her feet that is persistent.  Her symptoms are equal on both sides.  On the left side she has a palpable femoral, popliteal, and dorsalis pedis pulse.  Thus I do not think her symptoms can be attributed to her peripheral arterial disease.  I do not get any history of claudication or rest pain so I would not recommend arteriography unless she develop new symptoms which I can attribute to peripheral arterial disease.  She is scheduled for a follow-up visit in November and would like to be seen by a physician at that time.  She knows that I will be retiring.  She does have follow-up ABIs ordered for that visit.  40 TO 59% ASYMPTOMATIC LEFT CAROTID STENOSIS: This patient has an asymptomatic moderate left carotid stenosis.  She is due for a follow-up carotid duplex scan in November.  She has not been taking aspirin.  I have again asked her to begin taking 81 mg of aspirin daily.  She has an intolerance to statins.  She is not a smoker.   HPI:   Donna Baldwin is a pleasant 82 y.o. female whom I saw last on 11/14/2022.  At that time she had an asymptomatic 40 to 59% left carotid stenosis.  I will follow-up duplex scan in 1 year which would be November of this year.  In addition she had peripheral arterial disease with monophasic Doppler signals in both feet.  However at that time she was asymptomatic.  She was to get follow-up studies when she came back in November.  She also had chronic venous insufficiency.  She is now referred back by Dr. Laurann Montana.  I have reviewed the records from the  referring office.  The patient was seen on 04/14/2023.  She is followed with diabetes, hypertension, and hyperlipidemia.  She also has known atherosclerosis of the aorta and mild protein calorie malnutrition.  Her hemoglobin A1c was 6.1 at that time.  Her creatinine was 0.88.  On my history, the patient denies any history of stroke, TIAs, expressive or receptive aphasia, or amaurosis fugax.  I do not get any history of claudication or rest pain.  Her chief complaint is a tightness in her feet and burning pain in her feet in addition to paresthesias.  She does have a history of diabetes and this sounds like neuropathy.  She also has some back pain issues.  Past Medical History:  Diagnosis Date   Acid reflux    Anemia    Anxiety    Back pain    GERD (gastroesophageal reflux disease)    Hyperlipidemia    Hypertensive heart disease without heart failure    Thrombocytopenia (HCC)    Vitamin D deficiency     Family History  Problem Relation Age of Onset   Cancer Father    Allergic rhinitis Neg Hx    Angioedema Neg Hx    Asthma Neg Hx    Atopy Neg Hx    Eczema Neg Hx    Immunodeficiency Neg Hx    Urticaria Neg Hx     SOCIAL HISTORY: Social History  Tobacco Use   Smoking status: Never   Smokeless tobacco: Never  Substance Use Topics   Alcohol use: No    Alcohol/week: 0.0 standard drinks of alcohol    Allergies  Allergen Reactions   Beef Allergy Anaphylaxis   Beef-Derived Products Anaphylaxis   Codeine Hives and Anxiety   Iodine Anaphylaxis   Iodine-131 Anaphylaxis   Ivp Dye [Iodinated Contrast Media] Anaphylaxis and Other (See Comments)   Latex Rash   Morphine And Codeine Other (See Comments)    Tremors, increased heart rate, excitement, confusion per pt   Penicillins Hives    Has patient had a PCN reaction causing immediate rash, facial/tongue/throat swelling, SOB or lightheadedness with hypotension: Yes Has patient had a PCN reaction causing severe rash involving  mucus membranes or skin necrosis: Yes Has patient had a PCN reaction that required hospitalization: Yes Has patient had a PCN reaction occurring within the last 10 years: No If all of the above answers are "NO", then may proceed with Cephalosporin use.    Sertraline Other (See Comments)    Numbness in mouth and hyperactivity   Shellfish Allergy Hives   Solu-Medrol [Methylprednisolone] Other (See Comments)    Numbness and tingling in mouth   Sulfa Antibiotics Hives and Rash   Sulfasalazine Hives and Rash   Barbiturates Other (See Comments)    Excitement    Bee Pollen Hives   Bee Venom Hives   Diovan [Valsartan] Nausea Only   Gabapentin Other (See Comments)    Visual disturbance   Livalo [Pitavastatin] Swelling and Other (See Comments)    Facial swelling   Nutritional Supplements     Other reaction(s): Other (See Comments) Nuts, strawberries, bicarbonate of soda, Nuts, strawberries, bicarbonate of soda,   Other Palpitations, Other (See Comments) and Rash    NARCOTIC ANALGESICS-TREMORS, INCREASED HEART RATE Hmg-Coa Reductase Inhibitors - intolerance unknown    Oxycodone Swelling, Rash and Cough   Promethazine-Dm Other (See Comments)    "Felt funny"   Tape Rash and Other (See Comments)    Red blotches    Wellness Essentials Blood Sugr [Nutritional Supplements] Other (See Comments)    Nuts, strawberries, bicarbonate of soda,   Albuterol Other (See Comments)    Caused wheezing   Ciprofloxacin Other (See Comments)   Crestor [Rosuvastatin] Other (See Comments)   Diltiazem Hcl     Other reaction(s): foot sores   Ezetimibe     Other reaction(s): myalgias   Famotidine Other (See Comments)    Unknown reaction   Oxycodone Hcl     Other reaction(s): hives, rash   Pred Forte [Prednisolone Acetate] Other (See Comments)    Patient is seeing odd color and images while using this   Welchol [Colesevelam]     Other reaction(s): myalgias   Celebrex [Celecoxib] Swelling and Other (See  Comments)    Swelling and numbness (mouth)   Lanolin Itching   Mobic [Meloxicam] Swelling and Other (See Comments)    Swelling and numbness in mouth   Nickel Rash   Nitrates, Organic Rash and Other (See Comments)    Chest tightness also   Petrolatum Rash and Other (See Comments)   Soy Allergy Rash   Strawberry Extract Rash    Current Outpatient Medications  Medication Sig Dispense Refill   Calcium Carbonate Antacid (TUMS CHEWY BITES PO) Take 1 tablet by mouth daily as needed (acid reflux).     cetirizine (ZYRTEC) 10 MG tablet Take 10 mg by mouth daily as needed for allergies.  cholecalciferol (VITAMIN D3) 10 MCG (400 UNIT) TABS tablet Take 1 tablet (400 Units total) by mouth daily. 90 tablet 1   ciclopirox (PENLAC) 8 % solution APPLY TOPICALLY AT BEDTIME. APPLY OVER NAIL AND SURROUNDING SKIN. APPLY DAILY OVER PREVIOUS COAT. AFTER SEVEN (7) DAYS, MAY REMOVE WITH ALCOHOL AND CONTINUE CYCLE. 6.6 mL 6   EPINEPHrine (EPIPEN 2-PAK) 0.3 mg/0.3 mL IJ SOAJ injection Inject 0.3 mg into the muscle as needed for anaphylaxis. 2 each 1   hydrALAZINE (APRESOLINE) 25 MG tablet Take 1 tablet (25 mg total) by mouth every 4 (four) hours as needed (SBP > 150 mm Hg.). (Patient taking differently: Take 25 mg by mouth every 4 (four) hours as needed (SBP > 140 mm Hg.).) 60 tablet 2   hydrOXYzine (ATARAX) 10 MG tablet TAKE 1 TABLET BY MOUTH THREE TIMES A DAY AS NEEDED (Patient taking differently: Take 10 mg by mouth daily.) 270 tablet 1   LORazepam (ATIVAN) 0.5 MG tablet Take 0.5 mg by mouth daily as needed for anxiety.     mometasone (ELOCON) 0.1 % cream Apply 1 application topically daily.     montelukast (SINGULAIR) 10 MG tablet TAKE 1 TABLET BY MOUTH EVERYDAY AT BEDTIME 90 tablet 1   NIFEdipine (ADALAT CC) 30 MG 24 hr tablet TAKE 1 TABLET BY MOUTH EVERY DAY 90 tablet 3   triamcinolone cream (KENALOG) 0.1 % Apply 1 Application topically 2 (two) times daily.     Vitamin D, Ergocalciferol, (DRISDOL) 1.25 MG  (50000 UNIT) CAPS capsule Take 50,000 Units by mouth every 7 (seven) days.     No current facility-administered medications for this visit.    REVIEW OF SYSTEMS:  [X]  denotes positive finding, [ ]  denotes negative finding Cardiac  Comments:  Chest pain or chest pressure:    Shortness of breath upon exertion:    Short of breath when lying flat:    Irregular heart rhythm:        Vascular    Pain in calf, thigh, or hip brought on by ambulation:    Pain in feet at night that wakes you up from your sleep:     Blood clot in your veins:    Leg swelling:         Pulmonary    Oxygen at home:    Productive cough:     Wheezing:         Neurologic    Sudden weakness in arms or legs:     Sudden numbness in arms or legs:     Sudden onset of difficulty speaking or slurred speech:    Temporary loss of vision in one eye:     Problems with dizziness:         Gastrointestinal    Blood in stool:     Vomited blood:         Genitourinary    Burning when urinating:     Blood in urine:        Psychiatric    Major depression:         Hematologic    Bleeding problems:    Problems with blood clotting too easily:        Skin    Rashes or ulcers:        Constitutional    Fever or chills:     PHYSICAL EXAM:   Vitals:   06/26/23 0845  BP: (!) 150/90  Pulse: (!) 101  Resp: 20  Temp: 97.7 F (36.5 C)  SpO2: 97%  Weight: 104 lb (47.2 kg)  Height: 5\' 5"  (1.651 m)    GENERAL: The patient is a well-nourished female, in no acute distress. The vital signs are documented above. CARDIAC: There is a regular rate and rhythm.  VASCULAR: I do not detect carotid bruits. On the right side she has a palpable femoral pulse.  I cannot palpate popliteal or pedal pulses. On the left side she has a palpable femoral, popliteal, and dorsalis pedis pulse. She has no significant lower extremity swelling. PULMONARY: There is good air exchange bilaterally without wheezing or rales. ABDOMEN: Soft and  non-tender with normal pitched bowel sounds.  I do not palpate an aneurysm. MUSCULOSKELETAL: There are no major deformities or cyanosis. NEUROLOGIC: No focal weakness or paresthesias are detected. SKIN: There are no ulcers or rashes noted. PSYCHIATRIC: The patient has a normal affect.  DATA:    ARTERIAL DOPPLER STUDY: I reviewed the arterial Doppler study that was done by [facility] on 05/12/2023.  On the right side ABI was 48%.  There was evidence of a superficial femoral artery occlusion on the right.  On the left side ABI was 72%.   Waverly Ferrari [facility] [phone]

## 2023-06-27 ENCOUNTER — Telehealth: Payer: Self-pay | Admitting: Medical Oncology

## 2023-06-27 NOTE — Telephone Encounter (Signed)
"  Plts 100k". Dr. Esmond Harps requests an apt before her 6 month appt.which is July 22 Pt denied any bleeding. She bleeds when she sticks her finger for blood glucose or scraps her skin , but it stops.

## 2023-06-30 ENCOUNTER — Telehealth: Payer: Self-pay

## 2023-06-30 NOTE — Telephone Encounter (Signed)
Donna Baldwin with [facility], pt's PCP called stating that the pt's platelets were at 100,000 as on last week. She requesting clarification on starting ASA 81 mg daily.  Msg sent to Dr. Edilia Baldwin to confirm.  Dr. Edilia Baldwin replied that pt could stop the ASA.  Donna Baldwin, relayed info and she asked that I call the pt to inform her.  Called pt, two identifiers used. Relayed info to pt, pt expressed appreciation for provider collaboration in her care. Confirmed understanding.

## 2023-07-12 ENCOUNTER — Other Ambulatory Visit: Payer: Self-pay

## 2023-07-12 DIAGNOSIS — I6529 Occlusion and stenosis of unspecified carotid artery: Secondary | ICD-10-CM

## 2023-07-12 DIAGNOSIS — I739 Peripheral vascular disease, unspecified: Secondary | ICD-10-CM

## 2023-07-21 ENCOUNTER — Inpatient Hospital Stay: Payer: Medicare PPO | Attending: Internal Medicine

## 2023-07-21 ENCOUNTER — Inpatient Hospital Stay (HOSPITAL_BASED_OUTPATIENT_CLINIC_OR_DEPARTMENT_OTHER): Payer: Medicare PPO | Admitting: Internal Medicine

## 2023-07-21 VITALS — BP 148/74 | HR 82 | Temp 97.8°F | Resp 17 | Ht 65.0 in | Wt 106.0 lb

## 2023-07-21 DIAGNOSIS — D72819 Decreased white blood cell count, unspecified: Secondary | ICD-10-CM | POA: Insufficient documentation

## 2023-07-21 DIAGNOSIS — D509 Iron deficiency anemia, unspecified: Secondary | ICD-10-CM | POA: Diagnosis not present

## 2023-07-21 DIAGNOSIS — D696 Thrombocytopenia, unspecified: Secondary | ICD-10-CM | POA: Diagnosis not present

## 2023-07-21 LAB — CBC WITH DIFFERENTIAL (CANCER CENTER ONLY)
Abs Immature Granulocytes: 0.01 10*3/uL (ref 0.00–0.07)
Basophils Absolute: 0 10*3/uL (ref 0.0–0.1)
Basophils Relative: 1 %
Eosinophils Absolute: 0 10*3/uL (ref 0.0–0.5)
Eosinophils Relative: 1 %
HCT: 39.9 % (ref 36.0–46.0)
Hemoglobin: 12.2 g/dL (ref 12.0–15.0)
Immature Granulocytes: 0 %
Lymphocytes Relative: 46 %
Lymphs Abs: 1.3 10*3/uL (ref 0.7–4.0)
MCH: 22.8 pg — ABNORMAL LOW (ref 26.0–34.0)
MCHC: 30.6 g/dL (ref 30.0–36.0)
MCV: 74.7 fL — ABNORMAL LOW (ref 80.0–100.0)
Monocytes Absolute: 0.3 10*3/uL (ref 0.1–1.0)
Monocytes Relative: 10 %
Neutro Abs: 1.2 10*3/uL — ABNORMAL LOW (ref 1.7–7.7)
Neutrophils Relative %: 42 %
Platelet Count: 114 10*3/uL — ABNORMAL LOW (ref 150–400)
RBC: 5.34 MIL/uL — ABNORMAL HIGH (ref 3.87–5.11)
RDW: 15.1 % (ref 11.5–15.5)
WBC Count: 2.8 10*3/uL — ABNORMAL LOW (ref 4.0–10.5)
nRBC: 0 % (ref 0.0–0.2)

## 2023-07-21 LAB — IRON AND IRON BINDING CAPACITY (CC-WL,HP ONLY)
Iron: 96 ug/dL (ref 28–170)
Saturation Ratios: 28 % (ref 10.4–31.8)
TIBC: 346 ug/dL (ref 250–450)
UIBC: 250 ug/dL (ref 148–442)

## 2023-07-21 LAB — FERRITIN: Ferritin: 78 ng/mL (ref 11–307)

## 2023-07-21 NOTE — Progress Notes (Signed)
[facility] [phone]   [phone]  OFFICE PROGRESS NOTE  Laurann Montana, MD [phone] [place] [address]  DIAGNOSIS:  1) Thrombocytopenia likely ITP. 2) microcytic anemia of unclear etiology.   PRIOR THERAPY: None  CURRENT THERAPY: Multivitamins with vitamin B12 supplements.  INTERVAL HISTORY: Donna Baldwin 82 y.o. female is to the clinic today for follow-up visit accompanied by her husband.  The patient is feeling fine today with no concerning complaints except for mild fatigue.  She was seen by her primary care provider recently and she was noted to have leukocytopenia and thrombocytopenia.  The patient is here today for reevaluation.  She denied having any current chest pain, shortness of breath, cough or hemoptysis.  She has no nausea, vomiting, diarrhea or constipation.  She has no headache or visual changes.  She used to take vitamin B12 supplement and multivitamin at regular basis but not recently.  She is here today for evaluation and repeat blood work.   MEDICAL HISTORY: Past Medical History:  Diagnosis Date   Acid reflux    Anemia    Anxiety    Back pain    GERD (gastroesophageal reflux disease)    Hyperlipidemia    Hypertensive heart disease without heart failure    Thrombocytopenia (HCC)    Vitamin D deficiency     ALLERGIES:  is allergic to beef allergy; beef-derived products; codeine; iodine; iodine-131; ivp dye [iodinated contrast media]; latex; morphine and codeine; penicillins; sertraline; shellfish allergy; solu-medrol [methylprednisolone]; sulfa antibiotics; sulfasalazine; barbiturates; bee pollen; bee venom; diovan [valsartan]; gabapentin; livalo [pitavastatin]; nutritional supplements; other; oxycodone; promethazine-dm; tape; wellness essentials blood sugr [nutritional supplements]; albuterol; ciprofloxacin; crestor [rosuvastatin]; diltiazem hcl; ezetimibe; famotidine; oxycodone hcl; pred  forte [prednisolone acetate]; welchol [colesevelam]; celebrex [celecoxib]; lanolin; mobic [meloxicam]; nickel; nitrates, organic; petrolatum; soy allergy; and strawberry extract.  MEDICATIONS:  Current Outpatient Medications  Medication Sig Dispense Refill   Calcium Carbonate Antacid (TUMS CHEWY BITES PO) Take 1 tablet by mouth daily as needed (acid reflux).     cetirizine (ZYRTEC) 10 MG tablet Take 10 mg by mouth daily as needed for allergies.     cholecalciferol (VITAMIN D3) 10 MCG (400 UNIT) TABS tablet Take 1 tablet (400 Units total) by mouth daily. 90 tablet 1   ciclopirox (PENLAC) 8 % solution APPLY TOPICALLY AT BEDTIME. APPLY OVER NAIL AND SURROUNDING SKIN. APPLY DAILY OVER PREVIOUS COAT. AFTER SEVEN (7) DAYS, MAY REMOVE WITH ALCOHOL AND CONTINUE CYCLE. 6.6 mL 6   EPINEPHrine (EPIPEN 2-PAK) 0.3 mg/0.3 mL IJ SOAJ injection Inject 0.3 mg into the muscle as needed for anaphylaxis. 2 each 1   hydrALAZINE (APRESOLINE) 25 MG tablet Take 1 tablet (25 mg total) by mouth every 4 (four) hours as needed (SBP > 150 mm Hg.). (Patient taking differently: Take 25 mg by mouth every 4 (four) hours as needed (SBP > 140 mm Hg.).) 60 tablet 2   hydrOXYzine (ATARAX) 10 MG tablet TAKE 1 TABLET BY MOUTH THREE TIMES A DAY AS NEEDED (Patient taking differently: Take 10 mg by mouth daily.) 270 tablet 1   LORazepam (ATIVAN) 0.5 MG tablet Take 0.5 mg by mouth daily as needed for anxiety.     mometasone (ELOCON) 0.1 % cream Apply 1 application topically daily.     montelukast (SINGULAIR) 10 MG tablet TAKE 1 TABLET BY MOUTH EVERYDAY AT BEDTIME 90 tablet 1   NIFEdipine (ADALAT CC) 30 MG 24 hr tablet TAKE 1 TABLET BY  MOUTH EVERY DAY 90 tablet 3   triamcinolone cream (KENALOG) 0.1 % Apply 1 Application topically 2 (two) times daily.     Vitamin D, Ergocalciferol, (DRISDOL) 1.25 MG (50000 UNIT) CAPS capsule Take 50,000 Units by mouth every 7 (seven) days.     No current facility-administered medications for this visit.     SURGICAL HISTORY:  Past Surgical History:  Procedure Laterality Date   ESOPHAGEAL MANOMETRY N/A 03/15/2020   Procedure: ESOPHAGEAL MANOMETRY (EM);  Surgeon: Charlott Rakes, MD;  Location: [facility];  Service: Endoscopy;  Laterality: N/A;   EYE SURGERY     HELLER MYOTOMY N/A 04/18/2020   Procedure: LAPAROSCOPIC HELLER MYOTOMY WITH UPPER ENDOSCOPY;  Surgeon: Kinsinger, De Blanch, MD;  Location: [facility];  Service: General;  Laterality: N/A;   PH IMPEDANCE STUDY N/A 03/15/2020   Procedure: PH IMPEDANCE STUDY;  Surgeon: Charlott Rakes, MD;  Location: [facility];  Service: Endoscopy;  Laterality: N/A;   SHOULDER SURGERY Bilateral    TONSILLECTOMY      REVIEW OF SYSTEMS:  A comprehensive review of systems was negative except for: Constitutional: positive for fatigue   PHYSICAL EXAMINATION: General appearance: alert, cooperative, fatigued, and no distress Head: Normocephalic, without obvious abnormality, atraumatic Neck: no adenopathy, no JVD, supple, symmetrical, trachea midline, and thyroid not enlarged, symmetric, no tenderness/mass/nodules Lymph nodes: Cervical, supraclavicular, and axillary nodes normal. Resp: clear to auscultation bilaterally Back: symmetric, no curvature. ROM normal. No CVA tenderness. Cardio: regular rate and rhythm, S1, S2 normal, no murmur, click, rub or gallop GI: soft, non-tender; bowel sounds normal; no masses,  no organomegaly Extremities: extremities normal, atraumatic, no cyanosis or edema  ECOG PERFORMANCE STATUS: 1 - Symptomatic but completely ambulatory  Blood pressure (!) 148/74, pulse 82, temperature 97.8 F (36.6 C), temperature source Oral, resp. rate 17, height 5\' 5"  (1.651 m), weight 106 lb (48.1 kg), SpO2 100%.  LABORATORY DATA: Lab Results  Component Value Date   WBC 2.8 (L) 07/21/2023   HGB 12.2 07/21/2023   HCT 39.9 07/21/2023   MCV 74.7 (L) 07/21/2023   PLT 114 (L) 07/21/2023      Chemistry      Component Value Date/Time    NA 141 01/31/2023 0936   K 4.6 01/31/2023 0936   CL 102 01/31/2023 0936   CO2 21 01/31/2023 0936   BUN 15 01/31/2023 0936   CREATININE 0.93 01/31/2023 0936   CREATININE 0.82 04/11/2021 1300   CREATININE 0.87 04/03/2017 1059      Component Value Date/Time   CALCIUM 9.9 01/31/2023 0936   ALKPHOS 122 (H) 01/31/2023 0936   AST 23 01/31/2023 0936   AST 21 04/11/2021 1300   ALT 13 01/31/2023 0936   ALT 12 04/11/2021 1300   BILITOT 0.2 01/31/2023 0936   BILITOT 0.4 04/11/2021 1300       RADIOGRAPHIC STUDIES: No results found.  ASSESSMENT AND PLAN: This is a very pleasant 82 years old white female with mild thrombocytopenia likely ITP in addition to mild microcytic anemia.  The patient underwent hemoglobin electrophoresis that showed normal pattern.   The patient is currently on multivitamins as well as B12 orally.  She is tolerating her treatment well. The patient had repeat CBC today that showed persistent leukocytopenia and thrombocytopenia but normal hemoglobin and hematocrit.  She has been on vitamin B12 and multivitamin supplements in the past but not recently. I recommended for the patient to resume her treatment with the vitamin B supplements in addition to iron tablets. I will arrange for  her to come back for follow-up visit in 3 months and if she continues to have persistent leukocytopenia and thrombocytopenia, I may consider her for a bone marrow biopsy and aspirate at that time. The patient was advised to call immediately if she has any concerning symptoms in the interval. The patient voices understanding of current disease status and treatment options and is in agreement with the current care plan.  All questions were answered. The patient knows to call the clinic with any problems, questions or concerns. We can certainly see the patient much sooner if necessary.  Disclaimer: This note was dictated with voice recognition software. Similar sounding words can inadvertently be  transcribed and may not be corrected upon review.

## 2023-07-22 DIAGNOSIS — I10 Essential (primary) hypertension: Secondary | ICD-10-CM | POA: Diagnosis not present

## 2023-07-22 DIAGNOSIS — K219 Gastro-esophageal reflux disease without esophagitis: Secondary | ICD-10-CM | POA: Diagnosis not present

## 2023-07-22 DIAGNOSIS — E785 Hyperlipidemia, unspecified: Secondary | ICD-10-CM | POA: Diagnosis not present

## 2023-07-22 DIAGNOSIS — D509 Iron deficiency anemia, unspecified: Secondary | ICD-10-CM | POA: Diagnosis not present

## 2023-07-22 DIAGNOSIS — E1169 Type 2 diabetes mellitus with other specified complication: Secondary | ICD-10-CM | POA: Diagnosis not present

## 2023-08-05 ENCOUNTER — Ambulatory Visit: Payer: Medicare PPO | Admitting: Podiatry

## 2023-08-05 DIAGNOSIS — E1169 Type 2 diabetes mellitus with other specified complication: Secondary | ICD-10-CM | POA: Diagnosis not present

## 2023-08-05 DIAGNOSIS — G720 Drug-induced myopathy: Secondary | ICD-10-CM | POA: Diagnosis not present

## 2023-08-05 DIAGNOSIS — I6522 Occlusion and stenosis of left carotid artery: Secondary | ICD-10-CM | POA: Diagnosis not present

## 2023-08-05 DIAGNOSIS — E1151 Type 2 diabetes mellitus with diabetic peripheral angiopathy without gangrene: Secondary | ICD-10-CM | POA: Diagnosis not present

## 2023-08-05 DIAGNOSIS — D72819 Decreased white blood cell count, unspecified: Secondary | ICD-10-CM | POA: Diagnosis not present

## 2023-08-05 DIAGNOSIS — E1142 Type 2 diabetes mellitus with diabetic polyneuropathy: Secondary | ICD-10-CM | POA: Diagnosis not present

## 2023-08-05 DIAGNOSIS — D696 Thrombocytopenia, unspecified: Secondary | ICD-10-CM | POA: Diagnosis not present

## 2023-08-05 DIAGNOSIS — E785 Hyperlipidemia, unspecified: Secondary | ICD-10-CM | POA: Diagnosis not present

## 2023-08-05 DIAGNOSIS — I10 Essential (primary) hypertension: Secondary | ICD-10-CM | POA: Diagnosis not present

## 2023-08-05 DIAGNOSIS — I700 Atherosclerosis of aorta: Secondary | ICD-10-CM | POA: Diagnosis not present

## 2023-08-11 ENCOUNTER — Encounter: Payer: Self-pay | Admitting: Podiatry

## 2023-08-11 ENCOUNTER — Ambulatory Visit: Payer: Medicare PPO | Admitting: Podiatry

## 2023-08-11 ENCOUNTER — Ambulatory Visit (INDEPENDENT_AMBULATORY_CARE_PROVIDER_SITE_OTHER): Payer: Medicare PPO | Admitting: Podiatry

## 2023-08-11 DIAGNOSIS — B351 Tinea unguium: Secondary | ICD-10-CM | POA: Diagnosis not present

## 2023-08-11 DIAGNOSIS — M79674 Pain in right toe(s): Secondary | ICD-10-CM

## 2023-08-11 DIAGNOSIS — M79675 Pain in left toe(s): Secondary | ICD-10-CM | POA: Diagnosis not present

## 2023-08-11 DIAGNOSIS — G609 Hereditary and idiopathic neuropathy, unspecified: Secondary | ICD-10-CM

## 2023-08-11 NOTE — Progress Notes (Signed)
  Subjective:  Patient ID: Donna Baldwin, female    DOB: November 16, 1941,   MRN: [id number]  Chief Complaint  Patient presents with   Nail Problem    Patient is here for RFC    82 y.o. female presents for concern of thickened elongated and painful nails that are difficult to trim. Requesting to have them trimmed today. Relates burning and tingling in their feet. Patient is diabetic and last A1c was No results found for: "HGBA1C" .   PCP:  Laurann Montana, MD    . Denies any other pedal complaints. Denies n/v/f/c.   Past Medical History:  Diagnosis Date   Acid reflux    Anemia    Anxiety    Back pain    GERD (gastroesophageal reflux disease)    Hyperlipidemia    Hypertensive heart disease without heart failure    Thrombocytopenia (HCC)    Vitamin D deficiency     Objective:  Vascular: DP/PT pulses 2/4 bilateral. CFT <3 seconds. Absent hair growth on digits. Edema noted to bilateral lower extremities. Xerosis noted bilaterally.  Skin. No lacerations or abrasions bilateral feet. Nails 1-5 bilateral  are thickened discolored and elongated with subungual debris.  Musculoskeletal: MMT 5/5 bilateral lower extremities in DF, PF, Inversion and Eversion. Deceased ROM in DF of ankle joint.  Neurological: Sensation intact to light touch. Protective sensation diminished bilateral.    Assessment:   1. Pain due to onychomycosis of toenails of both feet   2. Idiopathic peripheral neuropathy      Plan:  Patient was evaluated and treated and all questions answered. -Discussed and educated patient onfoot care, especially with  regards to the vascular, neurological and musculoskeletal systems.  -Discussed supportive shoes at all times and checking feet regularly.  -Mechanically debrided all nails 1-5 bilateral using sterile nail nipper and filed with dremel without incident  -Answered all patient questions -Patient to return  in 3 months for at risk foot care -Patient advised to call the  office if any problems or questions arise in the meantime.   Louann Sjogren, DPM

## 2023-08-28 ENCOUNTER — Other Ambulatory Visit: Payer: Self-pay | Admitting: Cardiology

## 2023-08-28 DIAGNOSIS — L509 Urticaria, unspecified: Secondary | ICD-10-CM

## 2023-09-02 DIAGNOSIS — E559 Vitamin D deficiency, unspecified: Secondary | ICD-10-CM | POA: Diagnosis not present

## 2023-09-04 ENCOUNTER — Encounter: Payer: Self-pay | Admitting: Cardiology

## 2023-09-04 ENCOUNTER — Ambulatory Visit: Payer: Medicare PPO | Admitting: Cardiology

## 2023-09-04 VITALS — BP 130/80 | HR 87 | Resp 16 | Ht 65.0 in | Wt 110.0 lb

## 2023-09-04 DIAGNOSIS — T466X5A Adverse effect of antihyperlipidemic and antiarteriosclerotic drugs, initial encounter: Secondary | ICD-10-CM | POA: Diagnosis not present

## 2023-09-04 DIAGNOSIS — E782 Mixed hyperlipidemia: Secondary | ICD-10-CM | POA: Diagnosis not present

## 2023-09-04 DIAGNOSIS — I739 Peripheral vascular disease, unspecified: Secondary | ICD-10-CM | POA: Diagnosis not present

## 2023-09-04 DIAGNOSIS — I10 Essential (primary) hypertension: Secondary | ICD-10-CM | POA: Diagnosis not present

## 2023-09-04 DIAGNOSIS — R002 Palpitations: Secondary | ICD-10-CM | POA: Diagnosis not present

## 2023-09-04 DIAGNOSIS — G720 Drug-induced myopathy: Secondary | ICD-10-CM

## 2023-09-04 MED ORDER — REPATHA SURECLICK 140 MG/ML ~~LOC~~ SOAJ
140.0000 mg | SUBCUTANEOUS | 2 refills | Status: DC
Start: 2023-09-04 — End: 2023-09-11

## 2023-09-04 NOTE — Progress Notes (Signed)
Primary Physician/Referring:  Laurann Montana, MD  Patient ID: Donna Baldwin, female    DOB: [birth year], 82 y.o.   MRN: [id number]  Chief Complaint  Patient presents with  . Hypertension  . Follow-up    6 month  . Asymptomatic stenosis of left carotid artery   HPI:    Donna Baldwin  is a 82 y.o. AAF with no significant coronary disease but has mildly elevated coronary calcium score with noncritical distal left main minimal disease by coronary CTA in 2020, coronary calcium score in the 50th percentile, difficult to control hypertension, asymptomatic bilateral carotid stenosis, severe peripheral arterial disease especially involving right lower extremity worse on the left, being followed by vascular surgery, multiple medication allergies and intolerance of statins, achalasia cardia SP surgery in 2019, severe vitamin D deficiency, chronic thrombocytopenia felt to be ITP asymptomatic.   This is a 69-month office visit.  She is presently complaining of occasional episodes of palpitation when she lays down but otherwise no other complaints apart from chronic leg cramps at night.  Denies symptoms of claudication while walking and states that she actually feels better when she goes shopping or does exercise.  Denies chest pain or dyspnea.  She brings home blood pressure recordings.   Past Medical History:  Diagnosis Date  . Acid reflux   . Anemia   . Anxiety   . Back pain   . GERD (gastroesophageal reflux disease)   . Hyperlipidemia   . Hypertensive heart disease without heart failure   . Thrombocytopenia (HCC)   . Vitamin D deficiency    Past Surgical History:  Procedure Laterality Date  . ESOPHAGEAL MANOMETRY N/A 03/15/2020   Procedure: ESOPHAGEAL MANOMETRY (EM);  Surgeon: Charlott Rakes, MD;  Location: [facility];  Service: Endoscopy;  Laterality: N/A;  . EYE SURGERY    . HELLER MYOTOMY N/A 04/18/2020   Procedure: LAPAROSCOPIC HELLER MYOTOMY WITH UPPER ENDOSCOPY;   Surgeon: Sheliah Hatch, De Blanch, MD;  Location: [facility];  Service: General;  Laterality: N/A;  . PH IMPEDANCE STUDY N/A 03/15/2020   Procedure: PH IMPEDANCE STUDY;  Surgeon: Charlott Rakes, MD;  Location: [facility];  Service: Endoscopy;  Laterality: N/A;  . SHOULDER SURGERY Bilateral   . TONSILLECTOMY     Family History  Problem Relation Age of Onset  . Cancer Father   . Allergic rhinitis Neg Hx   . Angioedema Neg Hx   . Asthma Neg Hx   . Atopy Neg Hx   . Eczema Neg Hx   . Immunodeficiency Neg Hx   . Urticaria Neg Hx     Social History   Tobacco Use  . Smoking status: Never  . Smokeless tobacco: Never  Substance Use Topics  . Alcohol use: No    Alcohol/week: 0.0 standard drinks of alcohol   Marital Status: Married  ROS  Review of Systems  Cardiovascular:  Negative for chest pain, dyspnea on exertion and leg swelling.   Objective      09/04/2023    9:22 AM 09/04/2023    9:04 AM 07/21/2023   11:21 AM  Vitals with BMI  Height  5\' 5"  5\' 5"   Weight  110 lbs 106 lbs  BMI  18.31 17.64  Systolic 130 155 308  Diastolic 80 79 74  Pulse  87 82   Today's Vitals   09/04/23 0904 09/04/23 0922  BP: (!) 155/79 130/80  Pulse: 87   Resp: 16   SpO2: 96%   Weight: 110 lb (49.9 kg)  Height: 5\' 5"  (1.651 m)      Body mass index is 18.3 kg/m.  Physical Exam Neck:     Vascular: Carotid bruit (soft bilateral) present. No JVD.  Cardiovascular:     Rate and Rhythm: Normal rate and regular rhythm.     Pulses: Intact distal pulses.          Popliteal pulses are 0 on the right side and 2+ on the left side.       Dorsalis pedis pulses are 0 on the right side and 0 on the left side.       Posterior tibial pulses are 0 on the right side and 1+ on the left side.     Heart sounds: Normal heart sounds. No murmur heard.    No gallop.  Pulmonary:     Effort: Pulmonary effort is normal.     Breath sounds: Normal breath sounds.  Abdominal:     General: Bowel sounds are normal.      Palpations: Abdomen is soft.  Musculoskeletal:     Right lower leg: No edema.     Left lower leg: No edema.   Medications and allergies   Allergies  Allergen Reactions  . Beef Allergy Anaphylaxis  . Beef-Derived Products Anaphylaxis  . Codeine Hives and Anxiety  . Iodine Anaphylaxis  . Iodine-131 Anaphylaxis  . Ivp Dye [Iodinated Contrast Media] Anaphylaxis and Other (See Comments)  . Latex Rash  . Morphine And Codeine Other (See Comments)    Tremors, increased heart rate, excitement, confusion per pt  . Penicillins Hives    Has patient had a PCN reaction causing immediate rash, facial/tongue/throat swelling, SOB or lightheadedness with hypotension: Yes Has patient had a PCN reaction causing severe rash involving mucus membranes or skin necrosis: Yes Has patient had a PCN reaction that required hospitalization: Yes Has patient had a PCN reaction occurring within the last 10 years: No If all of the above answers are "NO", then may proceed with Cephalosporin use.   . Sertraline Other (See Comments)    Numbness in mouth and hyperactivity  . Shellfish Allergy Hives  . Solu-Medrol [Methylprednisolone] Other (See Comments)    Numbness and tingling in mouth  . Sulfa Antibiotics Hives and Rash  . Sulfasalazine Hives and Rash  . Barbiturates Other (See Comments)    Excitement   . Bee Pollen Hives  . Bee Venom Hives  . Diovan [Valsartan] Nausea Only  . Gabapentin Other (See Comments)    Visual disturbance  . Livalo [Pitavastatin] Swelling and Other (See Comments)    Facial swelling  . Nutritional Supplements     Other reaction(s): Other (See Comments) Nuts, strawberries, bicarbonate of soda, Nuts, strawberries, bicarbonate of soda,  . Other Palpitations, Other (See Comments) and Rash    NARCOTIC ANALGESICS-TREMORS, INCREASED HEART RATE Hmg-Coa Reductase Inhibitors - intolerance unknown   . Oxycodone Swelling, Rash and Cough  . Promethazine-Dm Other (See Comments)    "Felt  funny"  . Tape Rash and Other (See Comments)    Red blotches   . Wellness Essentials Blood Sugr [Nutritional Supplements] Other (See Comments)    Nuts, strawberries, bicarbonate of soda,  . Albuterol Other (See Comments)    Caused wheezing  . Ciprofloxacin Other (See Comments)  . Crestor [Rosuvastatin] Other (See Comments)  . Diltiazem Hcl     Other reaction(s): foot sores  . Ezetimibe     Other reaction(s): myalgias  . Famotidine Other (See Comments)    Unknown reaction  .  Oxycodone Hcl     Other reaction(s): hives, rash  . Pred Forte [Prednisolone Acetate] Other (See Comments)    Patient is seeing odd color and images while using this  . Welchol [Colesevelam]     Other reaction(s): myalgias  . Celebrex [Celecoxib] Swelling and Other (See Comments)    Swelling and numbness (mouth)  . Lanolin Itching  . Mobic [Meloxicam] Swelling and Other (See Comments)    Swelling and numbness in mouth  . Nickel Rash  . Nitrates, Organic Rash and Other (See Comments)    Chest tightness also  . Petrolatum Rash and Other (See Comments)  . Soy Allergy Rash  . Strawberry Extract Rash     Medication list after today's encounter   Current Outpatient Medications:  .  aspirin EC 81 MG tablet, Take 81 mg by mouth as needed., Disp: , Rfl:  .  Calcium Carbonate Antacid (TUMS CHEWY BITES PO), Take 1 tablet by mouth daily as needed (acid reflux)., Disp: , Rfl:  .  cetirizine (ZYRTEC) 10 MG tablet, Take 10 mg by mouth daily as needed for allergies., Disp: , Rfl:  .  Cyanocobalamin (VITAMIN B-12 PO), Take 2,500 mcg by mouth daily., Disp: , Rfl:  .  EPINEPHrine (EPIPEN 2-PAK) 0.3 mg/0.3 mL IJ SOAJ injection, Inject 0.3 mg into the muscle as needed for anaphylaxis., Disp: 2 each, Rfl: 1 .  Evolocumab (REPATHA SURECLICK) 140 MG/ML SOAJ, Inject 140 mg into the skin every 14 (fourteen) days., Disp: 2 mL, Rfl: 2 .  hydrOXYzine (ATARAX) 10 MG tablet, Take 1 tablet (10 mg total) by mouth daily., Disp: 90  tablet, Rfl: 3 .  LORazepam (ATIVAN) 0.5 MG tablet, Take 0.5 mg by mouth daily as needed for anxiety., Disp: , Rfl:  .  mometasone (ELOCON) 0.1 % cream, Apply 1 application topically daily., Disp: , Rfl:  .  NIFEdipine (ADALAT CC) 30 MG 24 hr tablet, TAKE 1 TABLET BY MOUTH EVERY DAY, Disp: 90 tablet, Rfl: 3 .  triamcinolone cream (KENALOG) 0.1 %, Apply 1 Application topically 2 (two) times daily., Disp: , Rfl:  .  Vitamin D, Ergocalciferol, (DRISDOL) 1.25 MG (50000 UNIT) CAPS capsule, Take 50,000 Units by mouth every 7 (seven) days., Disp: , Rfl:   Laboratory examination:   Lab Results  Component Value Date   NA 141 01/31/2023   K 4.6 01/31/2023   CO2 21 01/31/2023   GLUCOSE 102 (H) 01/31/2023   BUN 15 01/31/2023   CREATININE 0.93 01/31/2023   CALCIUM 9.9 01/31/2023   EGFR 62 01/31/2023   GFRNONAA >60 08/06/2022   GFRNONAA >60 08/06/2022       Latest Ref Rng & Units 01/31/2023    9:36 AM 08/29/2022   10:14 AM 08/06/2022    2:09 AM  BMP  Glucose 70 - 99 mg/dL 712  458  099   BUN 8 - 27 mg/dL 15  14  12    Creatinine 0.57 - 1.00 mg/dL 8.33  8.25  0.53    9.76   BUN/Creat Ratio 12 - 28 16  16     Sodium 134 - 144 mmol/L 141  139  139   Potassium 3.5 - 5.2 mmol/L 4.6  4.2  3.5   Chloride 96 - 106 mmol/L 102  102  109   CO2 20 - 29 mmol/L 21  21  23    Calcium 8.7 - 10.3 mg/dL 9.9  9.7  9.0       Latest Ref Rng & Units 01/31/2023  9:36 AM 08/29/2022   10:14 AM 04/11/2021    1:00 PM  Hepatic Function  Total Protein 6.0 - 8.5 g/dL 7.3  6.8  7.5   Albumin 3.7 - 4.7 g/dL 4.8  4.6  4.2   AST 0 - 40 IU/L 23  23  21    ALT 0 - 32 IU/L 13  9  12    Alk Phosphatase 44 - 121 IU/L 122  98  95   Total Bilirubin 0.0 - 1.2 mg/dL 0.2  0.4  0.4    Component Ref Range & Units 08/06/2022  Vit D, 25-Hydroxy 30 - 100 ng/mL 21.70 Low     Last vitamin D Lab Results  Component Value Date   VD25OH 45.3 09/02/2023     External labs:   Cholesterol, total 248.000 m 04/14/2023 HDL 85.000 mg  04/14/2023 LDL 150.000 m 04/14/2023 Triglycerides 73.000 mg 04/14/2023  A1C 6.100 % 04/14/2023 TSH 2.920 10/01/2022  Hemoglobin 12.200 g/d 07/21/2023 Platelets 114.000 K/ 07/21/2023  Creatinine, Serum 0.880 mg/ 04/14/2023 CrCl Est 41.48 04/14/2023 eGFR 66.000 calc 04/14/2023 Potassium 4.200 mm 04/14/2023 ALT (SGPT) 13.000 U/L 04/14/2023  Radiology:    Cardiac Studies:   Renal artery duplex  07/30/2018: No E/O renal artery stenosis.   Sleep study 10/04/2021: No significant OSA. A critically low heart rate during PSG was present in wake and in sleep during recording, HR often in the 30s. A higher heart rate may prevent some arousals? No significant hypoxemia noted.   Coronary CTA 12/08/2019: 1. Coronary artery calcium score 108 Agatston units,  54th percentile for age and gender, suggesting intermediate risk for future cardiac events. 2.  Nonobstructive ostial left main calcified plaque.   PCV ECHOCARDIOGRAM COMPLETE 07/04/2021 Left ventricle cavity is normal in size and wall thickness. Normal global wall motion. Normal LV systolic function with EF 54%. Doppler evidence of grade II (pseudonormal) diastolic dysfunction, elevated LAP. Calculated EF 54%. Left atrial cavity is mildly dilated. Mild (Grade I) mitral regurgitation. Mild tricuspid regurgitation. No evidence of pulmonary hypertension.   Ambulatory cardiac telemetry 06/05/2021 - 06/12/2021: Predominant rhythm was sinus.  Maximum heart rate 125, minimum heart rate 32, average heart rate 47 bpm.  Patient with 2 episodes of atrial tachycardia with longest lasting 12.6 seconds.  Patient had single sinus pause lasting 3.1 seconds at 4:30 AM, asymptomatic.  Patient triggered events correlated with sinus bradycardia, PVCs, PACs, and ventricular trigeminy.  No evidence of high degree AV block or ventricular tachycardia.  Segmental pressure 05/12/2023: Right: Resting ankle brachial index: 0.48  Resting ABI in the moderate range arterial  occlusive disease. The segmental exam demonstrates SFA occlusion.   Left: Resting ankle brachial index: 0.72 Resting ABI in the mild range of arterial occlusive disease. Segmental exam demonstrates popliteal artery/tibial arterial occlusive disease.   EKG:   EKG 09/04/2023: Normal sinus rhythm with rate of 85 bpm, LAE, leftward axis, single PVC.  Assessment     ICD-10-CM   1. Palpitations  R00.2 EKG 12-Lead    2. Mixed hyperlipidemia  E78.2 Evolocumab (REPATHA SURECLICK) 140 MG/ML SOAJ    3. Primary hypertension  I10     4. PAD (peripheral artery disease) (HCC)  I73.9 Evolocumab (REPATHA SURECLICK) 140 MG/ML SOAJ    5. Statin myopathy  G72.0    T46.6X5A        Orders Placed This Encounter  Procedures  . EKG 12-Lead    Meds ordered this encounter  Medications  . Evolocumab (REPATHA SURECLICK) 140 MG/ML SOAJ  Sig: Inject 140 mg into the skin every 14 (fourteen) days.    Dispense:  2 mL    Refill:  2    Medications Discontinued During This Encounter  Medication Reason  . ciclopirox (PENLAC) 8 % solution   . hydrALAZINE (APRESOLINE) 25 MG tablet   . montelukast (SINGULAIR) 10 MG tablet   . cholecalciferol (VITAMIN D3) 10 MCG (400 UNIT) TABS tablet      Recommendations:   HASNA TAFF is a 82 y.o.  AAF with no significant coronary disease but has mildly elevated coronary calcium score with noncritical distal left main minimal disease by coronary CTA in 2020, coronary calcium score in the 50th percentile, difficult to control hypertension, asymptomatic bilateral carotid stenosis and severe peripheral arterial disease especially right lower extremity and 3 being followed by vascular surgery, multiple medication allergies and intolerance of statins and statin myopathy, achalasia cardia SP surgery in 2019, severe vitamin D deficiency, chronic thrombocytopenia felt to be ITP asymptomatic.   1. Palpitations Patient complains of palpitations especially at night.  Advised  her that we could certainly try some beta-blocker therapy, patient again states that she is allergic to multiple medications and does not want to be on any medications for this and she is fine as long as I do not think this is significant.  I reassured her. - EKG 12-Lead  2. Mixed hyperlipidemia Patient is willing to take Repatha, I will go ahead and send the prescription.  Will also do prior authorization if needed as she has statin intolerance.  She has severe peripheral artery disease involving right lower extremity with markedly reduced and severely abnormal ABI which I reviewed.  Fortunately there is no limb threatening ischemia, except for night cramps she denies any symptoms suggestive of claudication.  She has vascular surgery following this. - Evolocumab (REPATHA SURECLICK) 140 MG/ML SOAJ; Inject 140 mg into the skin every 14 (fourteen) days.  Dispense: 2 mL; Refill: 2  3. Primary hypertension Blood pressure was elevated upon presentation, patient brings home recordings which are in excellent control.  Hence I did not make any changes to her medications, she is not on an ACE inhibitor or ARB due to multiple medication allergies.  4. PAD (peripheral artery disease) (HCC) As dictated above, I reviewed her segmental pressures. - Evolocumab (REPATHA SURECLICK) 140 MG/ML SOAJ; Inject 140 mg into the skin every 14 (fourteen) days.  Dispense: 2 mL; Refill: 2  5. Statin myopathy She is unable to tolerate statins.  I have started her on Repatha.  As from cardiac standpoint she is stable, I am not offering much for her, I will see her back on a as needed basis.  Will request Dr. Laurann Montana to take over the prescription for Repatha once I get it approved.  Patient will let me know if she has any difficulty in obtaining the medication.    Yates Decamp, MD, [facility] 09/04/2023, 9:49 AM Office: [phone]

## 2023-09-10 ENCOUNTER — Telehealth: Payer: Self-pay

## 2023-09-10 DIAGNOSIS — E782 Mixed hyperlipidemia: Secondary | ICD-10-CM

## 2023-09-10 DIAGNOSIS — G720 Drug-induced myopathy: Secondary | ICD-10-CM

## 2023-09-10 DIAGNOSIS — I739 Peripheral vascular disease, unspecified: Secondary | ICD-10-CM

## 2023-09-10 NOTE — Telephone Encounter (Signed)
Patient states you was suppose to send over a diagnosis code to pharmacy on reasons on why she needs Repatha injections.

## 2023-09-11 MED ORDER — REPATHA SURECLICK 140 MG/ML ~~LOC~~ SOAJ
140.0000 mg | SUBCUTANEOUS | 3 refills | Status: DC
Start: 2023-09-11 — End: 2024-02-02

## 2023-09-11 NOTE — Telephone Encounter (Signed)
Please follow up  if she got Repatha from pharmacy and if she has issues let me know. Rx has been sent. Encourage her to use [facility]

## 2023-09-11 NOTE — Telephone Encounter (Signed)
Okay yes she is still having problems they want a reason why (diagnosis code) she has to get Repatha

## 2023-09-11 NOTE — Telephone Encounter (Signed)
ICD-10-CM   1. Statin myopathy  G72.0 Evolocumab (REPATHA SURECLICK) 140 MG/ML SOAJ   T46.6X5A     2. Mixed hyperlipidemia  E78.2 Evolocumab (REPATHA SURECLICK) 140 MG/ML SOAJ    3. PAD (peripheral artery disease) (HCC)  I73.9 Evolocumab (REPATHA SURECLICK) 140 MG/ML SOAJ     Meds ordered this encounter  Medications   Evolocumab (REPATHA SURECLICK) 140 MG/ML SOAJ    Sig: Inject 140 mg into the skin every 14 (fourteen) days.    Dispense:  6 mL    Refill:  3

## 2023-09-11 NOTE — Telephone Encounter (Signed)
Notified patient.

## 2023-09-15 DIAGNOSIS — K220 Achalasia of cardia: Secondary | ICD-10-CM | POA: Diagnosis not present

## 2023-09-15 DIAGNOSIS — R1310 Dysphagia, unspecified: Secondary | ICD-10-CM | POA: Diagnosis not present

## 2023-09-15 DIAGNOSIS — F419 Anxiety disorder, unspecified: Secondary | ICD-10-CM | POA: Diagnosis not present

## 2023-09-15 DIAGNOSIS — R142 Eructation: Secondary | ICD-10-CM | POA: Diagnosis not present

## 2023-09-22 ENCOUNTER — Telehealth: Payer: Self-pay | Admitting: Cardiology

## 2023-09-22 NOTE — Telephone Encounter (Signed)
Pt c/o medication issue:  1. Name of Medication:   Evolocumab (REPATHA SURECLICK) 140 MG/ML SOAJ    2. How are you currently taking this medication (dosage and times per day)? As written   3. Are you having a reaction (difficulty breathing--STAT)? No   4. What is your medication issue? Pt called in stating the pharmacy is still waiting for Dr. Jacinto Halim to send over something saying why the pt needs this medication. Please advise.

## 2023-09-23 ENCOUNTER — Other Ambulatory Visit (HOSPITAL_COMMUNITY): Payer: Self-pay

## 2023-09-23 ENCOUNTER — Telehealth: Payer: Self-pay | Admitting: Pharmacy Technician

## 2023-09-23 NOTE — Telephone Encounter (Signed)
Contacted patient. Needs training on how to self administer repatha injections. Patient prefers [address] location. Appt made for 10/4.

## 2023-09-23 NOTE — Telephone Encounter (Signed)
Pharmacy Patient Advocate Encounter   Received notification from Pt Calls Messages that prior authorization for repatha is required/requested.   Insurance verification completed.   The patient is insured through [employer] .   Per test claim: PA required; PA submitted to [facility] via CoverMyMeds Key/confirmation #/EOC [id number] Status is pending

## 2023-09-23 NOTE — Telephone Encounter (Signed)
Pt made aware. She states she has never done an injection before and unsure what to do.  Says Dr. Jacinto Halim told her she could come in to the office to receive the shot/be taught how to perform on self. Pt aware pharmD will follow up with her on this matter.  She is agreeable to plan.

## 2023-09-23 NOTE — Telephone Encounter (Signed)
Pharmacy Patient Advocate Encounter  Received notification from [facility] that Prior Authorization for repatha has been APPROVED from 09/23/23 to 12/30/23   PA #/Case ID/Reference #: [id number]

## 2023-09-29 DIAGNOSIS — E1169 Type 2 diabetes mellitus with other specified complication: Secondary | ICD-10-CM | POA: Diagnosis not present

## 2023-09-29 DIAGNOSIS — E785 Hyperlipidemia, unspecified: Secondary | ICD-10-CM | POA: Diagnosis not present

## 2023-10-03 ENCOUNTER — Ambulatory Visit: Payer: Medicare PPO

## 2023-10-08 ENCOUNTER — Ambulatory Visit (INDEPENDENT_AMBULATORY_CARE_PROVIDER_SITE_OTHER): Payer: Medicare PPO | Admitting: Podiatry

## 2023-10-08 DIAGNOSIS — M21612 Bunion of left foot: Secondary | ICD-10-CM

## 2023-10-08 DIAGNOSIS — M2042 Other hammer toe(s) (acquired), left foot: Secondary | ICD-10-CM | POA: Diagnosis not present

## 2023-10-08 DIAGNOSIS — G609 Hereditary and idiopathic neuropathy, unspecified: Secondary | ICD-10-CM | POA: Diagnosis not present

## 2023-10-08 DIAGNOSIS — M2012 Hallux valgus (acquired), left foot: Secondary | ICD-10-CM | POA: Diagnosis not present

## 2023-10-08 DIAGNOSIS — M10372 Gout due to renal impairment, left ankle and foot: Secondary | ICD-10-CM | POA: Diagnosis not present

## 2023-10-08 NOTE — Patient Instructions (Signed)
VISIT SUMMARY:  During your visit, we discussed your ongoing left foot pain, which has been present for about a month. You described the pain as a burning sensation located in your big toe and under your foot. We noted that your existing conditions, prediabetes and neuropathy, might be contributing to this discomfort. We also discussed your well-controlled prediabetes and the numbness in your foot due to peripheral neuropathy.  YOUR PLAN:  -LEFT FOOT PAIN: Your foot pain might be due to the shape of your toes and a bunion, as well as your neuropathy. We recommend trying shoes with more room for your toes and made of soft material. If this doesn't help, we might consider custom shoe inserts. We'll also run some tests to check if gout is causing your foot to feel hot.  -PERIPHERAL NEUROPATHY: This is a condition where the nerves in your feet are damaged, causing numbness but still allowing you to feel pain. We'll keep an eye on this. Please check your feet daily and avoid going barefoot to prevent injury.  -PREDIABETES: This is a condition where your blood sugar levels are higher than normal but not high enough to be diagnosed as diabetes. Your prediabetes is well controlled without medication, and we'll continue to monitor it.  INSTRUCTIONS:  Please try wearing shoes with more room for your toes and made of soft material. Check your feet daily and avoid going barefoot. We'll also run some tests to check for gout, so please look out for any communication about these tests. The shoes I recommended are called [employer], specifically the ATOM Slip on Shoe

## 2023-10-08 NOTE — Progress Notes (Signed)
  Subjective:  Patient ID: Donna Baldwin, female    DOB: 1941/08/18,  MRN: [id number]  Chief Complaint  Patient presents with   Foot Pain    Discussed the use of AI scribe software for clinical note transcription with the patient, who gave verbal consent to proceed.  History of Present Illness   The patient, with a history of prediabetes and neuropathy, presents with left foot pain that has been ongoing for about a month. The pain is located in the big toe and under the foot, and is described as a burning sensation. The patient has tried wearing different shoes with different arch supports to alleviate the pain, but to no avail. The patient denies any recent injuries. She has been diagnosed with neuropathy, which she believes is contributing to the pain. The patient also reports that her foot often feels hot. She has not been diagnosed with gout or kidney problems. The patient has been wearing thick socks and various types of shoes with pads to try to alleviate the pain. She reports that the pain is especially severe when walking.          Objective:    Physical Exam   EXTREMITIES: Left foot warm, well-perfused, pulse palpable. Hallux valgus deformity and semi-rigid hammer toe deformities, worst in second toe. Tenderness around MTP and IPJ of hallux and along tips of toes. Diffuse sensory loss to pressure and light touch in toes. Mild edema around joint, no heat.       No images are attached to the encounter.    Results          Assessment:   1. Idiopathic peripheral neuropathy   2. Acute gout due to renal impairment involving toe of left foot   3. Hammertoe of left foot   4. Hallux valgus with bunions, left      Plan:  Patient was evaluated and treated and all questions answered.  Assessment and Plan    Left Foot Pain   She experiences pain localized to the big toe and under the foot, which worsens with walking. The presence of hammer toes and a bunion likely  contributes to this discomfort, alongside neuropathy, which could be exacerbating the pain. Her prediabetic status is also noted. We will recommend shoes with ample room for the toes and soft material. Should new shoes not alleviate the pain, we will consider custom orthotics. Additionally, we will order labs to check for gout.  Peripheral Neuropathy   She reports numbness in the foot, yet pain is still perceived. Given her prediabetic status, we will continue monitoring. She is advised to check her feet daily and avoid going barefoot to prevent injury.  Prediabetes   Her prediabetes is well controlled without medication. Monitoring will continue.          Return if symptoms worsen or fail to improve.

## 2023-10-09 LAB — URIC ACID: Uric Acid: 3.5 mg/dL (ref 3.1–7.9)

## 2023-10-09 LAB — BASIC METABOLIC PANEL
BUN/Creatinine Ratio: 12 (ref 12–28)
BUN: 10 mg/dL (ref 8–27)
CO2: 25 mmol/L (ref 20–29)
Calcium: 9.6 mg/dL (ref 8.7–10.3)
Chloride: 104 mmol/L (ref 96–106)
Creatinine, Ser: 0.81 mg/dL (ref 0.57–1.00)
Glucose: 95 mg/dL (ref 70–99)
Potassium: 4.4 mmol/L (ref 3.5–5.2)
Sodium: 143 mmol/L (ref 134–144)
eGFR: 72 mL/min/{1.73_m2} (ref >59)

## 2023-10-20 ENCOUNTER — Inpatient Hospital Stay: Payer: Medicare PPO | Attending: Internal Medicine

## 2023-10-20 ENCOUNTER — Inpatient Hospital Stay (HOSPITAL_BASED_OUTPATIENT_CLINIC_OR_DEPARTMENT_OTHER): Payer: Medicare PPO | Admitting: Internal Medicine

## 2023-10-20 VITALS — BP 132/72 | HR 60 | Temp 97.5°F | Resp 15 | Ht 65.0 in | Wt 108.8 lb

## 2023-10-20 DIAGNOSIS — M79672 Pain in left foot: Secondary | ICD-10-CM | POA: Diagnosis not present

## 2023-10-20 DIAGNOSIS — D696 Thrombocytopenia, unspecified: Secondary | ICD-10-CM | POA: Insufficient documentation

## 2023-10-20 DIAGNOSIS — D539 Nutritional anemia, unspecified: Secondary | ICD-10-CM | POA: Diagnosis not present

## 2023-10-20 DIAGNOSIS — M79671 Pain in right foot: Secondary | ICD-10-CM | POA: Diagnosis not present

## 2023-10-20 DIAGNOSIS — D509 Iron deficiency anemia, unspecified: Secondary | ICD-10-CM | POA: Diagnosis not present

## 2023-10-20 DIAGNOSIS — M79641 Pain in right hand: Secondary | ICD-10-CM | POA: Diagnosis not present

## 2023-10-20 DIAGNOSIS — L989 Disorder of the skin and subcutaneous tissue, unspecified: Secondary | ICD-10-CM | POA: Insufficient documentation

## 2023-10-20 DIAGNOSIS — R5383 Other fatigue: Secondary | ICD-10-CM | POA: Diagnosis not present

## 2023-10-20 DIAGNOSIS — I10 Essential (primary) hypertension: Secondary | ICD-10-CM | POA: Insufficient documentation

## 2023-10-20 DIAGNOSIS — R634 Abnormal weight loss: Secondary | ICD-10-CM | POA: Diagnosis not present

## 2023-10-20 DIAGNOSIS — M79642 Pain in left hand: Secondary | ICD-10-CM | POA: Diagnosis not present

## 2023-10-20 LAB — IRON AND IRON BINDING CAPACITY (CC-WL,HP ONLY)
Iron: 80 ug/dL (ref 28–170)
Saturation Ratios: 24 % (ref 10.4–31.8)
TIBC: 337 ug/dL (ref 250–450)
UIBC: 257 ug/dL (ref 148–442)

## 2023-10-20 LAB — CBC WITH DIFFERENTIAL (CANCER CENTER ONLY)
Abs Immature Granulocytes: 0 10*3/uL (ref 0.00–0.07)
Basophils Absolute: 0 10*3/uL (ref 0.0–0.1)
Basophils Relative: 1 %
Eosinophils Absolute: 0 10*3/uL (ref 0.0–0.5)
Eosinophils Relative: 1 %
HCT: 38.2 % (ref 36.0–46.0)
Hemoglobin: 12.1 g/dL (ref 12.0–15.0)
Immature Granulocytes: 0 %
Lymphocytes Relative: 37 %
Lymphs Abs: 1.2 10*3/uL (ref 0.7–4.0)
MCH: 23.2 pg — ABNORMAL LOW (ref 26.0–34.0)
MCHC: 31.7 g/dL (ref 30.0–36.0)
MCV: 73.3 fL — ABNORMAL LOW (ref 80.0–100.0)
Monocytes Absolute: 0.3 10*3/uL (ref 0.1–1.0)
Monocytes Relative: 9 %
Neutro Abs: 1.6 10*3/uL — ABNORMAL LOW (ref 1.7–7.7)
Neutrophils Relative %: 52 %
Platelet Count: 120 10*3/uL — ABNORMAL LOW (ref 150–400)
RBC: 5.21 MIL/uL — ABNORMAL HIGH (ref 3.87–5.11)
RDW: 14.8 % (ref 11.5–15.5)
WBC Count: 3.2 10*3/uL — ABNORMAL LOW (ref 4.0–10.5)
nRBC: 0 % (ref 0.0–0.2)

## 2023-10-20 LAB — CMP (CANCER CENTER ONLY)
ALT: 12 U/L (ref 0–44)
AST: 21 U/L (ref 15–41)
Albumin: 4.4 g/dL (ref 3.5–5.0)
Alkaline Phosphatase: 104 U/L (ref 38–126)
Anion gap: 7 (ref 5–15)
BUN: 12 mg/dL (ref 8–23)
CO2: 29 mmol/L (ref 22–32)
Calcium: 9.6 mg/dL (ref 8.9–10.3)
Chloride: 107 mmol/L (ref 98–111)
Creatinine: 0.85 mg/dL (ref 0.44–1.00)
GFR, Estimated: >60 mL/min (ref >60)
Glucose, Bld: 99 mg/dL (ref 70–99)
Potassium: 3.9 mmol/L (ref 3.5–5.1)
Sodium: 143 mmol/L (ref 135–145)
Total Bilirubin: 0.5 mg/dL (ref 0.3–1.2)
Total Protein: 7.3 g/dL (ref 6.5–8.1)

## 2023-10-20 LAB — FERRITIN: Ferritin: 71 ng/mL (ref 11–307)

## 2023-10-20 LAB — FOLATE: Folate: 10.4 ng/mL (ref >5.9)

## 2023-10-20 LAB — VITAMIN B12: Vitamin B-12: 3743 pg/mL — ABNORMAL HIGH (ref 180–914)

## 2023-10-20 NOTE — Telephone Encounter (Signed)
Spoke with patient, informed her we did receive a few Covid Vaccines and was wondering if patient was still interested in receiving the Vaccine. Patient stated that she got lab work done and doesn't feel she needs to be re-vaccinated.

## 2023-10-20 NOTE — Progress Notes (Signed)
[facility] [phone]   [phone]  OFFICE PROGRESS NOTE  Donna Montana, MD [phone] Donna Baldwin [address] [place] [address]  DIAGNOSIS:  1) Thrombocytopenia likely ITP. 2) microcytic anemia of unclear etiology.   PRIOR THERAPY: None  CURRENT THERAPY: Multivitamins with vitamin B12 supplements.  INTERVAL HISTORY: OSHYN Baldwin 82 y.o. female returns to the clinic today for follow-up visit accompanied by her husband.Discussed the use of AI scribe software for clinical note transcription with the patient, who gave verbal consent to proceed.  History of Present Illness   Donna Baldwin, an 82 year old patient with a history of mild anemia and thrombocytopenia, presents with ongoing fatigue and lack of energy. She has been taking multivitamins, B12 supplements, and recently started iron with vitamin C. Despite these interventions, she reports feeling tired and has noticed dark areas around her thumbs. She also reports a slight weight loss over the past two months, dropping from 110 to 108 pounds.  In addition to fatigue, the patient complains of persistent pain in her fingers and feet, necessitating the use of high shoes. She also reports cold hands and has noticed a new mole on her skin, present for about a month.  The patient has not undergone a bone marrow biopsy, but expresses some concern about her condition. She reports occasional nosebleeds but denies any other areas of bruising or bleeding. She has noticed some redness in the area where blood was drawn a week ago, but this has since resolved.  The patient is considering whether to proceed with a bone marrow biopsy to investigate the cause of her low platelet count. She is also interested in dietary changes that might improve her blood pressure. She has a known genetic trait that results in small red blood cells, which may be contributing to her symptoms.        MEDICAL  HISTORY: Past Medical History:  Diagnosis Date   Acid reflux    Anemia    Anxiety    Back pain    GERD (gastroesophageal reflux disease)    Hyperlipidemia    Hypertensive heart disease without heart failure    Thrombocytopenia (HCC)    Vitamin D deficiency     ALLERGIES:  is allergic to beef allergy; beef-derived products; codeine; iodine; iodine-131; ivp dye [iodinated contrast media]; latex; morphine and codeine; penicillins; sertraline; shellfish allergy; solu-medrol [methylprednisolone]; sulfa antibiotics; sulfasalazine; barbiturates; bee pollen; bee venom; bioflavonoid products; diovan [valsartan]; gabapentin; livalo [pitavastatin]; other; oxycodone; promethazine-dm; tape; wellness essentials blood sugr [nutritional supplements]; albuterol; ciprofloxacin; crestor [rosuvastatin]; diltiazem hcl; ezetimibe; famotidine; oxycodone hcl; pred forte [prednisolone acetate]; welchol [colesevelam]; celebrex [celecoxib]; lanolin; mobic [meloxicam]; nickel; nitrates, organic; petrolatum; soy allergy; and strawberry extract.  MEDICATIONS:  Current Outpatient Medications  Medication Sig Dispense Refill   aspirin EC 81 MG tablet Take 81 mg by mouth as needed.     Calcium Carbonate Antacid (TUMS CHEWY BITES PO) Take 1 tablet by mouth daily as needed (acid reflux).     cetirizine (ZYRTEC) 10 MG tablet Take 10 mg by mouth daily as needed for allergies.     Cyanocobalamin (VITAMIN B-12 PO) Take 2,500 mcg by mouth daily.     EPINEPHrine (EPIPEN 2-PAK) 0.3 mg/0.3 mL IJ SOAJ injection Inject 0.3 mg into the muscle as needed for anaphylaxis. 2 each 1   Evolocumab (REPATHA SURECLICK) 140 MG/ML SOAJ Inject 140 mg into the skin every 14 (fourteen) days. 6 mL 3   hydrOXYzine (ATARAX) 10 MG  tablet Take 1 tablet (10 mg total) by mouth daily. 90 tablet 3   LORazepam (ATIVAN) 0.5 MG tablet Take 0.5 mg by mouth daily as needed for anxiety.     mometasone (ELOCON) 0.1 % cream Apply 1 application topically daily.      NIFEdipine (ADALAT CC) 30 MG 24 hr tablet TAKE 1 TABLET BY MOUTH EVERY DAY 90 tablet 3   triamcinolone cream (KENALOG) 0.1 % Apply 1 Application topically 2 (two) times daily.     Vitamin D, Ergocalciferol, (DRISDOL) 1.25 MG (50000 UNIT) CAPS capsule Take 50,000 Units by mouth every 7 (seven) days.     No current facility-administered medications for this visit.    SURGICAL HISTORY:  Past Surgical History:  Procedure Laterality Date   ESOPHAGEAL MANOMETRY N/A 03/15/2020   Procedure: ESOPHAGEAL MANOMETRY (EM);  Surgeon: Charlott Rakes, MD;  Location: [facility];  Service: Endoscopy;  Laterality: N/A;   EYE SURGERY     HELLER MYOTOMY N/A 04/18/2020   Procedure: LAPAROSCOPIC HELLER MYOTOMY WITH UPPER ENDOSCOPY;  Surgeon: Kinsinger, De Blanch, MD;  Location: [facility];  Service: General;  Laterality: N/A;   PH IMPEDANCE STUDY N/A 03/15/2020   Procedure: PH IMPEDANCE STUDY;  Surgeon: Charlott Rakes, MD;  Location: [facility];  Service: Endoscopy;  Laterality: N/A;   SHOULDER SURGERY Bilateral    TONSILLECTOMY      REVIEW OF SYSTEMS:  Constitutional: positive for fatigue Eyes: negative Ears, nose, mouth, throat, and face: negative Respiratory: negative Cardiovascular: negative Gastrointestinal: negative Genitourinary:negative Integument/breast: positive for skin color change Hematologic/lymphatic: negative Musculoskeletal:negative Neurological: negative Behavioral/Psych: negative Endocrine: negative Allergic/Immunologic: negative   PHYSICAL EXAMINATION: General appearance: alert, cooperative, fatigued, and no distress Head: Normocephalic, without obvious abnormality, atraumatic Neck: no adenopathy, no JVD, supple, symmetrical, trachea midline, and thyroid not enlarged, symmetric, no tenderness/mass/nodules Lymph nodes: Cervical, supraclavicular, and axillary nodes normal. Resp: clear to auscultation bilaterally Back: symmetric, no curvature. ROM normal. No CVA  tenderness. Cardio: regular rate and rhythm, S1, S2 normal, no murmur, click, rub or gallop GI: soft, non-tender; bowel sounds normal; no masses,  no organomegaly Extremities: extremities normal, atraumatic, no cyanosis or edema Neurologic: Alert and oriented X 3, normal strength and tone. Normal symmetric reflexes. Normal coordination and gait  ECOG PERFORMANCE STATUS: 1 - Symptomatic but completely ambulatory  Blood pressure 132/72, pulse 60, temperature (!) 97.5 F (36.4 C), temperature source Oral, resp. rate 15, height 5\' 5"  (1.651 m), weight 108 lb 12.8 oz (49.4 kg), SpO2 100%.  LABORATORY DATA: Lab Results  Component Value Date   WBC 3.2 (L) 10/20/2023   HGB 12.1 10/20/2023   HCT 38.2 10/20/2023   MCV 73.3 (L) 10/20/2023   PLT 120 (L) 10/20/2023      Chemistry      Component Value Date/Time   NA 143 10/20/2023 1021   NA 143 10/08/2023 1123   K 3.9 10/20/2023 1021   CL 107 10/20/2023 1021   CO2 29 10/20/2023 1021   BUN 12 10/20/2023 1021   BUN 10 10/08/2023 1123   CREATININE 0.85 10/20/2023 1021   CREATININE 0.87 04/03/2017 1059      Component Value Date/Time   CALCIUM 9.6 10/20/2023 1021   ALKPHOS 104 10/20/2023 1021   AST 21 10/20/2023 1021   ALT 12 10/20/2023 1021   BILITOT 0.5 10/20/2023 1021       RADIOGRAPHIC STUDIES: No results found.  ASSESSMENT AND PLAN: This is a very pleasant 82 years old white female with mild thrombocytopenia likely ITP in addition  to mild microcytic anemia.  The patient underwent hemoglobin electrophoresis that showed normal pattern.   The patient is currently on multivitamins as well as B12 orally.  She is tolerating her treatment well.    Mild Anemia and Thrombocytopenia Stable with slight improvement in platelet count and hemoglobin. Patient reports fatigue and weight loss. Currently on multivitamins, B12 supplements, and iron with vitamin C. -Continue current regimen of multivitamins, B12 supplements, and iron with vitamin  C. -Consider bone marrow biopsy if symptoms worsen or patient desires further investigation into cause of anemia and thrombocytopenia. -Return in 6 months for follow-up, or sooner if symptoms worsen.  Skin Lesion New skin lesion noted by patient, present for about a month. -Recommend evaluation by dermatologist or primary care physician.  Pain and Cold Sensation in Hands and Feet Chronic issue, possibly related to anemia. -Continue current treatment and monitor for changes.  Hypertension Not directly addressed in conversation, but mentioned in passing. -Continue current treatment regimen and healthy diet.   The patient was advised to call immediately if she has any concerning symptoms in the interval. The patient voices understanding of current disease status and treatment options and is in agreement with the current care plan.  All questions were answered. The patient knows to call the clinic with any problems, questions or concerns. We can certainly see the patient much sooner if necessary. The total time spent in the appointment was 30 minutes.  Disclaimer: This note was dictated with voice recognition software. Similar sounding words can inadvertently be transcribed and may not be corrected upon review.

## 2023-11-05 ENCOUNTER — Encounter: Payer: Self-pay | Admitting: Podiatry

## 2023-11-05 ENCOUNTER — Ambulatory Visit (INDEPENDENT_AMBULATORY_CARE_PROVIDER_SITE_OTHER): Payer: Medicare PPO | Admitting: Podiatry

## 2023-11-05 DIAGNOSIS — B351 Tinea unguium: Secondary | ICD-10-CM | POA: Diagnosis not present

## 2023-11-05 DIAGNOSIS — M2041 Other hammer toe(s) (acquired), right foot: Secondary | ICD-10-CM

## 2023-11-05 DIAGNOSIS — M79675 Pain in left toe(s): Secondary | ICD-10-CM

## 2023-11-05 DIAGNOSIS — G609 Hereditary and idiopathic neuropathy, unspecified: Secondary | ICD-10-CM

## 2023-11-05 DIAGNOSIS — M79674 Pain in right toe(s): Secondary | ICD-10-CM

## 2023-11-05 DIAGNOSIS — E119 Type 2 diabetes mellitus without complications: Secondary | ICD-10-CM | POA: Diagnosis not present

## 2023-11-05 DIAGNOSIS — M21612 Bunion of left foot: Secondary | ICD-10-CM

## 2023-11-05 DIAGNOSIS — M2012 Hallux valgus (acquired), left foot: Secondary | ICD-10-CM | POA: Diagnosis not present

## 2023-11-05 NOTE — Patient Instructions (Signed)
Check [employer] for [employer] shoes and sneakers  [employer] for [employer] and [employer]

## 2023-11-07 NOTE — Progress Notes (Signed)
ANNUAL DIABETIC FOOT EXAM  Subjective: Donna Baldwin presents today for annual diabetic foot exam. She is accompanied by her daughter on today's visit. She would like suggestions on shoe gear as her feet hurt when she ambulates due to foot deformities. Diagnosed by Dr. Cliffton Asters. Chief Complaint  Patient presents with   Diabetes    DFC BS - DIDN'T CHECK IT THIS MORNING [facility] - [facility] - 08/2023   Patient confirms h/o diabetes.  Patient denies any h/o foot wounds.  Patient has been diagnosed with neuropathy.  Donna Montana, MD is patient's PCP.  Past Medical History:  Diagnosis Date   Acid reflux    Anemia    Anxiety    Back pain    GERD (gastroesophageal reflux disease)    Hyperlipidemia    Hypertensive heart disease without heart failure    Thrombocytopenia (HCC)    Vitamin D deficiency    Patient Active Problem List   Diagnosis Date Noted   Dysphagia 04/25/2023   Chest pain 08/06/2022   Bradycardia 08/05/2022   Circadian rhythm sleep disorder, advanced sleep phase type 08/07/2021   Bradycardia with 31-40 beats per minute 08/07/2021   Primary hypertension 08/07/2021   Snoring 08/07/2021   Excessive daytime sleepiness 08/07/2021   Palpitations 08/07/2021   Microcytic anemia 04/11/2021   Achalasia 04/18/2020   Abnormal CT of liver 12/20/2019   Coronary artery disease involving native coronary artery of native heart without angina pectoris 12/20/2019   Palpitation 02/08/2019   Interstitial cystitis 08/25/2018   Nonintractable headache    TIA (transient ischemic attack) 07/06/2018   History of diabetes mellitus 04/01/2017   Transient retinal artery occlusion of both eyes 07/07/2014   Hereditary and idiopathic peripheral neuropathy 07/07/2014   Aortic atherosclerosis (HCC) 12/03/2013   Mixed hyperlipidemia 12/03/2013   GERD (gastroesophageal reflux disease) 03/24/2013   Obstructive sleep apnea, ruled out 02/28/2013   Urticaria due to food allergy 08/31/2012    Thrombocytopenia (HCC)    Past Surgical History:  Procedure Laterality Date   ESOPHAGEAL MANOMETRY N/A 03/15/2020   Procedure: ESOPHAGEAL MANOMETRY (EM);  Surgeon: Charlott Rakes, MD;  Location: [facility];  Service: Endoscopy;  Laterality: N/A;   EYE SURGERY     HELLER MYOTOMY N/A 04/18/2020   Procedure: LAPAROSCOPIC HELLER MYOTOMY WITH UPPER ENDOSCOPY;  Surgeon: Kinsinger, De Blanch, MD;  Location: [facility];  Service: General;  Laterality: N/A;   PH IMPEDANCE STUDY N/A 03/15/2020   Procedure: PH IMPEDANCE STUDY;  Surgeon: Charlott Rakes, MD;  Location: [facility];  Service: Endoscopy;  Laterality: N/A;   SHOULDER SURGERY Bilateral    TONSILLECTOMY     Current Outpatient Medications on File Prior to Visit  Medication Sig Dispense Refill   aspirin EC 81 MG tablet Take 81 mg by mouth as needed.     Calcium Carbonate Antacid (TUMS CHEWY BITES PO) Take 1 tablet by mouth daily as needed (acid reflux).     cetirizine (ZYRTEC) 10 MG tablet Take 10 mg by mouth daily as needed for allergies.     Cyanocobalamin (VITAMIN B-12 PO) Take 2,500 mcg by mouth daily.     EPINEPHrine (EPIPEN 2-PAK) 0.3 mg/0.3 mL IJ SOAJ injection Inject 0.3 mg into the muscle as needed for anaphylaxis. 2 each 1   Evolocumab (REPATHA SURECLICK) 140 MG/ML SOAJ Inject 140 mg into the skin every 14 (fourteen) days. 6 mL 3   hydrOXYzine (ATARAX) 10 MG tablet Take 1 tablet (10 mg total) by mouth daily. 90 tablet 3  LORazepam (ATIVAN) 0.5 MG tablet Take 0.5 mg by mouth daily as needed for anxiety.     mometasone (ELOCON) 0.1 % cream Apply 1 application topically daily.     NIFEdipine (ADALAT CC) 30 MG 24 hr tablet TAKE 1 TABLET BY MOUTH EVERY DAY 90 tablet 3   triamcinolone cream (KENALOG) 0.1 % Apply 1 Application topically 2 (two) times daily.     Vitamin D, Ergocalciferol, (DRISDOL) 1.25 MG (50000 UNIT) CAPS capsule Take 50,000 Units by mouth every 7 (seven) days.     No current facility-administered medications on  file prior to visit.    Allergies  Allergen Reactions   Beef Allergy Anaphylaxis   Beef-Derived Products Anaphylaxis   Codeine Hives and Anxiety   Iodine Anaphylaxis   Iodine-131 Anaphylaxis   Ivp Dye [Iodinated Contrast Media] Anaphylaxis and Other (See Comments)   Latex Rash   Morphine And Codeine Other (See Comments)    Tremors, increased heart rate, excitement, confusion per pt   Penicillins Hives    Has patient had a PCN reaction causing immediate rash, facial/tongue/throat swelling, SOB or lightheadedness with hypotension: Yes Has patient had a PCN reaction causing severe rash involving mucus membranes or skin necrosis: Yes Has patient had a PCN reaction that required hospitalization: Yes Has patient had a PCN reaction occurring within the last 10 years: No If all of the above answers are "NO", then may proceed with Cephalosporin use.    Sertraline Other (See Comments)    Numbness in mouth and hyperactivity   Shellfish Allergy Hives   Solu-Medrol [Methylprednisolone] Other (See Comments)    Numbness and tingling in mouth   Sulfa Antibiotics Hives and Rash   Sulfasalazine Hives and Rash   Barbiturates Other (See Comments)    Excitement    Bee Pollen Hives   Bee Venom Hives   Bioflavonoid Products     Other reaction(s): Other (See Comments) Nuts, strawberries, bicarbonate of soda, Nuts, strawberries, bicarbonate of soda,   Diovan [Valsartan] Nausea Only   Gabapentin Other (See Comments)    Visual disturbance   Livalo [Pitavastatin] Swelling and Other (See Comments)    Facial swelling   Other Palpitations, Other (See Comments) and Rash    NARCOTIC ANALGESICS-TREMORS, INCREASED HEART RATE Hmg-Coa Reductase Inhibitors - intolerance unknown    Oxycodone Swelling, Rash and Cough   Promethazine-Dm Other (See Comments)    "Felt funny"   Tape Rash and Other (See Comments)    Red blotches    Wellness Essentials Blood Sugr [Nutritional Supplements] Other (See Comments)     Nuts, strawberries, bicarbonate of soda,   Albuterol Other (See Comments)    Caused wheezing   Ciprofloxacin Other (See Comments)   Crestor [Rosuvastatin] Other (See Comments)   Diltiazem Hcl     Other reaction(s): foot sores   Ezetimibe     Other reaction(s): myalgias   Famotidine Other (See Comments)    Unknown reaction   Oxycodone Hcl     Other reaction(s): hives, rash   Pred Forte [Prednisolone Acetate] Other (See Comments)    Patient is seeing odd color and images while using this   Welchol [Colesevelam]     Other reaction(s): myalgias   Celebrex [Celecoxib] Swelling and Other (See Comments)    Swelling and numbness (mouth)   Lanolin Itching   Mobic [Meloxicam] Swelling and Other (See Comments)    Swelling and numbness in mouth   Nickel Rash   Nitrates, Organic Rash and Other (See Comments)  Chest tightness also   Petrolatum Rash and Other (See Comments)   Soy Allergy Rash   Strawberry Extract Rash   Social History   Occupational History   Not on file  Tobacco Use   Smoking status: Never   Smokeless tobacco: Never  Vaping Use   Vaping status: Never Used  Substance and Sexual Activity   Alcohol use: No    Alcohol/week: 0.0 standard drinks of alcohol   Drug use: No   Sexual activity: Never   Family History  Problem Relation Age of Onset   Cancer Father    Allergic rhinitis Neg Hx    Angioedema Neg Hx    Asthma Neg Hx    Atopy Neg Hx    Eczema Neg Hx    Immunodeficiency Neg Hx    Urticaria Neg Hx    Immunization History  Administered Date(s) Administered   PFIZER Comirnaty(Gray Top)Covid-19 Tri-Sucrose Vaccine 07/10/2021   PFIZER(Purple Top)SARS-COV-2 Vaccination 11/28/2020, 12/19/2020   Td 01/29/2019   Td (Adult) 10/16/2009     Review of Systems: Negative except as noted in the HPI.   Objective: There were no vitals filed for this visit.  ROMY WEEBER is a pleasant 82 y.o. female in NAD. AAO X 3.  Title   Diabetic Foot Exam -  detailed Date & Time: 11/05/2023 10:30 AM Diabetic Foot exam was performed with the following findings: Yes  Visual Foot Exam completed.: Yes  Is there a history of foot ulcer?: No Is there a foot ulcer now?: No Is there swelling?: No Is there elevated skin temperature?: No Is there abnormal foot shape?: Yes (Comment: HAV with bunion deformity and hammertoes 2-5 b/l) Is there a claw toe deformity?: No Are the toenails long?: Yes Are the toenails thick?: Yes Are the toenails ingrown?: No Is the skin thin, fragile, shiny and hairless?": No Normal Range of Motion?: No Is there foot or ankle muscle weakness?: No Do you have pain in calf while walking?: No Are the shoes appropriate in style and fit?: No Can the patient see the bottom of their feet?: Yes Pulse Foot Exam completed.: Yes   Right Posterior Tibialis: Present Left posterior Tibialis: Present   Right Dorsalis Pedis: Present Left Dorsalis Pedis: Present     Sensory Foot Exam Completed.: Yes Semmes-Weinstein Monofilament Test "+" means "has sensation" and "-" means "no sensation"  R Foot Test Control: Pos L Foot Test Control: Pos   R Site 1-Great Toe: Pos L Site 1-Great Toe: Pos   R Site 4: Pos L Site 4: Pos   R site 5: Pos L Site 5: Pos  R Site 6: Pos L Site 6: Pos     Image components are not supported.   Image components are not supported. Image components are not supported.  Tuning Fork Right vibratory: present Left vibratory: present  Comments    ADA Risk Categorization: Low Risk :  Patient has all of the following: Intact protective sensation No prior foot ulcer  No severe deformity Pedal pulses present  Assessment: 1. Pain due to onychomycosis of toenails of both feet   2. Hallux valgus with bunions, left   3. Hammertoe of right foot   4. Idiopathic peripheral neuropathy   5. Controlled type 2 diabetes mellitus without complication, without long-term current use of insulin (HCC)   6. Encounter for  diabetic foot exam [facility])     Plan: -Patient's family member present. All questions/concerns addressed on today's visit. -Diabetic foot examination performed today. -  Patient to continue soft, supportive shoe gear daily. -Discussed diabetic shoe benefit available based on patient's diagnoses. Patient/POA would like to proceed. Order entered for one pair extra depth shoes and 3 pair total contact insoles. Patient qualifies based on diagnoses. -Toenails 1-5 b/l were debrided in length and girth with sterile nail nippers and dremel without iatrogenic bleeding.  -Shoe recommendations given for Alegria, [employer] sneakers and Altra sneakers. She will have to try shoes on to see what feels most comfortable to her. Recommend these shoes as it takes a while to get diabetic shoes. She and her daughter related understanding. -Patient/POA to call should there be question/concern in the interim. Return in about 3 months (around 02/05/2024).  Freddie Breech, DPM

## 2023-11-11 ENCOUNTER — Encounter (HOSPITAL_COMMUNITY): Payer: Medicare PPO

## 2023-11-11 ENCOUNTER — Ambulatory Visit: Payer: Medicare PPO | Admitting: Vascular Surgery

## 2023-12-03 ENCOUNTER — Ambulatory Visit: Payer: Medicare PPO

## 2023-12-03 DIAGNOSIS — M2012 Hallux valgus (acquired), left foot: Secondary | ICD-10-CM

## 2023-12-03 DIAGNOSIS — E119 Type 2 diabetes mellitus without complications: Secondary | ICD-10-CM

## 2023-12-03 DIAGNOSIS — M2041 Other hammer toe(s) (acquired), right foot: Secondary | ICD-10-CM

## 2023-12-03 NOTE — Progress Notes (Signed)
Patient presents to the office today for diabetic shoe and insole measuring.  Patient was measured with brannock device to determine size and width for 1 pair of extra depth shoes and foam casted for 3 pair of insoles.   Documentation of medical necessity will be sent to patient's treating diabetic doctor to verify and sign.   Patient's diabetic provider: Laurann Montana    Shoes and insoles will be ordered at that time and patient will be notified for an appointment for fitting when they arrive.  Brannock measurement: 9 Patient shoe selection- Shoe choice:   [id number] / [id number] Shoe size ordered: 9.48M Financials signed   Addison Bailey Cped, CFo, CFm

## 2023-12-16 ENCOUNTER — Other Ambulatory Visit: Payer: Self-pay | Admitting: Family Medicine

## 2023-12-16 DIAGNOSIS — G720 Drug-induced myopathy: Secondary | ICD-10-CM | POA: Diagnosis not present

## 2023-12-16 DIAGNOSIS — E1151 Type 2 diabetes mellitus with diabetic peripheral angiopathy without gangrene: Secondary | ICD-10-CM | POA: Diagnosis not present

## 2023-12-16 DIAGNOSIS — I700 Atherosclerosis of aorta: Secondary | ICD-10-CM | POA: Diagnosis not present

## 2023-12-16 DIAGNOSIS — E785 Hyperlipidemia, unspecified: Secondary | ICD-10-CM | POA: Diagnosis not present

## 2023-12-16 DIAGNOSIS — I6522 Occlusion and stenosis of left carotid artery: Secondary | ICD-10-CM | POA: Diagnosis not present

## 2023-12-16 DIAGNOSIS — I10 Essential (primary) hypertension: Secondary | ICD-10-CM | POA: Diagnosis not present

## 2023-12-16 DIAGNOSIS — M810 Age-related osteoporosis without current pathological fracture: Secondary | ICD-10-CM

## 2023-12-16 DIAGNOSIS — F411 Generalized anxiety disorder: Secondary | ICD-10-CM | POA: Diagnosis not present

## 2023-12-16 DIAGNOSIS — M722 Plantar fascial fibromatosis: Secondary | ICD-10-CM | POA: Diagnosis not present

## 2023-12-16 DIAGNOSIS — E1142 Type 2 diabetes mellitus with diabetic polyneuropathy: Secondary | ICD-10-CM | POA: Diagnosis not present

## 2023-12-19 ENCOUNTER — Ambulatory Visit: Payer: Medicare PPO

## 2023-12-19 NOTE — Progress Notes (Signed)
Shoes were too big patient chose a tie up shoe and we re-ordered will return for fitting when in

## 2024-01-21 ENCOUNTER — Encounter (HOSPITAL_BASED_OUTPATIENT_CLINIC_OR_DEPARTMENT_OTHER): Payer: Self-pay | Admitting: Emergency Medicine

## 2024-01-21 ENCOUNTER — Observation Stay (HOSPITAL_BASED_OUTPATIENT_CLINIC_OR_DEPARTMENT_OTHER)
Admission: EM | Admit: 2024-01-21 | Discharge: 2024-01-24 | Disposition: A | Discharge status: Home / Self Care | Payer: HMO | Attending: Internal Medicine | Admitting: Internal Medicine

## 2024-01-21 ENCOUNTER — Other Ambulatory Visit: Payer: Self-pay

## 2024-01-21 ENCOUNTER — Emergency Department (HOSPITAL_BASED_OUTPATIENT_CLINIC_OR_DEPARTMENT_OTHER): Payer: HMO

## 2024-01-21 DIAGNOSIS — R634 Abnormal weight loss: Secondary | ICD-10-CM | POA: Diagnosis not present

## 2024-01-21 DIAGNOSIS — R002 Palpitations: Principal | ICD-10-CM | POA: Diagnosis present

## 2024-01-21 DIAGNOSIS — M79621 Pain in right upper arm: Secondary | ICD-10-CM | POA: Diagnosis not present

## 2024-01-21 DIAGNOSIS — R009 Unspecified abnormalities of heart beat: Secondary | ICD-10-CM | POA: Diagnosis present

## 2024-01-21 DIAGNOSIS — F419 Anxiety disorder, unspecified: Secondary | ICD-10-CM | POA: Diagnosis present

## 2024-01-21 DIAGNOSIS — Z79899 Other long term (current) drug therapy: Secondary | ICD-10-CM | POA: Diagnosis not present

## 2024-01-21 DIAGNOSIS — R42 Dizziness and giddiness: Secondary | ICD-10-CM | POA: Diagnosis not present

## 2024-01-21 DIAGNOSIS — R531 Weakness: Secondary | ICD-10-CM | POA: Diagnosis not present

## 2024-01-21 DIAGNOSIS — R7989 Other specified abnormal findings of blood chemistry: Secondary | ICD-10-CM | POA: Diagnosis present

## 2024-01-21 DIAGNOSIS — R001 Bradycardia, unspecified: Secondary | ICD-10-CM | POA: Insufficient documentation

## 2024-01-21 DIAGNOSIS — D696 Thrombocytopenia, unspecified: Secondary | ICD-10-CM | POA: Diagnosis present

## 2024-01-21 DIAGNOSIS — D72819 Decreased white blood cell count, unspecified: Secondary | ICD-10-CM | POA: Diagnosis present

## 2024-01-21 DIAGNOSIS — R000 Tachycardia, unspecified: Principal | ICD-10-CM | POA: Insufficient documentation

## 2024-01-21 DIAGNOSIS — Z91040 Latex allergy status: Secondary | ICD-10-CM | POA: Diagnosis not present

## 2024-01-21 DIAGNOSIS — Z7901 Long term (current) use of anticoagulants: Secondary | ICD-10-CM | POA: Insufficient documentation

## 2024-01-21 DIAGNOSIS — I739 Peripheral vascular disease, unspecified: Secondary | ICD-10-CM | POA: Diagnosis present

## 2024-01-21 DIAGNOSIS — I5189 Other ill-defined heart diseases: Secondary | ICD-10-CM

## 2024-01-21 DIAGNOSIS — I119 Hypertensive heart disease without heart failure: Secondary | ICD-10-CM | POA: Diagnosis not present

## 2024-01-21 DIAGNOSIS — M79601 Pain in right arm: Secondary | ICD-10-CM

## 2024-01-21 DIAGNOSIS — R791 Abnormal coagulation profile: Secondary | ICD-10-CM | POA: Insufficient documentation

## 2024-01-21 LAB — CBC WITH DIFFERENTIAL/PLATELET
Abs Immature Granulocytes: 0 10*3/uL (ref 0.00–0.07)
Basophils Absolute: 0 10*3/uL (ref 0.0–0.1)
Basophils Relative: 1 %
Eosinophils Absolute: 0 10*3/uL (ref 0.0–0.5)
Eosinophils Relative: 0 %
HCT: 39.3 % (ref 36.0–46.0)
Hemoglobin: 12 g/dL (ref 12.0–15.0)
Immature Granulocytes: 0 %
Lymphocytes Relative: 26 %
Lymphs Abs: 0.8 10*3/uL (ref 0.7–4.0)
MCH: 22.6 pg — ABNORMAL LOW (ref 26.0–34.0)
MCHC: 30.5 g/dL (ref 30.0–36.0)
MCV: 73.9 fL — ABNORMAL LOW (ref 80.0–100.0)
Monocytes Absolute: 0.3 10*3/uL (ref 0.1–1.0)
Monocytes Relative: 9 %
Neutro Abs: 2 10*3/uL (ref 1.7–7.7)
Neutrophils Relative %: 64 %
Platelets: 111 10*3/uL — ABNORMAL LOW (ref 150–400)
RBC: 5.32 MIL/uL — ABNORMAL HIGH (ref 3.87–5.11)
RDW: 14.9 % (ref 11.5–15.5)
WBC: 3.2 10*3/uL — ABNORMAL LOW (ref 4.0–10.5)
nRBC: 0 % (ref 0.0–0.2)

## 2024-01-21 LAB — COMPREHENSIVE METABOLIC PANEL
ALT: 12 U/L (ref 0–44)
AST: 25 U/L (ref 15–41)
Albumin: 4.6 g/dL (ref 3.5–5.0)
Alkaline Phosphatase: 98 U/L (ref 38–126)
Anion gap: 14 (ref 5–15)
BUN: 12 mg/dL (ref 8–23)
CO2: 23 mmol/L (ref 22–32)
Calcium: 9.9 mg/dL (ref 8.9–10.3)
Chloride: 102 mmol/L (ref 98–111)
Creatinine, Ser: 0.96 mg/dL (ref 0.44–1.00)
GFR, Estimated: 59 mL/min — ABNORMAL LOW (ref >60)
Glucose, Bld: 91 mg/dL (ref 70–99)
Potassium: 4.1 mmol/L (ref 3.5–5.1)
Sodium: 139 mmol/L (ref 135–145)
Total Bilirubin: 0.7 mg/dL (ref 0.0–1.2)
Total Protein: 7 g/dL (ref 6.5–8.1)

## 2024-01-21 LAB — TROPONIN I (HIGH SENSITIVITY)
Troponin I (High Sensitivity): 4 ng/L (ref <18)
Troponin I (High Sensitivity): 6 ng/L (ref <18)

## 2024-01-21 LAB — D-DIMER, QUANTITATIVE: D-Dimer, Quant: 1.07 ug{FEU}/mL — ABNORMAL HIGH (ref 0.00–0.50)

## 2024-01-21 NOTE — ED Notes (Signed)
Pt given dinner  

## 2024-01-21 NOTE — ED Provider Notes (Addendum)
[place] [facility] DEPARTMENT AT [facility] Provider Note   CSN: [id number] Arrival date & time: 01/21/24  1544     History  Chief Complaint  Patient presents with   Irregular Heart Beat    Donna Baldwin is a 83 y.o. female.  Patient reports that she has been having problems with her heart rate being elevated for the past week.  Patient reports that her Fitbit shows her heart rate being high to the 120 range and then dropping to the 40s.  Patient complains of feeling weak all over.  Patient complains of pain in the center of her chest.  Patient reports she has also had some discomfort in her right arm.  Patient reports her right arm feels tight.  Patient reports her heart rate being elevated wakes her up at night.  Patient states that when her heart rate goes up she feels very dizzy and feels like she is going to pass out.  Patient reports she has had vertigo in the past and feels like she has vertigo when her heart rate goes up.  The history is provided by the patient. No language interpreter was used.       Home Medications Prior to Admission medications   Medication Sig Start Date End Date Taking? Authorizing Provider  aspirin EC 81 MG tablet Take 81 mg by mouth as needed. 06/27/23   [provider]  Calcium Carbonate Antacid (TUMS CHEWY BITES PO) Take 1 tablet by mouth daily as needed (acid reflux).    [provider]  cetirizine (ZYRTEC) 10 MG tablet Take 10 mg by mouth daily as needed for allergies.    [provider]  Cyanocobalamin (VITAMIN B-12 PO) Take 2,500 mcg by mouth daily.    [provider]  EPINEPHrine (EPIPEN 2-PAK) 0.3 mg/0.3 mL IJ SOAJ injection Inject 0.3 mg into the muscle as needed for anaphylaxis. 11/13/22   Marcelyn Bruins, MD  Evolocumab (REPATHA SURECLICK) 140 MG/ML SOAJ Inject 140 mg into the skin every 14 (fourteen) days. 09/11/23   Yates Decamp, MD  hydrOXYzine (ATARAX) 10 MG tablet Take 1  tablet (10 mg total) by mouth daily. 08/28/23   Yates Decamp, MD  LORazepam (ATIVAN) 0.5 MG tablet Take 0.5 mg by mouth daily as needed for anxiety.    [provider]  mometasone (ELOCON) 0.1 % cream Apply 1 application topically daily.    [provider]  NIFEdipine (ADALAT CC) 30 MG 24 hr tablet TAKE 1 TABLET BY MOUTH EVERY DAY 05/30/23   Yates Decamp, MD  triamcinolone cream (KENALOG) 0.1 % Apply 1 Application topically 2 (two) times daily. 12/16/22   [provider]  Vitamin D, Ergocalciferol, (DRISDOL) 1.25 MG (50000 UNIT) CAPS capsule Take 50,000 Units by mouth every 7 (seven) days. 06/18/23   [provider]      Allergies    Beef allergy; Beef-derived drug products; Codeine; Iodine; Iodine-131; Ivp dye [iodinated contrast media]; Latex; Morphine and codeine; Penicillins; Sertraline; Shellfish allergy; Solu-medrol [methylprednisolone]; Sulfa antibiotics; Sulfasalazine; Barbiturates; Bee pollen; Bee venom; Bioflavonoid products; Diovan [valsartan]; Gabapentin; Livalo [pitavastatin]; Other; Oxycodone; Promethazine-dm; Tape; Wellness essentials blood sugr [nutritional supplements]; Albuterol; Ciprofloxacin; Crestor [rosuvastatin]; Diltiazem hcl; Ezetimibe; Famotidine; Oxycodone hcl; Pred forte [prednisolone acetate]; Welchol [colesevelam]; Celebrex [celecoxib]; Lanolin; Mobic [meloxicam]; Nickel; Nitrates, organic; Petrolatum; Soy allergy (do not select); and Strawberry extract    Review of Systems   Review of Systems  All other systems reviewed and are negative.   Physical Exam Updated Vital Signs  BP 137/76   Pulse (!) 55   Temp 98.1 F (36.7 C)   Resp 18   Wt 49 kg   SpO2 100%   BMI 17.97 kg/m  Physical Exam Vitals reviewed.  HENT:     Nose: Nose normal.     Mouth/Throat:     Mouth: Mucous membranes are moist.  Cardiovascular:     Rate and Rhythm: Tachycardia present.  Pulmonary:     Effort: Pulmonary effort is normal.     Breath sounds: Normal  breath sounds.  Abdominal:     General: Abdomen is flat.  Musculoskeletal:        General: Normal range of motion.     Cervical back: Normal range of motion.  Skin:    General: Skin is warm.  Neurological:     General: No focal deficit present.     Mental Status: She is alert.     ED Results / Procedures / Treatments   Labs (all labs ordered are listed, but only abnormal results are displayed) Labs Reviewed  CBC WITH DIFFERENTIAL/PLATELET - Abnormal; Notable for the following components:      Result Value   WBC 3.2 (*)    RBC 5.32 (*)    MCV 73.9 (*)    MCH 22.6 (*)    Platelets 111 (*)    All other components within normal limits  COMPREHENSIVE METABOLIC PANEL - Abnormal; Notable for the following components:   GFR, Estimated 59 (*)    All other components within normal limits  D-DIMER, QUANTITATIVE - Abnormal; Notable for the following components:   D-Dimer, Quant 1.07 (*)    All other components within normal limits  TROPONIN I (HIGH SENSITIVITY)  TROPONIN I (HIGH SENSITIVITY)    EKG None  Radiology DG Chest Port 1 View Result Date: 01/21/2024 CLINICAL DATA:  Chest pain and tachycardia. EXAM: PORTABLE CHEST 1 VIEW COMPARISON:  Chest radiograph dated 08/05/2022. FINDINGS: No focal consolidation, pleural effusion, or pneumothorax. Stable cardiac silhouette. Atherosclerotic calcification of the aortic arch. No acute osseous pathology. IMPRESSION: No active disease. Electronically Signed   By: Elgie Collard M.D.   On: 01/21/2024 16:20    Procedures Procedures    Medications Ordered in ED Medications - No data to display  ED Course/ Medical Decision Making/ A&P                                 Medical Decision Making Patient complains of episodes of her heart racing and feeling like she is going to pass out.  Patient reports that she noticed her heart beating fast 4 months ago.  Patient reports that over the past week she has began feeling very dizzy and very  weak when her heart starts racing.  Patient states that if she is walking she feels dizzy and feels like she is going to pass out.  Patient states that she has a Fitbit that shows where her heart is beating up to 820 beats a minute.  Patient also has periods of time when her heart rate drops low.  Patient shows me where her heart drops to the 40s and 50s on her Fitbit.  Amount and/or Complexity of Data Reviewed Independent Historian: spouse    Details: Patient is here with spouse who is supportive. Labs: ordered. Decision-making details documented in ED Course.    Details: Labs ordered reviewed and interpreted troponin is negative D-dimer is 1.07. Radiology: ordered  and independent interpretation performed. Decision-making details documented in ED Course.    Details: Chest x-ray shows no acute changes ECG/medicine tests: ordered and independent interpretation performed. Decision-making details documented in ED Course.    Details: EKG shows sinus tachycardia at 118 Discussion of management or test interpretation with external provider(s): Hospitalist consulted for admission. Dr. Marilynn Rail will admit  Risk Decision regarding hospitalization.           Final Clinical Impression(s) / ED Diagnoses Final diagnoses:  Tachycardia  Bradycardia  Dizziness  Weakness  Pain of right upper extremity  Elevated d-dimer    Rx / DC Orders ED Discharge Orders     None         Osie Cheeks 01/21/24 1915    Elson Areas, PA-C 01/21/24 1935    Royanne Foots, DO 01/26/24 1143

## 2024-01-21 NOTE — Progress Notes (Signed)
Hospitalist Transfer Note:    Nursing staff, Please call [facility] Admits & Consults System-Wide number on Amion [phone]) as soon as patient's arrival, so appropriate admitting provider can evaluate the pt.   Transferring facility: [facility] Requesting provider: Langston Masker, PA (EDP at [facility]) Reason for transfer: admission for further evaluation and management of chest pain.    64 F w/ h/o HTN, ITP, who presented to [facility] ED complaining of 1 week of intermittent substernal chest discomfort associated with palpitations, dizziness, presyncope.  She wears a Fitbit, which she notes shows tachycardia with heart rates into the 120s at the time of the symptoms.  She notes that the symptoms occur at times with ambulation, improving with rest, but also notes that she has experienced similar symptoms at rest.  In reviewing her Fitbit, she also notes that she is having heart rates in the 40s to 50s and conveys generalized weakness and fatigue over the course of the last week.  No known history of atrial fibrillation or sick sinus syndrome.  Not on any AV nodal blocking agents at home.  She had 1 similar episode of chest discomfort at [facility] this evening.  No associated any shortness of breath.  Of note, she follows with Dr. Gillermina Hu as her outpatient cardiologist.  At [facility] today, heart rate has mainly been tachycardic, with heart rates 1 teens to 120s, which appears to be sinus on the monitor, although been noted to decrease in the 50s at times.   EKG is reported to show sinus tachycardia with heart rate 118, no evidence of acute ischemic changes, including no evidence of ST elevation.  Initial troponin 4, with repeat value noted to be 6.  D-dimer elevated at 1.07.  However, EDP conveys that the patient has a history of anaphylactic response to IV dye preventing pursuit of CTA chest.   Chest x-ray shows no evidence of acute cardiopulmonary process.  Subsequently, I accepted this patient for transfer for  observation to a cardiac telemetry bed at [facility] for further work-up and management of the above.      [name], DO Hospitalist

## 2024-01-21 NOTE — ED Notes (Signed)
Pt called out stating she was having palpitations and SHOB. Pt noted to has HR in 130's. EKG captured and MD made aware. Pt denies any chest pain.

## 2024-01-21 NOTE — ED Triage Notes (Signed)
Pt c/o "heart beating fast"  x 1 week/ with mid-sternal CP. Also reports RT arm pain today. 81 mg ASA at 1300

## 2024-01-22 ENCOUNTER — Observation Stay (HOSPITAL_COMMUNITY): Payer: HMO

## 2024-01-22 DIAGNOSIS — F419 Anxiety disorder, unspecified: Secondary | ICD-10-CM | POA: Diagnosis not present

## 2024-01-22 DIAGNOSIS — R000 Tachycardia, unspecified: Secondary | ICD-10-CM | POA: Diagnosis not present

## 2024-01-22 DIAGNOSIS — D696 Thrombocytopenia, unspecified: Secondary | ICD-10-CM

## 2024-01-22 DIAGNOSIS — I739 Peripheral vascular disease, unspecified: Secondary | ICD-10-CM

## 2024-01-22 DIAGNOSIS — R42 Dizziness and giddiness: Secondary | ICD-10-CM | POA: Diagnosis not present

## 2024-01-22 DIAGNOSIS — Z91040 Latex allergy status: Secondary | ICD-10-CM | POA: Diagnosis not present

## 2024-01-22 DIAGNOSIS — R7989 Other specified abnormal findings of blood chemistry: Secondary | ICD-10-CM | POA: Diagnosis not present

## 2024-01-22 DIAGNOSIS — R9431 Abnormal electrocardiogram [ECG] [EKG]: Secondary | ICD-10-CM | POA: Diagnosis not present

## 2024-01-22 DIAGNOSIS — D72819 Decreased white blood cell count, unspecified: Secondary | ICD-10-CM | POA: Diagnosis present

## 2024-01-22 DIAGNOSIS — I119 Hypertensive heart disease without heart failure: Secondary | ICD-10-CM | POA: Diagnosis not present

## 2024-01-22 DIAGNOSIS — R001 Bradycardia, unspecified: Secondary | ICD-10-CM | POA: Diagnosis not present

## 2024-01-22 DIAGNOSIS — R531 Weakness: Secondary | ICD-10-CM | POA: Diagnosis not present

## 2024-01-22 DIAGNOSIS — I5189 Other ill-defined heart diseases: Secondary | ICD-10-CM | POA: Diagnosis not present

## 2024-01-22 DIAGNOSIS — R634 Abnormal weight loss: Secondary | ICD-10-CM | POA: Diagnosis not present

## 2024-01-22 DIAGNOSIS — Z7901 Long term (current) use of anticoagulants: Secondary | ICD-10-CM | POA: Diagnosis not present

## 2024-01-22 DIAGNOSIS — Z79899 Other long term (current) drug therapy: Secondary | ICD-10-CM | POA: Diagnosis not present

## 2024-01-22 DIAGNOSIS — M79621 Pain in right upper arm: Secondary | ICD-10-CM | POA: Diagnosis not present

## 2024-01-22 DIAGNOSIS — R002 Palpitations: Principal | ICD-10-CM

## 2024-01-22 DIAGNOSIS — R791 Abnormal coagulation profile: Secondary | ICD-10-CM | POA: Diagnosis not present

## 2024-01-22 DIAGNOSIS — R009 Unspecified abnormalities of heart beat: Secondary | ICD-10-CM | POA: Diagnosis present

## 2024-01-22 LAB — ECHOCARDIOGRAM COMPLETE
AR max vel: 2.48 cm2
AV Area VTI: 2.2 cm2
AV Area mean vel: 2.03 cm2
AV Mean grad: 3 mm[Hg]
AV Peak grad: 5.5 mm[Hg]
Ao pk vel: 1.17 m/s
Area-P 1/2: 3.77 cm2
Height: 65 in
S' Lateral: 2.8 cm
Weight: 1728.41 [oz_av]

## 2024-01-22 LAB — TSH: TSH: 2.118 u[IU]/mL (ref 0.350–4.500)

## 2024-01-22 MED ORDER — SODIUM CHLORIDE 0.9 % IV BOLUS: 500.0000 mL | Freq: Once | INTRAVENOUS | Status: DC

## 2024-01-22 MED ORDER — ACETAMINOPHEN 325 MG PO TABS: 650.0000 mg | ORAL_TABLET | Freq: Four times a day (QID) | ORAL | Status: DC | PRN

## 2024-01-22 MED ORDER — AMLODIPINE BESYLATE 5 MG PO TABS
5.0000 mg | ORAL_TABLET | Freq: Every day | ORAL | Status: DC
  Administered 2024-01-22 – 2024-01-24 (×3): 5 mg via ORAL
  Filled 2024-01-22 (×3): qty 1

## 2024-01-22 MED ORDER — TECHNETIUM TO 99M ALBUMIN AGGREGATED
4.0000 | Freq: Once | INTRAVENOUS | Status: AC | PRN
  Administered 2024-01-22: 4 via INTRAVENOUS

## 2024-01-22 MED ORDER — ENOXAPARIN SODIUM 40 MG/0.4ML IJ SOSY
40.0000 mg | PREFILLED_SYRINGE | INTRAMUSCULAR | Status: DC
Start: 2024-01-22 — End: 2024-01-24
  Administered 2024-01-22 – 2024-01-24 (×3): 40 mg via SUBCUTANEOUS
  Filled 2024-01-22 (×3): qty 0.4

## 2024-01-22 MED ORDER — ACETAMINOPHEN 650 MG RE SUPP: 650.0000 mg | Freq: Four times a day (QID) | RECTAL | Status: DC | PRN

## 2024-01-22 MED ORDER — ONDANSETRON HCL 4 MG/2ML IJ SOLN: 4.0000 mg | Freq: Four times a day (QID) | INTRAMUSCULAR | Status: DC | PRN

## 2024-01-22 MED ORDER — ONDANSETRON HCL 4 MG PO TABS: 4.0000 mg | ORAL_TABLET | Freq: Four times a day (QID) | ORAL | Status: DC | PRN

## 2024-01-22 MED ORDER — SODIUM CHLORIDE 0.9% FLUSH
3.0000 mL | Freq: Two times a day (BID) | INTRAVENOUS | Status: DC
  Administered 2024-01-22 – 2024-01-24 (×5): 3 mL via INTRAVENOUS

## 2024-01-22 NOTE — H&P (Addendum)
History and Physical    Patient: Donna Baldwin ZOX:[id number] DOB: May 20, 1941 DOA: 01/21/2024 DOS: the patient was seen and examined on 01/22/2024 PCP: Laurann Montana, MD  Patient coming from: Transfer from [facility]  Chief Complaint:  Chief Complaint  Patient presents with   Irregular Heart Beat   HPI: Donna Baldwin is a 83 y.o. female with medical history significant of hypertension, hyperlipidemia anxiety, anemia, and GERD presented to the hospital due to erratic heart palpitations. She described her heartbeats as fluctuating rapidly, ranging from 39 to 130 beats per minute. The patient reported a sensation of blood pooling in her legs and feet, followed by a rapid heartbeat, which she perceived as an attempt to move the blood. This was accompanied by a spike in blood pressure and heart rate, which would then drop back down.  Two days prior to admission, the patient experienced an episode of vertigo, describing it as a sensation of the room spinning around her. The following day, she began experiencing the aforementioned heart palpitations. The patient also reported an increased need to urinate during these episodes.  In addition to the palpitations, the patient described experiencing cramps in her legs, which she referred to as "charley horses." She also noted a hardening of the muscles in her right arm, which had been ongoing for two days at the time of the consultation.  The patient reported a weight loss of two pounds, from 108 to 106 pounds. She also mentioned that her blood was clotting very quickly, to the point where it was difficult to draw blood for testing.  The patient had not sought medical attention for these symptoms prior to this hospital admission. She was scheduled to see her cardiologist Dr. Jacinto Halim on the third of February.  At [facility] patient was noted to be afebrile with heart rates ranging from 45-1 33, respirations 12-33, blood pressures 113/77 187/85,  and O2 saturations maintained on room air. Labs noted WBC 3.2, MCV 73.9, MCH 22.6 platelets 111, D-dimer elevated 1.07, high-sensitivity troponins negative x 2.  CT angiogram of the chest was to be obtained, but patient noted to have anaphylaxis to contrast dye.  Past Medical History:  Diagnosis Date   Acid reflux    Anemia    Anxiety    Back pain    GERD (gastroesophageal reflux disease)    Hyperlipidemia    Hypertension    Hypertensive heart disease without heart failure    Thrombocytopenia (HCC)    Vitamin D deficiency    Past Surgical History:  Procedure Laterality Date   ESOPHAGEAL MANOMETRY N/A 03/15/2020   Procedure: ESOPHAGEAL MANOMETRY (EM);  Surgeon: Charlott Rakes, MD;  Location: [facility] ENDOSCOPY;  Service: Endoscopy;  Laterality: N/A;   EYE SURGERY     HELLER MYOTOMY N/A 04/18/2020   Procedure: LAPAROSCOPIC HELLER MYOTOMY WITH UPPER ENDOSCOPY;  Surgeon: Kinsinger, De Blanch, MD;  Location: [facility] ORS;  Service: General;  Laterality: N/A;   PH IMPEDANCE STUDY N/A 03/15/2020   Procedure: PH IMPEDANCE STUDY;  Surgeon: Charlott Rakes, MD;  Location: [facility] ENDOSCOPY;  Service: Endoscopy;  Laterality: N/A;   SHOULDER SURGERY Bilateral    TONSILLECTOMY     Social History:  reports that she has never smoked. She has never used smokeless tobacco. She reports that she does not drink alcohol and does not use drugs.  Allergies  Allergen Reactions   Beef Allergy Anaphylaxis   Beef-Derived Drug Products Anaphylaxis   Codeine Hives and Anxiety   Iodine Anaphylaxis   Iodine-131 Anaphylaxis  Ivp Dye [Iodinated Contrast Media] Anaphylaxis and Other (See Comments)   Latex Rash   Morphine And Codeine Other (See Comments)    Tremors, increased heart rate, excitement, confusion per pt   Penicillins Hives    Has patient had a PCN reaction causing immediate rash, facial/tongue/throat swelling, SOB or lightheadedness with hypotension: Yes Has patient had a PCN reaction causing severe rash  involving mucus membranes or skin necrosis: Yes Has patient had a PCN reaction that required hospitalization: Yes Has patient had a PCN reaction occurring within the last 10 years: No If all of the above answers are "NO", then may proceed with Cephalosporin use.    Sertraline Other (See Comments)    Numbness in mouth and hyperactivity   Shellfish Allergy Hives   Solu-Medrol [Methylprednisolone] Other (See Comments)    Numbness and tingling in mouth   Sulfa Antibiotics Hives and Rash   Sulfasalazine Hives and Rash   Barbiturates Other (See Comments)    Excitement    Bee Pollen Hives   Bee Venom Hives   Bioflavonoid Products     Other reaction(s): Other (See Comments) Nuts, strawberries, bicarbonate of soda, Nuts, strawberries, bicarbonate of soda,   Diovan [Valsartan] Nausea Only   Gabapentin Other (See Comments)    Visual disturbance   Livalo [Pitavastatin] Swelling and Other (See Comments)    Facial swelling   Other Palpitations, Other (See Comments) and Rash    NARCOTIC ANALGESICS-TREMORS, INCREASED HEART RATE Hmg-Coa Reductase Inhibitors - intolerance unknown    Oxycodone Swelling, Rash and Cough   Promethazine-Dm Other (See Comments)    "Felt funny"   Tape Rash and Other (See Comments)    Red blotches    Wellness Essentials Blood Sugr [Nutritional Supplements] Other (See Comments)    Nuts, strawberries, bicarbonate of soda,   Albuterol Other (See Comments)    Caused wheezing   Ciprofloxacin Other (See Comments)   Crestor [Rosuvastatin] Other (See Comments)   Diltiazem Hcl     Other reaction(s): foot sores   Ezetimibe     Other reaction(s): myalgias   Famotidine Other (See Comments)    Unknown reaction   Oxycodone Hcl     Other reaction(s): hives, rash   Pred Forte [Prednisolone Acetate] Other (See Comments)    Patient is seeing odd color and images while using this   Welchol [Colesevelam]     Other reaction(s): myalgias   Celebrex [Celecoxib] Swelling and  Other (See Comments)    Swelling and numbness (mouth)   Lanolin Itching   Mobic [Meloxicam] Swelling and Other (See Comments)    Swelling and numbness in mouth   Nickel Rash   Nitrates, Organic Rash and Other (See Comments)    Chest tightness also   Petrolatum Rash and Other (See Comments)   Soy Allergy (Do Not Select) Rash   Strawberry Extract Rash    Family History  Problem Relation Age of Onset   Cancer Father    Allergic rhinitis Neg Hx    Angioedema Neg Hx    Asthma Neg Hx    Atopy Neg Hx    Eczema Neg Hx    Immunodeficiency Neg Hx    Urticaria Neg Hx     Prior to Admission medications   Medication Sig Start Date End Date Taking? Authorizing Provider  aspirin EC 81 MG tablet Take 81 mg by mouth as needed. 06/27/23   [provider]  Calcium Carbonate Antacid (TUMS CHEWY BITES PO) Take 1 tablet by mouth daily as  needed (acid reflux).    [provider]  cetirizine (ZYRTEC) 10 MG tablet Take 10 mg by mouth daily as needed for allergies.    [provider]  Cyanocobalamin (VITAMIN B-12 PO) Take 2,500 mcg by mouth daily.    [provider]  EPINEPHrine (EPIPEN 2-PAK) 0.3 mg/0.3 mL IJ SOAJ injection Inject 0.3 mg into the muscle as needed for anaphylaxis. 11/13/22   Marcelyn Bruins, MD  Evolocumab (REPATHA SURECLICK) 140 MG/ML SOAJ Inject 140 mg into the skin every 14 (fourteen) days. 09/11/23   Yates Decamp, MD  hydrOXYzine (ATARAX) 10 MG tablet Take 1 tablet (10 mg total) by mouth daily. 08/28/23   Yates Decamp, MD  LORazepam (ATIVAN) 0.5 MG tablet Take 0.5 mg by mouth daily as needed for anxiety.    [provider]  mometasone (ELOCON) 0.1 % cream Apply 1 application topically daily.    [provider]  NIFEdipine (ADALAT CC) 30 MG 24 hr tablet TAKE 1 TABLET BY MOUTH EVERY DAY 05/30/23   Yates Decamp, MD  triamcinolone cream (KENALOG) 0.1 % Apply 1 Application topically 2 (two) times daily. 12/16/22   [provider]  Vitamin D, Ergocalciferol, (DRISDOL) 1.25 MG (50000 UNIT) CAPS capsule Take 50,000 Units by mouth every 7 (seven) days. 06/18/23   [provider]    Physical Exam: Vitals:   01/22/24 0500 01/22/24 0515 01/22/24 0600 01/22/24 0700  BP: 136/76  137/71 138/64  Pulse: (!) 56 60 (!) 50 (!) 53  Resp: 18 19 14 12   Temp:      TempSrc:      SpO2: 100% 100% 100% 100%  Weight:      Height:       Constitutional: Frail elderly female currently no acute distress Eyes: PERRL, lids and conjunctivae normal ENMT: Mucous membranes are moist. Posterior pharynx clear of any exudate or lesions.Normal dentition.  Neck: normal, supple  Respiratory: clear to auscultation bilaterally, no wheezing, no crackles.  No accessory muscle use.  Cardiovascular: Tachycardic. No extremity edema. 2+ pedal pulses.   Abdomen: no tenderness, no masses palpated. Bowel sounds positive.  Musculoskeletal: no clubbing / cyanosis. No joint deformity upper and lower extremities. Good ROM, no contractures.  Increased muscle tone noted of the right bicep. Skin: no rashes, lesions, ulcers. No induration Neurologic: CN 2-12 grossly intact. Strength 5/5 in all 4.  Psychiatric: Normal judgment and insight. Alert and oriented x 3. Normal mood.   Data Reviewed:  EKG revealed sinus tachycardia 136 and subsequently bradycardia at a reviewed labs, imaging, and pertinent records as documented  Assessment and Plan:  Palpitations Acute.  Patient presents with reports of heart rates ranging anywhere from 39-130s since 1/17.  During episodes patient reports feeling lightheaded as though she could pass out.  High-sensitivity troponins were negative x 2.  Patient previously wore a Holter monitor back in 2022 which noted 2 episodes of atrial tachycardia with longest lasting 12.6 seconds, and single sinus pause lasting 3.1 seconds.  Medication regimen currently includes includes ninifedipine 30 mg daily. -Admit to a cardiac telemetry  bed -Check echocardiogram -Check TSH -Follow-up telemetry -Nifedipine stopped and started on amlodipine -Cardiology to arrange outpatient telemetry monitoring -Cardiology consulted,  will follow-up for any further recommendations   Elevated D-dimer Acute.  D-dimer 1.07.  Patient reports history of anaphylaxis with IV contrast dye. -Checking VQ scan  Diastolic dysfunction Patient does not appear to be acutely fluid overloaded at this time.  Last echocardiogram noted EF to be approximately  54% with grade 2 diastolic dysfunction when checked back in 06/2021. -Follow-up echo  Peripheral vascular disease Patient reports having a feeling of blood pooling in her legs.  Previous ultrasound from 05/12/2023 noted resting ABI on the right in the moderate range arterial occlusive disease with segmental exam demonstrating SFA occlusion.  Left leg noted resting ABI in the mild range with segmental exam demonstrating popliteal artery/tibial artery occlusive disease.  Previous allergies include pitavastatin which caused facial swelling. -Check ABI  Leukopenia Chronic.  WBC 3.2 and similar to previous.  Patient otherwise noted to be afebrile. -Continue to monitor.  May warrant outpatient hematology oncology for further workup but differentials appear to be stable  Thrombocytopenia Chronic.  Platelet count 111 which appears similar to prior. -Continue to monitor  Anxiety -Continue Ativan as needed  Weight loss Patient reports normally when around 110 pounds but recently had noted down to 106. -Follow-up TSH  DVT prophylaxis: Lovenox Advance Care Planning:   Code Status: Full Code    Consults: Cardiology  Family Communication: Husband updated at bedside  Severity of Illness: The appropriate patient status for this patient is OBSERVATION. Observation status is judged to be reasonable and necessary in order to provide the required intensity of service to ensure the patient's safety. The patient's  presenting symptoms, physical exam findings, and initial radiographic and laboratory data in the context of their medical condition is felt to place them at decreased risk for further clinical deterioration. Furthermore, it is anticipated that the patient will be medically stable for discharge from the hospital within 2 midnights of admission.   Author: Clydie Braun, MD 01/22/2024 11:27 AM  For on call review [email or web address].

## 2024-01-22 NOTE — ED Notes (Signed)
Called [facility] at [facility] for transport: 08:39

## 2024-01-22 NOTE — TOC Initial Note (Signed)
Transition of Care [facility]) - Initial/Assessment Note    Patient Details  Name: Donna Baldwin MRN: [id number] Date of Birth: Nov 26, 1941  Transition of Care [facility]) CM/SW Contact:    Leone Haven, RN Phone Number: 01/22/2024, 4:09 PM  Clinical Narrative:                 From home with spouse, has PCP and insurance on file, states has no HH services in place at this time or DME at home.  States family member will transport them home at [employer] and family is support system, states [facility] [address].  Pta self ambulatory.   Expected Discharge Plan: Home/Self Care Barriers to Discharge: Continued Medical Work up   Patient Goals and CMS Choice Patient states their goals for this hospitalization and ongoing recovery are:: return home   Choice offered to / list presented to : NA      Expected Discharge Plan and Services In-house Referral: NA Discharge Planning Services: CM Consult Post Acute Care Choice: NA Living arrangements for the past 2 months: Single Family Home                 DME Arranged: N/A DME Agency: NA       HH Arranged: NA          Prior Living Arrangements/Services Living arrangements for the past 2 months: Single Family Home Lives with:: Spouse Patient language and need for interpreter reviewed:: Yes Do you feel safe going back to the place where you live?: Yes      Need for Family Participation in Patient Care: Yes (Comment) Care giver support system in place?: Yes (comment)   Criminal Activity/Legal Involvement Pertinent to Current Situation/Hospitalization: No - Comment as needed  Activities of Daily Living   ADL Screening (condition at time of admission) Independently performs ADLs?: Yes (appropriate for developmental age) Is the patient deaf or have difficulty hearing?: No Does the patient have difficulty seeing, even when wearing glasses/contacts?: No Does the patient have difficulty concentrating, remembering, or  making decisions?: No  Permission Sought/Granted Permission sought to share information with : Case Manager Permission granted to share information with : Yes, Verbal Permission Granted              Emotional Assessment Appearance:: Appears stated age Attitude/Demeanor/Rapport: Engaged Affect (typically observed): Appropriate Orientation: : Oriented to Self, Oriented to Place, Oriented to  Time, Oriented to Situation Alcohol / Substance Use: Not Applicable Psych Involvement: No (comment)  Admission diagnosis:  Palpitations [R00.2] Dizziness [R42] Bradycardia [R00.1] Weakness [R53.1] Tachycardia [R00.0] Elevated d-dimer [R79.89] Pain of right upper extremity [M79.601] Patient Active Problem List   Diagnosis Date Noted   Dysphagia 04/25/2023   Chest pain 08/06/2022   Bradycardia 08/05/2022   Circadian rhythm sleep disorder, advanced sleep phase type 08/07/2021   Bradycardia with 31-40 beats per minute 08/07/2021   Primary hypertension 08/07/2021   Snoring 08/07/2021   Excessive daytime sleepiness 08/07/2021   Palpitations 08/07/2021   Microcytic anemia 04/11/2021   Achalasia 04/18/2020   Abnormal CT of liver 12/20/2019   Coronary artery disease involving native coronary artery of native heart without angina pectoris 12/20/2019   Palpitation 02/08/2019   Interstitial cystitis 08/25/2018   Nonintractable headache    TIA (transient ischemic attack) 07/06/2018   History of diabetes mellitus 04/01/2017   Transient retinal artery occlusion of both eyes 07/07/2014   Hereditary and idiopathic peripheral neuropathy 07/07/2014   Aortic atherosclerosis (HCC) 12/03/2013   Mixed  hyperlipidemia 12/03/2013   GERD (gastroesophageal reflux disease) 03/24/2013   Obstructive sleep apnea, ruled out 02/28/2013   Urticaria due to food allergy 08/31/2012   Thrombocytopenia (HCC)    PCP:  Laurann Montana, MD Pharmacy:   [id number] [place], [place] - [address] [address] Phone: [phone] Fax: [phone]  [facility] ([facility]) - [address] [place], [place] - [address] [place] [address] Phone: [phone] Fax: [phone]     Social Drivers of Health (SDOH) Social History: SDOH Screenings   Food Insecurity: No Food Insecurity (01/22/2024)  Housing: Low Risk  (01/22/2024)  Transportation Needs: No Transportation Needs (01/22/2024)  Utilities: Not At Risk (01/22/2024)  Social Connections: Unknown (05/12/2022)   Received from [facility]  Tobacco Use: Low Risk  (01/21/2024)   SDOH Interventions:     Readmission Risk Interventions     No data to display

## 2024-01-22 NOTE — Consult Note (Addendum)
CARDIOLOGY CONSULT NOTE       Patient ID: Donna Baldwin MRN: [id number] DOB/AGE: [birth year] 83 y.o.  Admit date: 01/21/2024 Referring Physician: Katrinka Blazing Primary Physician: Laurann Montana, MD Primary Cardiologist: Jacinto Halim Reason for Consultation: Palpitations  Principal Problem:   Palpitations Active Problems:   Thrombocytopenia (HCC)   HPI:  83 y.o. admitted with palpitations. 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 Moved here from [place] in 1969. She has seen Dr Jacinto Halim in past for palpitations. She had a benign monitor in June 2022. She has no known structural heart disease. She comments on an "aneurysm:" but this was only and atrial septal aneurysm with no PFO and is not clinically significant. Over the last month she has had rapid heart beats. Started at night. Felt like blood draining to her legs. Interestingly she felt the urge to urinate after these episodes. Showed me HR variability between about 55 and 130 on her fit bit. She does not drink caffeine, Hct 39.3 and TSH pending. She had a minimally elevated D dimer and is going for V/Q scan now. Her ECG;s in ER vary from SR 55 to ST  rate 118 The P wave morphology is normal and not ectopic. There is no documentation in past or now of flutter/PAF. She has no chest pain mild dyspnea with episodes that lasts a minute at most. No pre syncope. Although she has some cramping in arms/legs no real claudication has PVD documented on prior ABI 05/12/23 o.48 on right and 0.72 on left She has 40-59% LICA stenosis by duplex 11/14/22 Echo this admission is pending   ROS All other systems reviewed and negative except as noted above  Past Medical History:  Diagnosis Date   Acid reflux    Anemia    Anxiety    Back pain    GERD (gastroesophageal reflux disease)    Hyperlipidemia    Hypertension    Hypertensive heart disease without heart failure    Thrombocytopenia (HCC)    Vitamin D deficiency     Family History   Problem Relation Age of Onset   Cancer Father    Allergic rhinitis Neg Hx    Angioedema Neg Hx    Asthma Neg Hx    Atopy Neg Hx    Eczema Neg Hx    Immunodeficiency Neg Hx    Urticaria Neg Hx     Social History   Socioeconomic History   Marital status: Married    Spouse name: Not on file   Number of children: 2   Years of education: 14   Highest education level: Not on file  Occupational History   Not on file  Tobacco Use   Smoking status: Never   Smokeless tobacco: Never  Vaping Use   Vaping status: Never Used  Substance and Sexual Activity   Alcohol use: No    Alcohol/week: 0.0 standard drinks of alcohol   Drug use: No   Sexual activity: Never  Other Topics Concern   Not on file  Social History Narrative   Right handed   Some tea  (smoothie tea for bowel movement once a week)   Lives w/ husband   Social Drivers of Corporate investment banker Strain: Not on file  Food Insecurity: No Food Insecurity (01/22/2024)   Hunger Vital Sign    Worried About Running Out of Food in the Last Year: Never true    Ran Out of Food in the Last Year: Never true  Transportation Needs: No Transportation Needs (01/22/2024)   PRAPARE - Administrator, Civil Service (Medical): No    Lack of Transportation (Non-Medical): No  Physical Activity: Not on file  Stress: Not on file  Social Connections: Unknown (05/12/2022)   [facility]   Social Network    Social Network: Not on file  Intimate Partner Violence: Not At Risk (01/22/2024)   Humiliation, Afraid, Rape, and Kick questionnaire    Fear of Current or Ex-Partner: No    Emotionally Abused: No    Physically Abused: No    Sexually Abused: No    Past Surgical History:  Procedure Laterality Date   ESOPHAGEAL MANOMETRY N/A 03/15/2020   Procedure: ESOPHAGEAL MANOMETRY (EM);  Surgeon: Charlott Rakes, MD;  Location: [facility];  Service: Endoscopy;  Laterality: N/A;   EYE SURGERY      HELLER MYOTOMY N/A 04/18/2020   Procedure: LAPAROSCOPIC HELLER MYOTOMY WITH UPPER ENDOSCOPY;  Surgeon: Kinsinger, De Blanch, MD;  Location: [facility];  Service: General;  Laterality: N/A;   PH IMPEDANCE STUDY N/A 03/15/2020   Procedure: PH IMPEDANCE STUDY;  Surgeon: Charlott Rakes, MD;  Location: [facility];  Service: Endoscopy;  Laterality: N/A;   SHOULDER SURGERY Bilateral    TONSILLECTOMY        Current Facility-Administered Medications:    acetaminophen (TYLENOL) tablet 650 mg, 650 mg, Oral, Q6H PRN **OR** acetaminophen (TYLENOL) suppository 650 mg, 650 mg, Rectal, Q6H PRN, Katrinka Blazing, Rondell A, MD   enoxaparin (LOVENOX) injection 40 mg, 40 mg, Subcutaneous, Q24H, Smith, Rondell A, MD, 40 mg at 01/22/24 1253   ondansetron (ZOFRAN) tablet 4 mg, 4 mg, Oral, Q6H PRN **OR** ondansetron (ZOFRAN) injection 4 mg, 4 mg, Intravenous, Q6H PRN, Smith, Rondell A, MD   sodium chloride flush (NS) 0.9 % injection 3 mL, 3 mL, Intravenous, Q12H, Smith, Rondell A, MD, 3 mL at 01/22/24 1254  enoxaparin (LOVENOX) injection  40 mg Subcutaneous Q24H   sodium chloride flush  3 mL Intravenous Q12H     Physical Exam: Blood pressure (!) 169/82, pulse 71, temperature 98 F (36.7 C), temperature source Oral, resp. rate 20, height 5\' 5"  (1.651 m), weight 49 kg, SpO2 100%.    Thin black female No murmur  Clear lungs Abdomen benign Decreased right pedal pulses No thyromegaly  Labs:   Lab Results  Component Value Date   WBC 3.2 (L) 01/21/2024   HGB 12.0 01/21/2024   HCT 39.3 01/21/2024   MCV 73.9 (L) 01/21/2024   PLT 111 (L) 01/21/2024    Recent Labs  Lab 01/21/24 1625  NA 139  K 4.1  CL 102  CO2 23  BUN 12  CREATININE 0.96  CALCIUM 9.9  PROT 7.0  BILITOT 0.7  ALKPHOS 98  ALT 12  AST 25  GLUCOSE 91   Lab Results  Component Value Date   CKTOTAL 120 04/03/2017   CKMB 3.2 09/04/2009   TROPONINI 0.03        NO INDICATION OF MYOCARDIAL INJURY. 09/04/2009    Lab Results  Component Value  Date   CHOL 256 (H) 12/18/2021   CHOL 248 (H) 10/31/2020   CHOL 250 (H) 12/28/2019   Lab Results  Component Value Date   HDL 86 12/18/2021   HDL 94 10/31/2020   HDL 77 12/28/2019   Lab Results  Component Value Date   LDLCALC 159 (H) 12/18/2021   LDLCALC 141 (H) 10/31/2020   LDLCALC 158 (H) 12/28/2019   Lab Results  Component Value Date   TRIG 67 12/18/2021   TRIG 77 10/31/2020   TRIG 89 12/28/2019   Lab Results  Component Value Date   CHOLHDL 2.6 10/31/2020   CHOLHDL 3.2 12/28/2019   CHOLHDL 3.3 07/12/2014   No results found for: "LDLDIRECT"    Radiology: [facility] Chest Port 1 View Result Date: 01/21/2024 CLINICAL DATA:  Chest pain and tachycardia. EXAM: PORTABLE CHEST 1 VIEW COMPARISON:  Chest radiograph dated 08/05/2022. FINDINGS: No focal consolidation, pleural effusion, or pneumothorax. Stable cardiac silhouette. Atherosclerotic calcification of the aortic arch. No acute osseous pathology. IMPRESSION: No active disease. Electronically Signed   By: Elgie Collard M.D.   On: 01/21/2024 16:20    EKG:  See HPI SR at variable rates 55-118    ASSESSMENT AND PLAN:   Palpitations: with variable HR. No documentation of Rx arrhythmias No SVT, atrial tachycardia, PAF or flutter. Prior monitor 2022 also negative. Most interesting part of her history is urge to urinate after HR slows which can be seen with SVT. She will need a 30 day monitor on d/c. If this is negative did discuss possible ILR by EP as a longer monitoring solution. TSH is pending as is updated echo.  PVD:  she has 40-59% LICA stenosis and needs updated carotid duplex as well as LE PVD needs updated ABI's No TIA symptoms and no claudication or skin breakdown. Looks like both were ordered in 2024 but not done.  D-dimer:  mild elevation lungs clear sats ok CXR NAD Suspect this will be negative  HTN:  on Adalat at home stronger dihydropyridine and may be contributing to her symptoms of "blood pooling" would change this to  Norvasc she has 47 allergies listed one to cardizem with foot sores but has been on nifedipine HLD:  on repatha at home  target LDL < 70 with PVD/carotid plaque will write for updated lipids in am  Plan pending  V/Q Echo D/c with 30 day monitor and then possible ILR Outpatient carotid/ABI's  Lipid/Liver am D/C Adalat start Norvasc 5 mg Has f/u with [facility] first week in February already  Signed: Charlton Haws 01/22/2024, 1:53 PM

## 2024-01-22 NOTE — Plan of Care (Signed)
  Problem: Clinical Measurements: Goal: Ability to maintain clinical measurements within normal limits will improve Outcome: Progressing   Problem: Education: Goal: Knowledge of General Education information will improve Description: Including pain rating scale, medication(s)/side effects and non-pharmacologic comfort measures Outcome: Progressing   Problem: Clinical Measurements: Goal: Will remain free from infection Outcome: Progressing   Problem: Clinical Measurements: Goal: Diagnostic test results will improve Outcome: Progressing   Problem: Clinical Measurements: Goal: Respiratory complications will improve Outcome: Progressing   Problem: Clinical Measurements: Goal: Cardiovascular complication will be avoided Outcome: Progressing   Problem: Activity: Goal: Risk for activity intolerance will decrease Outcome: Progressing

## 2024-01-22 NOTE — Care Management Obs Status (Signed)
MEDICARE OBSERVATION STATUS NOTIFICATION   Patient Details  Name: Donna Baldwin MRN: [id number] Date of Birth: 27-Oct-1941   Medicare Observation Status Notification Given:  Yes    Leone Haven, RN 01/22/2024, 4:17 PM

## 2024-01-23 ENCOUNTER — Other Ambulatory Visit: Payer: Self-pay | Admitting: Student

## 2024-01-23 ENCOUNTER — Observation Stay (HOSPITAL_BASED_OUTPATIENT_CLINIC_OR_DEPARTMENT_OTHER): Payer: HMO

## 2024-01-23 ENCOUNTER — Observation Stay (HOSPITAL_COMMUNITY): Payer: HMO

## 2024-01-23 ENCOUNTER — Telehealth: Payer: Self-pay | Admitting: Cardiology

## 2024-01-23 DIAGNOSIS — M7989 Other specified soft tissue disorders: Secondary | ICD-10-CM

## 2024-01-23 DIAGNOSIS — I7090 Unspecified atherosclerosis: Secondary | ICD-10-CM | POA: Diagnosis not present

## 2024-01-23 DIAGNOSIS — R002 Palpitations: Secondary | ICD-10-CM

## 2024-01-23 DIAGNOSIS — Z91040 Latex allergy status: Secondary | ICD-10-CM | POA: Diagnosis not present

## 2024-01-23 DIAGNOSIS — R791 Abnormal coagulation profile: Secondary | ICD-10-CM | POA: Diagnosis not present

## 2024-01-23 DIAGNOSIS — Z7901 Long term (current) use of anticoagulants: Secondary | ICD-10-CM | POA: Diagnosis not present

## 2024-01-23 LAB — CBC
HCT: 35.8 % — ABNORMAL LOW (ref 36.0–46.0)
Hemoglobin: 11.2 g/dL — ABNORMAL LOW (ref 12.0–15.0)
MCH: 22.9 pg — ABNORMAL LOW (ref 26.0–34.0)
MCHC: 31.3 g/dL (ref 30.0–36.0)
MCV: 73.2 fL — ABNORMAL LOW (ref 80.0–100.0)
Platelets: 108 10*3/uL — ABNORMAL LOW (ref 150–400)
RBC: 4.89 MIL/uL (ref 3.87–5.11)
RDW: 14.6 % (ref 11.5–15.5)
WBC: 3 10*3/uL — ABNORMAL LOW (ref 4.0–10.5)
nRBC: 0 % (ref 0.0–0.2)

## 2024-01-23 LAB — BASIC METABOLIC PANEL
Anion gap: 8 (ref 5–15)
BUN: 11 mg/dL (ref 8–23)
CO2: 26 mmol/L (ref 22–32)
Calcium: 9.4 mg/dL (ref 8.9–10.3)
Chloride: 107 mmol/L (ref 98–111)
Creatinine, Ser: 0.93 mg/dL (ref 0.44–1.00)
GFR, Estimated: >60 mL/min (ref >60)
Glucose, Bld: 92 mg/dL (ref 70–99)
Potassium: 4 mmol/L (ref 3.5–5.1)
Sodium: 141 mmol/L (ref 135–145)

## 2024-01-23 LAB — PHOSPHORUS: Phosphorus: 3.9 mg/dL (ref 2.5–4.6)

## 2024-01-23 LAB — MAGNESIUM: Magnesium: 2.1 mg/dL (ref 1.7–2.4)

## 2024-01-23 MED ORDER — HYDRALAZINE HCL 50 MG PO TABS
50.0000 mg | ORAL_TABLET | Freq: Four times a day (QID) | ORAL | Status: DC | PRN
  Filled 2024-01-23: qty 1

## 2024-01-23 NOTE — Progress Notes (Signed)
Upper extremity venous duplex completed. Please see CV Procedures for preliminary results.  Shona Simpson, RVT 01/23/24 11:01 AM

## 2024-01-23 NOTE — Progress Notes (Signed)
Ordered 30 day Event Monitor for further evaluation of palpitations at the request of Dr. Eden Emms. Please see his rounding note from today for more information.  Corrin Parker, PA-C 01/23/2024 8:57 AM

## 2024-01-23 NOTE — Progress Notes (Signed)
[facility] Progress Note  Patient: Donna Baldwin    ZOX:[id number]  DOA: 01/21/2024     Date of Service: the patient was seen and examined on 01/23/2024  Chief Complaint  Patient presents with   Irregular Heart Beat   Brief hospital course: Donna Baldwin is a 83 y.o. female with medical history significant of hypertension, hyperlipidemia anxiety, anemia, and GERD presented to the hospital due to erratic heart palpitations.   At [facility] patient was noted to be afebrile with heart rates ranging from 45-1 33, respirations 12-33, blood pressures 113/77 187/85, and O2 saturations maintained on room air. Labs noted WBC 3.2, MCV 73.9, MCH 22.6 platelets 111, D-dimer elevated 1.07, high-sensitivity troponins negative x 2.  CT angiogram of the chest was to be obtained, but patient noted to have anaphylaxis to contrast dye.   Cardiology was consulted.  Further management as below  Assessment and Plan:  # Palpitations Acute.  Patient presents with reports of heart rates ranging anywhere from 39-130s since 1/17.  During episodes patient reports feeling lightheaded as though she could pass out.  High-sensitivity troponins were negative x 2.  Patient previously wore a Holter monitor back in 2022 which noted 2 episodes of atrial tachycardia with longest lasting 12.6 seconds, and single sinus pause lasting 3.1 seconds.  Medication regimen currently includes includes ninifedipine 30 mg daily. Continue to monitor on telemetry TTE shows LVEF 6065%, grade 1 diastolic dysfunction, no any other significant findings. Troponin negative x 2 TSH 2.1 WNL -Nifedipine stopped and started on amlodipine -Cardiology to arrange outpatient telemetry monitoring -Cardiology consulted,  will follow-up for any further recommendations   Elevated D-dimer Acute.  D-dimer 1.07.  Patient reports history of anaphylaxis with IV contrast dye. - VQ scan negative for PE   Diastolic dysfunction Patient does  not appear to be acutely fluid overloaded at this time.  Last echocardiogram noted EF to be approximately 54% with grade 2 diastolic dysfunction when checked back in 06/2021. TTE as above   Peripheral vascular disease Patient reports having a feeling of blood pooling in her legs.  Previous ultrasound from 05/12/2023 noted resting ABI on the right in the moderate range arterial occlusive disease with segmental exam demonstrating SFA occlusion.  Left leg noted resting ABI in the mild range with segmental exam demonstrating popliteal artery/tibial artery occlusive disease.  Previous allergies include pitavastatin which caused facial swelling. -ABI: Right: Resting right ankle-brachial index indicates moderate right lower extremity arterial disease. The right toe-brachial index is abnormal.   Left: Resting left ankle-brachial index indicates moderate left lower extremity arterial disease. The left toe-brachial index is abnormal.   Recommend to follow-up with vascular surgery as an outpatient  Leukopenia Chronic.  WBC 3.2 and similar to previous.  Patient otherwise noted to be afebrile. -Continue to monitor.  May warrant outpatient hematology oncology for further workup but differentials appear to be stable   Thrombocytopenia Chronic.  Platelet count 111 which appears similar to prior. -Continue to monitor Follow-up with PCP to repeat CBC after 1 to 2 weeks and follow-up with hematology if no resolution of leukopenia and thrombocytopenia.  Anxiety -Continue Ativan as needed   Weight loss Patient reports normally when around 110 pounds but recently had noted down to 106. -Follow-up TSH   Body mass index is 17.24 kg/m.  Interventions:  Diet: Heart healthy diet DVT Prophylaxis: Subcutaneous Lovenox   Advance goals of care discussion: Full code  Family Communication: family was present at bedside, at the time of  interview.  The pt provided permission to discuss medical plan with the family.  Opportunity was given to ask question and all questions were answered satisfactorily.   Disposition:  Pt is from Home, admitted with palpitations,  which precludes a safe discharge. Discharge to home, when stable and cleared by cardiology.  Most likely tomorrow a.m.  Subjective: No significant events overnight, in the morning time patient had swelling of right arm, during my exam it resolved.  Currently patient is not having any symptoms, no chest pain or palpitation, no shortness of breath.  Physical Exam: General: NAD, lying comfortably Appear in no distress, affect appropriate Eyes: PERRLA ENT: Oral Mucosa Clear, moist  Neck: no JVD,  Cardiovascular: S1 and S2 Present, no Murmur,  Respiratory: good respiratory effort, Bilateral Air entry equal and Decreased, no Crackles, no wheezes Abdomen: Bowel Sound present, Soft and no tenderness,  Skin: no rashes Extremities: no Pedal edema, no calf tenderness Neurologic: without any new focal findings Gait not checked due to patient safety concerns  Vitals:   01/23/24 0555 01/23/24 0721 01/23/24 1147 01/23/24 1608  BP: 136/74 (!) 159/64 137/68 131/69  Pulse: (!) 51 62 (!) 51 (!) 51  Resp:  18 20 18   Temp:  (!) 97.3 F (36.3 C) (!) 97.4 F (36.3 C) 97.7 F (36.5 C)  TempSrc:  Oral Oral Oral  SpO2:  100% 100% 100%  Weight:      Height:        Intake/Output Summary (Last 24 hours) at 01/23/2024 1736 Last data filed at 01/23/2024 1314 Gross per 24 hour  Intake 240 ml  Output 1200 ml  Net -960 ml   Filed Weights   01/21/24 1550 01/22/24 0325 01/23/24 0420  Weight: 49 kg 49 kg 47 kg    Data Reviewed: I have personally reviewed and interpreted daily labs, tele strips, imagings as discussed above. I reviewed all nursing notes, pharmacy notes, vitals, pertinent old records I have discussed plan of care as described above with RN and patient/family.  CBC: Recent Labs  Lab 01/21/24 1625 01/23/24 0253  WBC 3.2* 3.0*  NEUTROABS  2.0  --   HGB 12.0 11.2*  HCT 39.3 35.8*  MCV 73.9* 73.2*  PLT 111* 108*   Basic Metabolic Panel: Recent Labs  Lab 01/21/24 1625 01/23/24 0253 01/23/24 0321  NA 139 141  --   K 4.1 4.0  --   CL 102 107  --   CO2 23 26  --   GLUCOSE 91 92  --   BUN 12 11  --   CREATININE 0.96 0.93  --   CALCIUM 9.9 9.4  --   MG  --   --  2.1  PHOS  --   --  3.9    Studies: CT HEAD WO CONTRAST ( ) Result Date: 01/23/2024 CLINICAL DATA:  Stroke suspected, irregular heartbeat EXAM: CT HEAD WITHOUT CONTRAST TECHNIQUE: Contiguous axial images were obtained from the base of the skull through the vertex without intravenous contrast. RADIATION DOSE REDUCTION: This exam was performed according to the departmental dose-optimization program which includes automated exposure control, adjustment of the mA and/or kV according to patient size and/or use of iterative reconstruction technique. COMPARISON:  08/05/2022 FINDINGS: Brain: No evidence of acute infarction, hemorrhage, mass, mass effect, or midline shift. No hydrocephalus or extra-axial fluid collection. Normal cerebral volume for age. Vascular: No hyperdense vessel. Skull: Negative for fracture or focal lesion. Sinuses/Orbits: No acute finding. Status post bilateral lens replacements. Other: The mastoid air  cells are well aerated. IMPRESSION: No acute intracranial process. Electronically Signed   By: Wiliam Ke M.D.   On: 01/23/2024 16:12   VAS [place] ABI WITH/WO TBI Result Date: 01/23/2024  LOWER EXTREMITY DOPPLER STUDY Patient Name:  KENNETHA PEARMAN  Date of Exam:   01/23/2024 Medical Rec #: [id number]             Accession #:    [id number] Date of Birth: 04-28-1941             Patient Gender: F Patient Age:   22 years Exam Location:  [facility] Procedure:      VAS [place] ABI WITH/WO TBI Referring Phys: Madelyn Flavors --------------------------------------------------------------------------------  Indications: Peripheral artery disease. High Risk  Factors: Hypertension, hyperlipidemia.  Comparison Study: None. Performing Technologist: Shona Simpson  Examination Guidelines: A complete evaluation includes at minimum, Doppler waveform signals and systolic blood pressure reading at the level of bilateral brachial, anterior tibial, and posterior tibial arteries, when vessel segments are accessible. Bilateral testing is considered an integral part of a complete examination. Photoelectric Plethysmograph (PPG) waveforms and toe systolic pressure readings are included as required and additional duplex testing as needed. Limited examinations for reoccurring indications may be performed as noted.  ABI Findings: +---------+------------------+-----+-------------------+----------------+ Right    Rt Pressure (mmHg)IndexWaveform           Comment          +---------+------------------+-----+-------------------+----------------+ Brachial 129                    biphasic           Audibly biphasic +---------+------------------+-----+-------------------+----------------+ PTA      72                0.55 monophasic                          +---------+------------------+-----+-------------------+----------------+ DP       53                0.40 dampened monophasic                 +---------+------------------+-----+-------------------+----------------+ Great Toe35                0.27 Abnormal                            +---------+------------------+-----+-------------------+----------------+ +---------+------------------+-----+----------+-------+ Left     Lt Pressure (mmHg)IndexWaveform  Comment +---------+------------------+-----+----------+-------+ Brachial 132                    biphasic          +---------+------------------+-----+----------+-------+ PTA      103               0.78 monophasic        +---------+------------------+-----+----------+-------+ DP       98                0.74 monophasic         +---------+------------------+-----+----------+-------+ Great Toe61                0.46 Normal            +---------+------------------+-----+----------+-------+ +-------+-----------+-----------+------------+------------+ ABI/TBIToday's ABIToday's TBIPrevious ABIPrevious TBI +-------+-----------+-----------+------------+------------+ Right  0.55       0.27                                +-------+-----------+-----------+------------+------------+  Left   0.78       0.46                                +-------+-----------+-----------+------------+------------+  Summary: Right: Resting right ankle-brachial index indicates moderate right lower extremity arterial disease. The right toe-brachial index is abnormal. Left: Resting left ankle-brachial index indicates moderate left lower extremity arterial disease. The left toe-brachial index is abnormal. *See table(s) above for measurements and observations.     Preliminary    VAS [place] UPPER EXTREMITY VENOUS DUPLEX Result Date: 01/23/2024 UPPER VENOUS STUDY  Patient Name:  PETER DAQUILA  Date of Exam:   01/23/2024 Medical Rec #: [id number]             Accession #:    [id number] Date of Birth: 1941-06-04             Patient Gender: F Patient Age:   81 years Exam Location:  [facility] Procedure:      VAS [place] UPPER EXTREMITY VENOUS DUPLEX Referring Phys: [name] --------------------------------------------------------------------------------  Indications: Swelling, and Pain Risk Factors: None identified. Comparison Study: None. Performing Technologist: Shona Simpson  Examination Guidelines: A complete evaluation includes B-mode imaging, spectral Doppler, color Doppler, and power Doppler as needed of all accessible portions of each vessel. Bilateral testing is considered an integral part of a complete examination. Limited examinations for reoccurring indications may be performed as noted.  Right Findings:  +----------+------------+---------+-----------+----------+-------+ RIGHT     CompressiblePhasicitySpontaneousPropertiesSummary +----------+------------+---------+-----------+----------+-------+ IJV           Full       Yes       Yes                      +----------+------------+---------+-----------+----------+-------+ Subclavian    Full       Yes       Yes                      +----------+------------+---------+-----------+----------+-------+ Axillary      Full       Yes       Yes                      +----------+------------+---------+-----------+----------+-------+ Brachial      Full       Yes       Yes                      +----------+------------+---------+-----------+----------+-------+ Radial        Full       Yes       Yes                      +----------+------------+---------+-----------+----------+-------+ Ulnar         Full       Yes       Yes                      +----------+------------+---------+-----------+----------+-------+ Cephalic      Full                 Yes                      +----------+------------+---------+-----------+----------+-------+ Basilic       Full  Yes                      +----------+------------+---------+-----------+----------+-------+  Left Findings: +----------+------------+---------+-----------+----------+-------+ LEFT      CompressiblePhasicitySpontaneousPropertiesSummary +----------+------------+---------+-----------+----------+-------+ Subclavian    Full       Yes       Yes                      +----------+------------+---------+-----------+----------+-------+  Summary:  Right: No evidence of deep vein thrombosis in the upper extremity. No evidence of superficial vein thrombosis in the upper extremity.  Left: No evidence of thrombosis in the subclavian.  *See table(s) above for measurements and observations.  Diagnosing physician: Carolynn Sayers Electronically signed by Carolynn Sayers on 01/23/2024 at 1:57:22 PM.    Final    DG Chest Port 1 View Result Date: 01/23/2024 CLINICAL DATA:  Chest pain EXAM: PORTABLE CHEST 1 VIEW COMPARISON:  CXR 01/21/24 FINDINGS: No pleural effusion. No pneumothorax. Normal cardiac and mediastinal contours. No focal airspace opacity. No radiographically apparent displaced rib fractures. Visualized upper abdomen is unremarkable. Aortic atherosclerotic calcifications IMPRESSION: No focal airspace opacity Electronically Signed   By: Lorenza Cambridge M.D.   On: 01/23/2024 10:19    Scheduled Meds:  amLODipine  5 mg Oral Daily   enoxaparin (LOVENOX) injection  40 mg Subcutaneous Q24H   sodium chloride flush  3 mL Intravenous Q12H   Continuous Infusions: PRN Meds: acetaminophen **OR** acetaminophen, hydrALAZINE, ondansetron **OR** ondansetron (ZOFRAN) IV  Time spent: 35 minutes  Author: Gillis Santa. MD [facility] 01/23/2024 5:36 PM  To reach On-call, see care teams to locate the attending and reach out to them via [email or web address]. If 7PM-7AM, please contact night-coverage If you still have difficulty reaching the attending provider, please page the [facility] (Director on Call) for [facility] on amion for assistance.

## 2024-01-23 NOTE — Plan of Care (Signed)

## 2024-01-23 NOTE — Telephone Encounter (Signed)
Left voicemail to return call to office

## 2024-01-23 NOTE — Progress Notes (Signed)
Cardiology:  Donna Baldwin  Subjective:  Lots of questions again Discussed changing Adalat to Norvasc and outpatient f/u plans   Objective:  Vitals:   01/23/24 0050 01/23/24 0420 01/23/24 0555 01/23/24 0721  BP: 122/69 (!) 146/71 136/74 (!) 159/64  Pulse: (!) 50 (!) 55 (!) 51 62  Resp: 18 18  18   Temp: 97.8 F (36.6 C) (!) 97.5 F (36.4 C)  (!) 97.3 F (36.3 C)  TempSrc: Oral Oral  Oral  SpO2: 99% 100%  100%  Weight:  47 kg    Height:        Intake/Output from previous day:  Intake/Output Summary (Last 24 hours) at 01/23/2024 0829 Last data filed at 01/23/2024 0429 Gross per 24 hour  Intake 120 ml  Output 950 ml  Net -830 ml    Physical Exam:  Thin black female No murmur  Clear lungs Abdomen benign Decreased right pedal pulses No thyromegaly  Liver Function Tests: Recent Labs    01/21/24 1625  AST 25  ALT 12  ALKPHOS 98  BILITOT 0.7  PROT 7.0  ALBUMIN 4.6   No results for input(s): "LIPASE", "AMYLASE" in the last 72 hours. CBC: Recent Labs    01/21/24 1625 01/23/24 0253  WBC 3.2* 3.0*  NEUTROABS 2.0  --   HGB 12.0 11.2*  HCT 39.3 35.8*  MCV 73.9* 73.2*  PLT 111* 108*   Cardiac Enzymes: No results for input(s): "CKTOTAL", "CKMB", "CKMBINDEX", "TROPONINI" in the last 72 hours. BNP: Invalid input(s): "POCBNP" D-Dimer: Recent Labs    01/21/24 1805  DDIMER 1.07*    Thyroid Function Tests: Recent Labs    01/22/24 1239  TSH 2.118   Anemia Panel: No results for input(s): "VITAMINB12", "FOLATE", "FERRITIN", "TIBC", "IRON", "RETICCTPCT" in the last 72 hours.  Imaging: ECHOCARDIOGRAM COMPLETE Result Date: 01/22/2024    ECHOCARDIOGRAM REPORT   Patient Name:   Donna Baldwin Date of Exam: 01/22/2024 Medical Rec #:  [id number]            Height:       65.0 in Accession #:    [id number]           Weight:       108.0 lb Date of Birth:  04/26/1941            BSA:          1.523 m Patient Age:    83 years             BP:           169/82 mmHg Patient  Gender: F                    HR:           55 bpm. Exam Location:  Inpatient Procedure: 2D Echo, Cardiac Doppler and Color Doppler Indications:    Abnormal ECG R94.31  History:        Patient has no prior history of Echocardiogram examinations.                 Risk Factors:Hypertension.  Sonographer:    Darlys Gales Referring Phys: [id number] RONDELL A SMITH IMPRESSIONS  1. Left ventricular ejection fraction, by estimation, is 60 to 65%. The left ventricle has normal function. The left ventricle has no regional wall motion abnormalities. Left ventricular diastolic parameters are consistent with Grade I diastolic dysfunction (impaired relaxation).  2. Right ventricular systolic function is normal. The right ventricular size is normal. There is  normal pulmonary artery systolic pressure. The estimated right ventricular systolic pressure is 20.6 mmHg.  3. The mitral valve is grossly normal. Trivial mitral valve regurgitation. No evidence of mitral stenosis.  4. The aortic valve is tricuspid. Aortic valve regurgitation is not visualized. No aortic stenosis is present.  5. The inferior vena cava is normal in size with greater than 50% respiratory variability, suggesting right atrial pressure of 3 mmHg. FINDINGS  Left Ventricle: Left ventricular ejection fraction, by estimation, is 60 to 65%. The left ventricle has normal function. The left ventricle has no regional wall motion abnormalities. The left ventricular internal cavity size was normal in size. There is  no left ventricular hypertrophy. Left ventricular diastolic parameters are consistent with Grade I diastolic dysfunction (impaired relaxation). Right Ventricle: The right ventricular size is normal. No increase in right ventricular wall thickness. Right ventricular systolic function is normal. There is normal pulmonary artery systolic pressure. The tricuspid regurgitant velocity is 2.10 m/s, and  with an assumed right atrial pressure of 3 mmHg, the estimated right  ventricular systolic pressure is 20.6 mmHg. Left Atrium: Left atrial size was normal in size. Right Atrium: Right atrial size was normal in size. Pericardium: Trivial pericardial effusion is present. Mitral Valve: The mitral valve is grossly normal. Trivial mitral valve regurgitation. No evidence of mitral valve stenosis. Tricuspid Valve: The tricuspid valve is grossly normal. Tricuspid valve regurgitation is mild . No evidence of tricuspid stenosis. Aortic Valve: The aortic valve is tricuspid. Aortic valve regurgitation is not visualized. No aortic stenosis is present. Aortic valve mean gradient measures 3.0 mmHg. Aortic valve peak gradient measures 5.5 mmHg. Aortic valve area, by VTI measures 2.20 cm. Pulmonic Valve: The pulmonic valve was grossly normal. Pulmonic valve regurgitation is trivial. No evidence of pulmonic stenosis. Aorta: The aortic root is normal in size and structure. Venous: The inferior vena cava is normal in size with greater than 50% respiratory variability, suggesting right atrial pressure of 3 mmHg. IAS/Shunts: The atrial septum is grossly normal.  LEFT VENTRICLE PLAX 2D LVIDd:         4.10 cm   Diastology LVIDs:         2.80 cm   LV e' medial:    5.87 cm/s LV PW:         0.80 cm   LV E/e' medial:  12.2 LV IVS:        0.80 cm   LV e' lateral:   10.60 cm/s LVOT diam:     1.80 cm   LV E/e' lateral: 6.8 LV SV:         67 LV SV Index:   44 LVOT Area:     2.54 cm  RIGHT VENTRICLE RV S prime:     13.10 cm/s TAPSE (M-mode): 2.4 cm LEFT ATRIUM             Index        RIGHT ATRIUM           Index LA Vol (A2C):   26.8 ml 17.60 ml/m  RA Area:     11.00 cm LA Vol (A4C):   25.9 ml 17.01 ml/m  RA Volume:   21.00 ml  13.79 ml/m LA Biplane Vol: 27.5 ml 18.06 ml/m  AORTIC VALVE AV Area (Vmax):    2.48 cm AV Area (Vmean):   2.03 cm AV Area (VTI):     2.20 cm AV Vmax:           117.00 cm/s AV  Vmean:          87.100 cm/s AV VTI:            0.306 m AV Peak Grad:      5.5 mmHg AV Mean Grad:      3.0  mmHg LVOT Vmax:         114.00 cm/s LVOT Vmean:        69.600 cm/s LVOT VTI:          0.264 m LVOT/AV VTI ratio: 0.86  AORTA Ao Root diam: 2.80 cm MITRAL VALVE               TRICUSPID VALVE MV Area (PHT): 3.77 cm    TR Peak grad:   17.6 mmHg MV Decel Time: 201 msec    TR Vmax:        210.00 cm/s MV E velocity: 71.80 cm/s MV A velocity: 75.50 cm/s  SHUNTS MV E/A ratio:  0.95        Systemic VTI:  0.26 m                            Systemic Diam: 1.80 cm Lennie Odor MD Electronically signed by Lennie Odor MD Signature Date/Time: 01/22/2024/4:37:31 PM    Final    NM Pulmonary Perfusion Result Date: 01/22/2024 CLINICAL DATA:  Palpitations, positive D dimer, chest pain and tachycardia EXAM: NUCLEAR MEDICINE PERFUSION LUNG SCAN TECHNIQUE: Perfusion images were obtained in multiple projections after intravenous injection of radiopharmaceutical. Ventilation scans intentionally deferred if perfusion scan and chest x-ray adequate for interpretation. RADIOPHARMACEUTICALS:  4.0 mCi Tc-45m MAA IV COMPARISON:  Chest x-ray 01/21/2024 FINDINGS: Planar imaging of the lungs was performed in multiple projections during the perfusion exam. There are no wedge-shaped perfusion defects. Normal perfusion throughout the lung parenchyma. IMPRESSION: 1. No evidence of pulmonary embolus. Electronically Signed   By: Sharlet Salina M.D.   On: 01/22/2024 14:55   DG Chest Port 1 View Result Date: 01/21/2024 CLINICAL DATA:  Chest pain and tachycardia. EXAM: PORTABLE CHEST 1 VIEW COMPARISON:  Chest radiograph dated 08/05/2022. FINDINGS: No focal consolidation, pleural effusion, or pneumothorax. Stable cardiac silhouette. Atherosclerotic calcification of the aortic arch. No acute osseous pathology. IMPRESSION: No active disease. Electronically Signed   By: Elgie Collard M.D.   On: 01/21/2024 16:20    Cardiac Studies:  ECG: SR/ST variable rates no flutter, fib or SVT    Telemetry:  SR variable rates   Echo: EF 60-65% no significant  valve dx   Medications:    amLODipine  5 mg Oral Daily   enoxaparin (LOVENOX) injection  40 mg Subcutaneous Q24H   sodium chloride flush  3 mL Intravenous Q12H      Assessment/Plan:  Palpitations: with variable HR. No documentation of Rx arrhythmias No SVT, atrial tachycardia, PAF or flutter. Prior monitor 2022 also negative. Most interesting part of her history is urge to urinate after HR slows which can be seen with SVT. She will need a 30 day monitor on d/c. If this is negative did discuss possible ILR by EP as a longer monitoring solution. TSH is normal and TTE shows no structural heart dx and normal EF  PVD:  she has 40-59% LICA stenosis and needs updated carotid duplex as well as LE PVD needs updated ABI's No TIA symptoms and no claudication or skin breakdown. Looks like both were ordered in 2024 but not done. ABI's pending in house but can be done  as outpatient  D-dimer:  mild elevation lungs clear sats ok CXR NAD V/Q scan normal  HTN:  on Adalat at home stronger dihydropyridine and may be contributing to her symptoms of "blood pooling" would change this to Norvasc she has 47 allergies listed one to cardizem with foot sores but has been on nifedipine HLD:  on repatha at home  target LDL < 70 with PVD/carotid plaque will write for updated lipids in am  Have messaged Dr Donna Baldwin and PA to arrange 30 day monitor  Grand daughter and patient seem unpleased about lack of arrhythmic Diagnosis and cramps in RUE UE venous dopplers ordered and pending  Repeat carotids can evaluate for any subclavian steal but has good right radial pulse   Charlton Haws 01/23/2024, 8:29 AM

## 2024-01-23 NOTE — Telephone Encounter (Signed)
TOC per Dr. Eden Emms scheduled for 02/02/24 at 10am with Dr. Jacinto Halim

## 2024-01-23 NOTE — Progress Notes (Signed)
Ankle-brachial index completed. Please see CV Procedures for preliminary results.  Shona Simpson, RVT 01/23/24 3:46 PM

## 2024-01-23 NOTE — Evaluation (Signed)
Physical Therapy Evaluation & Vestibular Assessment Patient Details Name: Donna Baldwin MRN: [id number] DOB: 11/30/1941 Today's Date: 01/23/2024  History of Present Illness  Pt is an 83 y.o. female who presented 01/21/24 with erratic heart palpitations. PMH: HTN, HLD, anxiety, anemia, GERD, thrombocytopenia   Clinical Impression  Pt presents with condition above and deficits mentioned below, see PT Problem List. PTA, she was independent without DME, living with her husband in a 1-level house with 4 STE. Currently, pt is limited by intermittent dizziness, impacting her balance and safety with functional mobility. She ambulates smoothly without LOB when walking a straight path without dynamic gait challenges, but she does tend to drift and stagger laterally when performing quick head or body turns. She is able to recover on her own with reactional step strategies though. In regards to her dizziness, see Vestibular Assessment below. Orthostatics were negative. HR ranged from 58-115 bpm. Her vestibular assessment below describes how the pt had sudden onset dizziness upon waking up on 01/20/24 and it last half a day, but improved after taking zyrtec. She also was noted to have a correction with L head impulse test and x2 beats of nystagmus to the L when rapidly tracking to the L. She also was noted to not hear as well out of her L ear. Question potential inflammation of her sinuses that may have contributed to her symptoms based on the information above. However, it is concerning that the pt displayed hypermetria and difficulty even initiating movement with L finger to nose testing today though. Notified MD. Recommending follow-up with OP Vestibular PT to ensure that if there is a vestibular component to her dizziness then it is addressed. Will continue to follow acutely.   Vestibular Assessment - 01/23/24 0001       Vestibular Assessment   General Observation While the pt describes symptoms of  vertigo, she did not demonstrate nystagmus and her symptoms were not reproduced with testing for BPPV. She did demonstrate x2 L beats of nystagmus with rapid smooth pursuit to her L, but no nystagmus with tracking to her R. She did become dizzy and had difficulty coordinating her eyes with rapid smooth pursuits and saccades, laterally > vertically. Provided pt with HEP handout to practice/progress these to try to improve her symptoms.      Symptom Behavior   Subjective history of current problem Pt reports she had sudden onset of "vertigo" when she woke up on 01/20/24 and it lasted half the day, denying it improving for any brief periods of time throughout that half day. She also describes it as if she was about to pass out. Her symptoms improved while laying still with eyes closed after taking zyrtec. She reports a hx of vertigo x10 years ago, which she treated with "some medicine" in the hospital but then did not take any medicine at home for it. She denied doing any rolling techniques for it either. Pt also endorses PRN dizzines over the past couple years.    Type of Dizziness  Spinning;Vertigo;Comment   "about to pass out"   Frequency of Dizziness 1x on 01/20/24, PRN for the past couple years though    Duration of Dizziness half a day    Symptom Nature Spontaneous;Constant    Aggravating Factors Activity in general    Relieving Factors Head stationary;Lying supine;Closing eyes;Comments   taking zyrtec   Progression of Symptoms Better    History of similar episodes x10 years ago and then PRN over the past couple years  Oculomotor Exam   Oculomotor Alignment Normal    Ocular ROM WFL    Spontaneous Absent    Gaze-induced  Absent    Smooth Pursuits Comment   x2 beats to L when tracking to far L quickly, did not see any beats to R; dizziness reported   Saccades Slow;Poor trajectory   slower and more difficulty with lateral saccades vs vertical; deviates laterally mildly when in route with  vertical saccades; dizziness reported   Comment dizziness and difficulty noted with saccades and smooth pursuits, more so laterally than vertically, did not note direction changing nystagmus      Oculomotor Exam-Fixation Suppressed    Left Head Impulse x1 correction noted, no dizziness reported    Right Head Impulse negative      Auditory   Comments noted decreased detection of sound of fingers rubbing together on her L than on her R      Positional Testing   Dix-Hallpike Dix-Hallpike Right;Dix-Hallpike Left    Horizontal Canal Testing Horizontal Canal Left;Horizontal Canal Right      Dix-Hallpike Right   Dix-Hallpike Right Duration negative    Dix-Hallpike Right Symptoms No nystagmus      Dix-Hallpike Left   Dix-Hallpike Left Duration negative    Dix-Hallpike Left Symptoms No nystagmus      Horizontal Canal Right   Horizontal Canal Right Duration negative    Horizontal Canal Right Symptoms Normal      Horizontal Canal Left   Horizontal Canal Left Duration negative    Horizontal Canal Left Symptoms Normal      Cognition   Cognition Comment WFL      Orthostatics   BP supine (x 5 minutes) 140/84    HR supine (x 5 minutes) 58    BP sitting 157/82    HR sitting 87    BP standing (after 1 minute) 142/95    HR standing (after 1 minute) 98    Orthostatics Comment denied dizziness, HR max of 115 bpm when ambulating                    If plan is discharge home, recommend the following: Assistance with cooking/housework;Assist for transportation;Help with stairs or ramp for entrance   Can travel by private vehicle        Equipment Recommendations None recommended by PT  Recommendations for Other Services       Functional Status Assessment Patient has had a recent decline in their functional status and demonstrates the ability to make significant improvements in function in a reasonable and predictable amount of time.     Precautions / Restrictions  Precautions Precautions: Fall;Other (comment) Precaution Comments: watch HR Restrictions Weight Bearing Restrictions Per Provider Order: No      Mobility  Bed Mobility Overal bed mobility: Modified Independent             General bed mobility comments: Pt needed increased time to exit R EOB, but no assistance needed, HOB elevated    Transfers Overall transfer level: Needs assistance Equipment used: None Transfers: Sit to/from Stand Sit to Stand: Contact guard assist           General transfer comment: Mild sway upon standing, no LOB    Ambulation/Gait Ambulation/Gait assistance: Contact guard assist Gait Distance (Feet): 340 Feet Assistive device: None Gait Pattern/deviations: Step-through pattern, Decreased stride length, Drifts right/left Gait velocity: reduced Gait velocity interpretation: <1.8 ft/sec, indicate of risk for recurrent falls   General Gait Details: Pt initially  ambulated slowly and cautiously with decreased bil step length. Encouraged pt to increase cadence, no LOB. She did begin to stagger and drift laterally when cued to turn her head L <> R or look up <> down or turn around 360' quickly though, recovering with step reactional strategies. CGA for safety  Stairs            Wheelchair Mobility     Tilt Bed    Modified Rankin (Stroke Patients Only)       Balance Overall balance assessment: Mild deficits observed, not formally tested                                           Pertinent Vitals/Pain Pain Assessment Pain Assessment: Faces Faces Pain Scale: Hurts a little bit Pain Location: back with Gilberto Better testing Pain Descriptors / Indicators: Discomfort, Guarding, Grimacing Pain Intervention(s): Limited activity within patient's tolerance, Monitored during session, Repositioned    Home Living Family/patient expects to be discharged to:: Private residence Living Arrangements: Spouse/significant  other Available Help at Discharge: Family Type of Home: House Home Access: Stairs to enter Entrance Stairs-Rails: Right Entrance Stairs-Number of Steps: 4   Home Layout: One level Home Equipment: None      Prior Function Prior Level of Function : Independent/Modified Independent             Mobility Comments: No AD ADLs Comments: Does not drive due to peripheral neuropathy in feet, husband drives     Extremity/Trunk Assessment   Upper Extremity Assessment Upper Extremity Assessment: Right hand dominant;LUE deficits/detail (R UE strength, sensation, and & coordination intact and WFL) LUE Deficits / Details: finger to nose and then to PT's finger was incoordinated with pt overshooting target or having difficulty initiating movement at times, but able to alternate pronation/supination rapidly without issues; L UE strength WFL grossly 4+ to 5/5, symmetrical to R; denied numbness/tingling LUE Sensation: WNL LUE Coordination: decreased gross motor;decreased fine motor    Lower Extremity Assessment Lower Extremity Assessment: RLE deficits/detail;LLE deficits/detail RLE Deficits / Details: Grossly 4+ to 5, WFL strength; incoordinated with rapid alternating movements (DF then PF) bil, heel to shin intact grossly; hx of peripheral neuropathy, denies acute changes LLE Deficits / Details: Grossly 4+ to 5, WFL strength; incoordinated with rapid alternating movements (DF then PF) bil, heel to shin intact grossly; hx of peripheral neuropathy, denies acute changes    Cervical / Trunk Assessment Cervical / Trunk Assessment: Normal  Communication   Communication Communication: No apparent difficulties  Cognition Arousal: Alert Behavior During Therapy: WFL for tasks assessed/performed Overall Cognitive Status: Within Functional Limits for tasks assessed                                 General Comments: Pt seemed a little anxious        General Comments General comments  (skin integrity, edema, etc.): educated pt on her risk for falls with dynamic gait challenges and to slow her gait as needed when turning her head to ensure her safety, she verbalized understanding; educated pt on HEP handout provided with access Code: [email or web address] to address deficits with saccades and smooth pursuits; HR ranged 58-115 bpm    Exercises     Assessment/Plan    PT Assessment Patient needs continued PT services  PT Problem List Decreased balance;Decreased  coordination;Decreased mobility;Decreased activity tolerance;Cardiopulmonary status limiting activity;Impaired sensation       PT Treatment Interventions DME instruction;Gait training;Stair training;Functional mobility training;Therapeutic activities;Balance training;Therapeutic exercise;Neuromuscular re-education;Patient/family education    PT Goals (Current goals can be found in the Care Plan section)  Acute Rehab PT Goals Patient Stated Goal: to get better PT Goal Formulation: With patient/family Time For Goal Achievement: 02/06/24 Potential to Achieve Goals: Good    Frequency Min 1X/week     Co-evaluation               AM-PAC PT "6 Clicks" Mobility  Outcome Measure Help needed turning from your back to your side while in a flat bed without using bedrails?: None Help needed moving from lying on your back to sitting on the side of a flat bed without using bedrails?: None Help needed moving to and from a bed to a chair (including a wheelchair)?: A Little Help needed standing up from a chair using your arms (e.g., wheelchair or bedside chair)?: A Little Help needed to walk in hospital room?: A Little Help needed climbing 3-5 steps with a railing? : A Little 6 Click Score: 20    End of Session Equipment Utilized During Treatment: Gait belt Activity Tolerance: Patient tolerated treatment well Patient left: in chair;with call bell/phone within reach;with chair alarm set;with family/visitor present Nurse  Communication: Mobility status;Other (comment) (HR - NT) PT Visit Diagnosis: Unsteadiness on feet (R26.81);Other abnormalities of gait and mobility (R26.89);Difficulty in walking, not elsewhere classified (R26.2);Dizziness and giddiness (R42)    Time: 1610-9604 PT Time Calculation (min) (ACUTE ONLY): 40 min   Charges:   PT Evaluation $PT Eval Low Complexity: 1 Low PT Treatments $Therapeutic Activity: 23-37 mins PT General Charges $$ ACUTE PT VISIT: 1 Visit         Virgil Benedict, PT, DPT Acute Rehabilitation Services  Office: [phone]   Bettina Gavia 01/23/2024, 2:14 PM

## 2024-01-23 NOTE — Progress Notes (Signed)
TRH night cross cover note:   The patient is reporting 2 to 3 days of right upper extremity discomfort, swelling, along the anterior aspect of the right upper extremity, just proximal to the cubital fossa.  No associated any acute focal weakness, erythema.  I evaluated the patient at bedside, noted the right upper extremity to appear grossly swollen along the anterior aspect of the right upper extremity proximal to the cubital fossa relative to corresponding aspect of the left upper extremity.  Bilateral upper extremities appear warm, well perfused, and neurovascularly intact.  Will pursue venous ultrasound of the right upper extremity to evaluate for any underlying DVT.  I discussed this plan with the patient, and she conveyed that she is amenable to proceeding with it.    [name], DO Hospitalist

## 2024-01-23 NOTE — Progress Notes (Signed)
Patient claimed a "tingling" feeling on both feet. She described it as "it looks like I can't feel it and slowly I can. It feels like my feet were resting or sleeping and starts to woke up." RN did an assessment and patient able to wiggle her toes, move her both feet, and feels sensations. Will continue to monitor. Call bell placed within patient's reach.

## 2024-01-23 NOTE — Progress Notes (Addendum)
TR[facility] night cross cover note:   I was notified by RN that relative to the patient's baseline heart rates in the 40s to 50s, and her heart rate recently increased to 118 on telemetry, with blood pressure 166/78.  With this increase in heart rate, the patient noted some mild left-sided chest discomfort that she described as fullness which was associated with palpitations.   Heart rate spontaneously decreased back to her baseline heart rates have been 50s, with ensuing blood pressure 165/79.  This decrease was associated with resolution of her chest discomfort.  She is asymptomatic at this time.  Other vital signs appear stable at this time, including afebrile, with oxygen saturation continue to be in the high 90s to 100% on room air.  Per brief chart review, this points of elevated heart rate associated with some chest discomfort and palpitations appears similar to that with which she presented to the med center yesterday.  It is noted that she has subsequently undergone VQ scan, which showed no evidence of pulmonary embolism.  potassium level this morning is 4.0.  Will check updated EKG as well as chest x-ray.     [name], DO Hospitalist

## 2024-01-23 NOTE — Evaluation (Signed)
Clinical/Bedside Swallow Evaluation Patient Details  Name: Donna Baldwin MRN: [id number] Date of Birth: 05-27-1941  Today's Date: 01/23/2024 Time: SLP Start Time (ACUTE ONLY): 1618 SLP Stop Time (ACUTE ONLY): 1631 SLP Time Calculation (min) (ACUTE ONLY): 13 min  Past Medical History:  Past Medical History:  Diagnosis Date   Acid reflux    Anemia    Anxiety    Back pain    GERD (gastroesophageal reflux disease)    Hyperlipidemia    Hypertension    Hypertensive heart disease without heart failure    Thrombocytopenia (HCC)    Vitamin D deficiency    Past Surgical History:  Past Surgical History:  Procedure Laterality Date   ESOPHAGEAL MANOMETRY N/A 03/15/2020   Procedure: ESOPHAGEAL MANOMETRY (EM);  Surgeon: Charlott Rakes, MD;  Location: [facility];  Service: Endoscopy;  Laterality: N/A;   EYE SURGERY     HELLER MYOTOMY N/A 04/18/2020   Procedure: LAPAROSCOPIC HELLER MYOTOMY WITH UPPER ENDOSCOPY;  Surgeon: Kinsinger, De Blanch, MD;  Location: [facility];  Service: General;  Laterality: N/A;   PH IMPEDANCE STUDY N/A 03/15/2020   Procedure: PH IMPEDANCE STUDY;  Surgeon: Charlott Rakes, MD;  Location: [facility];  Service: Endoscopy;  Laterality: N/A;   SHOULDER SURGERY Bilateral    TONSILLECTOMY     HPI:  Donna Baldwin is an 83 yo female presenting to ED 1/23 with erratic heart palpitations. PMH includes HTN, HLD, anxiety, anemia, GERD    Assessment / Plan / Recommendation  Clinical Impression  Pt reports a history of peristalsis, which she manages by taking pills whole and intermittently blending POs. Observed with trials of thin liquids, purees, and solids without overt s/s of oropharyngeal dysphagia or aspiration. She reported a globus sensation and had frequent eructation, which she states is similar to baseline. Discussed options for diet modification with pt stating preference to continue current diet so she is allowed to make decisions regarding manageable  POs. She demonstrated extensive knowledge re: esophageal precautions. Recommend continuing regular texture solids with thin liquids. No further SLP f/u is needed. SLP Visit Diagnosis: Dysphagia, unspecified (R13.10)    Aspiration Risk  Mild aspiration risk    Diet Recommendation Regular;Thin liquid    Liquid Administration via: Cup;Straw Medication Administration: Crushed with puree Supervision: Patient able to self feed Compensations: Slow rate;Small sips/bites;Follow solids with liquid Postural Changes: Seated upright at 90 degrees;Remain upright for at least 30 minutes after po intake    Other  Recommendations Oral Care Recommendations: Oral care BID    Recommendations for follow up therapy are one component of a multi-disciplinary discharge planning process, led by the attending physician.  Recommendations may be updated based on patient status, additional functional criteria and insurance authorization.  Follow up Recommendations No SLP follow up      Assistance Recommended at Discharge    Functional Status Assessment Patient has not had a recent decline in their functional status  Frequency and Duration            Prognosis Prognosis for improved oropharyngeal function: Good Barriers to Reach Goals: Time post onset      Swallow Study   General HPI: Donna Baldwin is an 83 yo female presenting to ED 1/23 with erratic heart palpitations. PMH includes HTN, HLD, anxiety, anemia, GERD Type of Study: Bedside Swallow Evaluation Previous Swallow Assessment: none in chart Diet Prior to this Study: Regular;Thin liquids (Level 0) Temperature Spikes Noted: No Respiratory Status: Room air History of Recent Intubation: No Behavior/Cognition: Alert;Cooperative  Oral Cavity Assessment: Within Functional Limits Oral Care Completed by SLP: No Oral Cavity - Dentition: Adequate natural dentition Vision: Functional for self-feeding Self-Feeding Abilities: Able to feed  self Patient Positioning: Upright in bed Baseline Vocal Quality: Normal Volitional Cough: Strong Volitional Swallow: Able to elicit    Oral/Motor/Sensory Function Overall Oral Motor/Sensory Function: Within functional limits   Ice Chips Ice chips: Not tested   Thin Liquid Thin Liquid: Within functional limits Presentation: Straw;Self Fed    Nectar Thick Nectar Thick Liquid: Not tested   Honey Thick Honey Thick Liquid: Not tested   Puree Puree: Within functional limits Presentation: Spoon;Self Fed   Solid     Solid: Within functional limits Presentation: Self Fed      Gwynneth Aliment, M.A., CF-SLP Speech Language Pathology, Acute Rehabilitation Services  Secure Chat preferred [phone]  01/23/2024,4:57 PM

## 2024-01-24 DIAGNOSIS — R002 Palpitations: Secondary | ICD-10-CM | POA: Diagnosis not present

## 2024-01-24 LAB — MAGNESIUM: Magnesium: 2 mg/dL (ref 1.7–2.4)

## 2024-01-24 LAB — BASIC METABOLIC PANEL
Anion gap: 8 (ref 5–15)
BUN: 12 mg/dL (ref 8–23)
CO2: 26 mmol/L (ref 22–32)
Calcium: 9.3 mg/dL (ref 8.9–10.3)
Chloride: 106 mmol/L (ref 98–111)
Creatinine, Ser: 0.75 mg/dL (ref 0.44–1.00)
GFR, Estimated: >60 mL/min (ref >60)
Glucose, Bld: 95 mg/dL (ref 70–99)
Potassium: 3.4 mmol/L — ABNORMAL LOW (ref 3.5–5.1)
Sodium: 140 mmol/L (ref 135–145)

## 2024-01-24 LAB — CBC
HCT: 35.8 % — ABNORMAL LOW (ref 36.0–46.0)
Hemoglobin: 11.2 g/dL — ABNORMAL LOW (ref 12.0–15.0)
MCH: 22.8 pg — ABNORMAL LOW (ref 26.0–34.0)
MCHC: 31.3 g/dL (ref 30.0–36.0)
MCV: 72.8 fL — ABNORMAL LOW (ref 80.0–100.0)
Platelets: 105 10*3/uL — ABNORMAL LOW (ref 150–400)
RBC: 4.92 MIL/uL (ref 3.87–5.11)
RDW: 14.6 % (ref 11.5–15.5)
WBC: 2.7 10*3/uL — ABNORMAL LOW (ref 4.0–10.5)
nRBC: 0 % (ref 0.0–0.2)

## 2024-01-24 LAB — PHOSPHORUS: Phosphorus: 4.4 mg/dL (ref 2.5–4.6)

## 2024-01-24 MED ORDER — AMLODIPINE BESYLATE 5 MG PO TABS: 5.0000 mg | ORAL_TABLET | Freq: Every day | ORAL | 1 refills | Status: DC

## 2024-01-24 NOTE — Plan of Care (Signed)

## 2024-01-24 NOTE — Progress Notes (Signed)
Mobility Specialist Progress Note:    01/24/24 1544  Mobility  Activity Ambulated with assistance in hallway  Level of Assistance Contact guard assist, steadying assist  Assistive Device Front wheel walker  Distance Ambulated (ft) 300 ft  Activity Response Tolerated well  Mobility Referral Yes  Mobility visit 1 Mobility  Mobility Specialist Start Time (ACUTE ONLY) 1500  Mobility Specialist Stop Time (ACUTE ONLY) 1517  Mobility Specialist Time Calculation (min) (ACUTE ONLY) 17 min   Received pt in bed having no complaints and agreeable to mobility. Pt was asymptomatic throughout ambulation, no dizziness and returned to room w/o fault. Left seated EOB w/ call bell in reach and all needs met.   Thompson Grayer Mobility Specialist  Please contact vis Secure Chat or  Rehab Office [phone]

## 2024-01-24 NOTE — Progress Notes (Signed)
Nothing new to add today we will continue to follow the patient peripherally

## 2024-01-24 NOTE — Discharge Summary (Signed)
Physician Discharge Summary  Patient ID: Donna Baldwin MRN: [id number] DOB/AGE: 02/14/1941 83 y.o.  Admit date: 01/21/2024 Discharge date: 01/24/2024  Admission Diagnoses:  Discharge Diagnoses:  Principal Problem:   Palpitations Active Problems:   Thrombocytopenia (HCC)   Elevated d-dimer   Diastolic dysfunction   PVD (peripheral vascular disease) (HCC)   Leukopenia   Anxiety   Weight loss   Discharged Condition: stable  Hospital Course:  Patient is an 83 year old female with past medical history significant for hypertension, hyperlipidemia anxiety, anemia, and GERD.  Patient presented to the hospital with erratic heart palpitations.   At [facility] patient was noted to be afebrile with heart rates ranging from 45-1 33, respirations 12-33, blood pressures 113/77 187/85, and O2 saturations maintained on room air. Labs noted WBC 3.2, MCV 73.9, MCH 22.6 platelets 111, D-dimer elevated 1.07, high-sensitivity troponins negative x 2. CT angiogram of the chest was to be obtained, but patient noted to have anaphylaxis to contrast dye.   # Palpitations -Acute -Patient presented with reports of heart rates ranging anywhere from 39-130s since 01/16/2024.  During episodes patient reported feeling lightheaded as though she could pass out.   -High-sensitivity troponins were negative x 2.   -Patient previously wore a Holter monitor back in 2022 which noted 2 episodes of atrial tachycardia with longest lasting 12.6 seconds, and single sinus pause lasting 3.1 seconds.   -Continued to monitor on telemetry -Echo as documented below. -Troponin negative x 2 -TSH 2.1 (normal) -Cardiology to arrange outpatient telemetry monitoring -Cardiology consulted to assist in directing patient's care.   Elevated D-dimer -Acute.   -D-dimer 1.07.  Patient reports history of anaphylaxis with IV contrast dye. - VQ scan negative for PE   Diastolic dysfunction Patient did not appear to be acutely fluid  overloaded  -Last echocardiogram noted EF to be approximately 54% with grade 2 diastolic dysfunction when checked back in 06/2021. -TTE as below.   Peripheral vascular disease -Previous ultrasound from 05/12/2023 noted resting ABI on the right in the moderate range arterial occlusive disease with segmental exam demonstrating SFA occlusion.   -Left leg noted resting ABI in the mild range with segmental exam demonstrating popliteal artery/tibial artery occlusive disease.  Previous allergies include pitavastatin which caused facial swelling. -ABI: Right: Resting right ankle-brachial index indicates moderate right lower extremity arterial disease. The right toe-brachial index is abnormal.   Left: Resting left ankle-brachial index indicates moderate left lower extremity arterial disease. The left toe-brachial index is abnormal.   -Vascular surgery as an outpatient   Leukopenia -Chronic.   -WBC 3.2 and similar to previous.  Patient otherwise noted to be afebrile. -Continue to monitor.   -PCP may consider outpatient hematology oncology for further workup     Thrombocytopenia -Chronic.  Platelet count 111 which appears to be stable. -Continue to monitor -Follow-up with PCP.  Repeat CBC after 1 to 2 weeks and follow-up with hematology if no resolution of leukopenia and thrombocytopenia.   Anxiety   Weight loss -Follow-up TSH      Consults: cardiology  Significant Diagnostic Studies:  Echo revealed: 1. Left ventricular ejection fraction, by estimation, is 60 to 65%. The  left ventricle has normal function. The left ventricle has no regional  wall motion abnormalities. Left ventricular diastolic parameters are  consistent with Grade I diastolic  dysfunction (impaired relaxation).   2. Right ventricular systolic function is normal. The right ventricular  size is normal. There is normal pulmonary artery systolic pressure. The  estimated right ventricular systolic  pressure is 20.6 mmHg.    3. The mitral valve is grossly normal. Trivial mitral valve  regurgitation. No evidence of mitral stenosis.   4. The aortic valve is tricuspid. Aortic valve regurgitation is not  visualized. No aortic stenosis is present.   5. The inferior vena cava is normal in size with greater than 50%  respiratory variability, suggesting right atrial pressure of 3 mmHg.     NUCLEAR MEDICINE PERFUSION LUNG SCAN   TECHNIQUE: Perfusion images were obtained in multiple projections after intravenous injection of radiopharmaceutical.   Ventilation scans intentionally deferred if perfusion scan and chest x-ray adequate for interpretation.   RADIOPHARMACEUTICALS:  4.0 mCi Tc-24m MAA IV   COMPARISON:  Chest x-ray 01/21/2024   FINDINGS: Planar imaging of the lungs was performed in multiple projections during the perfusion exam. There are no wedge-shaped perfusion defects. Normal perfusion throughout the lung parenchyma.   IMPRESSION: 1. No evidence of pulmonary embolus.    PORTABLE CHEST 1 VIEW:   COMPARISON:  Chest radiograph dated 08/05/2022.   FINDINGS: No focal consolidation, pleural effusion, or pneumothorax. Stable cardiac silhouette. Atherosclerotic calcification of the aortic arch. No acute osseous pathology.   IMPRESSION: No active disease.     Electronically Signed   By: Elgie Collard M.D.   On: 01/21/2024 16:20   Treatments:  Holter monitor on discharge.  Discharge Exam: Blood pressure 132/82, pulse (!) 53, temperature (!) 97 F (36.1 C), temperature source Oral, resp. rate (!) 21, height 5\' 5"  (1.651 m), weight 47 kg, SpO2 100%.   Disposition: Discharge disposition: 01-Home or Self Care       Discharge Instructions     Diet - low sodium heart healthy   Complete by: As directed    Increase activity slowly   Complete by: As directed       Allergies as of 01/24/2024       Reactions   Beef Allergy Anaphylaxis   Beef-derived Drug Products Anaphylaxis    Codeine Anxiety, Hives   Iodine Anaphylaxis   Iodine-131 Anaphylaxis   Ivp Dye [iodinated Contrast Media] Anaphylaxis, Other (See Comments)   Latex Rash   Morphine And Codeine Other (See Comments)   Tremors, increased heart rate, excitement, confusion per pt   Penicillins Hives   Has patient had a PCN reaction causing immediate rash, facial/tongue/throat swelling, SOB or lightheadedness with hypotension: Yes Has patient had a PCN reaction causing severe rash involving mucus membranes or skin necrosis: Yes Has patient had a PCN reaction that required hospitalization: Yes Has patient had a PCN reaction occurring within the last 10 years: No If all of the above answers are "NO", then may proceed with Cephalosporin use.   Sertraline Other (See Comments)   Numbness in mouth and hyperactivity   Shellfish Allergy Hives   Solu-medrol [methylprednisolone] Other (See Comments)   Numbness and tingling in mouth   Sulfa Antibiotics Hives, Rash   Sulfasalazine Hives, Rash   Barbiturates Other (See Comments)   Excitement   Bee Pollen Hives   Bee Venom Hives   Bioflavonoid Products    Other reaction(s): Other (See Comments) Nuts, strawberries, bicarbonate of soda, Nuts, strawberries, bicarbonate of soda,   Diovan [valsartan] Nausea Only   Gabapentin Other (See Comments)   Visual disturbance   Other Palpitations, Other (See Comments), Rash   NARCOTIC ANALGESICS-TREMORS, INCREASED HEART RATE Hmg-Coa Reductase Inhibitors - intolerance unknown   Oxycodone Swelling, Rash, Cough   Pitavastatin Other (See Comments), Swelling   Facial swelling Other Reaction(s):  angioedema, angioedema, angioedema   Promethazine-dm Other (See Comments)   "Felt funny"   Tape Rash, Other (See Comments)   Red blotches   Wellness Essentials Blood Sugr [nutritional Supplements] Other (See Comments)   Nuts, strawberries, bicarbonate of soda,   Albuterol Other (See Comments)   Caused wheezing   Ciprofloxacin Other (See  Comments)   Crestor [rosuvastatin] Other (See Comments)   Diltiazem Hcl    Other reaction(s): foot sores   Ezetimibe    Other reaction(s): myalgias   Famotidine Other (See Comments)   Unknown reaction   Oxycodone Hcl    Other reaction(s): hives, rash   Pred Forte [prednisolone Acetate] Other (See Comments)   Patient is seeing odd color and images while using this   Welchol [colesevelam]    Other reaction(s): myalgias   Celebrex [celecoxib] Swelling, Other (See Comments)   Swelling and numbness (mouth)   Lanolin Itching   Mobic [meloxicam] Swelling, Other (See Comments)   Swelling and numbness in mouth   Nickel Rash   Nitrates, Organic Rash, Other (See Comments)   Chest tightness also   Petrolatum Rash, Other (See Comments)   Soy Allergy (do Not Select) Rash   Strawberry Extract Rash        Medication List     STOP taking these medications    hydrOXYzine 10 MG tablet Commonly known as: ATARAX   LORazepam 0.5 MG tablet Commonly known as: ATIVAN   mometasone 0.1 % cream Commonly known as: ELOCON   neomycin-bacitracin-polymyxin [id number] Oint   NIFEdipine 30 MG 24 hr tablet Commonly known as: ADALAT CC   triamcinolone cream 0.1 % Commonly known as: KENALOG   TUMS CHEWY BITES PO   VITAMIN B-12 PO   Vitamin D (Ergocalciferol) 1.25 MG (50000 UNIT) Caps capsule Commonly known as: DRISDOL       TAKE these medications    amLODipine 5 MG tablet Commonly known as: NORVASC Take 1 tablet (5 mg total) by mouth daily. Start taking on: January 25, 2024   aspirin EC 81 MG tablet Take 81 mg by mouth as needed.   cetirizine 10 MG tablet Commonly known as: ZYRTEC Take 10 mg by mouth daily as needed for allergies.   EPINEPHrine 0.3 mg/0.3 mL Soaj injection Commonly known as: EpiPen 2-Pak Inject 0.3 mg into the muscle as needed for anaphylaxis.   Repatha SureClick 140 MG/ML Soaj Generic drug: Evolocumab Inject 140 mg into the skin every 14 (fourteen)  days.        Time spent: 35 minutes.  SignedBarnetta Chapel 01/24/2024, 3:44 PM

## 2024-01-24 NOTE — TOC Transition Note (Signed)
Transition of Care [facility]) - Discharge Note   Patient Details  Name: Donna Baldwin MRN: [id number] Date of Birth: [birth year]  Transition of Care [facility]) CM/SW Contact:  Ronny Bacon, RN Phone Number: 01/24/2024, 4:11 PM   Clinical Narrative:   Patient is being discharged today. Orders for [facility] PT/OT noted and arranged through [facility] with [facility]    Final next level of care: Home w Home Health Services Barriers to Discharge: No Barriers Identified   Patient Goals and CMS Choice Patient states their goals for this hospitalization and ongoing recovery are:: return home   Choice offered to / list presented to : NA      Discharge Placement                       Discharge Plan and Services Additional resources added to the After Visit Summary for   In-house Referral: NA Discharge Planning Services: CM Consult Post Acute Care Choice: NA          DME Arranged: N/A DME Agency: NA       HH Arranged: PT, OT HH Agency: [facility] Date [facility] Agency Contacted: 01/24/24 Time HH Agency Contacted: 1608 Representative spoke with at [facility] Agency: Kandee Keen  Social Drivers of Health (SDOH) Interventions SDOH Screenings   Food Insecurity: No Food Insecurity (01/22/2024)  Housing: Low Risk  (01/22/2024)  Transportation Needs: No Transportation Needs (01/22/2024)  Utilities: Not At Risk (01/22/2024)  Social Connections: Socially Integrated (01/23/2024)  Tobacco Use: Low Risk  (01/21/2024)     Readmission Risk Interventions     No data to display

## 2024-01-24 NOTE — Progress Notes (Signed)
Discharge instructions reviewed with patient and/or caregiver.  Verbalized understanding.  Questions asked and answered.  No further needs at this time.   IV removed, tele removed and CM notified of pt discharge.

## 2024-01-26 LAB — VAS US ABI WITH/WO TBI
Left ABI: 0.78
Right ABI: 0.55

## 2024-01-29 NOTE — Telephone Encounter (Signed)
Patient contacted regarding discharge from [facility] on 01/24/2024.  Patient understands to follow up with provider Dr. Jacinto Halim on 02/02/24 at 10:00 am at [facility] Patient understands discharge instructions? yes Patient understands medications and regiment? yes Patient understands to bring all medications to this visit? yes

## 2024-01-30 DIAGNOSIS — R002 Palpitations: Secondary | ICD-10-CM | POA: Diagnosis not present

## 2024-02-01 NOTE — Progress Notes (Unsigned)
+ Cardiology Office Note:  .   Date:  02/02/2024  ID:  Donna Baldwin, DOB 03-Apr-1941, MRN [id number] PCP: Laurann Montana, MD   HeartCare Providers Cardiologist:  Yates Decamp, MD   History of Present Illness: Donna Baldwin   Donna Baldwin is a 83 y.o. AAF with no significant coronary disease but has mildly elevated coronary calcium score with noncritical distal left main minimal disease by coronary CTA in 2020, coronary calcium score in the 50th percentile, difficult to control hypertension, asymptomatic bilateral carotid stenosis, severe peripheral arterial disease especially involving right lower extremity worse on the left, being followed by vascular surgery, multiple medication allergies and intolerance of statins, achalasia cardia SP surgery in 2019, severe vitamin D deficiency, chronic thrombocytopenia felt to be ITP asymptomatic.   Hospital on 01/21/2024 with hypertension, palpitations.  She was discharged home after discontinuing Procardia XL and switch to amlodipine 5 mg daily.  Patient is presently wearing an event monitor.  States that since being on amlodipine blood pressure has been under excellent control.  She still continues to have palpitations but no other symptoms of dizziness or syncope.  She denies symptoms of claudication.     Labs   Lab Results  Component Value Date   NA 140 01/24/2024   K 3.4 (L) 01/24/2024   CO2 26 01/24/2024   GLUCOSE 95 01/24/2024   BUN 12 01/24/2024   CREATININE 0.75 01/24/2024   CALCIUM 9.3 01/24/2024   GFR 84.72 12/29/2012   EGFR 72 10/08/2023   GFRNONAA >60 01/24/2024      Latest Ref Rng & Units 01/24/2024    2:36 AM 01/23/2024    2:53 AM 01/21/2024    4:25 PM  BMP  Glucose 70 - 99 mg/dL 95  92  91   BUN 8 - 23 mg/dL 12  11  12    Creatinine 0.44 - 1.00 mg/dL 4.09  8.11  9.14   Sodium 135 - 145 mmol/L 140  141  139   Potassium 3.5 - 5.1 mmol/L 3.4  4.0  4.1   Chloride 98 - 111 mmol/L 106  107  102   CO2 22 - 32 mmol/L 26  26   23    Calcium 8.9 - 10.3 mg/dL 9.3  9.4  9.9       Latest Ref Rng & Units 01/24/2024    2:36 AM 01/23/2024    2:53 AM 01/21/2024    4:25 PM  CBC  WBC 4.0 - 10.5 K/uL 2.7  3.0  3.2   Hemoglobin 12.0 - 15.0 g/dL 78.2  95.6  21.3   Hematocrit 36.0 - 46.0 % 35.8  35.8  39.3   Platelets 150 - 400 K/uL 105  108  111    External Labs:  [facility] Lab work from PCP:  Labs 09/29/2023: Cholesterol 243, triglycerides 58, HDL 88, LDL 145.      Review of Systems  Cardiovascular:  Positive for palpitations. Negative for chest pain, claudication, dyspnea on exertion, leg swelling and syncope.    Physical Exam:   VS:  BP (!) 140/78 (BP Location: Left Arm, Patient Position: Sitting, Cuff Size: Normal)   Pulse 89   Resp 16   Ht 5\' 5"  (1.651 m)   Wt 106 lb 12.8 oz (48.4 kg)   SpO2 99%   BMI 17.77 kg/m    Wt Readings from Last 3 Encounters:  02/02/24 106 lb 12.8 oz (48.4 kg)  01/24/24 103 lb 9.9 oz (47 kg)  10/20/23 108 lb  12.8 oz (49.4 kg)     Physical Exam Neck:     Vascular: No carotid bruit or JVD.  Cardiovascular:     Rate and Rhythm: Normal rate and regular rhythm.     Pulses: Intact distal pulses.          Popliteal pulses are 0 on the right side and 2+ on the left side.       Dorsalis pedis pulses are 0 on the right side and 1+ on the left side.       Posterior tibial pulses are 0 on the right side and 0 on the left side.     Heart sounds: Normal heart sounds. No murmur heard.    No gallop.  Pulmonary:     Effort: Pulmonary effort is normal.     Breath sounds: Normal breath sounds.  Abdominal:     General: Bowel sounds are normal.     Palpations: Abdomen is soft.  Musculoskeletal:     Right lower leg: No edema.     Left lower leg: No edema.     Studies Reviewed: .    Segmental pressure 05/12/2023: Right: Resting ankle brachial index: 0.48  Resting ABI in the moderate range arterial occlusive disease. The segmental exam demonstrates SFA occlusion. Left: Resting ankle brachial  index: 0.72 Resting ABI in the mild range of arterial occlusive disease. Segmental exam demonstrates popliteal artery/tibial arterial occlusive disease.  Carotid artery duplex 11/14/2022: Right Carotid: Velocities in the right ICA are consistent with a 1-39% stenosis.  Left Carotid: Velocities in the left ICA are consistent with a 40-59% stenosis.  Vertebrals: Bilateral vertebral arteries demonstrate antegrade flow.  Subclavians: Normal flow hemodynamics were seen in bilateral subclavian arteries.    ABI 01/23/2024:  Right: Resting right ankle-brachial index indicates moderate right lower extremity arterial disease. The right toe-brachial index is abnormal.  Left: Resting left ankle-brachial index indicates moderate left lower extremity arterial disease. The left toe-brachial index is abnormal.   ECHOCARDIOGRAM COMPLETE 01/22/2024  1. Left ventricular ejection fraction, by estimation, is 60 to 65%. The left ventricle has normal function. The left ventricle has no regional wall motion abnormalities. Left ventricular diastolic parameters are consistent with Grade I diastolic dysfunction (impaired relaxation). 2. Right ventricular systolic function is normal. The right ventricular size is normal. There is normal pulmonary artery systolic pressure. The estimated right ventricular systolic pressure is 20.6 mmHg.  EKG:    EKG Interpretation Date/Time:  Monday February 02 2024 10:09:49 EST Ventricular Rate:  85 PR Interval:  138 QRS Duration:  66 QT Interval:  382 QTC Calculation: 454 R Axis:   -73  Text Interpretation: EKG 02/02/2024: Normal sinus rhythm at rate of 85 bpm, left anterior fascicular block.  Incomplete right bundle branch block.  Nonspecific T abnormality.  Compared to 01/23/2024 inferior and anterior nonspecific T abnormality new. Confirmed by Delrae Rend [phone]) on 02/02/2024 10:37:55 AM    Medications and allergies    Allergies  Allergen Reactions   Beef Allergy  Anaphylaxis   Beef-Derived Drug Products Anaphylaxis   Codeine Anxiety and Hives   Iodine Anaphylaxis   Iodine-131 Anaphylaxis   Ivp Dye [Iodinated Contrast Media] Anaphylaxis and Other (See Comments)   Latex Rash   Morphine And Codeine Other (See Comments)    Tremors, increased heart rate, excitement, confusion per pt   Penicillins Hives    Has patient had a PCN reaction causing immediate rash, facial/tongue/throat swelling, SOB or lightheadedness with hypotension: Yes Has patient had a  PCN reaction causing severe rash involving mucus membranes or skin necrosis: Yes Has patient had a PCN reaction that required hospitalization: Yes Has patient had a PCN reaction occurring within the last 10 years: No If all of the above answers are "NO", then may proceed with Cephalosporin use.    Sertraline Other (See Comments)    Numbness in mouth and hyperactivity   Shellfish Allergy Hives   Solu-Medrol [Methylprednisolone] Other (See Comments)    Numbness and tingling in mouth   Sulfa Antibiotics Hives and Rash   Sulfasalazine Hives and Rash   Barbiturates Other (See Comments)    Excitement    Bee Pollen Hives   Bee Venom Hives   Bioflavonoid Products     Other reaction(s): Other (See Comments) Nuts, strawberries, bicarbonate of soda, Nuts, strawberries, bicarbonate of soda,   Diovan [Valsartan] Nausea Only   Gabapentin Other (See Comments)    Visual disturbance   Other Palpitations, Other (See Comments) and Rash    NARCOTIC ANALGESICS-TREMORS, INCREASED HEART RATE Hmg-Coa Reductase Inhibitors - intolerance unknown    Oxycodone Swelling, Rash and Cough   Pitavastatin Other (See Comments) and Swelling    Facial swelling  Other Reaction(s): angioedema, angioedema, angioedema   Promethazine-Dm Other (See Comments)    "Felt funny"   Tape Rash and Other (See Comments)    Red blotches    Wellness Essentials Blood Sugr [Nutritional Supplements] Other (See Comments)    Nuts,  strawberries, bicarbonate of soda,   Albuterol Other (See Comments)    Caused wheezing   Ciprofloxacin Other (See Comments)   Crestor [Rosuvastatin] Other (See Comments)   Diltiazem Hcl     Other reaction(s): foot sores   Ezetimibe     Other reaction(s): myalgias   Famotidine Other (See Comments)    Unknown reaction   Oxycodone Hcl     Other reaction(s): hives, rash   Pred Forte [Prednisolone Acetate] Other (See Comments)    Patient is seeing odd color and images while using this   Welchol [Colesevelam]     Other reaction(s): myalgias   Celebrex [Celecoxib] Swelling and Other (See Comments)    Swelling and numbness (mouth)   Lanolin Itching   Mobic [Meloxicam] Swelling and Other (See Comments)    Swelling and numbness in mouth   Nickel Rash   Nitrates, Organic Rash and Other (See Comments)    Chest tightness also   Petrolatum Rash and Other (See Comments)   Soy Allergy (Do Not Select) Rash   Strawberry Extract Rash     Current Outpatient Medications:    aspirin EC 81 MG tablet, Take 81 mg by mouth as needed., Disp: , Rfl:    cetirizine (ZYRTEC) 10 MG tablet, Take 10 mg by mouth daily as needed for allergies., Disp: , Rfl:    EPINEPHrine (EPIPEN 2-PAK) 0.3 mg/0.3 mL IJ SOAJ injection, Inject 0.3 mg into the muscle as needed for anaphylaxis., Disp: 2 each, Rfl: 1   amLODipine (NORVASC) 5 MG tablet, Take 1 tablet (5 mg total) by mouth daily., Disp: 90 tablet, Rfl: 3   ASSESSMENT AND PLAN: .      ICD-10-CM   1. Palpitations  R00.2     2. Primary hypertension  I10 amLODipine (NORVASC) 5 MG tablet    3. PAD (peripheral artery disease) (HCC)  I73.9 EKG 12-Lead    4. Mixed hyperlipidemia  E78.2     5. Statin myopathy  G72.0    T46.6X5A     6. Asymptomatic stenosis of  left carotid artery  I65.22     1. Palpitations (Primary) Patient previously has had event monitoring and this had revealed PACs and PVCs and brief atrial tachycardia.  Presently wearing a monitor, will  reevaluate her symptoms once she turns in her monitor.  No atrial fibrillation was noted during hospitalization when she was admitted on 01/21/2024.  Patient does not want to try any beta-blockers right now states that with amlodipine she is feeling the best she has in quite a while and essentially asymptomatic except for brief palpitations.  2. Primary hypertension Upon presentation blood pressure was markedly elevated, still patient brings in home recordings which have been under excellent control in fact at times blood pressure has been low around 90 mmHg systolic.  Hence no med changes in the medications were done today.  If palpitations become an issue, we could certainly switch from amlodipine to verapamil.  For now patient states that she is feeling the best she has in quite a while and hence would like to continue present medical therapy.  - amLODipine (NORVASC) 5 MG tablet; Take 1 tablet (5 mg total) by mouth daily.  Dispense: 90 tablet; Refill: 3  3. PAD (peripheral artery disease) (HCC) Patient has severe peripheral arterial disease and asymptomatic left carotid artery stenosis, she has been unable to tolerate statins with clear-cut statin myalgias and statin myopathy.  We have tried to obtain Repatha in the past, I will make a referral for lipid clinic to see if he can assist her with obtaining Repatha.  Will also obtain carotid artery duplex as it is overdue.  I reviewed her ABI, fortunately ABI has remained stable over the past 2 years.  No tissue loss, she has still been walking about 2000 steps a day at home without any symptoms of claudication, continue present management.  Continue aspirin 81 mg daily.  As dictated above we will start her on Repatha soon.  - EKG 12-Lead  4. Mixed hyperlipidemia 2024 revealed LDL to be 145.  Goal LDL <70 in view of severe peripheral arterial disease. - AMB Referral to [facility] Pharm-D  5. Statin myopathy Please see above 6. Asymptomatic  stenosis of left carotid artery Carotid duplex ordered. - VAS US CAROTID; Future    Signed,  Yates Decamp, MD, [facility] 02/02/2024, 10:54 AM [facility] [address] [place], [place] [address] Phone: [phone]. Fax:  [phone]

## 2024-02-02 ENCOUNTER — Encounter: Payer: Self-pay | Admitting: Cardiology

## 2024-02-02 ENCOUNTER — Ambulatory Visit: Payer: HMO | Attending: Cardiology | Admitting: Cardiology

## 2024-02-02 VITALS — BP 140/78 | HR 89 | Resp 16 | Ht 65.0 in | Wt 106.8 lb

## 2024-02-02 DIAGNOSIS — I6522 Occlusion and stenosis of left carotid artery: Secondary | ICD-10-CM

## 2024-02-02 DIAGNOSIS — I10 Essential (primary) hypertension: Secondary | ICD-10-CM | POA: Diagnosis not present

## 2024-02-02 DIAGNOSIS — I739 Peripheral vascular disease, unspecified: Secondary | ICD-10-CM

## 2024-02-02 DIAGNOSIS — E782 Mixed hyperlipidemia: Secondary | ICD-10-CM

## 2024-02-02 DIAGNOSIS — T466X5A Adverse effect of antihyperlipidemic and antiarteriosclerotic drugs, initial encounter: Secondary | ICD-10-CM

## 2024-02-02 DIAGNOSIS — R002 Palpitations: Secondary | ICD-10-CM

## 2024-02-02 DIAGNOSIS — G720 Drug-induced myopathy: Secondary | ICD-10-CM

## 2024-02-02 MED ORDER — AMLODIPINE BESYLATE 5 MG PO TABS: 5.0000 mg | ORAL_TABLET | Freq: Every day | ORAL | 3 refills | Status: DC

## 2024-02-02 NOTE — Patient Instructions (Signed)
Medication Instructions:  Your physician recommends that you continue on your current medications as directed. Please refer to the Current Medication list given to you today.  *If you need a refill on your cardiac medications before your next appointment, please call your pharmacy*   Lab Work: none If you have labs (blood work) drawn today and your tests are completely normal, you will receive your results only by: MyChart Message (if you have MyChart) OR A paper copy in the mail If you have any lab test that is abnormal or we need to change your treatment, we will call you to review the results.   Testing/Procedures: Your physician has requested that you have a carotid duplex. This test is an ultrasound of the carotid arteries in your neck. It looks at blood flow through these arteries that supply the brain with blood. Allow one hour for this exam. There are no restrictions or special instructions.    Follow-Up: At [facility], you and your health needs are our priority.  As part of our continuing mission to provide you with exceptional heart care, we have created designated Provider Care Teams.  These Care Teams include your primary Cardiologist (physician) and Advanced Practice Providers (APPs -  Physician Assistants and Nurse Practitioners) who all work together to provide you with the care you need, when you need it.  We recommend signing up for the patient portal called "MyChart".  Sign up information is provided on this After Visit Summary.  MyChart is used to connect with patients for Virtual Visits (Telemedicine).  Patients are able to view lab/test results, encounter notes, upcoming appointments, etc.  Non-urgent messages can be sent to your provider as well.   To learn more about what you can do with MyChart, go to [email or web address].    Your next appointment:   12 month(s)  Provider:   Yates Decamp, MD     Other Instructions You have been referred to see the  Pharmacist in our office.  Please schedule new patient appointment

## 2024-02-06 ENCOUNTER — Ambulatory Visit (HOSPITAL_COMMUNITY)
Admission: RE | Admit: 2024-02-06 | Discharge: 2024-02-06 | Disposition: A | Discharge status: Home / Self Care | Payer: HMO | Source: Ambulatory Visit | Attending: Cardiology | Admitting: Cardiology

## 2024-02-06 DIAGNOSIS — I6522 Occlusion and stenosis of left carotid artery: Secondary | ICD-10-CM | POA: Diagnosis present

## 2024-02-08 ENCOUNTER — Encounter: Payer: Self-pay | Admitting: Cardiology

## 2024-02-08 NOTE — Progress Notes (Signed)
 Improvement in left carotid stenosis from 40 to 60% to the present <40%.  Continue present medical management.  I do not think she needs repeat study.

## 2024-02-09 ENCOUNTER — Other Ambulatory Visit: Payer: Medicare PPO

## 2024-02-09 DIAGNOSIS — K220 Achalasia of cardia: Secondary | ICD-10-CM | POA: Diagnosis not present

## 2024-02-09 DIAGNOSIS — R151 Fecal smearing: Secondary | ICD-10-CM | POA: Diagnosis not present

## 2024-02-09 DIAGNOSIS — R1310 Dysphagia, unspecified: Secondary | ICD-10-CM | POA: Diagnosis not present

## 2024-02-23 ENCOUNTER — Telehealth: Payer: Self-pay

## 2024-02-23 NOTE — Telephone Encounter (Signed)
 Micah Flesher APPROVAL # [id number] VALID THRU 02/10/24-05/10/24

## 2024-02-24 ENCOUNTER — Encounter: Payer: Self-pay | Admitting: Podiatry

## 2024-02-24 ENCOUNTER — Ambulatory Visit: Payer: HMO | Admitting: Podiatry

## 2024-02-24 VITALS — Ht 65.0 in | Wt 106.8 lb

## 2024-02-24 DIAGNOSIS — E119 Type 2 diabetes mellitus without complications: Secondary | ICD-10-CM

## 2024-02-24 DIAGNOSIS — M79675 Pain in left toe(s): Secondary | ICD-10-CM | POA: Diagnosis not present

## 2024-02-24 DIAGNOSIS — G609 Hereditary and idiopathic neuropathy, unspecified: Secondary | ICD-10-CM | POA: Diagnosis not present

## 2024-02-24 DIAGNOSIS — B351 Tinea unguium: Secondary | ICD-10-CM | POA: Diagnosis not present

## 2024-02-24 DIAGNOSIS — M79674 Pain in right toe(s): Secondary | ICD-10-CM | POA: Diagnosis not present

## 2024-02-25 ENCOUNTER — Ambulatory Visit (INDEPENDENT_AMBULATORY_CARE_PROVIDER_SITE_OTHER): Payer: HMO

## 2024-02-25 DIAGNOSIS — M2041 Other hammer toe(s) (acquired), right foot: Secondary | ICD-10-CM | POA: Diagnosis not present

## 2024-02-25 DIAGNOSIS — M2141 Flat foot [pes planus] (acquired), right foot: Secondary | ICD-10-CM | POA: Diagnosis not present

## 2024-02-25 DIAGNOSIS — M2042 Other hammer toe(s) (acquired), left foot: Secondary | ICD-10-CM | POA: Diagnosis not present

## 2024-02-25 DIAGNOSIS — M2142 Flat foot [pes planus] (acquired), left foot: Secondary | ICD-10-CM | POA: Diagnosis not present

## 2024-02-25 DIAGNOSIS — E119 Type 2 diabetes mellitus without complications: Secondary | ICD-10-CM

## 2024-02-26 NOTE — Progress Notes (Signed)

## 2024-02-27 NOTE — Progress Notes (Signed)
 Subjective:  Patient ID: Donna Baldwin, female    DOB: 08-Sep-1941,  MRN: [id number]  Donna Baldwin presents to clinic today for: at risk foot care. Patient has history of peripheral neuropathy and diabetes  and painful, elongated thickened toenails x 10 which are symptomatic when wearing enclosed shoe gear. This interferes with his/her daily activities.  Chief Complaint  Patient presents with   Nail Problem    Pt is here for [facility] unsure of last A1C PCP is Dr Cliffton Asters and LOV was in December.    PCP is Laurann Montana, MD.  Allergies  Allergen Reactions   Beef Allergy Anaphylaxis   Beef-Derived Drug Products Anaphylaxis   Codeine Anxiety and Hives   Iodine Anaphylaxis   Iodine-131 Anaphylaxis   Ivp Dye [Iodinated Contrast Media] Anaphylaxis and Other (See Comments)   Latex Rash   Morphine And Codeine Other (See Comments)    Tremors, increased heart rate, excitement, confusion per pt   Penicillins Hives    Has patient had a PCN reaction causing immediate rash, facial/tongue/throat swelling, SOB or lightheadedness with hypotension: Yes Has patient had a PCN reaction causing severe rash involving mucus membranes or skin necrosis: Yes Has patient had a PCN reaction that required hospitalization: Yes Has patient had a PCN reaction occurring within the last 10 years: No If all of the above answers are "NO", then may proceed with Cephalosporin use.    Sertraline Other (See Comments)    Numbness in mouth and hyperactivity   Shellfish Allergy Hives   Solu-Medrol [Methylprednisolone] Other (See Comments)    Numbness and tingling in mouth   Sulfa Antibiotics Hives and Rash   Sulfasalazine Hives and Rash   Barbiturates Other (See Comments)    Excitement    Bee Pollen Hives   Bee Venom Hives   Bioflavonoid Products     Other reaction(s): Other (See Comments) Nuts, strawberries, bicarbonate of soda, Nuts, strawberries, bicarbonate of soda,   Diovan [Valsartan] Nausea Only    Gabapentin Other (See Comments)    Visual disturbance   Other Palpitations, Other (See Comments) and Rash    NARCOTIC ANALGESICS-TREMORS, INCREASED HEART RATE Hmg-Coa Reductase Inhibitors - intolerance unknown    Oxycodone Swelling, Rash and Cough   Pitavastatin Other (See Comments) and Swelling    Facial swelling  Other Reaction(s): angioedema, angioedema, angioedema   Promethazine-Dm Other (See Comments)    "Felt funny"   Tape Rash and Other (See Comments)    Red blotches    Wellness Essentials Blood Sugr [Nutritional Supplements] Other (See Comments)    Nuts, strawberries, bicarbonate of soda,   Albuterol Other (See Comments)    Caused wheezing   Ciprofloxacin Other (See Comments)   Crestor [Rosuvastatin] Other (See Comments)   Diltiazem Hcl     Other reaction(s): foot sores   Ezetimibe     Other reaction(s): myalgias   Famotidine Other (See Comments)    Unknown reaction   Oxycodone Hcl     Other reaction(s): hives, rash   Pred Forte [Prednisolone Acetate] Other (See Comments)    Patient is seeing odd color and images while using this   Welchol [Colesevelam]     Other reaction(s): myalgias   Celebrex [Celecoxib] Swelling and Other (See Comments)    Swelling and numbness (mouth)   Lanolin Itching   Mobic [Meloxicam] Swelling and Other (See Comments)    Swelling and numbness in mouth   Nickel Rash   Nitrates, Organic Rash and Other (See Comments)  Chest tightness also   Petrolatum Rash and Other (See Comments)   Soy Allergy (Obsolete) Rash   Strawberry Extract Rash    Review of Systems: Negative except as noted in the HPI.  Objective: No changes noted in today's physical examination. There were no vitals filed for this visit.  Donna Baldwin is a pleasant 83 y.o. female WD, WN in NAD. AAO x 3.  Vascular Examination: Capillary refill time <3 seconds b/l LE. Palpable pedal pulses b/l LE. Digital hair sparse b/l. No pedal edema b/l. Skin temperature  gradient WNL b/l. No varicosities b/l. [name]  Dermatological Examination: Pedal skin with normal turgor, texture and tone b/l. No open wounds. No interdigital macerations b/l. Toenails 1-5 b/l thickened, discolored, dystrophic with subungual debris. There is pain on palpation to dorsal aspect of nailplates. No corns, calluses nor porokeratotic lesions noted..  Neurological Examination: Pt has subjective symptoms of neuropathy. Protective sensation intact 5/5 intact bilaterally with 10g monofilament b/l.  Musculoskeletal Examination: Muscle strength 5/5 to all lower extremity muscle groups bilaterally. HAV with bunion bilaterally and hammertoes 2-5 b/l.  Assessment/Plan: 1. Pain due to onychomycosis of toenails of both feet   2. Idiopathic peripheral neuropathy   3. Controlled type 2 diabetes mellitus without complication, without long-term current use of insulin (HCC)    Patient was evaluated and treated. All patient's and/or POA's questions/concerns addressed on today's visit. Mycotic toenails 1-5 debrided in length and girth without incident.  Continue daily foot inspections and monitor blood glucose per PCP/Endocrinologist's recommendations.Continue soft, supportive shoe gear daily. Report any pedal injuries to medical professional. Call office if there are any quesitons/concerns. -Patient/POA to call should there be question/concern in the interim.   Return in about 12 weeks (around 05/18/2024).  Donna Baldwin, DPM      [place] LOCATION: [address] [place] [address]                   Office [phone]   [facility] LOCATION: [address] [place], [place] [address] Office [phone]

## 2024-03-04 ENCOUNTER — Ambulatory Visit: Attending: Cardiovascular Disease

## 2024-03-04 DIAGNOSIS — R002 Palpitations: Secondary | ICD-10-CM

## 2024-03-15 ENCOUNTER — Other Ambulatory Visit: Payer: Self-pay | Admitting: *Deleted

## 2024-03-15 ENCOUNTER — Encounter: Payer: Self-pay | Admitting: Cardiology

## 2024-03-15 DIAGNOSIS — H43393 Other vitreous opacities, bilateral: Secondary | ICD-10-CM | POA: Diagnosis not present

## 2024-03-15 DIAGNOSIS — H53143 Visual discomfort, bilateral: Secondary | ICD-10-CM | POA: Diagnosis not present

## 2024-03-15 DIAGNOSIS — H43813 Vitreous degeneration, bilateral: Secondary | ICD-10-CM | POA: Diagnosis not present

## 2024-03-15 DIAGNOSIS — H04123 Dry eye syndrome of bilateral lacrimal glands: Secondary | ICD-10-CM | POA: Diagnosis not present

## 2024-03-15 DIAGNOSIS — D696 Thrombocytopenia, unspecified: Secondary | ICD-10-CM

## 2024-03-15 NOTE — Progress Notes (Signed)
 Event monitor reveals symptomatic PVCs that are very rare, otherwise no significant arrhythmias.  There was 1 episode of auto detected heart block for 4 seconds, on 02/09/2024 at 8:11 PM but no symptoms of dizziness or syncope described and hence no further evaluation is indicated.  Continue observation for now.

## 2024-03-16 ENCOUNTER — Inpatient Hospital Stay (HOSPITAL_BASED_OUTPATIENT_CLINIC_OR_DEPARTMENT_OTHER): Payer: HMO | Admitting: Internal Medicine

## 2024-03-16 ENCOUNTER — Inpatient Hospital Stay: Payer: HMO | Attending: Internal Medicine

## 2024-03-16 VITALS — BP 147/75 | HR 54 | Temp 97.6°F | Resp 15 | Ht 65.0 in | Wt 109.0 lb

## 2024-03-16 DIAGNOSIS — D509 Iron deficiency anemia, unspecified: Secondary | ICD-10-CM

## 2024-03-16 DIAGNOSIS — D693 Immune thrombocytopenic purpura: Secondary | ICD-10-CM | POA: Insufficient documentation

## 2024-03-16 DIAGNOSIS — D696 Thrombocytopenia, unspecified: Secondary | ICD-10-CM

## 2024-03-16 DIAGNOSIS — D72819 Decreased white blood cell count, unspecified: Secondary | ICD-10-CM | POA: Insufficient documentation

## 2024-03-16 LAB — CMP (CANCER CENTER ONLY)
ALT: 14 U/L (ref 0–44)
AST: 20 U/L (ref 15–41)
Albumin: 4.2 g/dL (ref 3.5–5.0)
Alkaline Phosphatase: 124 U/L (ref 38–126)
Anion gap: 3 — ABNORMAL LOW (ref 5–15)
BUN: 13 mg/dL (ref 8–23)
CO2: 29 mmol/L (ref 22–32)
Calcium: 9.3 mg/dL (ref 8.9–10.3)
Chloride: 109 mmol/L (ref 98–111)
Creatinine: 0.82 mg/dL (ref 0.44–1.00)
GFR, Estimated: >60 mL/min (ref >60)
Glucose, Bld: 97 mg/dL (ref 70–99)
Potassium: 4.2 mmol/L (ref 3.5–5.1)
Sodium: 141 mmol/L (ref 135–145)
Total Bilirubin: 0.4 mg/dL (ref 0.0–1.2)
Total Protein: 6.8 g/dL (ref 6.5–8.1)

## 2024-03-16 LAB — CBC WITH DIFFERENTIAL (CANCER CENTER ONLY)
Abs Immature Granulocytes: 0.01 10*3/uL (ref 0.00–0.07)
Basophils Absolute: 0 10*3/uL (ref 0.0–0.1)
Basophils Relative: 1 %
Eosinophils Absolute: 0 10*3/uL (ref 0.0–0.5)
Eosinophils Relative: 1 %
HCT: 37.9 % (ref 36.0–46.0)
Hemoglobin: 11.6 g/dL — ABNORMAL LOW (ref 12.0–15.0)
Immature Granulocytes: 0 %
Lymphocytes Relative: 35 %
Lymphs Abs: 1.1 10*3/uL (ref 0.7–4.0)
MCH: 22.7 pg — ABNORMAL LOW (ref 26.0–34.0)
MCHC: 30.6 g/dL (ref 30.0–36.0)
MCV: 74.3 fL — ABNORMAL LOW (ref 80.0–100.0)
Monocytes Absolute: 0.3 10*3/uL (ref 0.1–1.0)
Monocytes Relative: 10 %
Neutro Abs: 1.7 10*3/uL (ref 1.7–7.7)
Neutrophils Relative %: 53 %
Platelet Count: 116 10*3/uL — ABNORMAL LOW (ref 150–400)
RBC: 5.1 MIL/uL (ref 3.87–5.11)
RDW: 15.6 % — ABNORMAL HIGH (ref 11.5–15.5)
WBC Count: 3.3 10*3/uL — ABNORMAL LOW (ref 4.0–10.5)
nRBC: 0 % (ref 0.0–0.2)

## 2024-03-16 LAB — IRON AND IRON BINDING CAPACITY (CC-WL,HP ONLY)
Iron: 70 ug/dL (ref 28–170)
Saturation Ratios: 20 % (ref 10.4–31.8)
TIBC: 343 ug/dL (ref 250–450)
UIBC: 273 ug/dL (ref 148–442)

## 2024-03-16 LAB — VITAMIN B12: Vitamin B-12: 804 pg/mL (ref 180–914)

## 2024-03-16 LAB — FERRITIN: Ferritin: 68 ng/mL (ref 11–307)

## 2024-03-16 NOTE — Progress Notes (Signed)
 [facility] [phone]   [phone]  OFFICE PROGRESS NOTE  Laurann Montana, MD [phone] [address] [place] [address]  DIAGNOSIS:  1) Thrombocytopenia likely ITP. 2) microcytic anemia of unclear etiology.   PRIOR THERAPY: None  CURRENT THERAPY: Multivitamins with vitamin B12 supplements.  INTERVAL HISTORY: Donna Baldwin 83 y.o. female returns to the clinic today for follow-up visit accompanied by her daughter.Discussed the use of AI scribe software for clinical note transcription with the patient, who gave verbal consent to proceed.  History of Present Illness   Donna Baldwin is an 83 year old female who presents for follow-up of low platelets and microcytic anemia. She is accompanied by her daughter.  She presents for follow-up of low platelets, previously suspected to be due to immune-mediated thrombocytopenia. Her platelet count was 105,000/L during a recent hospitalization and has since improved to 116,000/L. She is not currently on any treatment for this condition.  She also has microcytic anemia, with a hemoglobin level that was 11.2 g/dL during her hospital stay and has improved to 11.6 g/dL. Her ferritin test was conducted, and thalassemia was ruled out. Hemoglobin electrophoresis was normal.  She was hospitalized for heart palpitations, during which her medications were adjusted, and she was taken off multivitamins and B12 supplements. Her white blood cell count was 2.7 x 10^9/L during hospitalization and has improved to 3.3 x 10^9/L.  There is a family history of thalassemia testing, which was negative for her mother.        MEDICAL HISTORY: Past Medical History:  Diagnosis Date   Acid reflux    Anemia    Anxiety    Back pain    GERD (gastroesophageal reflux disease)    Hyperlipidemia    Hypertension    Hypertensive heart disease without heart failure    Thrombocytopenia (HCC)    Vitamin D  deficiency     ALLERGIES:  is allergic to beef allergy; beef-derived drug products; codeine; iodine; iodine-131; ivp dye [iodinated contrast media]; latex; morphine and codeine; penicillins; sertraline; shellfish allergy; solu-medrol [methylprednisolone]; sulfa antibiotics; sulfasalazine; barbiturates; bee pollen; bee venom; bioflavonoid products; diovan [valsartan]; gabapentin; other; oxycodone; pitavastatin; promethazine-dm; tape; wellness essentials blood sugr [nutritional supplements]; albuterol; ciprofloxacin; crestor [rosuvastatin]; diltiazem hcl; ezetimibe; famotidine; oxycodone hcl; pred forte [prednisolone acetate]; welchol [colesevelam]; celebrex [celecoxib]; lanolin; mobic [meloxicam]; nickel; nitrates, organic; petrolatum; soy allergy (obsolete); and strawberry extract.  MEDICATIONS:  Current Outpatient Medications  Medication Sig Dispense Refill   amLODipine (NORVASC) 5 MG tablet Take 1 tablet (5 mg total) by mouth daily. 90 tablet 3   aspirin EC 81 MG tablet Take 81 mg by mouth as needed.     cetirizine (ZYRTEC) 10 MG tablet Take 10 mg by mouth daily as needed for allergies.     EPINEPHrine (EPIPEN 2-PAK) 0.3 mg/0.3 mL IJ SOAJ injection Inject 0.3 mg into the muscle as needed for anaphylaxis. 2 each 1   No current facility-administered medications for this visit.    SURGICAL HISTORY:  Past Surgical History:  Procedure Laterality Date   ESOPHAGEAL MANOMETRY N/A 03/15/2020   Procedure: ESOPHAGEAL MANOMETRY (EM);  Surgeon: Charlott Rakes, MD;  Location: [facility] END[facility]COPY;  Service: Endoscopy;  Laterality: N/A;   EYE SURGERY     HELLER MYOTOMY N/A 04/18/2020   Procedure: LAPAROSCOPIC HELLER MYOTOMY WITH UPPER ENDOSCOPY;  Surgeon: Kinsinger, De Blanch, MD;  Location: [facility] ORS;  Service: General;  Laterality: N/A;   PH IMPEDANCE STUDY N/A 03/15/2020  Procedure: PH IMPEDANCE STUDY;  Surgeon: Charlott Rakes, MD;  Location: [facility];  Service: Endoscopy;  Laterality: N/A;   SHOULDER  SURGERY Bilateral    TONSILLECTOMY      REVIEW OF SYSTEMS:  Constitutional: positive for fatigue Eyes: negative Ears, nose, mouth, throat, and face: negative Respiratory: negative Cardiovascular: negative Gastrointestinal: negative Genitourinary:negative Integument/breast: negative Hematologic/lymphatic: negative Musculoskeletal:negative Neurological: negative Behavioral/Psych: negative Endocrine: negative Allergic/Immunologic: negative   PHYSICAL EXAMINATION: General appearance: alert, cooperative, fatigued, and no distress Head: Normocephalic, without obvious abnormality, atraumatic Neck: no adenopathy, no JVD, supple, symmetrical, trachea midline, and thyroid not enlarged, symmetric, no tenderness/mass/nodules Lymph nodes: Cervical, supraclavicular, and axillary nodes normal. Resp: clear to auscultation bilaterally Back: symmetric, no curvature. ROM normal. No CVA tenderness. Cardio: regular rate and rhythm, S1, S2 normal, no murmur, click, rub or gallop GI: soft, non-tender; bowel sounds normal; no masses,  no organomegaly Extremities: extremities normal, atraumatic, no cyanosis or edema Neurologic: Alert and oriented X 3, normal strength and tone. Normal symmetric reflexes. Normal coordination and gait  ECOG PERFORMANCE STATUS: 1 - Symptomatic but completely ambulatory  Blood pressure (!) 147/75, pulse (!) 54, temperature 97.6 F (36.4 C), temperature source Temporal, resp. rate 15, height 5\' 5"  (1.651 m), weight 109 lb (49.4 kg), SpO2 100%.  LABORATORY DATA: Lab Results  Component Value Date   WBC 3.3 (L) 03/16/2024   HGB 11.6 (L) 03/16/2024   HCT 37.9 03/16/2024   MCV 74.3 (L) 03/16/2024   PLT 116 (L) 03/16/2024      Chemistry      Component Value Date/Time   NA 141 03/16/2024 1009   NA 143 10/08/2023 1123   K 4.2 03/16/2024 1009   CL 109 03/16/2024 1009   CO2 29 03/16/2024 1009   BUN 13 03/16/2024 1009   BUN 10 10/08/2023 1123   CREATININE 0.82  03/16/2024 1009   CREATININE 0.87 04/03/2017 1059      Component Value Date/Time   CALCIUM 9.3 03/16/2024 1009   ALKPHOS 124 03/16/2024 1009   AST 20 03/16/2024 1009   ALT 14 03/16/2024 1009   BILITOT 0.4 03/16/2024 1009       RADIOGRAPHIC STUDIES: Cardiac event monitor Result Date: 03/15/2024 Images from the original result were not included. Extended EKG monitoring life 30 days starting 01/30/2024. Minimum heart rate 33 bpm at 4:29 AM, maximum heart rate 143 beats minute at 10:37 AM with average heartbeat of 59 bpm. There was no atrial fibrillation. Rare ventricular ectopics, burden <1%. Rare supraventricular ectopics, burden <2%. 1 pause, longest 4.2 seconds at 8:11 PM on 02/09/2024.  Patient was asymptomatic and was auto-triggered event. Patient symptoms of rapid heartbeat or palpitations correlated with PVCs and sinus tachycardia/sinus rhythm.    ASSESSMENT AND PLAN: This is a very pleasant 83 years old white female with mild thrombocytopenia likely ITP in addition to mild microcytic anemia.  The patient underwent hemoglobin electrophoresis that showed normal pattern.   The patient is currently on multivitamins as well as B12 orally.  She is tolerating her treatment well.    Immune Thrombocytopenic Purpura (ITP) She has a mild form of ITP with low platelet counts, which have improved from 105,000 in January to 116,000 currently. The condition does not require immediate intervention, and her age may contribute to the low platelet count. The current improvement suggests a less urgent need for intervention.  Leukopenia She has leukopenia with a white blood cell count that improved from 2.7 in January to 3.3 currently. Although still low, the improvement  suggests a less urgent need for intervention. The low counts of white blood cells, red blood cells, and platelets raise the possibility of an underlying bone marrow condition such as myelodysplastic syndrome.  Microcytic Anemia She has  mild microcytic anemia with a hemoglobin level that improved from 11.2 in January to 11.6 currently, approaching the normal range for females. Multivitamins and B12 supplements were discontinued during a recent hospitalization for heart palpitations.  Bone Marrow Evaluation Consideration A bone marrow biopsy is considered to rule out conditions like myelodysplastic syndrome due to low blood counts. She is hesitant about the procedure, which involves sampling from the hip bone and is typically done in a hospital setting. Potential findings include conditions like myelodysplastic syndrome, which may require supportive care. The decision to proceed is deferred until a decline in blood counts or her decision to proceed. - Consider bone marrow biopsy if blood counts decline or if she decides to proceed with the evaluation.  Follow-up She is advised to follow up in six months unless there is a change in her condition or she decides to proceed with the bone marrow biopsy sooner. - Schedule follow-up appointment in six months.   The patient vies to call immediately if she has concerning symptoms in the interval. The patient voices understanding of current disease status and treatment options and is in agreement with the current care plan.  All questions were answered. The patient knows to call the clinic with any problems, questions or concerns. We can certainly see the patient much sooner if necessary. The total time spent in the appointment was 30 minutes.  Disclaimer: This note was dictated with voice recognition software. Similar sounding words can inadvertently be transcribed and may not be corrected upon review.

## 2024-03-17 NOTE — Progress Notes (Unsigned)
 Patient ID: Donna Baldwin                 DOB: Mar 20, 1941                    MRN: [id number]      HPI: Donna Baldwin is a 83 y.o. female patient referred to lipid clinic by Dr. Jacinto Halim. PMH is significant for mildly elevated coronary calcium score with noncritical distal left main minimal disease by coronary CTA in 2020, coronary calcium score in the 50th percentile, difficult to control hypertension, asymptomatic bilateral carotid stenosis, severe peripheral arterial disease especially involving right lower extremity worse on the left, being followed by vascular surgery, multiple medication allergies and intolerance of statins, achalasia cardia SP surgery in 2019, severe vitamin D deficiency, chronic thrombocytopenia felt to be ITP asymptomatic.   Patient with many allergies.  History of anaphylaxis to some medications and foods.  She presents today to lipid clinic accompanied by her husband.  She never started Repatha due to cost in the past.  Has attempted to start several times but issues with high deductible prevented her from starting.  Patient appears willing, but nervous about the injections.  She does have an EpiPen.  Injection technique was reviewed with patient.  Advised that she cannot come into the clinic for every injection, however, she can come in for her first injection and we can help her with this.  She can bring her EpiPen in case she does have a reaction and we will also have our code team here as well.  Chances of anaphylaxis extremely low, however due to her history this would make her feel more comfortable.  We discussed that we should be able to get her healthwell grant which will cover her cost.  She does not wish for medication to be delivered by [facility] however, she will pick up at [facility] pharmacy.  Current Medications: None Intolerances: pitivastatin (angioedema), rosuvastatin, ezetimibe (myalgias), WelChol (myalgias) Risk Factors: PAD, HTN, age LDL-C  goal: <70 ApoB goal: <80  Family History:  Family History  Problem Relation Age of Onset   Cancer Father    Allergic rhinitis Neg Hx    Angioedema Neg Hx    Asthma Neg Hx    Atopy Neg Hx    Eczema Neg Hx    Immunodeficiency Neg Hx    Urticaria Neg Hx      Social History:  Social History   Socioeconomic History   Marital status: Married    Spouse name: Not on file   Number of children: 2   Years of education: 14   Highest education level: Not on file  Occupational History   Not on file  Tobacco Use   Smoking status: Never   Smokeless tobacco: Never  Vaping Use   Vaping status: Never Used  Substance and Sexual Activity   Alcohol use: No    Alcohol/week: 0.0 standard drinks of alcohol   Drug use: No   Sexual activity: Never  Other Topics Concern   Not on file  Social History Narrative   Right handed   Some tea  (smoothie tea for bowel movement once a week)   Lives w/ husband   Social Drivers of Corporate investment banker Strain: Not on file  Food Insecurity: No Food Insecurity (01/22/2024)   Hunger Vital Sign    Worried About Running Out of Food in the Last Year: Never true    Ran Out of  Food in the Last Year: Never true  Transportation Needs: No Transportation Needs (01/22/2024)   PRAPARE - Administrator, Civil Service (Medical): No    Lack of Transportation (Non-Medical): No  Physical Activity: Not on file  Stress: Not on file  Social Connections: Socially Integrated (01/23/2024)   Social Connection and Isolation Panel [NHANES]    Frequency of Communication with Friends and Family: More than three times a week    Frequency of Social Gatherings with Friends and Family: More than three times a week    Attends Religious Services: More than 4 times per year    Active Member of [employer] or Organizations: Yes    Attends Banker Meetings: 1 to 4 times per year    Marital Status: Married  Catering manager Violence: Not At Risk (01/22/2024)    Humiliation, Afraid, Rape, and Kick questionnaire    Fear of Current or Ex-Partner: No    Emotionally Abused: No    Physically Abused: No    Sexually Abused: No     Labs: Lipid Panel  Cholesterol 231 <200 mg/dL    CHOL/HDL 2.9 5.4-0.9 Ratio    HDLD 80 30-85 mg/dL Values below 40 mg/dL indicate increased risk factor  Triglyceride 75 0-199 mg/dL    NHDL 811 9-147 mg/dL Range dependent upon risk factors.  LDL Chol Calc (NIH) 138 0-99 mg/dL         Component Value Date/Time   CHOL 256 (H) 12/18/2021 0947   TRIG 67 12/18/2021 0947   HDL 86 12/18/2021 0947   CHOLHDL 2.6 10/31/2020 0844   CHOLHDL 3.3 07/12/2014 0936   VLDL 14 07/12/2014 0936   LDLCALC 159 (H) 12/18/2021 0947   LABVLDL 11 12/18/2021 0947    Past Medical History:  Diagnosis Date   Acid reflux    Anemia    Anxiety    Back pain    GERD (gastroesophageal reflux disease)    Hyperlipidemia    Hypertension    Hypertensive heart disease without heart failure    Thrombocytopenia (HCC)    Vitamin D deficiency     Current Outpatient Medications on File Prior to Visit  Medication Sig Dispense Refill   amLODipine (NORVASC) 5 MG tablet Take 1 tablet (5 mg total) by mouth daily. 90 tablet 3   aspirin EC 81 MG tablet Take 81 mg by mouth as needed.     cetirizine (ZYRTEC) 10 MG tablet Take 10 mg by mouth daily as needed for allergies.     EPINEPHrine (EPIPEN 2-PAK) 0.3 mg/0.3 mL IJ SOAJ injection Inject 0.3 mg into the muscle as needed for anaphylaxis. 2 each 1   No current facility-administered medications on file prior to visit.    Allergies  Allergen Reactions   Beef Allergy Anaphylaxis   Beef-Derived Drug Products Anaphylaxis   Codeine Anxiety and Hives   Iodine Anaphylaxis   Iodine-131 Anaphylaxis   Ivp Dye [Iodinated Contrast Media] Anaphylaxis and Other (See Comments)   Latex Rash   Morphine And Codeine Other (See Comments)    Tremors, increased heart rate, excitement, confusion per pt   Penicillins  Hives    Has patient had a PCN reaction causing immediate rash, facial/tongue/throat swelling, SOB or lightheadedness with hypotension: Yes Has patient had a PCN reaction causing severe rash involving mucus membranes or skin necrosis: Yes Has patient had a PCN reaction that required hospitalization: Yes Has patient had a PCN reaction occurring within the last 10 years: No If all of  the above answers are "NO", then may proceed with Cephalosporin use.    Sertraline Other (See Comments)    Numbness in mouth and hyperactivity   Shellfish Allergy Hives   Solu-Medrol [Methylprednisolone] Other (See Comments)    Numbness and tingling in mouth   Sulfa Antibiotics Hives and Rash   Sulfasalazine Hives and Rash   Barbiturates Other (See Comments)    Excitement    Bee Pollen Hives   Bee Venom Hives   Bioflavonoid Products     Other reaction(s): Other (See Comments) Nuts, strawberries, bicarbonate of soda, Nuts, strawberries, bicarbonate of soda,   Diovan [Valsartan] Nausea Only   Gabapentin Other (See Comments)    Visual disturbance   Other Palpitations, Other (See Comments) and Rash    NARCOTIC ANALGESICS-TREMORS, INCREASED HEART RATE Hmg-Coa Reductase Inhibitors - intolerance unknown    Oxycodone Swelling, Rash and Cough   Pitavastatin Other (See Comments) and Swelling    Facial swelling  Other Reaction(s): angioedema, angioedema, angioedema   Promethazine-Dm Other (See Comments)    "Felt funny"   Tape Rash and Other (See Comments)    Red blotches    Wellness Essentials Blood Sugr [Nutritional Supplements] Other (See Comments)    Nuts, strawberries, bicarbonate of soda,   Albuterol Other (See Comments)    Caused wheezing   Ciprofloxacin Other (See Comments)   Crestor [Rosuvastatin] Other (See Comments)   Diltiazem Hcl     Other reaction(s): foot sores   Ezetimibe     Other reaction(s): myalgias   Famotidine Other (See Comments)    Unknown reaction   Oxycodone Hcl     Other  reaction(s): hives, rash   Pred Forte [Prednisolone Acetate] Other (See Comments)    Patient is seeing odd color and images while using this   Welchol [Colesevelam]     Other reaction(s): myalgias   Celebrex [Celecoxib] Swelling and Other (See Comments)    Swelling and numbness (mouth)   Lanolin Itching   Mobic [Meloxicam] Swelling and Other (See Comments)    Swelling and numbness in mouth   Nickel Rash   Nitrates, Organic Rash and Other (See Comments)    Chest tightness also   Petrolatum Rash and Other (See Comments)   Soy Allergy (Obsolete) Rash   Strawberry Extract Rash    Assessment/Plan:  1. Hyperlipidemia -  Mixed hyperlipidemia Assessment: LDL-C above goal of less than 70 Patient has multiple intolerances to statins and ezetimibe Several of her allergies or anaphylaxis.  She does carry an EpiPen  Plan: Will submit prior authorization for Repatha We will need to get healthwell grant,and info to [facility] and then send to [facility] Patient would like to do first injection in clinic Labs in 3 months    Thank you,  Olene Floss, Pharm.D, BCACP, CPP [facility] Division of [facility] [address], [place], [place] [address]  Phone: [phone]; Fax: [phone]

## 2024-03-18 ENCOUNTER — Other Ambulatory Visit (HOSPITAL_COMMUNITY): Payer: Self-pay

## 2024-03-18 ENCOUNTER — Telehealth: Payer: Self-pay

## 2024-03-18 ENCOUNTER — Telehealth: Payer: Self-pay | Admitting: Pharmacy Technician

## 2024-03-18 ENCOUNTER — Ambulatory Visit: Payer: HMO | Attending: Cardiology | Admitting: Pharmacist

## 2024-03-18 DIAGNOSIS — E782 Mixed hyperlipidemia: Secondary | ICD-10-CM | POA: Diagnosis not present

## 2024-03-18 MED ORDER — REPATHA SURECLICK 140 MG/ML ~~LOC~~ SOAJ
1.0000 mL | SUBCUTANEOUS | 11 refills | Status: DC
  Filled 2024-03-18: qty 2, 28d supply, fill #0

## 2024-03-18 NOTE — Telephone Encounter (Signed)
 Patient Advocate Encounter   The patient was approved for a Healthwell grant that will help cover the cost of REPATHA Total amount awarded, $2,500.  Effective: 02/17/24 - 02/15/25   [id number]  Haze Rushing, CPhT  Pharmacy Patient Advocate Specialist  Direct Number: [phone] Fax: [phone]

## 2024-03-18 NOTE — Telephone Encounter (Signed)
 Called pt and LVM to call back. RX sent to [facility] pharmacy. Kennedy Bucker should be in [facility]

## 2024-03-18 NOTE — Patient Instructions (Signed)

## 2024-03-18 NOTE — Telephone Encounter (Signed)
 Pharmacy Patient Advocate Encounter  Received notification from [facility] ADVANTAGE/RX ADVANCE that Prior Authorization for Repatha has been APPROVED from 03/18/24 to 09/14/24. Ran test claim, Copay is $47.00- one month. This test claim was processed through [facility]- copay amounts may vary at other pharmacies due to pharmacy/plan contracts, or as the patient moves through the different stages of their insurance plan.   PA #/Case ID/Reference #: [id number]

## 2024-03-18 NOTE — Telephone Encounter (Signed)
 Pharmacy Patient Advocate Encounter   Received notification from Physician's Office that prior authorization for Repatha is required/requested.   Insurance verification completed.   The patient is insured through [facility] ADVANTAGE/RX ADVANCE .   Per test claim: PA required; PA submitted to above mentioned insurance via CoverMyMeds Key/confirmation #/EOC [id number] Status is pending

## 2024-03-18 NOTE — Assessment & Plan Note (Signed)
 Assessment: LDL-C above goal of less than 70 Patient has multiple intolerances to statins and ezetimibe Several of her allergies or anaphylaxis.  She does carry an EpiPen  Plan: Will submit prior authorization for Repatha We will need to get healthwell grant,and info to [facility] and then send to [facility] Patient would like to do first injection in clinic Labs in 3 months

## 2024-03-18 NOTE — Telephone Encounter (Signed)
-----   Message from Olene Floss sent at 03/18/2024 12:12 PM EDT ----- Please do PA for Repatha and then get patient healthwell grant and put info in [facility] Please

## 2024-03-19 NOTE — Telephone Encounter (Signed)
 Pt called back, informed her about grant and AP approval. She will get prescription from pharmacy on Monday and will call us back so that she can learn administration steps.

## 2024-03-22 MED ORDER — EPINEPHRINE 0.3 MG/0.3ML IJ SOAJ: 0.3000 mg | INTRAMUSCULAR | 1 refills | Status: AC | PRN

## 2024-03-22 MED ORDER — EPINEPHRINE 0.3 MG/0.3ML IJ SOAJ: 0.3000 mg | INTRAMUSCULAR | 1 refills | Status: DC | PRN

## 2024-03-22 NOTE — Addendum Note (Signed)
 Addended by: Tylene Fantasia on: 03/22/2024 08:30 AM   Modules accepted: Orders

## 2024-03-24 ENCOUNTER — Other Ambulatory Visit (HOSPITAL_COMMUNITY): Payer: Self-pay

## 2024-03-29 NOTE — Telephone Encounter (Signed)
 Called today to state that she just does not think she will be able to get the Repatha shots.  She is very nervous about a delayed reaction.  Still does not know how to use her EpiPen.  [place] and even if she called 911 she is afraid of how long it will take for them to get there.  She also does not think he will be able to give them to herself.  She has a lot of fear about this.  She will still come in tomorrow and I will show her how to use her EpiPen and we can chat briefly about Nexletol.

## 2024-03-30 ENCOUNTER — Ambulatory Visit: Attending: Internal Medicine | Admitting: Pharmacist

## 2024-03-30 DIAGNOSIS — E782 Mixed hyperlipidemia: Secondary | ICD-10-CM

## 2024-03-30 NOTE — Assessment & Plan Note (Signed)
 Assessment: Patient very nervous about doing Repatha due to her history of anaphylaxis with penicillin and her sensitivities to other medications She does not think that she will be able to give herself the Repatha and is very concerned about a delayed reaction.  Concerned that she may have a reaction after several doses and that her husband may not be home.   Reviewed the steps multiple times with patient on how to give EpiPen Reviewed Nexletol as an option as well We talked about the potential benefits of each medication  Plan: Patient would like to do more research on Nexletol Still potentially considering Repatha Patient will make a decision and contact me

## 2024-03-30 NOTE — Progress Notes (Signed)
 Patient ID: Donna Baldwin                 DOB: [birth year]                    MRN: [id number]      HPI: Donna Baldwin is a 83 y.o. female patient referred to lipid clinic by Dr. Jacinto Halim. PMH is significant for mildly elevated coronary calcium score with noncritical distal left main minimal disease by coronary CTA in 2020, coronary calcium score in the 50th percentile, difficult to control hypertension, asymptomatic bilateral carotid stenosis, severe peripheral arterial disease especially involving right lower extremity worse on the left, being followed by vascular surgery, multiple medication allergies and intolerance of statins, achalasia cardia SP surgery in 2019, severe vitamin D deficiency, chronic thrombocytopenia felt to be ITP asymptomatic.   Patient with many allergies.  History of anaphylaxis to some medications and foods.  Previously seen in lipid clinic 2025.  At that appointment she was agreeable to trying Repatha with the only stipulation that she come in monitor to for her first injection.  EpiPen was sent in.  She picked up her Repatha prescription and scheduled appointment for first injection, however, patient started to get extremely nervous about being able to give herself the Repatha injection and give herself the EpiPen if needed.  She does not think that she will be able to give herself the Repatha and is very concerned about a delayed reaction.  Concerned that she may have a reaction after several doses and that her husband may not be home.  [place] and is concerned about how far from the hospital she lives.  Advise she come in for [place] to teach her how to use her EpiPen.    Patient presents today to lipid clinic accompanied by her husband.  She did bring her on Repatha but she is not ready to start.  We walked her through how to use her EpiPen including showing her on her actual pen and watching a video.  She is concerned because the version of EpiPen  that she has the needle does not retract.  Reviewed the steps multiple times with patient.  We also discussed Nexletol as an alternative.  Side effects reviewed.  Current Medications: None Intolerances: pitivastatin (angioedema), rosuvastatin, ezetimibe (myalgias), WelChol (myalgias) Risk Factors: PAD, HTN, age LDL-C goal: <70 ApoB goal: <80  Family History:  Family History  Problem Relation Age of Onset   Cancer Father    Allergic rhinitis Neg Hx    Angioedema Neg Hx    Asthma Neg Hx    Atopy Neg Hx    Eczema Neg Hx    Immunodeficiency Neg Hx    Urticaria Neg Hx      Social History:  Social History   Socioeconomic History   Marital status: Married    Spouse name: Not on file   Number of children: 2   Years of education: 14   Highest education level: Not on file  Occupational History   Not on file  Tobacco Use   Smoking status: Never   Smokeless tobacco: Never  Vaping Use   Vaping status: Never Used  Substance and Sexual Activity   Alcohol use: No    Alcohol/week: 0.0 standard drinks of alcohol   Drug use: No   Sexual activity: Never  Other Topics Concern   Not on file  Social History Narrative   Right handed   Some  tea  (smoothie tea for bowel movement once a week)   Lives w/ husband   Social Drivers of Corporate investment banker Strain: Not on file  Food Insecurity: No Food Insecurity (01/22/2024)   Hunger Vital Sign    Worried About Running Out of Food in the Last Year: Never true    Ran Out of Food in the Last Year: Never true  Transportation Needs: No Transportation Needs (01/22/2024)   PRAPARE - Administrator, Civil Service (Medical): No    Lack of Transportation (Non-Medical): No  Physical Activity: Not on file  Stress: Not on file  Social Connections: Socially Integrated (01/23/2024)   Social Connection and Isolation Panel [NHANES]    Frequency of Communication with Friends and Family: More than three times a week    Frequency of  Social Gatherings with Friends and Family: More than three times a week    Attends Religious Services: More than 4 times per year    Active Member of [employer] or Organizations: Yes    Attends Banker Meetings: 1 to 4 times per year    Marital Status: Married  Catering manager Violence: Not At Risk (01/22/2024)   Humiliation, Afraid, Rape, and Kick questionnaire    Fear of Current or Ex-Partner: No    Emotionally Abused: No    Physically Abused: No    Sexually Abused: No     Labs: Lipid Panel  Cholesterol 231 <200 mg/dL    CHOL/HDL 2.9 1.6-1.0 Ratio    HDLD 80 30-85 mg/dL Values below 40 mg/dL indicate increased risk factor  Triglyceride 75 0-199 mg/dL    NHDL 960 4-540 mg/dL Range dependent upon risk factors.  LDL Chol Calc (NIH) 138 0-99 mg/dL         Component Value Date/Time   CHOL 256 (H) 12/18/2021 0947   TRIG 67 12/18/2021 0947   HDL 86 12/18/2021 0947   CHOLHDL 2.6 10/31/2020 0844   CHOLHDL 3.3 07/12/2014 0936   VLDL 14 07/12/2014 0936   LDLCALC 159 (H) 12/18/2021 0947   LABVLDL 11 12/18/2021 0947    Past Medical History:  Diagnosis Date   Acid reflux    Anemia    Anxiety    Back pain    GERD (gastroesophageal reflux disease)    Hyperlipidemia    Hypertension    Hypertensive heart disease without heart failure    Thrombocytopenia (HCC)    Vitamin D deficiency     Current Outpatient Medications on File Prior to Visit  Medication Sig Dispense Refill   amLODipine (NORVASC) 5 MG tablet Take 1 tablet (5 mg total) by mouth daily. 90 tablet 3   aspirin EC 81 MG tablet Take 81 mg by mouth as needed.     cetirizine (ZYRTEC) 10 MG tablet Take 10 mg by mouth daily as needed for allergies.     EPINEPHrine 0.3 mg/0.3 mL IJ SOAJ injection Inject 0.3 mg into the muscle as needed for anaphylaxis. 1 each 1   Evolocumab (REPATHA SURECLICK) 140 MG/ML SOAJ Inject 140 mg into the skin every 14 (fourteen) days. 2 mL 11   No current facility-administered medications  on file prior to visit.    Allergies  Allergen Reactions   Beef Allergy Anaphylaxis   Beef-Derived Drug Products Anaphylaxis   Codeine Anxiety and Hives   Iodine Anaphylaxis   Iodine-131 Anaphylaxis   Ivp Dye [Iodinated Contrast Media] Anaphylaxis and Other (See Comments)   Latex Rash   Morphine And  Codeine Other (See Comments)    Tremors, increased heart rate, excitement, confusion per pt   Penicillins Hives    Has patient had a PCN reaction causing immediate rash, facial/tongue/throat swelling, SOB or lightheadedness with hypotension: Yes Has patient had a PCN reaction causing severe rash involving mucus membranes or skin necrosis: Yes Has patient had a PCN reaction that required hospitalization: Yes Has patient had a PCN reaction occurring within the last 10 years: No If all of the above answers are "NO", then may proceed with Cephalosporin use.    Sertraline Other (See Comments)    Numbness in mouth and hyperactivity   Shellfish Allergy Hives   Solu-Medrol [Methylprednisolone] Other (See Comments)    Numbness and tingling in mouth   Sulfa Antibiotics Hives and Rash   Sulfasalazine Hives and Rash   Barbiturates Other (See Comments)    Excitement    Bee Pollen Hives   Bee Venom Hives   Bioflavonoid Products     Other reaction(s): Other (See Comments) Nuts, strawberries, bicarbonate of soda, Nuts, strawberries, bicarbonate of soda,   Diovan [Valsartan] Nausea Only   Gabapentin Other (See Comments)    Visual disturbance   Other Palpitations, Other (See Comments) and Rash    NARCOTIC ANALGESICS-TREMORS, INCREASED HEART RATE Hmg-Coa Reductase Inhibitors - intolerance unknown    Oxycodone Swelling, Rash and Cough   Pitavastatin Other (See Comments) and Swelling    Facial swelling  Other Reaction(s): angioedema, angioedema, angioedema   Promethazine-Dm Other (See Comments)    "Felt funny"   Tape Rash and Other (See Comments)    Red blotches    Wellness Essentials  Blood Sugr [Nutritional Supplements] Other (See Comments)    Nuts, strawberries, bicarbonate of soda,   Albuterol Other (See Comments)    Caused wheezing   Ciprofloxacin Other (See Comments)   Crestor [Rosuvastatin] Other (See Comments)   Diltiazem Hcl     Other reaction(s): foot sores   Ezetimibe     Other reaction(s): myalgias   Famotidine Other (See Comments)    Unknown reaction   Oxycodone Hcl     Other reaction(s): hives, rash   Pred Forte [Prednisolone Acetate] Other (See Comments)    Patient is seeing odd color and images while using this   Welchol [Colesevelam]     Other reaction(s): myalgias   Celebrex [Celecoxib] Swelling and Other (See Comments)    Swelling and numbness (mouth)   Lanolin Itching   Mobic [Meloxicam] Swelling and Other (See Comments)    Swelling and numbness in mouth   Nickel Rash   Nitrates, Organic Rash and Other (See Comments)    Chest tightness also   Petrolatum Rash and Other (See Comments)   Soy Allergy (Obsolete) Rash   Strawberry Extract Rash    Assessment/Plan:  1. Hyperlipidemia -  Mixed hyperlipidemia Assessment: Patient very nervous about doing Repatha due to her history of anaphylaxis with penicillin and her sensitivities to other medications She does not think that she will be able to give herself the Repatha and is very concerned about a delayed reaction.  Concerned that she may have a reaction after several doses and that her husband may not be home.   Reviewed the steps multiple times with patient on how to give EpiPen Reviewed Nexletol as an option as well We talked about the potential benefits of each medication  Plan: Patient would like to do more research on Nexletol Still potentially considering Repatha Patient will make a decision and contact me  Thank you,  Olene Floss, Pharm.D, BCACP, CPP [facility] Division of [facility] [address], [place], [place] [address]  Phone:  [phone]; Fax: [phone]

## 2024-05-19 ENCOUNTER — Other Ambulatory Visit: Payer: Self-pay | Admitting: *Deleted

## 2024-05-19 ENCOUNTER — Ambulatory Visit: Payer: HMO | Admitting: Podiatry

## 2024-05-19 DIAGNOSIS — E119 Type 2 diabetes mellitus without complications: Secondary | ICD-10-CM

## 2024-05-19 DIAGNOSIS — I739 Peripheral vascular disease, unspecified: Secondary | ICD-10-CM

## 2024-05-19 DIAGNOSIS — M79674 Pain in right toe(s): Secondary | ICD-10-CM | POA: Diagnosis not present

## 2024-05-19 DIAGNOSIS — G609 Hereditary and idiopathic neuropathy, unspecified: Secondary | ICD-10-CM | POA: Diagnosis not present

## 2024-05-19 DIAGNOSIS — B351 Tinea unguium: Secondary | ICD-10-CM

## 2024-05-19 DIAGNOSIS — M79675 Pain in left toe(s): Secondary | ICD-10-CM | POA: Diagnosis not present

## 2024-05-19 DIAGNOSIS — I6529 Occlusion and stenosis of unspecified carotid artery: Secondary | ICD-10-CM

## 2024-05-19 NOTE — Progress Notes (Unsigned)
 Subjective:  Patient ID: Donna Baldwin, female    DOB: May 11, 1941,  MRN: [id number]  83 y.o. female presents at risk foot care with history of diabetic neuropathy and painful elongated mycotic toenails 1-5 bilaterally which are tender when wearing enclosed shoe gear. Pain is relieved with periodic professional debridement.  No chief complaint on file.  New problem(s): None {[id number]}  PCP is Victorio Grave, MD , and last visit was February 26, 2024.  Allergies  Allergen Reactions   Beef Allergy  Anaphylaxis   Beef-Derived Drug Products Anaphylaxis   Codeine Anxiety and Hives   Iodine Anaphylaxis   Iodine-131 Anaphylaxis   Ivp Dye [Iodinated Contrast Media] Anaphylaxis and Other (See Comments)   Latex Rash   Morphine And Codeine Other (See Comments)    Tremors, increased heart rate, excitement, confusion per pt   Penicillins Hives    Has patient had a PCN reaction causing immediate rash, facial/tongue/throat swelling, SOB or lightheadedness with hypotension: Yes Has patient had a PCN reaction causing severe rash involving mucus membranes or skin necrosis: Yes Has patient had a PCN reaction that required hospitalization: Yes Has patient had a PCN reaction occurring within the last 10 years: No If all of the above answers are "NO", then may proceed with Cephalosporin use.    Sertraline Other (See Comments)    Numbness in mouth and hyperactivity   Shellfish Allergy  Hives   Solu-Medrol  [Methylprednisolone ] Other (See Comments)    Numbness and tingling in mouth   Sulfa Antibiotics Hives and Rash   Sulfasalazine Hives and Rash   Barbiturates Other (See Comments)    Excitement    Bee Pollen Hives   Bee Venom Hives   Bioflavonoid Products     Other reaction(s): Other (See Comments) Nuts, strawberries, bicarbonate of soda, Nuts, strawberries, bicarbonate of soda,   Diovan [Valsartan] Nausea Only   Gabapentin Other (See Comments)    Visual disturbance   Other  Palpitations, Other (See Comments) and Rash    NARCOTIC ANALGESICS-TREMORS, INCREASED HEART RATE Hmg-Coa Reductase Inhibitors - intolerance unknown    Oxycodone Swelling, Rash and Cough   Pitavastatin  Other (See Comments) and Swelling    Facial swelling  Other Reaction(s): angioedema, angioedema, angioedema   Promethazine-Dm Other (See Comments)    "Felt funny"   Tape Rash and Other (See Comments)    Red blotches    Wellness Essentials Blood Sugr [Nutritional Supplements] Other (See Comments)    Nuts, strawberries, bicarbonate of soda,   Albuterol  Other (See Comments)    Caused wheezing   Ciprofloxacin  Other (See Comments)   Crestor  [Rosuvastatin ] Other (See Comments)   Diltiazem Hcl     Other reaction(s): foot sores   Ezetimibe      Other reaction(s): myalgias   Famotidine  Other (See Comments)    Unknown reaction   Oxycodone Hcl     Other reaction(s): hives, rash   Pred Forte [Prednisolone Acetate] Other (See Comments)    Patient is seeing odd color and images while using this   Welchol [Colesevelam]     Other reaction(s): myalgias   Celebrex [Celecoxib] Swelling and Other (See Comments)    Swelling and numbness (mouth)   Lanolin Itching   Mobic [Meloxicam] Swelling and Other (See Comments)    Swelling and numbness in mouth   Nickel Rash   Nitrates, Organic Rash and Other (See Comments)    Chest tightness also   Petrolatum Rash and Other (See Comments)   Soy Allergy  (Obsolete) Rash   Strawberry Extract Rash  Review of Systems: Negative except as noted in the HPI.   Objective:  LOLETA FROMMELT is a pleasant 83 y.o. female thin build in NAD. AAO x 3.  Vascular Examination: Vascular status intact b/l with palpable pedal pulses. CFT immediate b/l. Pedal hair present. No edema. No pain with calf compression b/l. Skin temperature gradient WNL b/l. No varicosities noted. No cyanosis or clubbing noted.  Neurological Examination: Sensation grossly intact b/l with  10 gram monofilament. Vibratory sensation intact b/l.  Dermatological Examination: Pedal skin with normal turgor, texture and tone b/l. No open wounds nor interdigital macerations noted. Toenails 1-5 b/l thick, discolored, elongated with subungual debris and pain on dorsal palpation. No hyperkeratotic lesions noted b/l.   Musculoskeletal Examination: Muscle strength 5/5 to b/l LE.  No pain, crepitus noted b/l. No gross pedal deformities. Patient ambulates independently without assistive aids.   Radiographs: None  Last A1c:       No data to display           Assessment:   1. Pain due to onychomycosis of toenails of both feet   2. Idiopathic peripheral neuropathy   3. Controlled type 2 diabetes mellitus without complication, without long-term current use of insulin (HCC)    Plan:  Consent given for treatment. Patient examined. All patient's and/or POA's questions/concerns addressed on today's visit. Toenails 1-5 debrided in length and girth without incident. Continue foot and shoe inspections daily. Monitor blood glucose per PCP/Endocrinologist's recommendations. Continue soft, supportive shoe gear daily. Report any pedal injuries to medical professional. Call office if there are any questions/concerns. -Patient/POA to call should there be question/concern in the interim.  Return in about 3 months (around 08/19/2024).  Luella Sager, DPM      [facility] LOCATION: [address], [place] [address]                   Office [phone]   [facility] LOCATION: [address] [place], [place] [address] Office [phone]

## 2024-05-24 ENCOUNTER — Encounter: Payer: Self-pay | Admitting: Podiatry

## 2024-05-31 DIAGNOSIS — E785 Hyperlipidemia, unspecified: Secondary | ICD-10-CM | POA: Diagnosis not present

## 2024-05-31 DIAGNOSIS — F411 Generalized anxiety disorder: Secondary | ICD-10-CM | POA: Diagnosis not present

## 2024-05-31 DIAGNOSIS — E1140 Type 2 diabetes mellitus with diabetic neuropathy, unspecified: Secondary | ICD-10-CM | POA: Diagnosis not present

## 2024-05-31 DIAGNOSIS — G720 Drug-induced myopathy: Secondary | ICD-10-CM | POA: Diagnosis not present

## 2024-05-31 DIAGNOSIS — I700 Atherosclerosis of aorta: Secondary | ICD-10-CM | POA: Diagnosis not present

## 2024-05-31 DIAGNOSIS — D696 Thrombocytopenia, unspecified: Secondary | ICD-10-CM | POA: Diagnosis not present

## 2024-05-31 DIAGNOSIS — I10 Essential (primary) hypertension: Secondary | ICD-10-CM | POA: Diagnosis not present

## 2024-05-31 DIAGNOSIS — E1169 Type 2 diabetes mellitus with other specified complication: Secondary | ICD-10-CM | POA: Diagnosis not present

## 2024-05-31 DIAGNOSIS — I5189 Other ill-defined heart diseases: Secondary | ICD-10-CM | POA: Diagnosis not present

## 2024-05-31 DIAGNOSIS — D72819 Decreased white blood cell count, unspecified: Secondary | ICD-10-CM | POA: Diagnosis not present

## 2024-05-31 DIAGNOSIS — I6522 Occlusion and stenosis of left carotid artery: Secondary | ICD-10-CM | POA: Diagnosis not present

## 2024-05-31 DIAGNOSIS — I739 Peripheral vascular disease, unspecified: Secondary | ICD-10-CM | POA: Diagnosis not present

## 2024-06-02 ENCOUNTER — Ambulatory Visit (HOSPITAL_COMMUNITY)
Admission: RE | Admit: 2024-06-02 | Discharge: 2024-06-02 | Disposition: A | Discharge status: Home / Self Care | Source: Ambulatory Visit | Attending: Vascular Surgery | Admitting: Vascular Surgery

## 2024-06-02 ENCOUNTER — Encounter: Payer: Self-pay | Admitting: Vascular Surgery

## 2024-06-02 ENCOUNTER — Ambulatory Visit (HOSPITAL_COMMUNITY): Admitting: Vascular Surgery

## 2024-06-02 VITALS — BP 162/78 | HR 57 | Temp 97.9°F | Ht 65.0 in | Wt 111.0 lb

## 2024-06-02 DIAGNOSIS — I739 Peripheral vascular disease, unspecified: Secondary | ICD-10-CM | POA: Diagnosis not present

## 2024-06-02 DIAGNOSIS — I6529 Occlusion and stenosis of unspecified carotid artery: Secondary | ICD-10-CM

## 2024-06-02 LAB — VAS US ABI WITH/WO TBI
Left ABI: 0.75
Right ABI: 0.55

## 2024-06-02 NOTE — Progress Notes (Signed)
 Patient ID: Donna Baldwin, female   DOB: [birth year], 83 y.o.   MRN: [id number]  Reason for Consult: Follow-up   Referred by Victorio Grave, MD  Subjective:     HPI:  Donna Baldwin is a 83 y.o. female previously followed by Dr. Kermit Ped for history of peripheral arterial disease as well as asymptomatic left greater than right carotid stenosis.  She does have pain in both of her lower legs this has been attributed to neuropathy.  She denies any frank claudication does not have rest pain or tissue loss.  She continues to deny any history of stroke, TIA or amaurosis and she is taking aspirin  daily but has deferred taking Repatha  as recommended by Dr. Berry Bristol in the past.  She really has no new symptoms relative to her above-noted issues.  Past Medical History:  Diagnosis Date   Acid reflux    Anemia    Anxiety    Back pain    GERD (gastroesophageal reflux disease)    Hyperlipidemia    Hypertension    Hypertensive heart disease without heart failure    Thrombocytopenia (HCC)    Vitamin D  deficiency    Family History  Problem Relation Age of Onset   Cancer Father    Allergic rhinitis Neg Hx    Angioedema Neg Hx    Asthma Neg Hx    Atopy Neg Hx    Eczema Neg Hx    Immunodeficiency Neg Hx    Urticaria Neg Hx    Past Surgical History:  Procedure Laterality Date   ESOPHAGEAL MANOMETRY N/A 03/15/2020   Procedure: ESOPHAGEAL MANOMETRY (EM);  Surgeon: Baldo Bonds, MD;  Location: [facility];  Service: Endoscopy;  Laterality: N/A;   EYE SURGERY     HELLER MYOTOMY N/A 04/18/2020   Procedure: LAPAROSCOPIC HELLER MYOTOMY WITH UPPER ENDOSCOPY;  Surgeon: Kinsinger, Alphonso Aschoff, MD;  Location: [facility];  Service: General;  Laterality: N/A;   PH IMPEDANCE STUDY N/A 03/15/2020   Procedure: PH IMPEDANCE STUDY;  Surgeon: Baldo Bonds, MD;  Location: [facility];  Service: Endoscopy;  Laterality: N/A;   SHOULDER SURGERY Bilateral    TONSILLECTOMY      Short Social History:   Social History   Tobacco Use   Smoking status: Never   Smokeless tobacco: Never  Substance Use Topics   Alcohol use: No    Alcohol/week: 0.0 standard drinks of alcohol    Allergies  Allergen Reactions   Beef Allergy  Anaphylaxis   Beef-Derived Drug Products Anaphylaxis   Codeine Anxiety and Hives   Iodine Anaphylaxis   Iodine-131 Anaphylaxis   Ivp Dye [Iodinated Contrast Media] Anaphylaxis and Other (See Comments)   Latex Rash   Morphine And Codeine Other (See Comments)    Tremors, increased heart rate, excitement, confusion per pt   Penicillins Hives    Has patient had a PCN reaction causing immediate rash, facial/tongue/throat swelling, SOB or lightheadedness with hypotension: Yes Has patient had a PCN reaction causing severe rash involving mucus membranes or skin necrosis: Yes Has patient had a PCN reaction that required hospitalization: Yes Has patient had a PCN reaction occurring within the last 10 years: No If all of the above answers are "NO", then may proceed with Cephalosporin use.    Sertraline Other (See Comments)    Numbness in mouth and hyperactivity   Shellfish Allergy  Hives   Solu-Medrol  [Methylprednisolone ] Other (See Comments)    Numbness and tingling in mouth   Sulfa Antibiotics Hives and Rash  Sulfasalazine Hives and Rash   Barbiturates Other (See Comments)    Excitement    Bee Pollen Hives   Bee Venom Hives   Bioflavonoid Products     Other reaction(s): Other (See Comments) Nuts, strawberries, bicarbonate of soda, Nuts, strawberries, bicarbonate of soda,   Diovan [Valsartan] Nausea Only   Gabapentin Other (See Comments)    Visual disturbance   Other Palpitations, Other (See Comments) and Rash    NARCOTIC ANALGESICS-TREMORS, INCREASED HEART RATE Hmg-Coa Reductase Inhibitors - intolerance unknown    Oxycodone Swelling, Rash and Cough   Pitavastatin  Other (See Comments) and Swelling    Facial swelling  Other Reaction(s): angioedema,  angioedema, angioedema   Promethazine-Dm Other (See Comments)    "Felt funny"   Tape Rash and Other (See Comments)    Red blotches    Wellness Essentials Blood Sugr [Nutritional Supplements] Other (See Comments)    Nuts, strawberries, bicarbonate of soda,   Albuterol  Other (See Comments)    Caused wheezing   Ciprofloxacin  Other (See Comments)   Crestor  [Rosuvastatin ] Other (See Comments)   Diltiazem Hcl     Other reaction(s): foot sores   Ezetimibe      Other reaction(s): myalgias   Famotidine  Other (See Comments)    Unknown reaction   Oxycodone Hcl     Other reaction(s): hives, rash   Pred Forte [Prednisolone Acetate] Other (See Comments)    Patient is seeing odd color and images while using this   Welchol [Colesevelam]     Other reaction(s): myalgias   Celebrex [Celecoxib] Swelling and Other (See Comments)    Swelling and numbness (mouth)   Lanolin Itching   Mobic [Meloxicam] Swelling and Other (See Comments)    Swelling and numbness in mouth   Nickel Rash   Nitrates, Organic Rash and Other (See Comments)    Chest tightness also   Petrolatum Rash and Other (See Comments)   Soy Allergy  (Obsolete) Rash   Strawberry Extract Rash    Current Outpatient Medications  Medication Sig Dispense Refill   amLODipine  (NORVASC ) 5 MG tablet Take 1 tablet (5 mg total) by mouth daily. 90 tablet 3   aspirin  EC 81 MG tablet Take 81 mg by mouth as needed.     cetirizine (ZYRTEC) 10 MG tablet Take 10 mg by mouth daily as needed for allergies.     EPINEPHrine  0.3 mg/0.3 mL IJ SOAJ injection Inject 0.3 mg into the muscle as needed for anaphylaxis. 1 each 1   Evolocumab  (REPATHA  SURECLICK) 140 MG/ML SOAJ Inject 140 mg into the skin every 14 (fourteen) days. 2 mL 11   No current facility-administered medications for this visit.    Review of Systems  Constitutional:  Constitutional negative. HENT: HENT negative.  Eyes: Eyes negative.  Cardiovascular: Positive for palpitations.  GI:  Gastrointestinal negative.  Musculoskeletal: Positive for leg pain.  Neurological: Positive for numbness.  Hematologic: Hematologic/lymphatic negative.  Psychiatric: Psychiatric negative.        Objective:  Objective   Vitals:   06/02/24 1413 06/02/24 1415  BP: (!) 154/78 (!) 162/78  Pulse: (!) 57   Temp: 97.9 F (36.6 C)   SpO2: 99%   Weight: 111 lb (50.3 kg)   Height: 5\' 5"  (1.651 m)    Body mass index is 18.47 kg/m.  Physical Exam HENT:     Head: Normocephalic.     Nose: Nose normal.  Eyes:     Pupils: Pupils are equal, round, and reactive to light.  Neck:  Vascular: No carotid bruit.  Cardiovascular:     Rate and Rhythm: Normal rate.     Pulses:          Femoral pulses are 2+ on the right side and 2+ on the left side.      Popliteal pulses are 0 on the right side and 0 on the left side.  Pulmonary:     Effort: Pulmonary effort is normal.  Abdominal:     General: Abdomen is flat.  Musculoskeletal:        General: Normal range of motion.     Cervical back: Neck supple.     Right lower leg: No edema.     Left lower leg: No edema.  Skin:    General: Skin is warm.     Capillary Refill: Capillary refill takes less than 2 seconds.  Neurological:     General: No focal deficit present.     Mental Status: She is alert.  Psychiatric:        Mood and Affect: Mood normal.        Thought Content: Thought content normal.        Judgment: Judgment normal.     Data: Right Carotid Findings:  +----------+--------+--------+--------+------------------+--------+           PSV cm/sEDV cm/sStenosisPlaque DescriptionComments  +----------+--------+--------+--------+------------------+--------+  CCA Prox  71      13              heterogenous                +----------+--------+--------+--------+------------------+--------+  CCA Distal81      18              heterogenous                 +----------+--------+--------+--------+------------------+--------+  ICA Prox  82      18      1-39%   heterogenous                +----------+--------+--------+--------+------------------+--------+  ICA Mid   67      17                                          +----------+--------+--------+--------+------------------+--------+  ICA Distal68      15                                          +----------+--------+--------+--------+------------------+--------+  ECA      86      0                                           +----------+--------+--------+--------+------------------+--------+   +----------+--------+-------+--------+-------------------+           PSV cm/sEDV cmsDescribeArm Pressure (mmHG)  +----------+--------+-------+--------+-------------------+  [id number]                    162                  +----------+--------+-------+--------+-------------------+   +---------+--------+--+--------+-+---------+  VertebralPSV cm/s26EDV cm/s4Antegrade  +---------+--------+--+--------+-+---------+      Left Carotid Findings:  +----------+--------+--------+--------+-------------------------+--------+           PSV cm/sEDV cm/sStenosisPlaque Description  Comments  +----------+--------+--------+--------+-------------------------+--------+  CCA Prox  74      12              heterogenous                       +----------+--------+--------+--------+-------------------------+--------+  CCA Distal68      14              heterogenous                       +----------+--------+--------+--------+-------------------------+--------+  ICA Prox  188     47      40-59%  heterogenous and calcific          +----------+--------+--------+--------+-------------------------+--------+  ICA Mid   60      16                                                  +----------+--------+--------+--------+-------------------------+--------+  ICA Distal77      25                                                 +----------+--------+--------+--------+-------------------------+--------+  ECA      98      4                                                  +----------+--------+--------+--------+-------------------------+--------+   +----------+--------+--------+--------+-------------------+           PSV cm/sEDV cm/sDescribeArm Pressure (mmHG)  +----------+--------+--------+--------+-------------------+  Subclavian59                     160                  +----------+--------+--------+--------+-------------------+   +---------+--------+--+--------+--+---------+  VertebralPSV cm/s70EDV cm/s19Antegrade  +---------+--------+--+--------+--+---------+        Summary:  Right Carotid: Velocities in the right ICA are consistent with a 1-39%  stenosis.   Left Carotid: Velocities in the left ICA are consistent with a 40-59%  stenosis.   Vertebrals: Bilateral vertebral arteries demonstrate antegrade flow.  Subclavians: Normal flow hemodynamics were seen in bilateral subclavian               arteries.   ABI Findings:  +---------+------------------+-----+----------+--------+  Right   Rt Pressure (mmHg)IndexWaveform  Comment   +---------+------------------+-----+----------+--------+  Brachial 162                                        +---------+------------------+-----+----------+--------+  PTA     89                0.55 monophasic          +---------+------------------+-----+----------+--------+  DP      82                0.51 monophasic          +---------+------------------+-----+----------+--------+  Anniece Kind  0.35                     +---------+------------------+-----+----------+--------+   +---------+------------------+-----+----------+-------+  Left    Lt  Pressure (mmHg)IndexWaveform  Comment  +---------+------------------+-----+----------+-------+  Brachial 160                                       +---------+------------------+-----+----------+-------+  PTA     121               0.75 monophasic         +---------+------------------+-----+----------+-------+  DP      108               0.67 monophasic         +---------+------------------+-----+----------+-------+  Great Toe120               0.74                    +---------+------------------+-----+----------+-------+   +-------+-----------+-----------+------------+------------+  ABI/TBIToday's ABIToday's TBIPrevious ABIPrevious TBI  +-------+-----------+-----------+------------+------------+  Right 0.55       0.36       0.55        0.27          +-------+-----------+-----------+------------+------------+  Left  0.75       0.74       0.78        0.46          +-------+-----------+-----------+------------+------------+       Summary:  Right: Resting right ankle-brachial index indicates moderate right lower  extremity arterial disease. The right toe-brachial index is abnormal.   Left: Resting left ankle-brachial index indicates moderate left lower  extremity arterial disease. The left toe-brachial index is normal.        Assessment/Plan:    83 year old female with stable carotid disease as well as lower extremity arterial occlusive disease.  Likely has SFA stenosis versus occlusion bilaterally but remains relatively asymptomatic from this.  As such we will continue aspirin  and I will repeat noninvasive studies in 1 year.  We discussed the signs and symptoms of stroke as well as rest pain and life-limiting claudication and she demonstrates understanding.  Short of this she will follow-up in 1 year.     Adine Hoof MD [facility]

## 2024-06-03 ENCOUNTER — Encounter: Payer: Self-pay | Admitting: Family Medicine

## 2024-06-11 DIAGNOSIS — I10 Essential (primary) hypertension: Secondary | ICD-10-CM | POA: Diagnosis not present

## 2024-06-11 DIAGNOSIS — K2100 Gastro-esophageal reflux disease with esophagitis, without bleeding: Secondary | ICD-10-CM | POA: Diagnosis not present

## 2024-06-23 DIAGNOSIS — J029 Acute pharyngitis, unspecified: Secondary | ICD-10-CM | POA: Diagnosis not present

## 2024-06-28 DIAGNOSIS — I10 Essential (primary) hypertension: Secondary | ICD-10-CM | POA: Diagnosis not present

## 2024-06-28 DIAGNOSIS — I253 Aneurysm of heart: Secondary | ICD-10-CM | POA: Diagnosis not present

## 2024-06-28 DIAGNOSIS — E1169 Type 2 diabetes mellitus with other specified complication: Secondary | ICD-10-CM | POA: Diagnosis not present

## 2024-06-28 DIAGNOSIS — M810 Age-related osteoporosis without current pathological fracture: Secondary | ICD-10-CM | POA: Diagnosis not present

## 2024-07-19 DIAGNOSIS — K220 Achalasia of cardia: Secondary | ICD-10-CM | POA: Diagnosis not present

## 2024-07-19 DIAGNOSIS — R142 Eructation: Secondary | ICD-10-CM | POA: Diagnosis not present

## 2024-07-19 DIAGNOSIS — R1310 Dysphagia, unspecified: Secondary | ICD-10-CM | POA: Diagnosis not present

## 2024-07-29 DIAGNOSIS — I253 Aneurysm of heart: Secondary | ICD-10-CM | POA: Diagnosis not present

## 2024-07-29 DIAGNOSIS — I10 Essential (primary) hypertension: Secondary | ICD-10-CM | POA: Diagnosis not present

## 2024-07-29 DIAGNOSIS — M810 Age-related osteoporosis without current pathological fracture: Secondary | ICD-10-CM | POA: Diagnosis not present

## 2024-07-29 DIAGNOSIS — E1169 Type 2 diabetes mellitus with other specified complication: Secondary | ICD-10-CM | POA: Diagnosis not present

## 2024-08-09 DIAGNOSIS — K644 Residual hemorrhoidal skin tags: Secondary | ICD-10-CM | POA: Diagnosis not present

## 2024-08-09 DIAGNOSIS — K648 Other hemorrhoids: Secondary | ICD-10-CM | POA: Diagnosis not present

## 2024-08-09 DIAGNOSIS — K5909 Other constipation: Secondary | ICD-10-CM | POA: Diagnosis not present

## 2024-08-11 ENCOUNTER — Other Ambulatory Visit: Payer: Self-pay

## 2024-08-11 ENCOUNTER — Ambulatory Visit: Attending: Family Medicine

## 2024-08-11 DIAGNOSIS — R2681 Unsteadiness on feet: Secondary | ICD-10-CM | POA: Diagnosis not present

## 2024-08-11 DIAGNOSIS — R262 Difficulty in walking, not elsewhere classified: Secondary | ICD-10-CM | POA: Diagnosis not present

## 2024-08-11 DIAGNOSIS — M6281 Muscle weakness (generalized): Secondary | ICD-10-CM | POA: Diagnosis not present

## 2024-08-11 NOTE — Therapy (Signed)
 OUTPATIENT PHYSICAL THERAPY NEURO EVALUATION   Patient Name: Donna Baldwin MRN: [id number] DOB:Dec 07, 1941, 83 y.o., female Today's Date: 08/11/2024   PCP: Teresa Channel, MD REFERRING PROVIDER: Teresa Channel, MD  END OF SESSION:  PT End of Session - 08/11/24 1007     Visit Number 1    Number of Visits 9    Date for PT Re-Evaluation 09/08/24    Authorization Type [employer]    PT Start Time 1015    PT Stop Time 1100    PT Time Calculation (min) 45 min          Past Medical History:  Diagnosis Date   Acid reflux    Anemia    Anxiety    Back pain    GERD (gastroesophageal reflux disease)    Hyperlipidemia    Hypertension    Hypertensive heart disease without heart failure    Thrombocytopenia (HCC)    Vitamin D  deficiency    Past Surgical History:  Procedure Laterality Date   ESOPHAGEAL MANOMETRY N/A 03/15/2020   Procedure: ESOPHAGEAL MANOMETRY (EM);  Surgeon: Dianna Specking, MD;  Location: [facility];  Service: Endoscopy;  Laterality: N/A;   EYE SURGERY     HELLER MYOTOMY N/A 04/18/2020   Procedure: LAPAROSCOPIC HELLER MYOTOMY WITH UPPER ENDOSCOPY;  Surgeon: Kinsinger, Herlene Righter, MD;  Location: [facility];  Service: General;  Laterality: N/A;   PH IMPEDANCE STUDY N/A 03/15/2020   Procedure: PH IMPEDANCE STUDY;  Surgeon: Dianna Specking, MD;  Location: [facility];  Service: Endoscopy;  Laterality: N/A;   SHOULDER SURGERY Bilateral    TONSILLECTOMY     Patient Active Problem List   Diagnosis Date Noted   Elevated d-dimer 01/22/2024   Diastolic dysfunction 01/22/2024   PVD (peripheral vascular disease) (HCC) 01/22/2024   Leukopenia 01/22/2024   Anxiety 01/22/2024   Weight loss 01/22/2024   Dysphagia 04/25/2023   Chest pain 08/06/2022   Bradycardia 08/05/2022   Circadian rhythm sleep disorder, advanced sleep phase type 08/07/2021   Bradycardia with 31-40 beats per minute 08/07/2021   Primary hypertension 08/07/2021   Snoring 08/07/2021    Excessive daytime sleepiness 08/07/2021   Palpitations 08/07/2021   Microcytic anemia 04/11/2021   Achalasia 04/18/2020   Abnormal CT of liver 12/20/2019   Coronary artery disease involving native coronary artery of native heart without angina pectoris 12/20/2019   Palpitation 02/08/2019   Interstitial cystitis 08/25/2018   Nonintractable headache    TIA (transient ischemic attack) 07/06/2018   History of diabetes mellitus 04/01/2017   Transient retinal artery occlusion of both eyes 07/07/2014   Hereditary and idiopathic peripheral neuropathy 07/07/2014   Aortic atherosclerosis (HCC) 12/03/2013   Mixed hyperlipidemia 12/03/2013   GERD (gastroesophageal reflux disease) 03/24/2013   Obstructive sleep apnea, ruled out 02/28/2013   Urticaria due to food allergy  08/31/2012   Thrombocytopenia (HCC)     ONSET DATE:   REFERRING DIAG: I73.9 (ICD-10-CM) - Peripheral vascular disease, unspecified  THERAPY DIAG:  Muscle weakness (generalized)  Difficulty in walking, not elsewhere classified  Unsteadiness on feet  Rationale for Evaluation and Treatment: Rehabilitation  SUBJECTIVE:  SUBJECTIVE STATEMENT: I Have neuropathy in my feet and PVD . Typically feels worse at night and the numbness is all day long and with activity have a feeling of cramping. Enjoys walking for exercise but this has become limiting and uses a bike peddler. Notes some issues with balance as she tends to shuffle and stumble. Would like to know what exercise can be done to help the pain in feet Pt accompanied by: significant other  PERTINENT HISTORY: PMH: HTN, HLD, anxiety, anemia, GERD, thrombocytopenia  PAIN:  Are you having pain? No, just numbness  PRECAUTIONS: None  RED FLAGS: None   WEIGHT BEARING RESTRICTIONS:  No  FALLS: Has patient fallen in last 6 months? No  LIVING ENVIRONMENT: Lives with: lives with their spouse Lives in: House/apartment Stairs: Yes: External: 4 steps; on right going up Has following equipment at home: Grab bars and None  PLOF: Independent  PATIENT GOALS: figure out what I can do for exercise.   OBJECTIVE:  Note: Objective measures were completed at Evaluation unless otherwise noted.  DIAGNOSTIC FINDINGS:   COGNITION: Overall cognitive status: Within functional limits for tasks assessed   SENSATION: Notes numbness more along dorsum of feet Reports increased sensory disturbance at night COORDINATION: intact  EDEMA:  none  MUSCLE TONE: WNL  MUSCLE LENGTH: WNL  DTRs:  NT  POSTURE: No Significant postural limitations  LOWER EXTREMITY ROM:     WNL  LOWER EXTREMITY MMT:    RLE: 4/5 LLE: 3+/5  BED MOBILITY:  Not tested  TRANSFERS: Independent-modified indep--requires several attempts for sit to stand from chair height when not using UE  RAMP:  Not tested  CURB:  Findings: SBA-CGA  STAIRS: Not tested GAIT: Findings: Comments: decreased speed and stride length  FUNCTIONAL TESTS:  5 times sit to stand: 30 sec 6 minute walk test: TBD 10 meter walk test: 17 sec = 1.9 ft/sec Berg Balance Test: 44/56   M-CTSIB  Condition 1: Firm Surface, EO 30 Sec, Mild and Moderate Sway  Condition 2: Firm Surface, EC 30 Sec, Moderate and Severe Sway  Condition 3: Foam Surface, EO  Sec,  Sway  Condition 4: Foam Surface, EC  Sec,  Sway                                                                                                                                   TREATMENT DATE: 08/11/24    PATIENT EDUCATION: Education details: assessment details, rationale of PT intervention and exercise in general relative to PVD, HEP initiation Person educated: Patient and Spouse Education method: Explanation and Handouts Education comprehension: verbalized  understanding and needs further education  HOME EXERCISE PROGRAM: Access Code: [email or web address] URL: [email or web address] Date: 08/11/2024 Prepared by: Burnard Sandifer  Exercises - Sit to Stand  - 1 x daily - 7 x weekly - 5 sets - 5 reps - Corner Balance Feet Together With Eyes Open  - 1 x daily - 7  x weekly - 3 sets - 30 sec hold - Corner Balance Feet Together With Eyes Closed  - 1 x daily - 7 x weekly - 3 sets - 30 sec hold  GOALS: Goals reviewed with patient? Yes  SHORT TERM GOALS: Target date: 08/25/2024    Patient will be independent in HEP to improve functional outcomes Baseline: Goal status: INITIAL    LONG TERM GOALS: Target date: 09/08/2024    Demo improved BLE strength and reduced risk for falls per time 20 sec 5xSTS test Baseline: 30 sec, requires multiple attempts for STS Goal status: INITIAL  2.  Demo improved balance and reduce risk for falls per score 48/56 Berg Balance Test Baseline: 44/56 Goal status: INITIAL  3.  to be determined Baseline:  Goal status: INITIAL  4.  Demo improved postural stability per mild sway x 30 sec condition 2 M-CTSIB for improved safety during ADL Baseline: 30 sec mod-severe Goal status: INITIAL    ASSESSMENT:  CLINICAL IMPRESSION: Patient is a 83 y.o. lady who was seen today for physical therapy evaluation and treatment for Peripheral vascular disease, unspecified.  Pt reports limited activity tolerance/endurance with report of vascular claudication that limits degree of ambulation and increased pain response in the evening. Exhibits generalized weakness BLE LLE>RLE which creates difficulty with transfers from lower seat heights.  Balance deficits w/ high risk for falls per outcome measures and decreased postural stability evident by +Romberg test.  Pt would benefit from PT services to provide with relevant intervention, compensation, and adaptive strategies to improve mobility, activity tolerance, and reduce risk fro  falls   OBJECTIVE IMPAIRMENTS: Abnormal gait, decreased activity tolerance, decreased balance, decreased endurance, decreased knowledge of use of DME, decreased mobility, difficulty walking, decreased strength, and pain.   ACTIVITY LIMITATIONS: carrying, sleeping, transfers, and locomotion level  PARTICIPATION LIMITATIONS: shopping and community activity  PERSONAL FACTORS: Age, Time since onset of injury/illness/exacerbation, and 1 comorbidity: PMH are also affecting patient's functional outcome.   REHAB POTENTIAL: Excellent  CLINICAL DECISION MAKING: Evolving/moderate complexity  EVALUATION COMPLEXITY: Moderate  PLAN:  PT FREQUENCY: 2x/week  PT DURATION: 4 weeks  PLANNED INTERVENTIONS: 97750- Physical Performance Testing, 97110-Therapeutic exercises, 97530- Therapeutic activity, [id number]- Neuromuscular re-education, 97535- Self Care, [id number]- Manual therapy, [id number]- Gait training, [phone]- Aquatic Therapy, and [phone]- Electrical stimulation (unattended)  PLAN FOR NEXT SESSION: and goal, HEP review, progressions for LE muscular endurance as she indicates vascular claudication/neuropathy   12:52 PM, 08/11/24 M. Kelly Burna Atlas, PT, DPT Physical Therapist- [place] Office Number: [phone]

## 2024-08-12 ENCOUNTER — Other Ambulatory Visit: Payer: Medicare PPO

## 2024-08-17 ENCOUNTER — Encounter: Payer: Self-pay | Admitting: Physical Therapy

## 2024-08-17 ENCOUNTER — Ambulatory Visit: Admitting: Physical Therapy

## 2024-08-17 DIAGNOSIS — M6281 Muscle weakness (generalized): Secondary | ICD-10-CM

## 2024-08-17 DIAGNOSIS — R262 Difficulty in walking, not elsewhere classified: Secondary | ICD-10-CM

## 2024-08-17 DIAGNOSIS — R2681 Unsteadiness on feet: Secondary | ICD-10-CM

## 2024-08-17 NOTE — Therapy (Signed)
 OUTPATIENT PHYSICAL THERAPY NEURO EVALUATION   Patient Name: Donna Baldwin MRN: [id number] DOB:07/18/1941, 83 y.o., female Today's Date: 08/17/2024   PCP: Teresa Channel, MD REFERRING PROVIDER: Teresa Channel, MD  END OF SESSION:  PT End of Session - 08/17/24 1147     Visit Number 2    Number of Visits 9    Date for PT Re-Evaluation 09/08/24    Authorization Type [employer]    PT Start Time 1147    PT Stop Time 1225    PT Time Calculation (min) 38 min          Past Medical History:  Diagnosis Date   Acid reflux    Anemia    Anxiety    Back pain    GERD (gastroesophageal reflux disease)    Hyperlipidemia    Hypertension    Hypertensive heart disease without heart failure    Thrombocytopenia (HCC)    Vitamin D  deficiency    Past Surgical History:  Procedure Laterality Date   ESOPHAGEAL MANOMETRY N/A 03/15/2020   Procedure: ESOPHAGEAL MANOMETRY (EM);  Surgeon: Dianna Specking, MD;  Location: [facility];  Service: Endoscopy;  Laterality: N/A;   EYE SURGERY     HELLER MYOTOMY N/A 04/18/2020   Procedure: LAPAROSCOPIC HELLER MYOTOMY WITH UPPER ENDOSCOPY;  Surgeon: Kinsinger, Herlene Righter, MD;  Location: [facility];  Service: General;  Laterality: N/A;   PH IMPEDANCE STUDY N/A 03/15/2020   Procedure: PH IMPEDANCE STUDY;  Surgeon: Dianna Specking, MD;  Location: [facility];  Service: Endoscopy;  Laterality: N/A;   SHOULDER SURGERY Bilateral    TONSILLECTOMY     Patient Active Problem List   Diagnosis Date Noted   Elevated d-dimer 01/22/2024   Diastolic dysfunction 01/22/2024   PVD (peripheral vascular disease) (HCC) 01/22/2024   Leukopenia 01/22/2024   Anxiety 01/22/2024   Weight loss 01/22/2024   Dysphagia 04/25/2023   Chest pain 08/06/2022   Bradycardia 08/05/2022   Circadian rhythm sleep disorder, advanced sleep phase type 08/07/2021   Bradycardia with 31-40 beats per minute 08/07/2021   Primary hypertension 08/07/2021   Snoring 08/07/2021    Excessive daytime sleepiness 08/07/2021   Palpitations 08/07/2021   Microcytic anemia 04/11/2021   Achalasia 04/18/2020   Abnormal CT of liver 12/20/2019   Coronary artery disease involving native coronary artery of native heart without angina pectoris 12/20/2019   Palpitation 02/08/2019   Interstitial cystitis 08/25/2018   Nonintractable headache    TIA (transient ischemic attack) 07/06/2018   History of diabetes mellitus 04/01/2017   Transient retinal artery occlusion of both eyes 07/07/2014   Hereditary and idiopathic peripheral neuropathy 07/07/2014   Aortic atherosclerosis (HCC) 12/03/2013   Mixed hyperlipidemia 12/03/2013   GERD (gastroesophageal reflux disease) 03/24/2013   Obstructive sleep apnea, ruled out 02/28/2013   Urticaria due to food allergy  08/31/2012   Thrombocytopenia (HCC)     ONSET DATE:   REFERRING DIAG: I73.9 (ICD-10-CM) - Peripheral vascular disease, unspecified  THERAPY DIAG:  Muscle weakness (generalized)  Difficulty in walking, not elsewhere classified  Unsteadiness on feet  Rationale for Evaluation and Treatment: Rehabilitation  SUBJECTIVE:  SUBJECTIVE STATEMENT: Pt states her numbness is usually worse in the morning.   From eval: I Have neuropathy in my feet and PVD . Typically feels worse at night and the numbness is all day long and with activity have a feeling of cramping. Enjoys walking for exercise but this has become limiting and uses a bike peddler. Notes some issues with balance as she tends to shuffle and stumble. Would like to know what exercise can be done to help the pain in feet Pt accompanied by: significant other  PERTINENT HISTORY: PMH: HTN, HLD, anxiety, anemia, GERD, thrombocytopenia  PAIN:  Are you having pain? No, just  numbness  PRECAUTIONS: None  RED FLAGS: None   WEIGHT BEARING RESTRICTIONS: No  FALLS: Has patient fallen in last 6 months? No  LIVING ENVIRONMENT: Lives with: lives with their spouse Lives in: House/apartment Stairs: Yes: External: 4 steps; on right going up Has following equipment at home: Grab bars and None  PLOF: Independent  PATIENT GOALS: figure out what I can do for exercise.   OBJECTIVE:  Note: Objective measures were completed at Evaluation unless otherwise noted.  DIAGNOSTIC FINDINGS:   COGNITION: Overall cognitive status: Within functional limits for tasks assessed   SENSATION: Notes numbness more along dorsum of feet Reports increased sensory disturbance at night COORDINATION: intact  EDEMA:  none  MUSCLE TONE: WNL  MUSCLE LENGTH: WNL  DTRs:  NT  POSTURE: No Significant postural limitations  LOWER EXTREMITY ROM:     WNL  LOWER EXTREMITY MMT:    RLE: 4/5 LLE: 3+/5  BED MOBILITY:  Not tested  TRANSFERS: Independent-modified indep--requires several attempts for sit to stand from chair height when not using UE  RAMP:  Not tested  CURB:  Findings: SBA-CGA  STAIRS: Not tested GAIT: Findings: Comments: decreased speed and stride length  FUNCTIONAL TESTS:  5 times sit to stand: 30 sec 6 minute walk test: TBD 10 meter walk test: 17 sec = 1.9 ft/sec Berg Balance Test: 44/56   M-CTSIB  Condition 1: Firm Surface, EO 30 Sec, Mild and Moderate Sway  Condition 2: Firm Surface, EC 30 Sec, Moderate and Severe Sway  Condition 3: Foam Surface, EO  Sec,  Sway  Condition 4: Foam Surface, EC  Sec,  Sway                                                                                                                                   TREATMENT DATE: 08/17/24  Activity Comment  6 MWT At 3 min rated 5/10 RPE After 6 min rated 8/10 RPE, 107/81, 96 BPM, 94% spO2 779 feet  Feet together on firm surface: EO static balance x30 EO static  balance with head turns and nods 2x30 EC static balance 2x30     Multiple minor LOBs  Standing: Diagonals 2# x10 Hip abductions x10 Hip extension 2x10 Heel/toe raise x20       PATIENT EDUCATION:  Education details: HEP updates/modifications Person educated: Patient and Spouse Education method: Chief Technology Officer Education comprehension: verbalized understanding and needs further education  HOME EXERCISE PROGRAM: Access Code: [email or web address] URL: [email or web address] Date: 08/11/2024 Prepared by: Burnard Sandifer  Exercises - Sit to Stand  - 1 x daily - 7 x weekly - 5 sets - 5 reps - Corner Balance Feet Together With Eyes Open  - 1 x daily - 7 x weekly - 3 sets - 30 sec hold - Corner Balance Feet Together With Eyes Closed  - 1 x daily - 7 x weekly - 3 sets - 30 sec hold  GOALS: Goals reviewed with patient? Yes  SHORT TERM GOALS: Target date: 08/25/2024    Patient will be independent in HEP to improve functional outcomes Baseline: Goal status: INITIAL    LONG TERM GOALS: Target date: 09/08/2024    Demo improved BLE strength and reduced risk for falls per time 20 sec 5xSTS test Baseline: 30 sec, requires multiple attempts for STS Goal status: INITIAL  2.  Demo improved balance and reduce risk for falls per score 48/56 Berg Balance Test Baseline: 44/56 Goal status: INITIAL  3.  to be determined Baseline:  Goal status: INITIAL  4.  Demo improved postural stability per mild sway x 30 sec condition 2 M-CTSIB for improved safety during ADL Baseline: 30 sec mod-severe Goal status: INITIAL    ASSESSMENT:  CLINICAL IMPRESSION: Administered today. Added strengthening to pt's HEP. Progressed her balance HEP as tolerated. Multiple rest breaks due to fatigue.   From eval: Patient is a 83 y.o. lady who was seen today for physical therapy evaluation and treatment for Peripheral vascular disease, unspecified.  Pt reports limited activity  tolerance/endurance with report of vascular claudication that limits degree of ambulation and increased pain response in the evening. Exhibits generalized weakness BLE LLE>RLE which creates difficulty with transfers from lower seat heights.  Balance deficits w/ high risk for falls per outcome measures and decreased postural stability evident by +Romberg test.  Pt would benefit from PT services to provide with relevant intervention, compensation, and adaptive strategies to improve mobility, activity tolerance, and reduce risk fro falls   OBJECTIVE IMPAIRMENTS: Abnormal gait, decreased activity tolerance, decreased balance, decreased endurance, decreased knowledge of use of DME, decreased mobility, difficulty walking, decreased strength, and pain.   ACTIVITY LIMITATIONS: carrying, sleeping, transfers, and locomotion level  PARTICIPATION LIMITATIONS: shopping and community activity  PERSONAL FACTORS: Age, Time since onset of injury/illness/exacerbation, and 1 comorbidity: PMH are also affecting patient's functional outcome.   REHAB POTENTIAL: Excellent  CLINICAL DECISION MAKING: Evolving/moderate complexity  EVALUATION COMPLEXITY: Moderate  PLAN:  PT FREQUENCY: 2x/week  PT DURATION: 4 weeks  PLANNED INTERVENTIONS: 97750- Physical Performance Testing, 97110-Therapeutic exercises, 97530- Therapeutic activity, [id number]- Neuromuscular re-education, 97535- Self Care, 02859- Manual therapy, [phone]- Gait training, [phone]- Aquatic Therapy, and [phone]- Electrical stimulation (unattended)  PLAN FOR NEXT SESSION: HEP review, progressions for LE muscular endurance as she indicates vascular claudication/neuropathy   11:47 AM, 08/17/24 Makelle Marrone April Ma L Alda Gaultney, PT, DPT Physical Therapist- [place] Office Number: [phone]

## 2024-08-19 ENCOUNTER — Ambulatory Visit

## 2024-08-19 DIAGNOSIS — M6281 Muscle weakness (generalized): Secondary | ICD-10-CM

## 2024-08-19 DIAGNOSIS — R262 Difficulty in walking, not elsewhere classified: Secondary | ICD-10-CM

## 2024-08-19 DIAGNOSIS — R2681 Unsteadiness on feet: Secondary | ICD-10-CM

## 2024-08-19 NOTE — Therapy (Signed)
 OUTPATIENT PHYSICAL THERAPY NEURO TREATMENT   Patient Name: Donna Baldwin MRN: [id number] DOB:1941-11-10, 83 y.o., female Today's Date: 08/19/2024   PCP: Teresa Channel, MD REFERRING PROVIDER: Teresa Channel, MD  END OF SESSION:  PT End of Session - 08/19/24 1152     Visit Number 3    Number of Visits 9    Date for PT Re-Evaluation 09/08/24    Authorization Type [employer]    PT Start Time 1150    PT Stop Time 1230    PT Time Calculation (min) 40 min          Past Medical History:  Diagnosis Date   Acid reflux    Anemia    Anxiety    Back pain    GERD (gastroesophageal reflux disease)    Hyperlipidemia    Hypertension    Hypertensive heart disease without heart failure    Thrombocytopenia (HCC)    Vitamin D  deficiency    Past Surgical History:  Procedure Laterality Date   ESOPHAGEAL MANOMETRY N/A 03/15/2020   Procedure: ESOPHAGEAL MANOMETRY (EM);  Surgeon: Dianna Specking, MD;  Location: [facility];  Service: Endoscopy;  Laterality: N/A;   EYE SURGERY     HELLER MYOTOMY N/A 04/18/2020   Procedure: LAPAROSCOPIC HELLER MYOTOMY WITH UPPER ENDOSCOPY;  Surgeon: Kinsinger, Herlene Righter, MD;  Location: [facility];  Service: General;  Laterality: N/A;   PH IMPEDANCE STUDY N/A 03/15/2020   Procedure: PH IMPEDANCE STUDY;  Surgeon: Dianna Specking, MD;  Location: [facility];  Service: Endoscopy;  Laterality: N/A;   SHOULDER SURGERY Bilateral    TONSILLECTOMY     Patient Active Problem List   Diagnosis Date Noted   Elevated d-dimer 01/22/2024   Diastolic dysfunction 01/22/2024   PVD (peripheral vascular disease) (HCC) 01/22/2024   Leukopenia 01/22/2024   Anxiety 01/22/2024   Weight loss 01/22/2024   Dysphagia 04/25/2023   Chest pain 08/06/2022   Bradycardia 08/05/2022   Circadian rhythm sleep disorder, advanced sleep phase type 08/07/2021   Bradycardia with 31-40 beats per minute 08/07/2021   Primary hypertension 08/07/2021   Snoring 08/07/2021    Excessive daytime sleepiness 08/07/2021   Palpitations 08/07/2021   Microcytic anemia 04/11/2021   Achalasia 04/18/2020   Abnormal CT of liver 12/20/2019   Coronary artery disease involving native coronary artery of native heart without angina pectoris 12/20/2019   Palpitation 02/08/2019   Interstitial cystitis 08/25/2018   Nonintractable headache    TIA (transient ischemic attack) 07/06/2018   History of diabetes mellitus 04/01/2017   Transient retinal artery occlusion of both eyes 07/07/2014   Hereditary and idiopathic peripheral neuropathy 07/07/2014   Aortic atherosclerosis (HCC) 12/03/2013   Mixed hyperlipidemia 12/03/2013   GERD (gastroesophageal reflux disease) 03/24/2013   Obstructive sleep apnea, ruled out 02/28/2013   Urticaria due to food allergy  08/31/2012   Thrombocytopenia (HCC)     ONSET DATE:   REFERRING DIAG: I73.9 (ICD-10-CM) - Peripheral vascular disease, unspecified  THERAPY DIAG:  Muscle weakness (generalized)  Difficulty in walking, not elsewhere classified  Unsteadiness on feet  Rationale for Evaluation and Treatment: Rehabilitation  SUBJECTIVE:  SUBJECTIVE STATEMENT: Pt states her numbness is usually worse in the morning. Took a bath with epsom salts which seems to help  From eval: I Have neuropathy in my feet and PVD . Typically feels worse at night and the numbness is all day long and with activity have a feeling of cramping. Enjoys walking for exercise but this has become limiting and uses a bike peddler. Notes some issues with balance as she tends to shuffle and stumble. Would like to know what exercise can be done to help the pain in feet Pt accompanied by: significant other  PERTINENT HISTORY: PMH: HTN, HLD, anxiety, anemia, GERD, thrombocytopenia  PAIN:   Are you having pain? No, just numbness  PRECAUTIONS: None  RED FLAGS: None   WEIGHT BEARING RESTRICTIONS: No  FALLS: Has patient fallen in last 6 months? No  LIVING ENVIRONMENT: Lives with: lives with their spouse Lives in: House/apartment Stairs: Yes: External: 4 steps; on right going up Has following equipment at home: Grab bars and None  PLOF: Independent  PATIENT GOALS: figure out what I can do for exercise.   OBJECTIVE:   TODAY'S TREATMENT: 08/19/24 Activity Comments  NU-step level 3 x 8 min, seat position and arms at #9 Steady state to improve cardiovascular fitness/circulation  Calf raise 2x10 At counter  Mini-squats 2x10 At counter  HR 59 bpm   Static balance on compliant EO x 60 sec EC 2x30 sec EO w/ head movements x 60 sec       TREATMENT DATE: 08/17/24  Activity Comment  6 MWT At 3 min rated 5/10 RPE After 6 min rated 8/10 RPE, 107/81, 96 BPM, 94% spO2 779 feet  Feet together on firm surface: EO static balance x30 EO static balance with head turns and nods 2x30 EC static balance 2x30     Multiple minor LOBs  Standing: Diagonals 2# x10 Hip abductions x10 Hip extension 2x10 Heel/toe raise x20    PATIENT EDUCATION: Education details: HEP updates/modifications Person educated: Patient and Spouse Education method: Explanation and Handouts Education comprehension: verbalized understanding and needs further education  HOME EXERCISE PROGRAM: Access Code: [email or web address] URL: [email or web address] Date: 08/11/2024 Prepared by: Burnard Sandifer  Exercises - Sit to Stand  - 1 x daily - 7 x weekly - 5 sets - 5 reps - Corner Balance Feet Together With Eyes Open  - 1 x daily - 7 x weekly - 3 sets - 30 sec hold - Corner Balance Feet Together With Eyes Closed  - 1 x daily - 7 x weekly - 3 sets - 30 sec hold - Romberg Stance with Head Nods  - 1 x daily - 7 x weekly - 3 sets - 30 sec hold - Romberg Stance with Head Rotation  - 1 x daily - 7 x  weekly - 3 sets - 30 sec hold - Standing Diagonal Chops with Medicine Ball  - 1 x daily - 7 x weekly - 2 sets - 10 reps - Standing Hip Abduction with Counter Support  - 1 x daily - 7 x weekly - 2 sets - 10 reps - Standing Hip Extension with Counter Support  - 1 x daily - 7 x weekly - 2 sets - 10 reps - Heel Raises with Counter Support  - 2-3 x weekly - 3 sets - 10 reps - Mini Squat with Counter Support  - 2-3 x weekly - 3 sets - 10 reps  Note: Objective measures were completed at Evaluation unless otherwise noted.  DIAGNOSTIC FINDINGS:  COGNITION: Overall cognitive status: Within functional limits for tasks assessed   SENSATION: Notes numbness more along dorsum of feet Reports increased sensory disturbance at night COORDINATION: intact  EDEMA:  none  MUSCLE TONE: WNL  MUSCLE LENGTH: WNL  DTRs:  NT  POSTURE: No Significant postural limitations  LOWER EXTREMITY ROM:     WNL  LOWER EXTREMITY MMT:    RLE: 4/5 LLE: 3+/5  BED MOBILITY:  Not tested  TRANSFERS: Independent-modified indep--requires several attempts for sit to stand from chair height when not using UE  RAMP:  Not tested  CURB:  Findings: SBA-CGA  STAIRS: Not tested GAIT: Findings: Comments: decreased speed and stride length  FUNCTIONAL TESTS:  5 times sit to stand: 30 sec 6 minute walk test: TBD 10 meter walk test: 17 sec = 1.9 ft/sec Berg Balance Test: 44/56   M-CTSIB  Condition 1: Firm Surface, EO 30 Sec, Mild and Moderate Sway  Condition 2: Firm Surface, EC 30 Sec, Moderate and Severe Sway  Condition 3: Foam Surface, EO  Sec,  Sway  Condition 4: Foam Surface, EC  Sec,  Sway                                                                                                                                          GOALS: Goals reviewed with patient? Yes  SHORT TERM GOALS: Target date: 08/25/2024    Patient will be independent in HEP to improve functional  outcomes Baseline: Goal status: INITIAL    LONG TERM GOALS: Target date: 09/08/2024    Demo improved BLE strength and reduced risk for falls per time 20 sec 5xSTS test Baseline: 30 sec, requires multiple attempts for STS Goal status: INITIAL  2.  Demo improved balance and reduce risk for falls per score 48/56 Berg Balance Test Baseline: 44/56 Goal status: INITIAL  3.  to be determined Baseline:  Goal status: INITIAL  4.  Demo improved postural stability per mild sway x 30 sec condition 2 M-CTSIB for improved safety during ADL Baseline: 30 sec mod-severe Goal status: INITIAL    ASSESSMENT:  CLINICAL IMPRESSION: Good tolerance to NU-step without report of claudication symptoms. Progressed LE PRE to improve strength and activity tolerance for enhanced mobility with additions to HEP. Static balance on compliant surfaces to improve proprioceptive awareness. Continued sessions to progress POC details  From eval: Patient is a 83 y.o. lady who was seen today for physical therapy evaluation and treatment for Peripheral vascular disease, unspecified.  Pt reports limited activity tolerance/endurance with report of vascular claudication that limits degree of ambulation and increased pain response in the evening. Exhibits generalized weakness BLE LLE>RLE which creates difficulty with transfers from lower seat heights.  Balance deficits w/ high risk for falls per outcome measures and decreased postural stability evident by +Romberg test.  Pt would benefit from PT services to provide with relevant intervention, compensation, and  adaptive strategies to improve mobility, activity tolerance, and reduce risk fro falls   OBJECTIVE IMPAIRMENTS: Abnormal gait, decreased activity tolerance, decreased balance, decreased endurance, decreased knowledge of use of DME, decreased mobility, difficulty walking, decreased strength, and pain.   ACTIVITY LIMITATIONS: carrying, sleeping, transfers, and  locomotion level  PARTICIPATION LIMITATIONS: shopping and community activity  PERSONAL FACTORS: Age, Time since onset of injury/illness/exacerbation, and 1 comorbidity: PMH are also affecting patient's functional outcome.   REHAB POTENTIAL: Excellent  CLINICAL DECISION MAKING: Evolving/moderate complexity  EVALUATION COMPLEXITY: Moderate  PLAN:  PT FREQUENCY: 2x/week  PT DURATION: 4 weeks  PLANNED INTERVENTIONS: 97750- Physical Performance Testing, 97110-Therapeutic exercises, 97530- Therapeutic activity, 97112- Neuromuscular re-education, 97535- Self Care, 02859- Manual therapy, [phone]- Gait training, [phone]- Aquatic Therapy, and [phone]- Electrical stimulation (unattended)  PLAN FOR NEXT SESSION: HEP review, progressions for LE muscular endurance as she indicates vascular claudication/neuropathy   11:52 AM, 08/19/24 Jonette MARLA Sandifer, PT, DPT Physical Therapist- [place] Office Number: [phone]

## 2024-08-24 ENCOUNTER — Ambulatory Visit

## 2024-08-25 ENCOUNTER — Encounter: Payer: Self-pay | Admitting: Podiatry

## 2024-08-25 ENCOUNTER — Ambulatory Visit: Admitting: Podiatry

## 2024-08-25 DIAGNOSIS — G609 Hereditary and idiopathic neuropathy, unspecified: Secondary | ICD-10-CM | POA: Diagnosis not present

## 2024-08-25 DIAGNOSIS — M79675 Pain in left toe(s): Secondary | ICD-10-CM

## 2024-08-25 DIAGNOSIS — M79674 Pain in right toe(s): Secondary | ICD-10-CM | POA: Diagnosis not present

## 2024-08-25 DIAGNOSIS — E119 Type 2 diabetes mellitus without complications: Secondary | ICD-10-CM | POA: Diagnosis not present

## 2024-08-25 DIAGNOSIS — B351 Tinea unguium: Secondary | ICD-10-CM

## 2024-08-26 ENCOUNTER — Ambulatory Visit

## 2024-08-26 DIAGNOSIS — M6281 Muscle weakness (generalized): Secondary | ICD-10-CM | POA: Diagnosis not present

## 2024-08-26 DIAGNOSIS — R262 Difficulty in walking, not elsewhere classified: Secondary | ICD-10-CM

## 2024-08-26 DIAGNOSIS — R2681 Unsteadiness on feet: Secondary | ICD-10-CM

## 2024-08-26 NOTE — Therapy (Signed)
 OUTPATIENT PHYSICAL THERAPY NEURO TREATMENT   Patient Name: Donna Baldwin MRN: [id number] DOB:Apr 11, 1941, 83 y.o., female Today's Date: 08/26/2024   PCP: Teresa Channel, MD REFERRING PROVIDER: Teresa Channel, MD  END OF SESSION:  PT End of Session - 08/26/24 1159     Visit Number 4    Number of Visits 9    Date for PT Re-Evaluation 09/08/24    Authorization Type [employer]    PT Start Time 1150    PT Stop Time 1235    PT Time Calculation (min) 45 min          Past Medical History:  Diagnosis Date   Acid reflux    Anemia    Anxiety    Back pain    GERD (gastroesophageal reflux disease)    Hyperlipidemia    Hypertension    Hypertensive heart disease without heart failure    Thrombocytopenia (HCC)    Vitamin D  deficiency    Past Surgical History:  Procedure Laterality Date   ESOPHAGEAL MANOMETRY N/A 03/15/2020   Procedure: ESOPHAGEAL MANOMETRY (EM);  Surgeon: Dianna Specking, MD;  Location: [facility];  Service: Endoscopy;  Laterality: N/A;   EYE SURGERY     HELLER MYOTOMY N/A 04/18/2020   Procedure: LAPAROSCOPIC HELLER MYOTOMY WITH UPPER ENDOSCOPY;  Surgeon: Kinsinger, Herlene Righter, MD;  Location: [facility];  Service: General;  Laterality: N/A;   PH IMPEDANCE STUDY N/A 03/15/2020   Procedure: PH IMPEDANCE STUDY;  Surgeon: Dianna Specking, MD;  Location: [facility];  Service: Endoscopy;  Laterality: N/A;   SHOULDER SURGERY Bilateral    TONSILLECTOMY     Patient Active Problem List   Diagnosis Date Noted   Elevated d-dimer 01/22/2024   Diastolic dysfunction 01/22/2024   PVD (peripheral vascular disease) (HCC) 01/22/2024   Leukopenia 01/22/2024   Anxiety 01/22/2024   Weight loss 01/22/2024   Dysphagia 04/25/2023   Chest pain 08/06/2022   Bradycardia 08/05/2022   Circadian rhythm sleep disorder, advanced sleep phase type 08/07/2021   Bradycardia with 31-40 beats per minute 08/07/2021   Primary hypertension 08/07/2021   Snoring 08/07/2021    Excessive daytime sleepiness 08/07/2021   Palpitations 08/07/2021   Microcytic anemia 04/11/2021   Achalasia 04/18/2020   Abnormal CT of liver 12/20/2019   Coronary artery disease involving native coronary artery of native heart without angina pectoris 12/20/2019   Palpitation 02/08/2019   Interstitial cystitis 08/25/2018   Nonintractable headache    TIA (transient ischemic attack) 07/06/2018   History of diabetes mellitus 04/01/2017   Transient retinal artery occlusion of both eyes 07/07/2014   Hereditary and idiopathic peripheral neuropathy 07/07/2014   Aortic atherosclerosis (HCC) 12/03/2013   Mixed hyperlipidemia 12/03/2013   GERD (gastroesophageal reflux disease) 03/24/2013   Obstructive sleep apnea, ruled out 02/28/2013   Urticaria due to food allergy  08/31/2012   Thrombocytopenia (HCC)     ONSET DATE:   REFERRING DIAG: I73.9 (ICD-10-CM) - Peripheral vascular disease, unspecified  THERAPY DIAG:  Muscle weakness (generalized)  Difficulty in walking, not elsewhere classified  Unsteadiness on feet  Rationale for Evaluation and Treatment: Rehabilitation  SUBJECTIVE:  SUBJECTIVE STATEMENT: Notes ongoing issues of leg pain mostly at night  From eval: I Have neuropathy in my feet and PVD . Typically feels worse at night and the numbness is all day long and with activity have a feeling of cramping. Enjoys walking for exercise but this has become limiting and uses a bike peddler. Notes some issues with balance as she tends to shuffle and stumble. Would like to know what exercise can be done to help the pain in feet Pt accompanied by: significant other  PERTINENT HISTORY: PMH: HTN, HLD, anxiety, anemia, GERD, thrombocytopenia  PAIN:  Are you having pain? No, just numbness  PRECAUTIONS:  None  RED FLAGS: None   WEIGHT BEARING RESTRICTIONS: No  FALLS: Has patient fallen in last 6 months? No  LIVING ENVIRONMENT: Lives with: lives with their spouse Lives in: House/apartment Stairs: Yes: External: 4 steps; on right going up Has following equipment at home: Grab bars and None  PLOF: Independent  PATIENT GOALS: figure out what I can do for exercise.   OBJECTIVE:   TODAY'S TREATMENT: 08/26/24 Activity Comments  NU-step level 4 x 8 min, seat position and arms at #9   HEP review 100% recall/return demonstration  Discussed progressive walking program per ACC/AHA and [place] guidelines walking to near-maximal claudication pain, rest, then repeat, for 30-60 minutes, 3x/week, for at least 12 weeks.  Rocker board balance x 60 sec -lateral -ant/post           TODAY'S TREATMENT: 08/19/24 Activity Comments  NU-step level 3 x 8 min, seat position and arms at #9 Steady state to improve cardiovascular fitness/circulation  Calf raise 2x10 At counter  Mini-squats 2x10 At counter  HR 59 bpm   Static balance on compliant EO x 60 sec EC 2x30 sec EO w/ head movements x 60 sec       TREATMENT DATE: 08/17/24  Activity Comment  6 MWT At 3 min rated 5/10 RPE After 6 min rated 8/10 RPE, 107/81, 96 BPM, 94% spO2 779 feet  Feet together on firm surface: EO static balance x30 EO static balance with head turns and nods 2x30 EC static balance 2x30     Multiple minor LOBs  Standing: Diagonals 2# x10 Hip abductions x10 Hip extension 2x10 Heel/toe raise x20    PATIENT EDUCATION: Education details: HEP updates/modifications Person educated: Patient and Spouse Education method: Explanation and Handouts Education comprehension: verbalized understanding and needs further education  HOME EXERCISE PROGRAM: Access Code: [email or web address] URL: [email or web address] Date: 08/11/2024 Prepared by: Burnard Sandifer  Exercises - Sit to Stand  - 1 x daily - 7 x weekly - 5  sets - 5 reps - Corner Balance Feet Together With Eyes Open  - 1 x daily - 7 x weekly - 3 sets - 30 sec hold - Corner Balance Feet Together With Eyes Closed  - 1 x daily - 7 x weekly - 3 sets - 30 sec hold - Romberg Stance with Head Nods  - 1 x daily - 7 x weekly - 3 sets - 30 sec hold - Romberg Stance with Head Rotation  - 1 x daily - 7 x weekly - 3 sets - 30 sec hold - Standing Diagonal Chops with Medicine Ball  - 1 x daily - 7 x weekly - 2 sets - 10 reps - Standing Hip Abduction with Counter Support  - 1 x daily - 7 x weekly - 2 sets - 10 reps - Standing Hip Extension with Counter Support  - 1  x daily - 7 x weekly - 2 sets - 10 reps - Heel Raises with Counter Support  - 2-3 x weekly - 3 sets - 10 reps - Mini Squat with Counter Support  - 2-3 x weekly - 3 sets - 10 reps  Note: Objective measures were completed at Evaluation unless otherwise noted.  DIAGNOSTIC FINDINGS:   COGNITION: Overall cognitive status: Within functional limits for tasks assessed   SENSATION: Notes numbness more along dorsum of feet Reports increased sensory disturbance at night COORDINATION: intact  EDEMA:  none  MUSCLE TONE: WNL  MUSCLE LENGTH: WNL  DTRs:  NT  POSTURE: No Significant postural limitations  LOWER EXTREMITY ROM:     WNL  LOWER EXTREMITY MMT:    RLE: 4/5 LLE: 3+/5  BED MOBILITY:  Not tested  TRANSFERS: Independent-modified indep--requires several attempts for sit to stand from chair height when not using UE  RAMP:  Not tested  CURB:  Findings: SBA-CGA  STAIRS: Not tested GAIT: Findings: Comments: decreased speed and stride length  FUNCTIONAL TESTS:  5 times sit to stand: 30 sec 6 minute walk test: TBD 10 meter walk test: 17 sec = 1.9 ft/sec Berg Balance Test: 44/56   M-CTSIB  Condition 1: Firm Surface, EO 30 Sec, Mild and Moderate Sway  Condition 2: Firm Surface, EC 30 Sec, Moderate and Severe Sway  Condition 3: Foam Surface, EO  Sec,  Sway  Condition 4:  Foam Surface, EC  Sec,  Sway                                                                                                                                          GOALS: Goals reviewed with patient? Yes  SHORT TERM GOALS: Target date: 08/25/2024    Patient will be independent in HEP to improve functional outcomes Baseline: Goal status: MET    LONG TERM GOALS: Target date: 09/08/2024    Demo improved BLE strength and reduced risk for falls per time 20 sec 5xSTS test Baseline: 30 sec, requires multiple attempts for STS Goal status: INITIAL  2.  Demo improved balance and reduce risk for falls per score 48/56 Berg Balance Test Baseline: 44/56 Goal status: INITIAL  3.  to be determined Baseline:  Goal status: INITIAL  4.  Demo improved postural stability per mild sway x 30 sec condition 2 M-CTSIB for improved safety during ADL Baseline: 30 sec mod-severe Goal status: INITIAL    ASSESSMENT:  CLINICAL IMPRESSION: Continued with low impact cardiovascular exercises via NU-step for improved LE endurance/circulatory benefit with good tolerance to increased resistance and no instance of claudication reported. HEP review to address balance and LE strength deficits with excellent return demonstration. Education regarding recommendations of progressive walking program as it pertains to PAD/PVD and provided written instruction for performance.   From eval: Patient is a 83 y.o. lady who was seen today for physical  therapy evaluation and treatment for Peripheral vascular disease, unspecified.  Pt reports limited activity tolerance/endurance with report of vascular claudication that limits degree of ambulation and increased pain response in the evening. Exhibits generalized weakness BLE LLE>RLE which creates difficulty with transfers from lower seat heights.  Balance deficits w/ high risk for falls per outcome measures and decreased postural stability evident by +Romberg test.   Pt would benefit from PT services to provide with relevant intervention, compensation, and adaptive strategies to improve mobility, activity tolerance, and reduce risk fro falls   OBJECTIVE IMPAIRMENTS: Abnormal gait, decreased activity tolerance, decreased balance, decreased endurance, decreased knowledge of use of DME, decreased mobility, difficulty walking, decreased strength, and pain.   ACTIVITY LIMITATIONS: carrying, sleeping, transfers, and locomotion level  PARTICIPATION LIMITATIONS: shopping and community activity  PERSONAL FACTORS: Age, Time since onset of injury/illness/exacerbation, and 1 comorbidity: PMH are also affecting patient's functional outcome.   REHAB POTENTIAL: Excellent  CLINICAL DECISION MAKING: Evolving/moderate complexity  EVALUATION COMPLEXITY: Moderate  PLAN:  PT FREQUENCY: 2x/week  PT DURATION: 4 weeks  PLANNED INTERVENTIONS: 97750- Physical Performance Testing, 97110-Therapeutic exercises, 97530- Therapeutic activity, 97112- Neuromuscular re-education, 97535- Self Care, 02859- Manual therapy, [phone]- Gait training, [phone]- Aquatic Therapy, and [phone]- Electrical stimulation (unattended)  PLAN FOR NEXT SESSION: progressions for LE muscular endurance and additional balance challenges to progress HEP (e.g. tandem stance) as she indicates vascular claudication/neuropathy   12:47 PM, 08/26/24 Takoda Siedlecki K Jonea Bukowski, PT, DPT Physical Therapist- [place] Office Number: [phone]

## 2024-08-28 ENCOUNTER — Encounter: Payer: Self-pay | Admitting: Podiatry

## 2024-08-28 NOTE — Progress Notes (Signed)
 Subjective:  Patient ID: Donna Baldwin, female    DOB: [birth year],  MRN: [id number]  HONORA SEARSON presents to clinic today for at risk foot care with history of diabetic neuropathy and painful thick toenails that are difficult to trim. Pain interferes with ambulation. Aggravating factors include wearing enclosed shoe gear. Pain is relieved with periodic professional debridement.  Chief Complaint  Patient presents with   RFC     RFC Non diabetic toenail trim. LOV with PCP 06/28/24.   New problem(s): None.   PCP is Teresa Channel, MD.  Allergies  Allergen Reactions   Beef Allergy  Anaphylaxis   Beef-Derived Drug Products Anaphylaxis   Codeine Anxiety and Hives   Iodine Anaphylaxis   Iodine-131 Anaphylaxis   Ivp Dye [Iodinated Contrast Media] Anaphylaxis and Other (See Comments)   Latex Rash   Morphine And Codeine Other (See Comments)    Tremors, increased heart rate, excitement, confusion per pt   Penicillins Hives    Has patient had a PCN reaction causing immediate rash, facial/tongue/throat swelling, SOB or lightheadedness with hypotension: Yes Has patient had a PCN reaction causing severe rash involving mucus membranes or skin necrosis: Yes Has patient had a PCN reaction that required hospitalization: Yes Has patient had a PCN reaction occurring within the last 10 years: No If all of the above answers are NO, then may proceed with Cephalosporin use.    Sertraline Other (See Comments)    Numbness in mouth and hyperactivity   Shellfish Allergy  Hives   Solu-Medrol  [Methylprednisolone ] Other (See Comments)    Numbness and tingling in mouth   Sulfa Antibiotics Hives and Rash   Sulfasalazine Hives and Rash   Barbiturates Other (See Comments)    Excitement    Bee Pollen Hives   Bee Venom Hives   Bioflavonoid Products     Other reaction(s): Other (See Comments) Nuts, strawberries, bicarbonate of soda, Nuts, strawberries, bicarbonate of soda,   Diovan  [Valsartan] Nausea Only   Gabapentin Other (See Comments)    Visual disturbance   Other Palpitations, Other (See Comments) and Rash    NARCOTIC ANALGESICS-TREMORS, INCREASED HEART RATE Hmg-Coa Reductase Inhibitors - intolerance unknown    Oxycodone Swelling, Rash and Cough   Pitavastatin  Other (See Comments) and Swelling    Facial swelling  Other Reaction(s): angioedema, angioedema, angioedema   Promethazine-Dm Other (See Comments)    Felt funny   Tape Rash and Other (See Comments)    Red blotches    Wellness Essentials Blood Sugr [Nutritional Supplements] Other (See Comments)    Nuts, strawberries, bicarbonate of soda,   Albuterol  Other (See Comments)    Caused wheezing   Ciprofloxacin  Other (See Comments)   Crestor  [Rosuvastatin ] Other (See Comments)   Diltiazem Hcl     Other reaction(s): foot sores   Ezetimibe      Other reaction(s): myalgias   Famotidine  Other (See Comments)    Unknown reaction   Oxycodone Hcl     Other reaction(s): hives, rash   Pred Forte [Prednisolone Acetate] Other (See Comments)    Patient is seeing odd color and images while using this   Welchol [Colesevelam]     Other reaction(s): myalgias   Celebrex [Celecoxib] Swelling and Other (See Comments)    Swelling and numbness (mouth)   Lanolin Itching   Mobic [Meloxicam] Swelling and Other (See Comments)    Swelling and numbness in mouth   Nickel Rash   Nitrates, Organic Rash and Other (See Comments)    Chest tightness also  Petrolatum Rash and Other (See Comments)   Soy Allergy  (Obsolete) Rash   Strawberry Extract Rash    Review of Systems: Negative except as noted in the HPI.  Objective: No changes noted in today's physical examination. There were no vitals filed for this visit. TAYLIN MANS is a pleasant 83 y.o. female in NAD. AAO x 3.  Vascular Examination: Vascular status intact b/l with palpable pedal pulses. CFT immediate b/l. Pedal hair present. No edema. No pain with calf  compression b/l. Skin temperature gradient WNL b/l. No varicosities noted. No cyanosis or clubbing noted.  Neurological Examination: Pt has subjective symptoms of neuropathy. Sensation grossly intact b/l with 10 gram monofilament. Vibratory sensation intact b/l.  Dermatological Examination: Pedal skin with normal turgor, texture and tone b/l. No open wounds nor interdigital macerations noted. Toenails 1-5 b/l thick, discolored, elongated with subungual debris and pain on dorsal palpation. No hyperkeratotic lesions noted b/l.   Musculoskeletal Examination: Muscle strength 5/5 to all lower extremity muscle groups bilaterally. HAV with bunion deformity noted b/l LE. Hammertoe deformity noted 2-5 b/l. Patient utilizes a cane for ambulation assistance.  Radiographs: None Assessment/Plan: 1. Pain due to onychomycosis of toenails of both feet   2. Idiopathic peripheral neuropathy   3. Controlled type 2 diabetes mellitus without complication, without long-term current use of insulin (HCC)   Patient was evaluated and treated. All patient's and/or POA's questions/concerns addressed on today's visit. Mycotic toenails 1-5 debrided in length and girth without incident.  Continue daily foot inspections and monitor blood glucose per PCP/Endocrinologist's recommendations.Continue soft, supportive shoe gear daily. Report any pedal injuries to medical professional. Call office if there are any quesitons/concerns. -Patient/POA to call should there be question/concern in the interim.   Return in about 3 months (around 11/25/2024).  Delon LITTIE Merlin, DPM      [facility] LOCATION: [address], [place] [address]                   Office [phone]   [facility] LOCATION: [address] [place], [place] [address] Office [phone]

## 2024-08-29 DIAGNOSIS — I253 Aneurysm of heart: Secondary | ICD-10-CM | POA: Diagnosis not present

## 2024-08-29 DIAGNOSIS — E1169 Type 2 diabetes mellitus with other specified complication: Secondary | ICD-10-CM | POA: Diagnosis not present

## 2024-08-29 DIAGNOSIS — M810 Age-related osteoporosis without current pathological fracture: Secondary | ICD-10-CM | POA: Diagnosis not present

## 2024-08-29 DIAGNOSIS — I10 Essential (primary) hypertension: Secondary | ICD-10-CM | POA: Diagnosis not present

## 2024-08-31 ENCOUNTER — Encounter: Payer: Self-pay | Admitting: Physical Therapy

## 2024-08-31 ENCOUNTER — Ambulatory Visit: Attending: Family Medicine | Admitting: Physical Therapy

## 2024-08-31 DIAGNOSIS — M6281 Muscle weakness (generalized): Secondary | ICD-10-CM | POA: Insufficient documentation

## 2024-08-31 DIAGNOSIS — R262 Difficulty in walking, not elsewhere classified: Secondary | ICD-10-CM | POA: Insufficient documentation

## 2024-08-31 DIAGNOSIS — R2681 Unsteadiness on feet: Secondary | ICD-10-CM | POA: Diagnosis not present

## 2024-08-31 NOTE — Therapy (Signed)
 OUTPATIENT PHYSICAL THERAPY NEURO TREATMENT   Patient Name: Donna Baldwin MRN: [id number] DOB:October 24, 1941, 83 y.o., female Today's Date: 08/31/2024   PCP: Teresa Channel, MD REFERRING PROVIDER: Teresa Channel, MD  END OF SESSION:  PT End of Session - 08/31/24 1146     Visit Number 5    Number of Visits 9    Date for PT Re-Evaluation 09/08/24    Authorization Type [employer]    PT Start Time 1146    PT Stop Time 1230    PT Time Calculation (min) 44 min    Activity Tolerance Patient tolerated treatment well           Past Medical History:  Diagnosis Date   Acid reflux    Anemia    Anxiety    Back pain    GERD (gastroesophageal reflux disease)    Hyperlipidemia    Hypertension    Hypertensive heart disease without heart failure    Thrombocytopenia (HCC)    Vitamin D  deficiency    Past Surgical History:  Procedure Laterality Date   ESOPHAGEAL MANOMETRY N/A 03/15/2020   Procedure: ESOPHAGEAL MANOMETRY (EM);  Surgeon: Dianna Specking, MD;  Location: [facility];  Service: Endoscopy;  Laterality: N/A;   EYE SURGERY     HELLER MYOTOMY N/A 04/18/2020   Procedure: LAPAROSCOPIC HELLER MYOTOMY WITH UPPER ENDOSCOPY;  Surgeon: Kinsinger, Herlene Righter, MD;  Location: [facility];  Service: General;  Laterality: N/A;   PH IMPEDANCE STUDY N/A 03/15/2020   Procedure: PH IMPEDANCE STUDY;  Surgeon: Dianna Specking, MD;  Location: [facility];  Service: Endoscopy;  Laterality: N/A;   SHOULDER SURGERY Bilateral    TONSILLECTOMY     Patient Active Problem List   Diagnosis Date Noted   Elevated d-dimer 01/22/2024   Diastolic dysfunction 01/22/2024   PVD (peripheral vascular disease) (HCC) 01/22/2024   Leukopenia 01/22/2024   Anxiety 01/22/2024   Weight loss 01/22/2024   Dysphagia 04/25/2023   Chest pain 08/06/2022   Bradycardia 08/05/2022   Circadian rhythm sleep disorder, advanced sleep phase type 08/07/2021   Bradycardia with 31-40 beats per minute 08/07/2021    Primary hypertension 08/07/2021   Snoring 08/07/2021   Excessive daytime sleepiness 08/07/2021   Palpitations 08/07/2021   Microcytic anemia 04/11/2021   Achalasia 04/18/2020   Abnormal CT of liver 12/20/2019   Coronary artery disease involving native coronary artery of native heart without angina pectoris 12/20/2019   Palpitation 02/08/2019   Interstitial cystitis 08/25/2018   Nonintractable headache    TIA (transient ischemic attack) 07/06/2018   History of diabetes mellitus 04/01/2017   Transient retinal artery occlusion of both eyes 07/07/2014   Hereditary and idiopathic peripheral neuropathy 07/07/2014   Aortic atherosclerosis (HCC) 12/03/2013   Mixed hyperlipidemia 12/03/2013   GERD (gastroesophageal reflux disease) 03/24/2013   Obstructive sleep apnea, ruled out 02/28/2013   Urticaria due to food allergy  08/31/2012   Thrombocytopenia (HCC)     ONSET DATE:   REFERRING DIAG: I73.9 (ICD-10-CM) - Peripheral vascular disease, unspecified  THERAPY DIAG:  Muscle weakness (generalized)  Difficulty in walking, not elsewhere classified  Unsteadiness on feet  Rationale for Evaluation and Treatment: Rehabilitation  SUBJECTIVE:  SUBJECTIVE STATEMENT: Pt states she does feel she is improving.   From eval: I Have neuropathy in my feet and PVD . Typically feels worse at night and the numbness is all day long and with activity have a feeling of cramping. Enjoys walking for exercise but this has become limiting and uses a bike peddler. Notes some issues with balance as she tends to shuffle and stumble. Would like to know what exercise can be done to help the pain in feet Pt accompanied by: significant other  PERTINENT HISTORY: PMH: HTN, HLD, anxiety, anemia, GERD, thrombocytopenia  PAIN:  Are  you having pain? No, just numbness  PRECAUTIONS: None  RED FLAGS: None   WEIGHT BEARING RESTRICTIONS: No  FALLS: Has patient fallen in last 6 months? No  LIVING ENVIRONMENT: Lives with: lives with their spouse Lives in: House/apartment Stairs: Yes: External: 4 steps; on right going up Has following equipment at home: Grab bars and None  PLOF: Independent  PATIENT GOALS: figure out what I can do for exercise.   OBJECTIVE:  TODAY'S TREATMENT: 08/31/24 Activity Comments  6 minute walking 717' with intermittent standing rest breaks, RPE 6/10  Seated Figure 4 stretch x30 Piriformis stretch x 30   Standing Hip abd yellow TB 2x10 Hip ext yellow TB 2x10 SLR yellow TB 2x10 Counter squat 2x10 Heel/toe raises 2x10 At counter   Standing on airex Feet apart EO x30 Feet apart EO head nods and head turns x 30 each Feet apart EC x30  Feet apart EC head nods and head turns x 30 each Marching 2x10 In // bars with sunglasses on Cues to keep weight in her heels            TODAY'S TREATMENT: 08/26/24 Activity Comments  NU-step level 4 x 8 min, seat position and arms at #9   HEP review 100% recall/return demonstration  Discussed progressive walking program per ACC/AHA and European guidelines walking to near-maximal claudication pain, rest, then repeat, for 30-60 minutes, 3x/week, for at least 12 weeks.  Rocker board balance x 60 sec -lateral -ant/post             PATIENT EDUCATION: Education details: HEP updates/modifications Person educated: Patient and Spouse Education method: Explanation and Handouts Education comprehension: verbalized understanding and needs further education  HOME EXERCISE PROGRAM: Access Code: [email or web address] URL: [email or web address] Date: 08/31/2024 Prepared by: Anja Neuzil April Earnie Starring  Exercises - Sit to Stand  - 1 x daily - 7 x weekly - 5 sets - 5 reps - Corner Balance Feet Together With Eyes Closed  - 1 x daily - 7 x weekly -  3 sets - 30 sec hold - Romberg Stance with Head Nods  - 1 x daily - 7 x weekly - 3 sets - 30 sec hold - Romberg Stance with Head Rotation  - 1 x daily - 7 x weekly - 3 sets - 30 sec hold - Standing Diagonal Chops with Medicine Ball  - 1 x daily - 7 x weekly - 2 sets - 10 reps - Heel Raises with Counter Support  - 2-3 x weekly - 3 sets - 10 reps - Mini Squat with Counter Support  - 2-3 x weekly - 3 sets - 10 reps - Standing Hip Abduction with Resistance at Ankles and Counter Support  - 1 x daily - 7 x weekly - 3 sets - 10 reps - Hip Extension with Resistance Loop  - 1 x daily - 7 x weekly - 3 sets -  10 reps - Seated Piriformis Stretch  - 1 x daily - 7 x weekly - 2 sets - 30 sec hold - Seated Piriformis Stretch  - 1 x daily - 7 x weekly - 2 sets - 30 sec hold   Note: Objective measures were completed at Evaluation unless otherwise noted.  DIAGNOSTIC FINDINGS:   COGNITION: Overall cognitive status: Within functional limits for tasks assessed   SENSATION: Notes numbness more along dorsum of feet Reports increased sensory disturbance at night COORDINATION: intact  EDEMA:  none  MUSCLE TONE: WNL  MUSCLE LENGTH: WNL  DTRs:  NT  POSTURE: No Significant postural limitations  LOWER EXTREMITY ROM:     WNL  LOWER EXTREMITY MMT:    RLE: 4/5 LLE: 3+/5  BED MOBILITY:  Not tested  TRANSFERS: Independent-modified indep--requires several attempts for sit to stand from chair height when not using UE  RAMP:  Not tested  CURB:  Findings: SBA-CGA  STAIRS: Not tested GAIT: Findings: Comments: decreased speed and stride length  FUNCTIONAL TESTS:  5 times sit to stand: 30 sec 6 minute walk test: TBD 10 meter walk test: 17 sec = 1.9 ft/sec Berg Balance Test: 44/56   M-CTSIB  Condition 1: Firm Surface, EO 30 Sec, Mild and Moderate Sway  Condition 2: Firm Surface, EC 30 Sec, Moderate and Severe Sway  Condition 3: Foam Surface, EO  Sec,  Sway  Condition 4: Foam Surface,  EC  Sec,  Sway                                                                                                                                          GOALS: Goals reviewed with patient? Yes  SHORT TERM GOALS: Target date: 08/25/2024    Patient will be independent in HEP to improve functional outcomes Baseline: Goal status: MET    LONG TERM GOALS: Target date: 09/08/2024    Demo improved BLE strength and reduced risk for falls per time 20 sec 5xSTS test Baseline: 30 sec, requires multiple attempts for STS Goal status: INITIAL  2.  Demo improved balance and reduce risk for falls per score 48/56 Berg Balance Test Baseline: 44/56 Goal status: INITIAL  3.  to increase to at least 1000' to demo MCID Baseline:  Goal status: INITIAL  4.  Demo improved postural stability per mild sway x 30 sec condition 2 M-CTSIB for improved safety during ADL Baseline: 30 sec mod-severe Goal status: INITIAL    ASSESSMENT:  CLINICAL IMPRESSION: Continued to work on improving pt's endurance and strength. Able to perform 6 min of walking today with less exertion than last check. Able to progress pt's strengthening to using yellow TB for resistance. She overall remains compliant to her HEP.   From eval: Patient is a 83 y.o. lady who was seen today for physical therapy evaluation and treatment for Peripheral vascular disease, unspecified.  Pt reports  limited activity tolerance/endurance with report of vascular claudication that limits degree of ambulation and increased pain response in the evening. Exhibits generalized weakness BLE LLE>RLE which creates difficulty with transfers from lower seat heights.  Balance deficits w/ high risk for falls per outcome measures and decreased postural stability evident by +Romberg test.  Pt would benefit from PT services to provide with relevant intervention, compensation, and adaptive strategies to improve mobility, activity tolerance, and reduce risk  fro falls   OBJECTIVE IMPAIRMENTS: Abnormal gait, decreased activity tolerance, decreased balance, decreased endurance, decreased knowledge of use of DME, decreased mobility, difficulty walking, decreased strength, and pain.   ACTIVITY LIMITATIONS: carrying, sleeping, transfers, and locomotion level  PARTICIPATION LIMITATIONS: shopping and community activity  PERSONAL FACTORS: Age, Time since onset of injury/illness/exacerbation, and 1 comorbidity: PMH are also affecting patient's functional outcome.   REHAB POTENTIAL: Excellent  CLINICAL DECISION MAKING: Evolving/moderate complexity  EVALUATION COMPLEXITY: Moderate  PLAN:  PT FREQUENCY: 2x/week  PT DURATION: 4 weeks  PLANNED INTERVENTIONS: 97750- Physical Performance Testing, 97110-Therapeutic exercises, 97530- Therapeutic activity, [id number]- Neuromuscular re-education, 97535- Self Care, [id number]- Manual therapy, [phone]- Gait training, [phone]- Aquatic Therapy, and [phone]- Electrical stimulation (unattended)  PLAN FOR NEXT SESSION: progressions for LE muscular endurance and additional balance challenges to progress HEP (e.g. tandem stance) as she indicates vascular claudication/neuropathy   11:46 AM, 08/31/24 Anja Neuzil April Ma L Tucker Minter, PT, DPT Physical Therapist- [facility] Office Number: [phone]

## 2024-09-02 ENCOUNTER — Ambulatory Visit: Admitting: Physical Therapy

## 2024-09-02 ENCOUNTER — Encounter: Payer: Self-pay | Admitting: Physical Therapy

## 2024-09-02 DIAGNOSIS — M6281 Muscle weakness (generalized): Secondary | ICD-10-CM

## 2024-09-02 DIAGNOSIS — R262 Difficulty in walking, not elsewhere classified: Secondary | ICD-10-CM

## 2024-09-02 DIAGNOSIS — R2681 Unsteadiness on feet: Secondary | ICD-10-CM

## 2024-09-02 NOTE — Therapy (Signed)
 OUTPATIENT PHYSICAL THERAPY NEURO TREATMENT   Patient Name: Donna Baldwin MRN: [id number] DOB:26-Dec-1941, 83 y.o., female Today's Date: 09/02/2024   PCP: Teresa Channel, MD REFERRING PROVIDER: Teresa Channel, MD  END OF SESSION:  PT End of Session - 09/02/24 1150     Visit Number 6    Number of Visits 9    Date for PT Re-Evaluation 09/08/24    Authorization Type [employer]    PT Start Time 1150    PT Stop Time 1230    PT Time Calculation (min) 40 min    Activity Tolerance Patient tolerated treatment well            Past Medical History:  Diagnosis Date   Acid reflux    Anemia    Anxiety    Back pain    GERD (gastroesophageal reflux disease)    Hyperlipidemia    Hypertension    Hypertensive heart disease without heart failure    Thrombocytopenia (HCC)    Vitamin D  deficiency    Past Surgical History:  Procedure Laterality Date   ESOPHAGEAL MANOMETRY N/A 03/15/2020   Procedure: ESOPHAGEAL MANOMETRY (EM);  Surgeon: Dianna Specking, MD;  Location: [facility];  Service: Endoscopy;  Laterality: N/A;   EYE SURGERY     HELLER MYOTOMY N/A 04/18/2020   Procedure: LAPAROSCOPIC HELLER MYOTOMY WITH UPPER ENDOSCOPY;  Surgeon: Kinsinger, Herlene Righter, MD;  Location: [facility];  Service: General;  Laterality: N/A;   PH IMPEDANCE STUDY N/A 03/15/2020   Procedure: PH IMPEDANCE STUDY;  Surgeon: Dianna Specking, MD;  Location: [facility];  Service: Endoscopy;  Laterality: N/A;   SHOULDER SURGERY Bilateral    TONSILLECTOMY     Patient Active Problem List   Diagnosis Date Noted   Elevated d-dimer 01/22/2024   Diastolic dysfunction 01/22/2024   PVD (peripheral vascular disease) (HCC) 01/22/2024   Leukopenia 01/22/2024   Anxiety 01/22/2024   Weight loss 01/22/2024   Dysphagia 04/25/2023   Chest pain 08/06/2022   Bradycardia 08/05/2022   Circadian rhythm sleep disorder, advanced sleep phase type 08/07/2021   Bradycardia with 31-40 beats per minute 08/07/2021    Primary hypertension 08/07/2021   Snoring 08/07/2021   Excessive daytime sleepiness 08/07/2021   Palpitations 08/07/2021   Microcytic anemia 04/11/2021   Achalasia 04/18/2020   Abnormal CT of liver 12/20/2019   Coronary artery disease involving native coronary artery of native heart without angina pectoris 12/20/2019   Palpitation 02/08/2019   Interstitial cystitis 08/25/2018   Nonintractable headache    TIA (transient ischemic attack) 07/06/2018   History of diabetes mellitus 04/01/2017   Transient retinal artery occlusion of both eyes 07/07/2014   Hereditary and idiopathic peripheral neuropathy 07/07/2014   Aortic atherosclerosis (HCC) 12/03/2013   Mixed hyperlipidemia 12/03/2013   GERD (gastroesophageal reflux disease) 03/24/2013   Obstructive sleep apnea, ruled out 02/28/2013   Urticaria due to food allergy  08/31/2012   Thrombocytopenia (HCC)     ONSET DATE:   REFERRING DIAG: I73.9 (ICD-10-CM) - Peripheral vascular disease, unspecified  THERAPY DIAG:  Muscle weakness (generalized)  Difficulty in walking, not elsewhere classified  Unsteadiness on feet  Rationale for Evaluation and Treatment: Rehabilitation  SUBJECTIVE:  SUBJECTIVE STATEMENT: Pt states she felt good after last session.   From eval: I Have neuropathy in my feet and PVD . Typically feels worse at night and the numbness is all day long and with activity have a feeling of cramping. Enjoys walking for exercise but this has become limiting and uses a bike peddler. Notes some issues with balance as she tends to shuffle and stumble. Would like to know what exercise can be done to help the pain in feet Pt accompanied by: significant other  PERTINENT HISTORY: PMH: HTN, HLD, anxiety, anemia, GERD, thrombocytopenia  PAIN:   Are you having pain? No, just numbness  PRECAUTIONS: None  RED FLAGS: None   WEIGHT BEARING RESTRICTIONS: No  FALLS: Has patient fallen in last 6 months? No  LIVING ENVIRONMENT: Lives with: lives with their spouse Lives in: House/apartment Stairs: Yes: External: 4 steps; on right going up Has following equipment at home: Grab bars and None  PLOF: Independent  PATIENT GOALS: figure out what I can do for exercise.   OBJECTIVE:  TODAY'S TREATMENT: 09/02/24 Activity Comments  Sci fit 3 min fwd and then 3 min bwd Increased aching in R knee Mild SHOB at the end  Seated Hamstring stretch x 30 Figure 4 stretch x30 Piriformis stretch x 30   Self care HEP review, d/c planning, how to use medbridge app/website, features, etc  Sit to stand 4x5 no UE support                 TODAY'S TREATMENT: 08/31/24 Activity Comments  6 minute walking 717' with intermittent standing rest breaks, RPE 6/10  Seated Figure 4 stretch x30 Piriformis stretch x 30   Standing Hip abd yellow TB 2x10 Hip ext yellow TB 2x10 SLR yellow TB 2x10 Counter squat 2x10 Heel/toe raises 2x10 At counter   Standing on airex Feet apart EO x30 Feet apart EO head nods and head turns x 30 each Feet apart EC x30  Feet apart EC head nods and head turns x 30 each Marching 2x10 In // bars with sunglasses on Cues to keep weight in her heels                  PATIENT EDUCATION: Education details: HEP updates/modifications Person educated: Patient and Spouse Education method: Explanation and Handouts Education comprehension: verbalized understanding and needs further education  HOME EXERCISE PROGRAM: Access Code: [email or web address] URL: [email or web address] Date: 08/31/2024 Prepared by: Beckham Buxbaum April Earnie Starring  Exercises - Sit to Stand  - 1 x daily - 7 x weekly - 5 sets - 5 reps - Corner Balance Feet Together With Eyes Closed  - 1 x daily - 7 x weekly - 3 sets - 30 sec hold - Romberg  Stance with Head Nods  - 1 x daily - 7 x weekly - 3 sets - 30 sec hold - Romberg Stance with Head Rotation  - 1 x daily - 7 x weekly - 3 sets - 30 sec hold - Standing Diagonal Chops with Medicine Ball  - 1 x daily - 7 x weekly - 2 sets - 10 reps - Heel Raises with Counter Support  - 2-3 x weekly - 3 sets - 10 reps - Mini Squat with Counter Support  - 2-3 x weekly - 3 sets - 10 reps - Standing Hip Abduction with Resistance at Ankles and Counter Support  - 1 x daily - 7 x weekly - 3 sets - 10 reps - Hip Extension with Resistance Loop  -  1 x daily - 7 x weekly - 3 sets - 10 reps - Seated Piriformis Stretch  - 1 x daily - 7 x weekly - 2 sets - 30 sec hold - Seated Piriformis Stretch  - 1 x daily - 7 x weekly - 2 sets - 30 sec hold   Note: Objective measures were completed at Evaluation unless otherwise noted.  DIAGNOSTIC FINDINGS:   COGNITION: Overall cognitive status: Within functional limits for tasks assessed   SENSATION: Notes numbness more along dorsum of feet Reports increased sensory disturbance at night COORDINATION: intact  EDEMA:  none  MUSCLE TONE: WNL  MUSCLE LENGTH: WNL  DTRs:  NT  POSTURE: No Significant postural limitations  LOWER EXTREMITY ROM:     WNL  LOWER EXTREMITY MMT:    RLE: 4/5 LLE: 3+/5  BED MOBILITY:  Not tested  TRANSFERS: Independent-modified indep--requires several attempts for sit to stand from chair height when not using UE  RAMP:  Not tested  CURB:  Findings: SBA-CGA  STAIRS: Not tested GAIT: Findings: Comments: decreased speed and stride length  FUNCTIONAL TESTS:  5 times sit to stand: 30 sec 6 minute walk test: TBD 10 meter walk test: 17 sec = 1.9 ft/sec Berg Balance Test: 44/56   M-CTSIB  Condition 1: Firm Surface, EO 30 Sec, Mild and Moderate Sway  Condition 2: Firm Surface, EC 30 Sec, Moderate and Severe Sway  Condition 3: Foam Surface, EO  Sec,  Sway  Condition 4: Foam Surface, EC  Sec,  Sway                                                                                                                                           GOALS: Goals reviewed with patient? Yes  SHORT TERM GOALS: Target date: 08/25/2024    Patient will be independent in HEP to improve functional outcomes Baseline: Goal status: MET    LONG TERM GOALS: Target date: 09/08/2024    Demo improved BLE strength and reduced risk for falls per time 20 sec 5xSTS test Baseline: 30 sec, requires multiple attempts for STS Goal status: INITIAL  2.  Demo improved balance and reduce risk for falls per score 48/56 Berg Balance Test Baseline: 44/56 Goal status: INITIAL  3.  to increase to at least 1000' to demo MCID Baseline:  Goal status: INITIAL  4.  Demo improved postural stability per mild sway x 30 sec condition 2 M-CTSIB for improved safety during ADL Baseline: 30 sec mod-severe Goal status: INITIAL    ASSESSMENT:  CLINICAL IMPRESSION: Initiated session by working on [employer] endurance. Able to tolerate spurts of 3 min intervals on bike today with pt reporting muscle soreness and fatigue as a limiting factor. Reviewed HEP and how to use medbridge app for pt care at home. Discussed how to perform interval training at home and continuing exercises/activities for planning upon d/c.  From eval: Patient is a 83 y.o. lady who was seen today for physical therapy evaluation and treatment for Peripheral vascular disease, unspecified.  Pt reports limited activity tolerance/endurance with report of vascular claudication that limits degree of ambulation and increased pain response in the evening. Exhibits generalized weakness BLE LLE>RLE which creates difficulty with transfers from lower seat heights.  Balance deficits w/ high risk for falls per outcome measures and decreased postural stability evident by +Romberg test.  Pt would benefit from PT services to provide with relevant intervention, compensation, and adaptive  strategies to improve mobility, activity tolerance, and reduce risk fro falls   OBJECTIVE IMPAIRMENTS: Abnormal gait, decreased activity tolerance, decreased balance, decreased endurance, decreased knowledge of use of DME, decreased mobility, difficulty walking, decreased strength, and pain.   ACTIVITY LIMITATIONS: carrying, sleeping, transfers, and locomotion level  PARTICIPATION LIMITATIONS: shopping and community activity  PERSONAL FACTORS: Age, Time since onset of injury/illness/exacerbation, and 1 comorbidity: PMH are also affecting patient's functional outcome.   REHAB POTENTIAL: Excellent  CLINICAL DECISION MAKING: Evolving/moderate complexity  EVALUATION COMPLEXITY: Moderate  PLAN:  PT FREQUENCY: 2x/week  PT DURATION: 4 weeks  PLANNED INTERVENTIONS: 97750- Physical Performance Testing, 97110-Therapeutic exercises, 97530- Therapeutic activity, 97112- Neuromuscular re-education, 97535- Self Care, 02859- Manual therapy, [phone]- Gait training, [phone]- Aquatic Therapy, and [phone]- Electrical stimulation (unattended)  PLAN FOR NEXT SESSION: progressions for LE muscular endurance/strengthening and additional balance challenges to progress HEP (e.g. tandem stance) as she indicates vascular claudication/neuropathy   11:57 AM, 09/02/24 Ibrahem Volkman April Ma L Elasia Furnish, PT, DPT Physical Therapist- [place] Office Number: [phone]

## 2024-09-03 NOTE — Therapy (Signed)
 OUTPATIENT PHYSICAL THERAPY NEURO PROGRESS NOTE/RE-CERT   Patient Name: Donna Baldwin MRN: [id number] DOB:1941-04-29, 83 y.o., female Today's Date: 09/06/2024   PCP: Teresa Channel, MD REFERRING PROVIDER: Teresa Channel, MD  END OF SESSION:  PT End of Session - 09/06/24 1237     Visit Number 7    Number of Visits 12    Date for PT Re-Evaluation 10/15/24    Authorization Type [employer]    Progress Note Due on Visit 17    PT Start Time 1148    PT Stop Time 1230    PT Time Calculation (min) 42 min    Equipment Utilized During Treatment Gait belt    Activity Tolerance Patient tolerated treatment well    Behavior During Therapy WFL for tasks assessed/performed             Past Medical History:  Diagnosis Date   Acid reflux    Anemia    Anxiety    Back pain    GERD (gastroesophageal reflux disease)    Hyperlipidemia    Hypertension    Hypertensive heart disease without heart failure    Thrombocytopenia (HCC)    Vitamin D  deficiency    Past Surgical History:  Procedure Laterality Date   ESOPHAGEAL MANOMETRY N/A 03/15/2020   Procedure: ESOPHAGEAL MANOMETRY (EM);  Surgeon: Dianna Specking, MD;  Location: [facility];  Service: Endoscopy;  Laterality: N/A;   EYE SURGERY     HELLER MYOTOMY N/A 04/18/2020   Procedure: LAPAROSCOPIC HELLER MYOTOMY WITH UPPER ENDOSCOPY;  Surgeon: Kinsinger, Herlene Righter, MD;  Location: [facility];  Service: General;  Laterality: N/A;   PH IMPEDANCE STUDY N/A 03/15/2020   Procedure: PH IMPEDANCE STUDY;  Surgeon: Dianna Specking, MD;  Location: [facility];  Service: Endoscopy;  Laterality: N/A;   SHOULDER SURGERY Bilateral    TONSILLECTOMY     Patient Active Problem List   Diagnosis Date Noted   Elevated d-dimer 01/22/2024   Diastolic dysfunction 01/22/2024   PVD (peripheral vascular disease) (HCC) 01/22/2024   Leukopenia 01/22/2024   Anxiety 01/22/2024   Weight loss 01/22/2024   Dysphagia 04/25/2023   Chest pain  08/06/2022   Bradycardia 08/05/2022   Circadian rhythm sleep disorder, advanced sleep phase type 08/07/2021   Bradycardia with 31-40 beats per minute 08/07/2021   Primary hypertension 08/07/2021   Snoring 08/07/2021   Excessive daytime sleepiness 08/07/2021   Palpitations 08/07/2021   Microcytic anemia 04/11/2021   Achalasia 04/18/2020   Abnormal CT of liver 12/20/2019   Coronary artery disease involving native coronary artery of native heart without angina pectoris 12/20/2019   Palpitation 02/08/2019   Interstitial cystitis 08/25/2018   Nonintractable headache    TIA (transient ischemic attack) 07/06/2018   History of diabetes mellitus 04/01/2017   Transient retinal artery occlusion of both eyes 07/07/2014   Hereditary and idiopathic peripheral neuropathy 07/07/2014   Aortic atherosclerosis (HCC) 12/03/2013   Mixed hyperlipidemia 12/03/2013   GERD (gastroesophageal reflux disease) 03/24/2013   Obstructive sleep apnea, ruled out 02/28/2013   Urticaria due to food allergy  08/31/2012   Thrombocytopenia (HCC)     ONSET DATE:   REFERRING DIAG: I73.9 (ICD-10-CM) - Peripheral vascular disease, unspecified  THERAPY DIAG:  Muscle weakness (generalized)  Difficulty in walking, not elsewhere classified  Unsteadiness on feet  Rationale for Evaluation and Treatment: Rehabilitation  SUBJECTIVE:  SUBJECTIVE STATEMENT: April gave me the exercises on the app. Was able to perform them over the weekend.   Pt accompanied by: significant other in waiting   PERTINENT HISTORY: PMH: HTN, HLD, anxiety, anemia, GERD, thrombocytopenia  PAIN:  Are you having pain? No, reports occasionally in R LB  PRECAUTIONS: None  RED FLAGS: None   WEIGHT BEARING RESTRICTIONS: No  FALLS: Has patient fallen in last 6  months? No  LIVING ENVIRONMENT: Lives with: lives with their spouse Lives in: House/apartment Stairs: Yes: External: 4 steps; on right going up Has following equipment at home: Grab bars and None  PLOF: Independent  PATIENT GOALS: figure out what I can do for exercise.   OBJECTIVE:     TODAY'S TREATMENT: 09/06/24 Activity Comments  5xSTS 15.92 sec with B armrests   Berg 45/56   MCTSIB #2 30 sec with mild sway              [facility] PT Assessment - 09/06/24 0001       Berg Balance Test   Sit to Stand Able to stand  independently using hands    Standing Unsupported Able to stand safely 2 minutes    Sitting with Back Unsupported but Feet Supported on Floor or Stool Able to sit safely and securely 2 minutes    Stand to Sit Sits safely with minimal use of hands    Transfers Able to transfer safely, minor use of hands    Standing Unsupported with Eyes Closed Able to stand 10 seconds safely    Standing Unsupported with Feet Together Able to place feet together independently and stand 1 minute safely    From Standing, Reach Forward with Outstretched Arm Can reach confidently >25 cm (10)    From Standing Position, Pick up Object from Floor Able to pick up shoe safely and easily    From Standing Position, Turn to Look Behind Over each Shoulder Needs supervision when turning   near-fall when turning L   Turn 360 Degrees Needs close supervision or verbal cueing   c/o dizziness and required CGA-minA   Standing Unsupported, Alternately Place Feet on Step/Stool Able to stand independently and safely and complete 8 steps in 20 seconds    Standing Unsupported, One Foot in Front Needs help to step but can hold 15 seconds    Standing on One Leg Able to lift leg independently and hold 5-10 seconds    Total Score 45            PATIENT EDUCATION: Education details: answered pt's questions on exercise after therapy and on exercise benefits for PVD, discussed progress towards goals and  remaining impairments, POC and schedule Person educated: Patient Education method: Explanation, Demonstration, Tactile cues, and Verbal cues Education comprehension: verbalized understanding     HOME EXERCISE PROGRAM: Access Code: [email or web address] URL: [email or web address] Date: 08/31/2024 Prepared by: Gellen April Earnie Starring  Exercises - Sit to Stand  - 1 x daily - 7 x weekly - 5 sets - 5 reps - Corner Balance Feet Together With Eyes Closed  - 1 x daily - 7 x weekly - 3 sets - 30 sec hold - Romberg Stance with Head Nods  - 1 x daily - 7 x weekly - 3 sets - 30 sec hold - Romberg Stance with Head Rotation  - 1 x daily - 7 x weekly - 3 sets - 30 sec hold - Standing Diagonal Chops with Medicine Ball  - 1 x daily - 7 x weekly -  2 sets - 10 reps - Heel Raises with Counter Support  - 2-3 x weekly - 3 sets - 10 reps - Mini Squat with Counter Support  - 2-3 x weekly - 3 sets - 10 reps - Standing Hip Abduction with Resistance at Ankles and Counter Support  - 1 x daily - 7 x weekly - 3 sets - 10 reps - Hip Extension with Resistance Loop  - 1 x daily - 7 x weekly - 3 sets - 10 reps - Seated Piriformis Stretch  - 1 x daily - 7 x weekly - 2 sets - 30 sec hold - Seated Piriformis Stretch  - 1 x daily - 7 x weekly - 2 sets - 30 sec hold   Note: Objective measures were completed at Evaluation unless otherwise noted.  DIAGNOSTIC FINDINGS:   COGNITION: Overall cognitive status: Within functional limits for tasks assessed   SENSATION: Notes numbness more along dorsum of feet Reports increased sensory disturbance at night COORDINATION: intact  EDEMA:  none  MUSCLE TONE: WNL  MUSCLE LENGTH: WNL  DTRs:  NT  POSTURE: No Significant postural limitations  LOWER EXTREMITY ROM:     WNL  LOWER EXTREMITY MMT:    RLE: 4/5 LLE: 3+/5  BED MOBILITY:  Not tested  TRANSFERS: Independent-modified indep--requires several attempts for sit to stand from chair height when not using  UE  RAMP:  Not tested  CURB:  Findings: SBA-CGA  STAIRS: Not tested GAIT: Findings: Comments: decreased speed and stride length  FUNCTIONAL TESTS:  5 times sit to stand: 30 sec 6 minute walk test: TBD 10 meter walk test: 17 sec = 1.9 ft/sec Berg Balance Test: 44/56   M-CTSIB  Condition 1: Firm Surface, EO 30 Sec, Mild and Moderate Sway  Condition 2: Firm Surface, EC 30 Sec, Moderate and Severe Sway  Condition 3: Foam Surface, EO  Sec,  Sway  Condition 4: Foam Surface, EC  Sec,  Sway                                                                                                                                          GOALS: Goals reviewed with patient? Yes  SHORT TERM GOALS: Target date: 08/25/2024    Patient will be independent in HEP to improve functional outcomes Baseline: Goal status: MET    LONG TERM GOALS: Target date: 10/15/2024    Demo improved BLE strength and reduced risk for falls per time 20 sec 5xSTS test Baseline: 30 sec, requires multiple attempts for STS; 15 sec with B armrests 09/06/24 Goal status: MET 09/06/24  2.  Demo improved balance and reduce risk for falls per score 48/56 Berg Balance Test Baseline: 44/56; 45/56 09/06/24 Goal status: IN PROGRESS  3.  to increase to at least 1000' to demo MCID Baseline:  08/17/24 At 3 min rated 5/10 RPE After 6 min rated 8/10 RPE,  107/81, 96 BPM, 94% spO2 779 feet  08/31/24: 717' with intermittent standing rest breaks, RPE 6/10 Goal status: IN PROGRESS 08/31/24  4.  Demo improved postural stability per mild sway x 30 sec condition 2 M-CTSIB for improved safety during ADL Baseline: 30 sec mod-severe; 30 sec with mild sway 09/06/24 Goal status: MET 09/06/24    ASSESSMENT:  CLINICAL IMPRESSION: Patient arrived to session without new complaints. Pt is reporting benefit from PT and plans for continuing  exercise- [employer]akers or [employer] membership was recommended. Patient's transfer speed has  improved as evidenced by 5xSTS and this goal has been met. Patient scored 45/56 on Berg, indicating a risk of falls but slightly improved from initial assessment. Remaining imbalance evident with turns and narrow BOS. was reassessed last session and this goal is still ongoing. Patient is demonstrating progress towards goals; would benefit from additional skilled PT services 1x/week for 4 weeks after finishing out this week of visits to address remaining goals.   OBJECTIVE IMPAIRMENTS: Abnormal gait, decreased activity tolerance, decreased balance, decreased endurance, decreased knowledge of use of DME, decreased mobility, difficulty walking, decreased strength, and pain.   ACTIVITY LIMITATIONS: carrying, sleeping, transfers, and locomotion level  PARTICIPATION LIMITATIONS: shopping and community activity  PERSONAL FACTORS: Age, Time since onset of injury/illness/exacerbation, and 1 comorbidity: PMH are also affecting patient's functional outcome.   REHAB POTENTIAL: Excellent  CLINICAL DECISION MAKING: Evolving/moderate complexity  EVALUATION COMPLEXITY: Moderate  PLAN:  PT FREQUENCY: 2x/week  PT DURATION: 4 weeks  PLANNED INTERVENTIONS: 97750- Physical Performance Testing, 97110-Therapeutic exercises, 97530- Therapeutic activity, 97112- Neuromuscular re-education, 97535- Self Care, 02859- Manual therapy, [phone]- Gait training, [phone]- Aquatic Therapy, and [phone]- Electrical stimulation (unattended)  PLAN FOR NEXT SESSION: balance with turns and narrow BOS; progressions for LE muscular endurance/strengthening and additional balance challenges to progress HEP (e.g. tandem stance) as she indicates vascular claudication/neuropathy   Louana Terrilyn Christians, PT, DPT 09/06/24 12:45 PM  [facility] [address], [address] [place], [place] [address] Phone # [phone] Fax # [phone]

## 2024-09-06 ENCOUNTER — Ambulatory Visit: Admitting: Physical Therapy

## 2024-09-06 ENCOUNTER — Encounter: Payer: Self-pay | Admitting: Physical Therapy

## 2024-09-06 DIAGNOSIS — M6281 Muscle weakness (generalized): Secondary | ICD-10-CM

## 2024-09-06 DIAGNOSIS — R262 Difficulty in walking, not elsewhere classified: Secondary | ICD-10-CM

## 2024-09-06 DIAGNOSIS — R2681 Unsteadiness on feet: Secondary | ICD-10-CM

## 2024-09-07 ENCOUNTER — Inpatient Hospital Stay: Attending: Internal Medicine

## 2024-09-07 ENCOUNTER — Inpatient Hospital Stay (HOSPITAL_BASED_OUTPATIENT_CLINIC_OR_DEPARTMENT_OTHER): Admitting: Internal Medicine

## 2024-09-07 ENCOUNTER — Telehealth: Payer: Self-pay

## 2024-09-07 VITALS — BP 161/62 | HR 59 | Temp 98.0°F | Resp 17 | Wt 103.7 lb

## 2024-09-07 DIAGNOSIS — D509 Iron deficiency anemia, unspecified: Secondary | ICD-10-CM | POA: Diagnosis not present

## 2024-09-07 DIAGNOSIS — D696 Thrombocytopenia, unspecified: Secondary | ICD-10-CM | POA: Insufficient documentation

## 2024-09-07 DIAGNOSIS — D539 Nutritional anemia, unspecified: Secondary | ICD-10-CM

## 2024-09-07 LAB — CMP (CANCER CENTER ONLY)
ALT: 12 U/L (ref 0–44)
AST: 23 U/L (ref 15–41)
Albumin: 4.4 g/dL (ref 3.5–5.0)
Alkaline Phosphatase: 113 U/L (ref 38–126)
Anion gap: 5 (ref 5–15)
BUN: 13 mg/dL (ref 8–23)
CO2: 31 mmol/L (ref 22–32)
Calcium: 9.4 mg/dL (ref 8.9–10.3)
Chloride: 107 mmol/L (ref 98–111)
Creatinine: 0.82 mg/dL (ref 0.44–1.00)
GFR, Estimated: >60 mL/min (ref >60)
Glucose, Bld: 77 mg/dL (ref 70–99)
Potassium: 4.3 mmol/L (ref 3.5–5.1)
Sodium: 143 mmol/L (ref 135–145)
Total Bilirubin: 0.5 mg/dL (ref 0.0–1.2)
Total Protein: 7.2 g/dL (ref 6.5–8.1)

## 2024-09-07 LAB — IRON AND IRON BINDING CAPACITY (CC-WL,HP ONLY)
Iron: 93 ug/dL (ref 28–170)
Saturation Ratios: 27 % (ref 10.4–31.8)
TIBC: 342 ug/dL (ref 250–450)
UIBC: 249 ug/dL (ref 148–442)

## 2024-09-07 LAB — CBC WITH DIFFERENTIAL (CANCER CENTER ONLY)
Abs Immature Granulocytes: 0 K/uL (ref 0.00–0.07)
Basophils Absolute: 0 K/uL (ref 0.0–0.1)
Basophils Relative: 1 %
Eosinophils Absolute: 0 K/uL (ref 0.0–0.5)
Eosinophils Relative: 1 %
HCT: 37.3 % (ref 36.0–46.0)
Hemoglobin: 11.5 g/dL — ABNORMAL LOW (ref 12.0–15.0)
Immature Granulocytes: 0 %
Lymphocytes Relative: 41 %
Lymphs Abs: 1.3 K/uL (ref 0.7–4.0)
MCH: 22.6 pg — ABNORMAL LOW (ref 26.0–34.0)
MCHC: 30.8 g/dL (ref 30.0–36.0)
MCV: 73.4 fL — ABNORMAL LOW (ref 80.0–100.0)
Monocytes Absolute: 0.3 K/uL (ref 0.1–1.0)
Monocytes Relative: 11 %
Neutro Abs: 1.5 K/uL — ABNORMAL LOW (ref 1.7–7.7)
Neutrophils Relative %: 46 %
Platelet Count: 97 K/uL — ABNORMAL LOW (ref 150–400)
RBC: 5.08 MIL/uL (ref 3.87–5.11)
RDW: 15.9 % — ABNORMAL HIGH (ref 11.5–15.5)
Smear Review: NORMAL
WBC Count: 3.2 K/uL — ABNORMAL LOW (ref 4.0–10.5)
nRBC: 0 % (ref 0.0–0.2)

## 2024-09-07 LAB — VITAMIN B12: Vitamin B-12: 894 pg/mL (ref 180–914)

## 2024-09-07 LAB — FERRITIN: Ferritin: 112 ng/mL (ref 11–307)

## 2024-09-07 LAB — FOLATE: Folate: 5.1 ng/mL — ABNORMAL LOW (ref >5.9)

## 2024-09-07 NOTE — Telephone Encounter (Signed)
 Spoke with patient regarding lab results. Per Dr. Sherrod, patient's folate is low, and she will benefit from taking an over-the-counter folic acid  supplement once daily. Patient inquired about folate, and I informed her that it is a vitamin. She voiced understanding.

## 2024-09-07 NOTE — Progress Notes (Signed)
 [facility] Telephone:([phone]) [phone]   Fax:([phone]) [phone]  OFFICE PROGRESS NOTE  Teresa Channel, MD [phone] [address] [place] [address]  DIAGNOSIS:  1) Thrombocytopenia likely ITP. 2) microcytic anemia of unclear etiology.   PRIOR THERAPY: None  CURRENT THERAPY: Multivitamins with vitamin B12 supplements.  INTERVAL HISTORY: Donna Baldwin 83 y.o. female returns to the clinic today for follow-up visit accompanied by her daughter.Discussed the use of AI scribe software for clinical note transcription with the patient, who gave verbal consent to proceed.  History of Present Illness Donna Baldwin is an 83 year old female who presents for evaluation and repeat blood work. She is accompanied by her husband.  She feels very cold, tired, and experiences chills. She also has a headache but has not taken any medication for it, stating she only took her high blood pressure medication.  Her current medications include multivitamins with vitamin B12 supplements. She has not undergone a bone marrow biopsy previously.  Her blood work shows a white blood count of 3.2, hemoglobin of 11.5, and platelets at 97. Her iron studies are normal.  No additional symptoms were mentioned.      MEDICAL HISTORY: Past Medical History:  Diagnosis Date   Acid reflux    Anemia    Anxiety    Back pain    GERD (gastroesophageal reflux disease)    Hyperlipidemia    Hypertension    Hypertensive heart disease without heart failure    Thrombocytopenia (HCC)    Vitamin D  deficiency     ALLERGIES:  is allergic to beef allergy ; beef-derived drug products; codeine; iodine; iodine-131; ivp dye [iodinated contrast media]; latex; morphine and codeine; penicillins; sertraline; shellfish allergy ; solu-medrol  [methylprednisolone ]; sulfa antibiotics; sulfasalazine; barbiturates; bee pollen; bee venom; bioflavonoid products; diovan [valsartan]; gabapentin; other; oxycodone;  pitavastatin ; promethazine-dm; tape; wellness essentials blood sugr [nutritional supplements]; albuterol ; ciprofloxacin ; crestor  [rosuvastatin ]; diltiazem hcl; ezetimibe ; famotidine ; oxycodone hcl; pred forte [prednisolone acetate]; welchol [colesevelam]; celebrex [celecoxib]; lanolin; mobic [meloxicam]; nickel; nitrates, organic; petrolatum; soy allergy  (obsolete); and strawberry extract.  MEDICATIONS:  Current Outpatient Medications  Medication Sig Dispense Refill   amLODipine  (NORVASC ) 5 MG tablet Take 1 tablet (5 mg total) by mouth daily. 90 tablet 3   aspirin  EC 81 MG tablet Take 81 mg by mouth as needed.     cetirizine (ZYRTEC) 10 MG tablet Take 10 mg by mouth daily as needed for allergies.     EPINEPHrine  0.3 mg/0.3 mL IJ SOAJ injection Inject 0.3 mg into the muscle as needed for anaphylaxis. 1 each 1   Evolocumab  (REPATHA  SURECLICK) 140 MG/ML SOAJ Inject 140 mg into the skin every 14 (fourteen) days. 2 mL 11   sucralfate (CARAFATE) 1 g tablet 1 tablet on an empty stomach Orally Dx: K21.00 daily; Duration: 30 days     No current facility-administered medications for this visit.    SURGICAL HISTORY:  Past Surgical History:  Procedure Laterality Date   ESOPHAGEAL MANOMETRY N/A 03/15/2020   Procedure: ESOPHAGEAL MANOMETRY (EM);  Surgeon: Dianna Specking, MD;  Location: [facility];  Service: Endoscopy;  Laterality: N/A;   EYE SURGERY     HELLER MYOTOMY N/A 04/18/2020   Procedure: LAPAROSCOPIC HELLER MYOTOMY WITH UPPER ENDOSCOPY;  Surgeon: Kinsinger, Herlene Righter, MD;  Location: [facility];  Service: General;  Laterality: N/A;   PH IMPEDANCE STUDY N/A 03/15/2020   Procedure: PH IMPEDANCE STUDY;  Surgeon: Dianna Specking, MD;  Location: [facility];  Service: Endoscopy;  Laterality:  N/A;   SHOULDER SURGERY Bilateral    TONSILLECTOMY      REVIEW OF SYSTEMS:  Constitutional: positive for fatigue Eyes: negative Ears, nose, mouth, throat, and face: negative Respiratory: positive for  dyspnea on exertion Cardiovascular: negative Gastrointestinal: negative Genitourinary:negative Integument/breast: negative Hematologic/lymphatic: negative Musculoskeletal:negative Neurological: negative Behavioral/Psych: negative Endocrine: negative Allergic/Immunologic: negative   PHYSICAL EXAMINATION: General appearance: alert, cooperative, fatigued, and no distress Head: Normocephalic, without obvious abnormality, atraumatic Neck: no adenopathy, no JVD, supple, symmetrical, trachea midline, and thyroid  not enlarged, symmetric, no tenderness/mass/nodules Lymph nodes: Cervical, supraclavicular, and axillary nodes normal. Resp: clear to auscultation bilaterally Back: symmetric, no curvature. ROM normal. No CVA tenderness. Cardio: regular rate and rhythm, S1, S2 normal, no murmur, click, rub or gallop GI: soft, non-tender; bowel sounds normal; no masses,  no organomegaly Extremities: extremities normal, atraumatic, no cyanosis or edema Neurologic: Alert and oriented X 3, normal strength and tone. Normal symmetric reflexes. Normal coordination and gait  ECOG PERFORMANCE STATUS: 1 - Symptomatic but completely ambulatory  Blood pressure (!) 161/62, pulse (!) 59, temperature 98 F (36.7 C), resp. rate 17, weight 103 lb 11.2 oz (47 kg), SpO2 100%.  LABORATORY DATA: Lab Results  Component Value Date   WBC 3.2 (L) 09/07/2024   HGB 11.5 (L) 09/07/2024   HCT 37.3 09/07/2024   MCV 73.4 (L) 09/07/2024   PLT 97 (L) 09/07/2024      Chemistry      Component Value Date/Time   NA 143 09/07/2024 1051   NA 143 10/08/2023 1123   K 4.3 09/07/2024 1051   CL 107 09/07/2024 1051   CO2 31 09/07/2024 1051   BUN 13 09/07/2024 1051   BUN 10 10/08/2023 1123   CREATININE 0.82 09/07/2024 1051   CREATININE 0.87 04/03/2017 1059      Component Value Date/Time   CALCIUM  9.4 09/07/2024 1051   ALKPHOS 113 09/07/2024 1051   AST 23 09/07/2024 1051   ALT 12 09/07/2024 1051   BILITOT 0.5 09/07/2024  1051       RADIOGRAPHIC STUDIES: No results found.   ASSESSMENT AND PLAN: This is a very pleasant 83 years old white female with mild thrombocytopenia likely ITP in addition to mild microcytic anemia.  The patient underwent hemoglobin electrophoresis that showed normal pattern.   The patient is currently on multivitamins as well as B12 orally.  She is tolerating her treatment well. She continues to have persistent pancytopenia. Assessment and Plan Assessment & Plan Cytopenias (microcytic anemia, thrombocytopenia, and leukopenia) of unclear etiology Cytopenias with microcytic anemia, thrombocytopenia, and leukopenia of unclear etiology. Hemoglobin is 11.5, platelets are 97, and white blood cell count is 3.2. Iron studies are normal, ruling out iron deficiency anemia. Differential diagnosis includes myelodysplastic syndrome, which requires further investigation. - Consider bone marrow biopsy to evaluate for myelodysplastic syndrome or other bone marrow disorders.  Planned bone marrow biopsy for evaluation of cytopenias Bone marrow biopsy is planned to evaluate the cause of cytopenias, including the possibility of myelodysplastic syndrome. Discussed the procedure, which involves sedation and obtaining a sample from the hip bone. Explained potential side effects, including pain and risk of infection, though infection is rare. Clarified that platelets do not protect against infection; white blood cells do. - Order bone marrow biopsy to be performed in the hospital within the next week. - Schedule follow-up appointment in one month to discuss biopsy results. She was advised to call immediately if she has any concerning symptoms in the interval. The patient voices understanding of current  disease status and treatment options and is in agreement with the current care plan.  All questions were answered. The patient knows to call the clinic with any problems, questions or concerns. We can certainly  see the patient much sooner if necessary. The total time spent in the appointment was 30 minutes.  Disclaimer: This note was dictated with voice recognition software. Similar sounding words can inadvertently be transcribed and may not be corrected upon review.

## 2024-09-09 ENCOUNTER — Ambulatory Visit: Admitting: Physical Therapy

## 2024-09-09 DIAGNOSIS — M6281 Muscle weakness (generalized): Secondary | ICD-10-CM | POA: Diagnosis not present

## 2024-09-09 DIAGNOSIS — R2681 Unsteadiness on feet: Secondary | ICD-10-CM

## 2024-09-09 DIAGNOSIS — R262 Difficulty in walking, not elsewhere classified: Secondary | ICD-10-CM

## 2024-09-09 NOTE — Therapy (Signed)
 OUTPATIENT PHYSICAL THERAPY NEURO PROGRESS NOTE/RE-CERT   Patient Name: Donna Baldwin MRN: [id number] DOB:09/08/1941, 83 y.o., female Today's Date: 09/09/2024   PCP: Teresa Channel, MD REFERRING PROVIDER: Teresa Channel, MD  END OF SESSION:  PT End of Session - 09/09/24 1150     Visit Number 8    Number of Visits 12    Date for PT Re-Evaluation 10/15/24    Authorization Type [employer]    Progress Note Due on Visit 17    PT Start Time 1150    PT Stop Time 1228    PT Time Calculation (min) 38 min    Equipment Utilized During Treatment Gait belt    Activity Tolerance Patient tolerated treatment well    Behavior During Therapy WFL for tasks assessed/performed             Past Medical History:  Diagnosis Date   Acid reflux    Anemia    Anxiety    Back pain    GERD (gastroesophageal reflux disease)    Hyperlipidemia    Hypertension    Hypertensive heart disease without heart failure    Thrombocytopenia (HCC)    Vitamin D  deficiency    Past Surgical History:  Procedure Laterality Date   ESOPHAGEAL MANOMETRY N/A 03/15/2020   Procedure: ESOPHAGEAL MANOMETRY (EM);  Surgeon: Dianna Specking, MD;  Location: [facility];  Service: Endoscopy;  Laterality: N/A;   EYE SURGERY     HELLER MYOTOMY N/A 04/18/2020   Procedure: LAPAROSCOPIC HELLER MYOTOMY WITH UPPER ENDOSCOPY;  Surgeon: Kinsinger, Herlene Righter, MD;  Location: [facility];  Service: General;  Laterality: N/A;   PH IMPEDANCE STUDY N/A 03/15/2020   Procedure: PH IMPEDANCE STUDY;  Surgeon: Dianna Specking, MD;  Location: [facility];  Service: Endoscopy;  Laterality: N/A;   SHOULDER SURGERY Bilateral    TONSILLECTOMY     Patient Active Problem List   Diagnosis Date Noted   Elevated d-dimer 01/22/2024   Diastolic dysfunction 01/22/2024   PVD (peripheral vascular disease) (HCC) 01/22/2024   Leukopenia 01/22/2024   Anxiety 01/22/2024   Weight loss 01/22/2024   Dysphagia 04/25/2023   Chest pain  08/06/2022   Bradycardia 08/05/2022   Circadian rhythm sleep disorder, advanced sleep phase type 08/07/2021   Bradycardia with 31-40 beats per minute 08/07/2021   Primary hypertension 08/07/2021   Snoring 08/07/2021   Excessive daytime sleepiness 08/07/2021   Palpitations 08/07/2021   Microcytic anemia 04/11/2021   Achalasia 04/18/2020   Abnormal CT of liver 12/20/2019   Coronary artery disease involving native coronary artery of native heart without angina pectoris 12/20/2019   Palpitation 02/08/2019   Interstitial cystitis 08/25/2018   Nonintractable headache    TIA (transient ischemic attack) 07/06/2018   History of diabetes mellitus 04/01/2017   Transient retinal artery occlusion of both eyes 07/07/2014   Hereditary and idiopathic peripheral neuropathy 07/07/2014   Aortic atherosclerosis (HCC) 12/03/2013   Mixed hyperlipidemia 12/03/2013   GERD (gastroesophageal reflux disease) 03/24/2013   Obstructive sleep apnea, ruled out 02/28/2013   Urticaria due to food allergy  08/31/2012   Thrombocytopenia (HCC)     ONSET DATE:   REFERRING DIAG: I73.9 (ICD-10-CM) - Peripheral vascular disease, unspecified  THERAPY DIAG:  Muscle weakness (generalized)  Difficulty in walking, not elsewhere classified  Unsteadiness on feet  Rationale for Evaluation and Treatment: Rehabilitation  SUBJECTIVE:  SUBJECTIVE STATEMENT: Pt states app continues to work well. Hip has not been giving her any issues. Pt states they are going to aspirate her bone marrow -- not sure why her WBC is low.   Pt accompanied by: significant other in waiting   PERTINENT HISTORY: PMH: HTN, HLD, anxiety, anemia, GERD, thrombocytopenia  PAIN:  Are you having pain? No, reports occasionally in R LB  PRECAUTIONS: None  RED  FLAGS: None   WEIGHT BEARING RESTRICTIONS: No  FALLS: Has patient fallen in last 6 months? No  LIVING ENVIRONMENT: Lives with: lives with their spouse Lives in: House/apartment Stairs: Yes: External: 4 steps; on right going up Has following equipment at home: Grab bars and None  PLOF: Independent  PATIENT GOALS: figure out what I can do for exercise.   OBJECTIVE:   TODAY'S TREATMENT: 09/09/24 Activity Comments  Nustep L5 2x 5 min UEs/LEs For endurance maintaining >70 SPM; 0.41 miles  Sit<>stand 4# 3x5   Staggered stance diagonals 4# x10    Hip abd red TB x10 Hip ext red TB x10 Resistance around ankles  Half tandem stance 2x30        TODAY'S TREATMENT: 09/06/24 Activity Comments  5xSTS 15.92 sec with B armrests   Berg 45/56   MCTSIB #2 30 sec with mild sway                  PATIENT EDUCATION: Education details: answered pt's questions on exercise after therapy and on exercise benefits for PVD, discussed progress towards goals and remaining impairments, POC and schedule Person educated: Patient Education method: Explanation, Demonstration, Tactile cues, and Verbal cues Education comprehension: verbalized understanding     HOME EXERCISE PROGRAM: Access Code: [email or web address] URL: [email or web address] Date: 08/31/2024 Prepared by: Graves Nipp April Earnie Starring  Exercises - Sit to Stand  - 1 x daily - 7 x weekly - 5 sets - 5 reps - Corner Balance Feet Together With Eyes Closed  - 1 x daily - 7 x weekly - 3 sets - 30 sec hold - Romberg Stance with Head Nods  - 1 x daily - 7 x weekly - 3 sets - 30 sec hold - Romberg Stance with Head Rotation  - 1 x daily - 7 x weekly - 3 sets - 30 sec hold - Standing Diagonal Chops with Medicine Ball  - 1 x daily - 7 x weekly - 2 sets - 10 reps - Heel Raises with Counter Support  - 2-3 x weekly - 3 sets - 10 reps - Mini Squat with Counter Support  - 2-3 x weekly - 3 sets - 10 reps - Standing Hip Abduction with Resistance at  Ankles and Counter Support  - 1 x daily - 7 x weekly - 3 sets - 10 reps - Hip Extension with Resistance Loop  - 1 x daily - 7 x weekly - 3 sets - 10 reps - Seated Piriformis Stretch  - 1 x daily - 7 x weekly - 2 sets - 30 sec hold - Seated Piriformis Stretch  - 1 x daily - 7 x weekly - 2 sets - 30 sec hold   Note: Objective measures were completed at Evaluation unless otherwise noted.  DIAGNOSTIC FINDINGS:   COGNITION: Overall cognitive status: Within functional limits for tasks assessed   SENSATION: Notes numbness more along dorsum of feet Reports increased sensory disturbance at night COORDINATION: intact  EDEMA:  none  MUSCLE TONE: WNL  MUSCLE LENGTH: WNL  DTRs:  NT  POSTURE:  No Significant postural limitations  LOWER EXTREMITY ROM:     WNL  LOWER EXTREMITY MMT:    RLE: 4/5 LLE: 3+/5  BED MOBILITY:  Not tested  TRANSFERS: Independent-modified indep--requires several attempts for sit to stand from chair height when not using UE  RAMP:  Not tested  CURB:  Findings: SBA-CGA  STAIRS: Not tested GAIT: Findings: Comments: decreased speed and stride length  FUNCTIONAL TESTS:  Eval: 5 times sit to stand: 30 sec 6 minute walk test: TBD 10 meter walk test: 17 sec = 1.9 ft/sec Berg Balance Test: 44/56   M-CTSIB  Condition 1: Firm Surface, EO 30 Sec, Mild and Moderate Sway  Condition 2: Firm Surface, EC 30 Sec, Moderate and Severe Sway  Condition 3: Foam Surface, EO  Sec,  Sway  Condition 4: Foam Surface, EC  Sec,  Sway    09/06/24 5xSTS 15.92 sec with B armrests   Berg 45/56   MCTSIB #2 30 sec with mild sway                                                                                                                                        GOALS: Goals reviewed with patient? Yes  SHORT TERM GOALS: Target date: 08/25/2024    Patient will be independent in HEP to improve functional outcomes Baseline: Goal status: MET    LONG TERM  GOALS: Target date: 10/15/2024    Demo improved BLE strength and reduced risk for falls per time 20 sec 5xSTS test Baseline: 30 sec, requires multiple attempts for STS; 15 sec with B armrests 09/06/24 Goal status: MET 09/06/24  2.  Demo improved balance and reduce risk for falls per score 48/56 Berg Balance Test Baseline: 44/56; 45/56 09/06/24 Goal status: IN PROGRESS  3.  to increase to at least 1000' to demo MCID Baseline:  08/17/24 At 3 min rated 5/10 RPE After 6 min rated 8/10 RPE, 107/81, 96 BPM, 94% spO2 779 feet  08/31/24: 717' with intermittent standing rest breaks, RPE 6/10 Goal status: IN PROGRESS 08/31/24  4.  Demo improved postural stability per mild sway x 30 sec condition 2 M-CTSIB for improved safety during ADL Baseline: 30 sec mod-severe; 30 sec with mild sway 09/06/24 Goal status: MET 09/06/24    ASSESSMENT:  CLINICAL IMPRESSION: Donna Baldwin continues to make good gains in PT. Progressed sit to stands to include weight. Able to progress pt to red TB. Working on narrower BOS for her balance to improve her Lars. Continued focus on improving her overall endurance for activities.   OBJECTIVE IMPAIRMENTS: Abnormal gait, decreased activity tolerance, decreased balance, decreased endurance, decreased knowledge of use of DME, decreased mobility, difficulty walking, decreased strength, and pain.   ACTIVITY LIMITATIONS: carrying, sleeping, transfers, and locomotion level  PARTICIPATION LIMITATIONS: shopping and community activity  PERSONAL FACTORS: Age, Time since onset of injury/illness/exacerbation, and 1 comorbidity: PMH are also affecting  patient's functional outcome.   REHAB POTENTIAL: Excellent  CLINICAL DECISION MAKING: Evolving/moderate complexity  EVALUATION COMPLEXITY: Moderate  PLAN:  PT FREQUENCY: 2x/week  PT DURATION: 4 weeks  PLANNED INTERVENTIONS: 97750- Physical Performance Testing, 97110-Therapeutic exercises, 97530- Therapeutic activity, 97112-  Neuromuscular re-education, 97535- Self Care, [id number]- Manual therapy, [phone]- Gait training, [phone]- Aquatic Therapy, and [phone]- Electrical stimulation (unattended)  PLAN FOR NEXT SESSION: balance with turns and narrow BOS; progressions for LE muscular endurance/strengthening and additional balance challenges to progress HEP (e.g. tandem stance) as she indicates vascular claudication/neuropathy   Onaje Warne April Ma L Wardell Pokorski, PT DPT 09/09/24 11:50 AM  [facility] at [facility] [address], [address] [place], [place] [address] Phone # [phone] Fax # [phone]

## 2024-09-16 ENCOUNTER — Ambulatory Visit (HOSPITAL_COMMUNITY)

## 2024-09-17 ENCOUNTER — Telehealth: Payer: Self-pay | Admitting: Internal Medicine

## 2024-09-17 NOTE — Telephone Encounter (Signed)
 Pt called in regarding her appt and want to schedule appt.

## 2024-09-23 ENCOUNTER — Ambulatory Visit: Admitting: Physical Therapy

## 2024-09-23 ENCOUNTER — Encounter: Payer: Self-pay | Admitting: Physical Therapy

## 2024-09-23 DIAGNOSIS — M6281 Muscle weakness (generalized): Secondary | ICD-10-CM

## 2024-09-23 DIAGNOSIS — R262 Difficulty in walking, not elsewhere classified: Secondary | ICD-10-CM

## 2024-09-23 DIAGNOSIS — R2681 Unsteadiness on feet: Secondary | ICD-10-CM

## 2024-09-23 NOTE — Therapy (Signed)
 OUTPATIENT PHYSICAL THERAPY NEURO TREATMENT   Patient Name: Donna Baldwin MRN: [id number] DOB:[birth year], 83 y.o., female Today's Date: 09/23/2024   PCP: Teresa Channel, MD REFERRING PROVIDER: Teresa Channel, MD  END OF SESSION:  PT End of Session - 09/23/24 1149     Visit Number 9    Number of Visits 12    Date for Recertification  10/15/24    Authorization Type [employer]    Progress Note Due on Visit 17    PT Start Time 1149    PT Stop Time 1230    PT Time Calculation (min) 41 min    Equipment Utilized During Treatment Gait belt    Activity Tolerance Patient tolerated treatment well    Behavior During Therapy WFL for tasks assessed/performed              Past Medical History:  Diagnosis Date   Acid reflux    Anemia    Anxiety    Back pain    GERD (gastroesophageal reflux disease)    Hyperlipidemia    Hypertension    Hypertensive heart disease without heart failure    Thrombocytopenia    Vitamin D  deficiency    Past Surgical History:  Procedure Laterality Date   ESOPHAGEAL MANOMETRY N/A 03/15/2020   Procedure: ESOPHAGEAL MANOMETRY (EM);  Surgeon: Dianna Specking, MD;  Location: [facility];  Service: Endoscopy;  Laterality: N/A;   EYE SURGERY     HELLER MYOTOMY N/A 04/18/2020   Procedure: LAPAROSCOPIC HELLER MYOTOMY WITH UPPER ENDOSCOPY;  Surgeon: Kinsinger, Herlene Righter, MD;  Location: [facility];  Service: General;  Laterality: N/A;   PH IMPEDANCE STUDY N/A 03/15/2020   Procedure: PH IMPEDANCE STUDY;  Surgeon: Dianna Specking, MD;  Location: [facility];  Service: Endoscopy;  Laterality: N/A;   SHOULDER SURGERY Bilateral    TONSILLECTOMY     Patient Active Problem List   Diagnosis Date Noted   Elevated d-dimer 01/22/2024   Diastolic dysfunction 01/22/2024   PVD (peripheral vascular disease) 01/22/2024   Leukopenia 01/22/2024   Anxiety 01/22/2024   Weight loss 01/22/2024   Dysphagia 04/25/2023   Chest pain 08/06/2022   Bradycardia  08/05/2022   Circadian rhythm sleep disorder, advanced sleep phase type 08/07/2021   Bradycardia with 31-40 beats per minute 08/07/2021   Primary hypertension 08/07/2021   Snoring 08/07/2021   Excessive daytime sleepiness 08/07/2021   Palpitations 08/07/2021   Microcytic anemia 04/11/2021   Achalasia 04/18/2020   Abnormal CT of liver 12/20/2019   Coronary artery disease involving native coronary artery of native heart without angina pectoris 12/20/2019   Palpitation 02/08/2019   Interstitial cystitis 08/25/2018   Nonintractable headache    TIA (transient ischemic attack) 07/06/2018   History of diabetes mellitus 04/01/2017   Transient retinal artery occlusion of both eyes 07/07/2014   Hereditary and idiopathic peripheral neuropathy 07/07/2014   Aortic atherosclerosis 12/03/2013   Mixed hyperlipidemia 12/03/2013   GERD (gastroesophageal reflux disease) 03/24/2013   Obstructive sleep apnea, ruled out 02/28/2013   Urticaria due to food allergy  08/31/2012   Thrombocytopenia     ONSET DATE:   REFERRING DIAG: I73.9 (ICD-10-CM) - Peripheral vascular disease, unspecified  THERAPY DIAG:  Muscle weakness (generalized)  Difficulty in walking, not elsewhere classified  Unsteadiness on feet  Rationale for Evaluation and Treatment: Rehabilitation  SUBJECTIVE:  SUBJECTIVE STATEMENT: Pt states she's been doing her exercises using the app. Pt will be having surgery in October.   Pt accompanied by: significant other in waiting   PERTINENT HISTORY: PMH: HTN, HLD, anxiety, anemia, GERD, thrombocytopenia  PAIN:  Are you having pain? No, reports occasionally in R LB  PRECAUTIONS: None  RED FLAGS: None   WEIGHT BEARING RESTRICTIONS: No  FALLS: Has patient fallen in last 6 months? No  LIVING  ENVIRONMENT: Lives with: lives with their spouse Lives in: House/apartment Stairs: Yes: External: 4 steps; on right going up Has following equipment at home: Grab bars and None  PLOF: Independent  PATIENT GOALS: figure out what I can do for exercise.   OBJECTIVE:  TODAY'S TREATMENT: 09/23/24 Activity Comments  Nustep L4-L7 x12 min UEs/LEs Intermittently increased/decreased resistance; maintaining >70 SPM; 0.48 miles total  Side step red TB x 4 laps by counter Cueing for eccentric control  Forward/backward monster walk red TB x 4 laps by counter Cueing for eccentric control  Tandem stance 2x30   L<>R body turns   360 deg turn Some dizziness but able to complete in <3 sec with no LOB  Standing diagonal chops x10 each direction low/high and then high/low    TODAY'S TREATMENT: 09/09/24 Activity Comments  Nustep L5 2x 5 min UEs/LEs For endurance maintaining >70 SPM; 0.41 miles  Sit<>stand 4# 3x5   Staggered stance diagonals 4# x10    Hip abd red TB x10 Hip ext red TB x10 Resistance around ankles  Half tandem stance 2x30         PATIENT EDUCATION: Education details: HEP updates Person educated: Patient Education method: Explanation, Demonstration, Tactile cues, and Verbal cues Education comprehension: verbalized understanding     HOME EXERCISE PROGRAM: Access Code: [email or web address] URL: [email or web address] Date: 09/23/2024 Prepared by: Katana Berthold April Earnie Starring  Exercises - Sit to Stand  - 1 x daily - 7 x weekly - 5 sets - 5 reps - Corner Balance Feet Together With Eyes Closed  - 1 x daily - 7 x weekly - 3 sets - 30 sec hold - Heel Raises with Counter Support  - 2-3 x weekly - 3 sets - 10 reps - Seated Piriformis Stretch  - 1 x daily - 7 x weekly - 2 sets - 30 sec hold - Seated Piriformis Stretch  - 1 x daily - 7 x weekly - 2 sets - 30 sec hold - Side Stepping with Resistance at Ankles and Counter Support  - 1 x daily - 7 x weekly - 2 sets - 10 reps - Backward  Monster Walk with Resistance at Ankles and Counter Support  - 1 x daily - 7 x weekly - 2 sets - 10 reps - Tandem Stance  - 1 x daily - 7 x weekly - 2 sets - 30 sec hold - Standing Diagonal Lift with Anchored Resistance  - 1 x daily - 7 x weekly - 1 sets - 10 reps - Standing Diagonal Chop  - 1 x daily - 7 x weekly - 1 sets - 10 reps   Note: Objective measures were completed at Evaluation unless otherwise noted.   SENSATION: Notes numbness more along dorsum of feet Reports increased sensory disturbance at night COORDINATION: intact   LOWER EXTREMITY MMT:   RLE: 4/5 LLE: 3+/5   TRANSFERS: Independent-modified indep--requires several attempts for sit to stand from chair height when not using UE  CURB:  Findings: SBA-CGA  GAIT: Findings: Comments: decreased speed and stride  length  FUNCTIONAL TESTS:  Eval: 5 times sit to stand: 30 sec 6 minute walk test: TBD 10 meter walk test: 17 sec = 1.9 ft/sec Berg Balance Test: 44/56   M-CTSIB  Condition 1: Firm Surface, EO 30 Sec, Mild and Moderate Sway  Condition 2: Firm Surface, EC 30 Sec, Moderate and Severe Sway  Condition 3: Foam Surface, EO  Sec,  Sway  Condition 4: Foam Surface, EC  Sec,  Sway    09/06/24 5xSTS 15.92 sec with B armrests   Berg 45/56   MCTSIB #2 30 sec with mild sway                                                                                                                                        GOALS: Goals reviewed with patient? Yes  SHORT TERM GOALS: Target date: 08/25/2024    Patient will be independent in HEP to improve functional outcomes Baseline: Goal status: MET    LONG TERM GOALS: Target date: 10/15/2024    Demo improved BLE strength and reduced risk for falls per time 20 sec 5xSTS test Baseline: 30 sec, requires multiple attempts for STS; 15 sec with B armrests 09/06/24 Goal status: MET 09/06/24  2.  Demo improved balance and reduce risk for falls per score 48/56 Berg Balance  Test Baseline: 44/56; 45/56 09/06/24 Goal status: IN PROGRESS  3.  to increase to at least 1000' to demo MCID Baseline:  08/17/24 At 3 min rated 5/10 RPE After 6 min rated 8/10 RPE, 107/81, 96 BPM, 94% spO2 779 feet  08/31/24: 717' with intermittent standing rest breaks, RPE 6/10 Goal status: IN PROGRESS 08/31/24  4.  Demo improved postural stability per mild sway x 30 sec condition 2 M-CTSIB for improved safety during ADL Baseline: 30 sec mod-severe; 30 sec with mild sway 09/06/24 Goal status: MET 09/06/24    ASSESSMENT:  CLINICAL IMPRESSION: Pt able to tolerate a full 12 min on nustep today without requiring rest demonstrating increase in her endurance -- may try and amb outside next session if the weather permits to assess 6 min walk. Pt has improved aspects of her Lars Balance that she had LOBs in her last assessment. Progressed pt's HEP accordingly. Difficulty with maintaining eccentric control with side and backwards stepping using red TB. Pt is close to meeting her LTGs.    OBJECTIVE IMPAIRMENTS: Abnormal gait, decreased activity tolerance, decreased balance, decreased endurance, decreased knowledge of use of DME, decreased mobility, difficulty walking, decreased strength, and pain.   ACTIVITY LIMITATIONS: carrying, sleeping, transfers, and locomotion level  PARTICIPATION LIMITATIONS: shopping and community activity  PERSONAL FACTORS: Age, Time since onset of injury/illness/exacerbation, and 1 comorbidity: PMH are also affecting patient's functional outcome.   REHAB POTENTIAL: Excellent  CLINICAL DECISION MAKING: Evolving/moderate complexity  EVALUATION COMPLEXITY: Moderate  PLAN:  PT FREQUENCY: 2x/week  PT DURATION: 4 weeks  PLANNED INTERVENTIONS: 97750- Physical  Performance Testing, 97110-Therapeutic exercises, 97530- Therapeutic activity, [id number]- Neuromuscular re-education, [phone]- Self Care, 02859- Manual therapy, [phone]- Gait training, [phone]- Aquatic Therapy, and  [phone]- Electrical stimulation (unattended)  PLAN FOR NEXT SESSION: balance with turns and narrow BOS; progressions for LE muscular endurance/strengthening and additional balance challenges to progress HEP (e.g. tandem stance) as she indicates vascular claudication/neuropathy   Marlissa Emerick April Ma L Laneah Luft, PT DPT 09/23/24 11:49 AM  [facility] at [facility] [address], [address] [place], [place] [address] Phone # [phone] Fax # [phone]

## 2024-09-28 DIAGNOSIS — M810 Age-related osteoporosis without current pathological fracture: Secondary | ICD-10-CM | POA: Diagnosis not present

## 2024-09-28 DIAGNOSIS — I253 Aneurysm of heart: Secondary | ICD-10-CM | POA: Diagnosis not present

## 2024-09-28 DIAGNOSIS — E1169 Type 2 diabetes mellitus with other specified complication: Secondary | ICD-10-CM | POA: Diagnosis not present

## 2024-09-28 DIAGNOSIS — I10 Essential (primary) hypertension: Secondary | ICD-10-CM | POA: Diagnosis not present

## 2024-09-30 ENCOUNTER — Ambulatory Visit: Attending: Family Medicine

## 2024-09-30 DIAGNOSIS — M6281 Muscle weakness (generalized): Secondary | ICD-10-CM | POA: Diagnosis not present

## 2024-09-30 DIAGNOSIS — R2681 Unsteadiness on feet: Secondary | ICD-10-CM | POA: Diagnosis not present

## 2024-09-30 DIAGNOSIS — L900 Lichen sclerosus et atrophicus: Secondary | ICD-10-CM | POA: Diagnosis not present

## 2024-09-30 DIAGNOSIS — R300 Dysuria: Secondary | ICD-10-CM | POA: Diagnosis not present

## 2024-09-30 DIAGNOSIS — I5189 Other ill-defined heart diseases: Secondary | ICD-10-CM | POA: Diagnosis not present

## 2024-09-30 DIAGNOSIS — I739 Peripheral vascular disease, unspecified: Secondary | ICD-10-CM | POA: Diagnosis not present

## 2024-09-30 DIAGNOSIS — E785 Hyperlipidemia, unspecified: Secondary | ICD-10-CM | POA: Diagnosis not present

## 2024-09-30 DIAGNOSIS — G720 Drug-induced myopathy: Secondary | ICD-10-CM | POA: Diagnosis not present

## 2024-09-30 DIAGNOSIS — E1169 Type 2 diabetes mellitus with other specified complication: Secondary | ICD-10-CM | POA: Diagnosis not present

## 2024-09-30 DIAGNOSIS — E1140 Type 2 diabetes mellitus with diabetic neuropathy, unspecified: Secondary | ICD-10-CM | POA: Diagnosis not present

## 2024-09-30 DIAGNOSIS — I10 Essential (primary) hypertension: Secondary | ICD-10-CM | POA: Diagnosis not present

## 2024-09-30 DIAGNOSIS — R262 Difficulty in walking, not elsewhere classified: Secondary | ICD-10-CM | POA: Insufficient documentation

## 2024-09-30 NOTE — Therapy (Signed)
 OUTPATIENT PHYSICAL THERAPY NEURO TREATMENT   Patient Name: Donna Baldwin MRN: [id number] DOB:09-14-1941, 83 y.o., female Today's Date: 09/30/2024   PCP: Teresa Channel, MD REFERRING PROVIDER: Teresa Channel, MD  END OF SESSION:  PT End of Session - 09/30/24 1314     Visit Number 10    Number of Visits 12    Date for Recertification  10/15/24    Authorization Type [employer]    Progress Note Due on Visit 17    PT Start Time 1315    PT Stop Time 1400    PT Time Calculation (min) 45 min    Equipment Utilized During Treatment Gait belt    Activity Tolerance Patient tolerated treatment well    Behavior During Therapy WFL for tasks assessed/performed              Past Medical History:  Diagnosis Date   Acid reflux    Anemia    Anxiety    Back pain    GERD (gastroesophageal reflux disease)    Hyperlipidemia    Hypertension    Hypertensive heart disease without heart failure    Thrombocytopenia    Vitamin D  deficiency    Past Surgical History:  Procedure Laterality Date   ESOPHAGEAL MANOMETRY N/A 03/15/2020   Procedure: ESOPHAGEAL MANOMETRY (EM);  Surgeon: Dianna Specking, MD;  Location: [facility];  Service: Endoscopy;  Laterality: N/A;   EYE SURGERY     HELLER MYOTOMY N/A 04/18/2020   Procedure: LAPAROSCOPIC HELLER MYOTOMY WITH UPPER ENDOSCOPY;  Surgeon: Kinsinger, Herlene Righter, MD;  Location: [facility];  Service: General;  Laterality: N/A;   PH IMPEDANCE STUDY N/A 03/15/2020   Procedure: PH IMPEDANCE STUDY;  Surgeon: Dianna Specking, MD;  Location: [facility];  Service: Endoscopy;  Laterality: N/A;   SHOULDER SURGERY Bilateral    TONSILLECTOMY     Patient Active Problem List   Diagnosis Date Noted   Elevated d-dimer 01/22/2024   Diastolic dysfunction 01/22/2024   PVD (peripheral vascular disease) 01/22/2024   Leukopenia 01/22/2024   Anxiety 01/22/2024   Weight loss 01/22/2024   Dysphagia 04/25/2023   Chest pain 08/06/2022   Bradycardia  08/05/2022   Circadian rhythm sleep disorder, advanced sleep phase type 08/07/2021   Bradycardia with 31-40 beats per minute 08/07/2021   Primary hypertension 08/07/2021   Snoring 08/07/2021   Excessive daytime sleepiness 08/07/2021   Palpitations 08/07/2021   Microcytic anemia 04/11/2021   Achalasia 04/18/2020   Abnormal CT of liver 12/20/2019   Coronary artery disease involving native coronary artery of native heart without angina pectoris 12/20/2019   Palpitation 02/08/2019   Interstitial cystitis 08/25/2018   Nonintractable headache    TIA (transient ischemic attack) 07/06/2018   History of diabetes mellitus 04/01/2017   Transient retinal artery occlusion of both eyes 07/07/2014   Hereditary and idiopathic peripheral neuropathy 07/07/2014   Aortic atherosclerosis 12/03/2013   Mixed hyperlipidemia 12/03/2013   GERD (gastroesophageal reflux disease) 03/24/2013   Obstructive sleep apnea, ruled out 02/28/2013   Urticaria due to food allergy  08/31/2012   Thrombocytopenia     ONSET DATE:   REFERRING DIAG: I73.9 (ICD-10-CM) - Peripheral vascular disease, unspecified  THERAPY DIAG:  Muscle weakness (generalized)  Difficulty in walking, not elsewhere classified  Unsteadiness on feet  Rationale for Evaluation and Treatment: Rehabilitation  SUBJECTIVE:  SUBJECTIVE STATEMENT: Overall feeling better and improved endurance.   Pt accompanied by: significant other in waiting   PERTINENT HISTORY: PMH: HTN, HLD, anxiety, anemia, GERD, thrombocytopenia  PAIN:  Are you having pain? No, reports occasionally in R LB  PRECAUTIONS: None  RED FLAGS: None   WEIGHT BEARING RESTRICTIONS: No  FALLS: Has patient fallen in last 6 months? No  LIVING ENVIRONMENT: Lives with: lives with their  spouse Lives in: House/apartment Stairs: Yes: External: 4 steps; on right going up Has following equipment at home: Grab bars and None  PLOF: Independent  PATIENT GOALS: figure out what I can do for exercise.   OBJECTIVE:   TODAY'S TREATMENT: 09/30/24 Activity Comments  NU-step level 4 2 min warm-up 20 sec fast pace, 10 sec recovery x 4 rounds (Tabata) 60 sec recovery pace Tabata round 2  Static balance -standing on foam Eo/EC x 30 sec. Head turns EO/EC. Cone taps x 60 sec. Rocker board  Dynamic balance -tandem walk/retrowalk 2x45 ft SBA             TODAY'S TREATMENT: 09/23/24 Activity Comments  Nustep L4-L7 x12 min UEs/LEs Intermittently increased/decreased resistance; maintaining >70 SPM; 0.48 miles total  Side step red TB x 4 laps by counter Cueing for eccentric control  Forward/backward monster walk red TB x 4 laps by counter Cueing for eccentric control  Tandem stance 2x30   L<>R body turns   360 deg turn Some dizziness but able to complete in <3 sec with no LOB  Standing diagonal chops x10 each direction low/high and then high/low    TODAY'S TREATMENT: 09/09/24 Activity Comments  Nustep L5 2x 5 min UEs/LEs For endurance maintaining >70 SPM; 0.41 miles  Sit<>stand 4# 3x5   Staggered stance diagonals 4# x10    Hip abd red TB x10 Hip ext red TB x10 Resistance around ankles  Half tandem stance 2x30         PATIENT EDUCATION: Education details: HEP updates Person educated: Patient Education method: Explanation, Demonstration, Tactile cues, and Verbal cues Education comprehension: verbalized understanding     HOME EXERCISE PROGRAM: Access Code: [email or web address] URL: [email or web address] Date: 09/23/2024 Prepared by: Gellen April Earnie Starring  Exercises - Sit to Stand  - 1 x daily - 7 x weekly - 5 sets - 5 reps - Corner Balance Feet Together With Eyes Closed  - 1 x daily - 7 x weekly - 3 sets - 30 sec hold - Heel Raises with Counter Support  - 2-3 x  weekly - 3 sets - 10 reps - Seated Piriformis Stretch  - 1 x daily - 7 x weekly - 2 sets - 30 sec hold - Seated Piriformis Stretch  - 1 x daily - 7 x weekly - 2 sets - 30 sec hold - Side Stepping with Resistance at Ankles and Counter Support  - 1 x daily - 7 x weekly - 2 sets - 10 reps - Backward Monster Walk with Resistance at Ankles and Counter Support  - 1 x daily - 7 x weekly - 2 sets - 10 reps - Tandem Stance  - 1 x daily - 7 x weekly - 2 sets - 30 sec hold - Standing Diagonal Lift with Anchored Resistance  - 1 x daily - 7 x weekly - 1 sets - 10 reps - Standing Diagonal Chop  - 1 x daily - 7 x weekly - 1 sets - 10 reps - Tandem Walking with Counter Support  - 1 x daily -  7 x weekly - 3 sets - 10 reps   Note: Objective measures were completed at Evaluation unless otherwise noted.   SENSATION: Notes numbness more along dorsum of feet Reports increased sensory disturbance at night COORDINATION: intact   LOWER EXTREMITY MMT:   RLE: 4/5 LLE: 3+/5   TRANSFERS: Independent-modified indep--requires several attempts for sit to stand from chair height when not using UE  CURB:  Findings: SBA-CGA  GAIT: Findings: Comments: decreased speed and stride length  FUNCTIONAL TESTS:  Eval: 5 times sit to stand: 30 sec 6 minute walk test: TBD 10 meter walk test: 17 sec = 1.9 ft/sec Berg Balance Test: 44/56   M-CTSIB  Condition 1: Firm Surface, EO 30 Sec, Mild and Moderate Sway  Condition 2: Firm Surface, EC 30 Sec, Moderate and Severe Sway  Condition 3: Foam Surface, EO  Sec,  Sway  Condition 4: Foam Surface, EC  Sec,  Sway    09/06/24 5xSTS 15.92 sec with B armrests   Berg 45/56   MCTSIB #2 30 sec with mild sway                                                                                                                                        GOALS: Goals reviewed with patient? Yes  SHORT TERM GOALS: Target date: 08/25/2024    Patient will be independent in HEP to  improve functional outcomes Baseline: Goal status: MET    LONG TERM GOALS: Target date: 10/15/2024    Demo improved BLE strength and reduced risk for falls per time 20 sec 5xSTS test Baseline: 30 sec, requires multiple attempts for STS; 15 sec with B armrests 09/06/24 Goal status: MET 09/06/24  2.  Demo improved balance and reduce risk for falls per score 48/56 Berg Balance Test Baseline: 44/56; 45/56 09/06/24 Goal status: IN PROGRESS  3.  to increase to at least 1000' to demo MCID Baseline:  08/17/24 At 3 min rated 5/10 RPE After 6 min rated 8/10 RPE, 107/81, 96 BPM, 94% spO2 779 feet  08/31/24: 717' with intermittent standing rest breaks, RPE 6/10 Goal status: IN PROGRESS 08/31/24  4.  Demo improved postural stability per mild sway x 30 sec condition 2 M-CTSIB for improved safety during ADL Baseline: 30 sec mod-severe; 30 sec with mild sway 09/06/24 Goal status: MET 09/06/24    ASSESSMENT:  CLINICAL IMPRESSION: Instructed in high intensity interval training w/ nustep for cardiovascular endurance for improved activity tolerance. Balance training to facilitate righting reactions and reactive balance control with good tolerance throughout and no need for rest periods during session. Anticipate D/C at next session following assessment.   OBJECTIVE IMPAIRMENTS: Abnormal gait, decreased activity tolerance, decreased balance, decreased endurance, decreased knowledge of use of DME, decreased mobility, difficulty walking, decreased strength, and pain.   ACTIVITY LIMITATIONS: carrying, sleeping, transfers, and locomotion level  PARTICIPATION LIMITATIONS: shopping and community activity  PERSONAL  FACTORS: Age, Time since onset of injury/illness/exacerbation, and 1 comorbidity: PMH are also affecting patient's functional outcome.   REHAB POTENTIAL: Excellent  CLINICAL DECISION MAKING: Evolving/moderate complexity  EVALUATION COMPLEXITY: Moderate  PLAN:  PT FREQUENCY: 2x/week  PT  DURATION: 4 weeks  PLANNED INTERVENTIONS: 97750- Physical Performance Testing, 97110-Therapeutic exercises, 97530- Therapeutic activity, [id number]- Neuromuscular re-education, 97535- Self Care, 02859- Manual therapy, [phone]- Gait training, [phone]- Aquatic Therapy, and [phone]- Electrical stimulation (unattended)  PLAN FOR NEXT SESSION: D/C assessment   Jonette MARLA Sandifer, PT DPT 09/30/24 1:15 PM  [facility] at [facility] [address] [place], [place] [address] Phone # [phone] Fax # [phone]

## 2024-10-06 ENCOUNTER — Ambulatory Visit: Admitting: Internal Medicine

## 2024-10-06 ENCOUNTER — Other Ambulatory Visit

## 2024-10-06 NOTE — Therapy (Signed)
 OUTPATIENT PHYSICAL THERAPY NEURO TREATMENT   Patient Name: Donna Baldwin MRN: [id number] DOB:1941/09/02, 83 y.o., female Today's Date: 10/06/2024   PCP: Teresa Channel, MD REFERRING PROVIDER: Teresa Channel, MD  END OF SESSION:        Past Medical History:  Diagnosis Date   Acid reflux    Anemia    Anxiety    Back pain    GERD (gastroesophageal reflux disease)    Hyperlipidemia    Hypertension    Hypertensive heart disease without heart failure    Thrombocytopenia    Vitamin D  deficiency    Past Surgical History:  Procedure Laterality Date   ESOPHAGEAL MANOMETRY N/A 03/15/2020   Procedure: ESOPHAGEAL MANOMETRY (EM);  Surgeon: Dianna Specking, MD;  Location: [facility];  Service: Endoscopy;  Laterality: N/A;   EYE SURGERY     HELLER MYOTOMY N/A 04/18/2020   Procedure: LAPAROSCOPIC HELLER MYOTOMY WITH UPPER ENDOSCOPY;  Surgeon: Kinsinger, Herlene Righter, MD;  Location: [facility];  Service: General;  Laterality: N/A;   PH IMPEDANCE STUDY N/A 03/15/2020   Procedure: PH IMPEDANCE STUDY;  Surgeon: Dianna Specking, MD;  Location: [facility];  Service: Endoscopy;  Laterality: N/A;   SHOULDER SURGERY Bilateral    TONSILLECTOMY     Patient Active Problem List   Diagnosis Date Noted   Elevated d-dimer 01/22/2024   Diastolic dysfunction 01/22/2024   PVD (peripheral vascular disease) 01/22/2024   Leukopenia 01/22/2024   Anxiety 01/22/2024   Weight loss 01/22/2024   Dysphagia 04/25/2023   Chest pain 08/06/2022   Bradycardia 08/05/2022   Circadian rhythm sleep disorder, advanced sleep phase type 08/07/2021   Bradycardia with 31-40 beats per minute 08/07/2021   Primary hypertension 08/07/2021   Snoring 08/07/2021   Excessive daytime sleepiness 08/07/2021   Palpitations 08/07/2021   Microcytic anemia 04/11/2021   Achalasia 04/18/2020   Abnormal CT of liver 12/20/2019   Coronary artery disease involving native coronary artery of native heart without angina pectoris  12/20/2019   Palpitation 02/08/2019   Interstitial cystitis 08/25/2018   Nonintractable headache    TIA (transient ischemic attack) 07/06/2018   History of diabetes mellitus 04/01/2017   Transient retinal artery occlusion of both eyes 07/07/2014   Hereditary and idiopathic peripheral neuropathy 07/07/2014   Aortic atherosclerosis 12/03/2013   Mixed hyperlipidemia 12/03/2013   GERD (gastroesophageal reflux disease) 03/24/2013   Obstructive sleep apnea, ruled out 02/28/2013   Urticaria due to food allergy  08/31/2012   Thrombocytopenia     ONSET DATE:   REFERRING DIAG: I73.9 (ICD-10-CM) - Peripheral vascular disease, unspecified  THERAPY DIAG:  No diagnosis found.  Rationale for Evaluation and Treatment: Rehabilitation  SUBJECTIVE:  SUBJECTIVE STATEMENT: Overall feeling better and improved endurance.   Pt accompanied by: significant other in waiting   PERTINENT HISTORY: PMH: HTN, HLD, anxiety, anemia, GERD, thrombocytopenia  PAIN:  Are you having pain? No, reports occasionally in R LB  PRECAUTIONS: None  RED FLAGS: None   WEIGHT BEARING RESTRICTIONS: No  FALLS: Has patient fallen in last 6 months? No  LIVING ENVIRONMENT: Lives with: lives with their spouse Lives in: House/apartment Stairs: Yes: External: 4 steps; on right going up Has following equipment at home: Grab bars and None  PLOF: Independent  PATIENT GOALS: figure out what I can do for exercise.   OBJECTIVE:     TODAY'S TREATMENT: 10/07/24 Activity Comments                         TODAY'S TREATMENT: 09/30/24 Activity Comments  NU-step level 4 2 min warm-up 20 sec fast pace, 10 sec recovery x 4 rounds (Tabata) 60 sec recovery pace Tabata round 2  Static balance -standing on foam Eo/EC x 30 sec. Head  turns EO/EC. Cone taps x 60 sec. Rocker board  Dynamic balance -tandem walk/retrowalk 2x45 ft SBA             TODAY'S TREATMENT: 09/23/24 Activity Comments  Nustep L4-L7 x12 min UEs/LEs Intermittently increased/decreased resistance; maintaining >70 SPM; 0.48 miles total  Side step red TB x 4 laps by counter Cueing for eccentric control  Forward/backward monster walk red TB x 4 laps by counter Cueing for eccentric control  Tandem stance 2x30   L<>R body turns   360 deg turn Some dizziness but able to complete in <3 sec with no LOB  Standing diagonal chops x10 each direction low/high and then high/low    TODAY'S TREATMENT: 09/09/24 Activity Comments  Nustep L5 2x 5 min UEs/LEs For endurance maintaining >70 SPM; 0.41 miles  Sit<>stand 4# 3x5   Staggered stance diagonals 4# x10    Hip abd red TB x10 Hip ext red TB x10 Resistance around ankles  Half tandem stance 2x30         PATIENT EDUCATION: Education details: HEP updates Person educated: Patient Education method: Explanation, Demonstration, Tactile cues, and Verbal cues Education comprehension: verbalized understanding     HOME EXERCISE PROGRAM: Access Code: [email or web address] URL: [email or web address] Date: 09/23/2024 Prepared by: Gellen April Earnie Starring  Exercises - Sit to Stand  - 1 x daily - 7 x weekly - 5 sets - 5 reps - Corner Balance Feet Together With Eyes Closed  - 1 x daily - 7 x weekly - 3 sets - 30 sec hold - Heel Raises with Counter Support  - 2-3 x weekly - 3 sets - 10 reps - Seated Piriformis Stretch  - 1 x daily - 7 x weekly - 2 sets - 30 sec hold - Seated Piriformis Stretch  - 1 x daily - 7 x weekly - 2 sets - 30 sec hold - Side Stepping with Resistance at Ankles and Counter Support  - 1 x daily - 7 x weekly - 2 sets - 10 reps - Backward Monster Walk with Resistance at Ankles and Counter Support  - 1 x daily - 7 x weekly - 2 sets - 10 reps - Tandem Stance  - 1 x daily - 7 x weekly - 2 sets -  30 sec hold - Standing Diagonal Lift with Anchored Resistance  - 1 x daily - 7 x weekly - 1 sets - 10 reps -  Standing Diagonal Chop  - 1 x daily - 7 x weekly - 1 sets - 10 reps - Tandem Walking with Counter Support  - 1 x daily - 7 x weekly - 3 sets - 10 reps   Note: Objective measures were completed at Evaluation unless otherwise noted.   SENSATION: Notes numbness more along dorsum of feet Reports increased sensory disturbance at night COORDINATION: intact   LOWER EXTREMITY MMT:   RLE: 4/5 LLE: 3+/5   TRANSFERS: Independent-modified indep--requires several attempts for sit to stand from chair height when not using UE  CURB:  Findings: SBA-CGA  GAIT: Findings: Comments: decreased speed and stride length  FUNCTIONAL TESTS:  Eval: 5 times sit to stand: 30 sec 6 minute walk test: TBD 10 meter walk test: 17 sec = 1.9 ft/sec Berg Balance Test: 44/56   M-CTSIB  Condition 1: Firm Surface, EO 30 Sec, Mild and Moderate Sway  Condition 2: Firm Surface, EC 30 Sec, Moderate and Severe Sway  Condition 3: Foam Surface, EO  Sec,  Sway  Condition 4: Foam Surface, EC  Sec,  Sway    09/06/24 5xSTS 15.92 sec with B armrests   Berg 45/56   MCTSIB #2 30 sec with mild sway                                                                                                                                        GOALS: Goals reviewed with patient? Yes  SHORT TERM GOALS: Target date: 08/25/2024    Patient will be independent in HEP to improve functional outcomes Baseline: Goal status: MET    LONG TERM GOALS: Target date: 10/15/2024    Demo improved BLE strength and reduced risk for falls per time 20 sec 5xSTS test Baseline: 30 sec, requires multiple attempts for STS; 15 sec with B armrests 09/06/24 Goal status: MET 09/06/24  2.  Demo improved balance and reduce risk for falls per score 48/56 Berg Balance Test Baseline: 44/56; 45/56 09/06/24 Goal status: IN PROGRESS  3.   to increase to at least 1000' to demo MCID Baseline:  08/17/24 At 3 min rated 5/10 RPE After 6 min rated 8/10 RPE, 107/81, 96 BPM, 94% spO2 779 feet  08/31/24: 717' with intermittent standing rest breaks, RPE 6/10 Goal status: IN PROGRESS 08/31/24  4.  Demo improved postural stability per mild sway x 30 sec condition 2 M-CTSIB for improved safety during ADL Baseline: 30 sec mod-severe; 30 sec with mild sway 09/06/24 Goal status: MET 09/06/24    ASSESSMENT:  CLINICAL IMPRESSION: Instructed in high intensity interval training w/ nustep for cardiovascular endurance for improved activity tolerance. Balance training to facilitate righting reactions and reactive balance control with good tolerance throughout and no need for rest periods during session. Anticipate D/C at next session following assessment.   OBJECTIVE IMPAIRMENTS: Abnormal gait, decreased activity tolerance, decreased balance, decreased endurance, decreased knowledge of  use of DME, decreased mobility, difficulty walking, decreased strength, and pain.   ACTIVITY LIMITATIONS: carrying, sleeping, transfers, and locomotion level  PARTICIPATION LIMITATIONS: shopping and community activity  PERSONAL FACTORS: Age, Time since onset of injury/illness/exacerbation, and 1 comorbidity: PMH are also affecting patient's functional outcome.   REHAB POTENTIAL: Excellent  CLINICAL DECISION MAKING: Evolving/moderate complexity  EVALUATION COMPLEXITY: Moderate  PLAN:  PT FREQUENCY: 2x/week  PT DURATION: 4 weeks  PLANNED INTERVENTIONS: 97750- Physical Performance Testing, 97110-Therapeutic exercises, 97530- Therapeutic activity, [id number]- Neuromuscular re-education, 97535- Self Care, 02859- Manual therapy, [phone]- Gait training, [phone]- Aquatic Therapy, and [phone]- Electrical stimulation (unattended)  PLAN FOR NEXT SESSION: D/C assessment   Slater MARLA Christians, PT DPT 10/06/24 11:12 AM  [facility] [address] [place], [place] [address] Phone # [phone] Fax # [phone]

## 2024-10-07 ENCOUNTER — Encounter: Payer: Self-pay | Admitting: Physical Therapy

## 2024-10-07 ENCOUNTER — Ambulatory Visit: Admitting: Physical Therapy

## 2024-10-07 DIAGNOSIS — R2681 Unsteadiness on feet: Secondary | ICD-10-CM

## 2024-10-07 DIAGNOSIS — R262 Difficulty in walking, not elsewhere classified: Secondary | ICD-10-CM

## 2024-10-07 DIAGNOSIS — M6281 Muscle weakness (generalized): Secondary | ICD-10-CM

## 2024-10-14 ENCOUNTER — Ambulatory Visit

## 2024-10-14 ENCOUNTER — Other Ambulatory Visit: Payer: Self-pay | Admitting: Student

## 2024-10-14 DIAGNOSIS — D696 Thrombocytopenia, unspecified: Secondary | ICD-10-CM

## 2024-10-14 NOTE — H&P (Addendum)
 Chief Complaint: Patient was seen in consultation today for pancytopenia  Referring Physician(s): Mohamed,Mohamed  Supervising Physician: Hughes Simmonds  Patient Status: [facility]  History of Present Illness: Donna Baldwin is a 83 y.o. female Jehovah's witness with history of GERD, anxiety, HLD, HTN, thrombocytopenia now with pancytopenia concerning for myelodysplastic syndrome.  She is referred to [facility] for bone marrow biopsy at the request of Dr. Sherrod.   Patient presents today in her usual state of health.  She has been NPO.  She does not take blood thinners. She is FULL CODE for procedural purposes today.   She is accompanied by her daughter who is available for post procedure care and transportation.   Past Medical History:  Diagnosis Date   Acid reflux    Anemia    Anxiety    Back pain    GERD (gastroesophageal reflux disease)    Hyperlipidemia    Hypertension    Hypertensive heart disease without heart failure    Thrombocytopenia    Vitamin D  deficiency     Past Surgical History:  Procedure Laterality Date   ESOPHAGEAL MANOMETRY N/A 03/15/2020   Procedure: ESOPHAGEAL MANOMETRY (EM);  Surgeon: Dianna Specking, MD;  Location: [facility];  Service: Endoscopy;  Laterality: N/A;   EYE SURGERY     HELLER MYOTOMY N/A 04/18/2020   Procedure: LAPAROSCOPIC HELLER MYOTOMY WITH UPPER ENDOSCOPY;  Surgeon: Kinsinger, Herlene Righter, MD;  Location: [facility];  Service: General;  Laterality: N/A;   PH IMPEDANCE STUDY N/A 03/15/2020   Procedure: PH IMPEDANCE STUDY;  Surgeon: Dianna Specking, MD;  Location: [facility];  Service: Endoscopy;  Laterality: N/A;   SHOULDER SURGERY Bilateral    TONSILLECTOMY      Allergies: Beef allergy ; Bovine (beef) protein-containing drug products; Codeine; Iodine; Iodine-131; Ivp dye [iodinated contrast media]; Latex; Morphine and codeine; Penicillins; Sertraline; Shellfish allergy ; Solu-medrol  [methylprednisolone ]; Sulfa  antibiotics; Sulfasalazine; Barbiturates; Bee pollen; Bee venom; Bioflavonoid products; Diovan [valsartan]; Gabapentin; Other; Oxycodone; Pitavastatin ; Promethazine-dm; Tape; Wellness essentials blood sugr [nutritional supplements]; Albuterol ; Ciprofloxacin ; Crestor  [rosuvastatin ]; Diltiazem hcl; Ezetimibe ; Famotidine ; Oxycodone hcl; Pred forte [prednisolone acetate]; Welchol [colesevelam]; Celebrex [celecoxib]; Lanolin; Mobic [meloxicam]; Nickel; Nitrates, organic; Petrolatum; Soy allergy  (obsolete); and Strawberry extract  Medications: Prior to Admission medications   Medication Sig Start Date End Date Taking? Authorizing Provider  amLODipine  (NORVASC ) 5 MG tablet Take 1 tablet (5 mg total) by mouth daily. 02/02/24   Ladona Heinz, MD  aspirin  EC 81 MG tablet Take 81 mg by mouth as needed. 06/27/23   [provider]  cetirizine (ZYRTEC) 10 MG tablet Take 10 mg by mouth daily as needed for allergies.    [provider]  EPINEPHrine  0.3 mg/0.3 mL IJ SOAJ injection Inject 0.3 mg into the muscle as needed for anaphylaxis. 03/22/24   Ladona Heinz, MD  Evolocumab  (REPATHA  SURECLICK) 140 MG/ML SOAJ Inject 140 mg into the skin every 14 (fourteen) days. 03/18/24   Ladona Heinz, MD  sucralfate (CARAFATE) 1 g tablet 1 tablet on an empty stomach Orally Dx: K21.00 daily; Duration: 30 days 06/11/24   [provider]     Family History  Problem Relation Age of Onset   Cancer Father    Allergic rhinitis Neg Hx    Angioedema Neg Hx    Asthma Neg Hx    Atopy Neg Hx    Eczema Neg Hx    Immunodeficiency Neg Hx    Urticaria Neg Hx     Social History   Socioeconomic History  Marital status: Married    Spouse name: Not on file   Number of children: 2   Years of education: 14   Highest education level: Not on file  Occupational History   Not on file  Tobacco Use   Smoking status: Never   Smokeless tobacco: Never  Vaping Use   Vaping status: Never Used  Substance and Sexual Activity    Alcohol use: No    Alcohol/week: 0.0 standard drinks of alcohol   Drug use: No   Sexual activity: Never  Other Topics Concern   Not on file  Social History Narrative   Right handed   Some tea  (smoothie tea for bowel movement once a week)   Lives w/ husband   Social Drivers of Corporate investment banker Strain: Not on file  Food Insecurity: No Food Insecurity (01/22/2024)   Hunger Vital Sign    Worried About Running Out of Food in the Last Year: Never true    Ran Out of Food in the Last Year: Never true  Transportation Needs: No Transportation Needs (01/22/2024)   PRAPARE - Administrator, Civil Service (Medical): No    Lack of Transportation (Non-Medical): No  Physical Activity: Not on file  Stress: Not on file  Social Connections: Socially Integrated (01/23/2024)   Social Connection and Isolation Panel    Frequency of Communication with Friends and Family: More than three times a week    Frequency of Social Gatherings with Friends and Family: More than three times a week    Attends Religious Services: More than 4 times per year    Active Member of [employer] or Organizations: Yes    Attends Banker Meetings: 1 to 4 times per year    Marital Status: Married     Review of Systems: A 12 point ROS discussed and pertinent positives are indicated in the HPI above.  All other systems are negative.  Review of Systems  Constitutional:  Negative for fatigue and fever.  Respiratory:  Negative for cough and shortness of breath.   Gastrointestinal:  Negative for abdominal distention, nausea and vomiting.  Musculoskeletal:  Negative for back pain.  Psychiatric/Behavioral:  Negative for behavioral problems and confusion.     Vital Signs: BP (!) 149/68 (BP Location: Left Arm)   Pulse 69   Temp 97.8 F (36.6 C) (Oral)   Resp 16   Ht 5' 3.5 (1.613 m)   Wt 103 lb (46.7 kg)   SpO2 100%   BMI 17.96 kg/m   Physical Exam Vitals and nursing note reviewed.   Constitutional:      General: She is not in acute distress.    Appearance: Normal appearance. She is not ill-appearing.  HENT:     Mouth/Throat:     Mouth: Mucous membranes are moist.     Pharynx: Oropharynx is clear.  Cardiovascular:     Rate and Rhythm: Normal rate and regular rhythm.  Pulmonary:     Effort: Pulmonary effort is normal.     Breath sounds: Normal breath sounds.  Abdominal:     General: Abdomen is flat. There is no distension.     Palpations: Abdomen is soft.  Skin:    General: Skin is warm and dry.  Neurological:     General: No focal deficit present.     Mental Status: She is alert and oriented to person, place, and time. Mental status is at baseline.  Psychiatric:  Mood and Affect: Mood normal.        Behavior: Behavior normal.        Thought Content: Thought content normal.        Judgment: Judgment normal.      MD Evaluation Airway: WNL Heart: WNL Abdomen: WNL Chest/ Lungs: WNL ASA  Classification: 3 Mallampati/Airway Score: Two   Imaging: No results found.  Labs:  CBC: Recent Labs    01/24/24 0236 03/16/24 1009 09/07/24 1051 10/15/24 0746  WBC 2.7* 3.3* 3.2* 4.2  HGB 11.2* 11.6* 11.5* 11.2*  HCT 35.8* 37.9 37.3 37.3  PLT 105* 116* 97* 100*    COAGS: No results for input(s): INR, APTT in the last 8760 hours.  BMP: Recent Labs    01/23/24 0253 01/24/24 0236 03/16/24 1009 09/07/24 1051  NA 141 140 141 143  K 4.0 3.4* 4.2 4.3  CL 107 106 109 107  CO2 26 26 29 31   GLUCOSE 92 95 97 77  BUN 11 12 13 13   CALCIUM  9.4 9.3 9.3 9.4  CREATININE 0.93 0.75 0.82 0.82  GFRNONAA >60 >60 >60 >60    LIVER FUNCTION TESTS: Recent Labs    10/20/23 1021 01/21/24 1625 03/16/24 1009 09/07/24 1051  BILITOT 0.5 0.7 0.4 0.5  AST 21 25 20 23   ALT 12 12 14 12   ALKPHOS 104 98 124 113  PROT 7.3 7.0 6.8 7.2  ALBUMIN  4.4 4.6 4.2 4.4    TUMOR MARKERS: No results for input(s): AFPTM, CEA, CA199, CHROMGRNA in the last  8760 hours.  Assessment and Plan: Patient with past medical history of thrombocytopenia, HTN, HLD, GERD presents with complaint of pancytopenia.  IR consulted for bone marrow biopsy at the request of Dr. Sherrod. Case reviewed by Dr. Hughes who approves patient for procedure.  Patient presents today in their usual state of health.  She has been NPO and is not currently on blood thinners.   Reviewed allergy  list with patient who is agreeable to versed  and fentanyl  with procedure today and states she has tolerated in the past.   Risks and benefits of biopsy was discussed with the patient and/or patient's family including, but not limited to bleeding, infection, damage to adjacent structures or low yield requiring additional tests.  All of the questions were answered and there is agreement to proceed.  Consent signed and in chart.   Thank you for this interesting consult.  I greatly enjoyed meeting JAALA BOHLE and look forward to participating in their care.  A copy of this report was sent to the requesting provider on this date.  Electronically Signed: Jex Strausbaugh Sue-Ellen Anniah Glick, PA 10/15/2024, 8:32 AM   I spent a total of  30 Minutes   in face to face in clinical consultation, greater than 50% of which was counseling/coordinating care for pancytopenia.

## 2024-10-15 ENCOUNTER — Ambulatory Visit (HOSPITAL_COMMUNITY)
Admission: RE | Admit: 2024-10-15 | Discharge: 2024-10-15 | Disposition: A | Source: Ambulatory Visit | Attending: Family Medicine

## 2024-10-15 ENCOUNTER — Ambulatory Visit (HOSPITAL_COMMUNITY)
Admission: RE | Admit: 2024-10-15 | Discharge: 2024-10-15 | Disposition: A | Discharge status: Home / Self Care | Source: Ambulatory Visit | Attending: Internal Medicine | Admitting: Internal Medicine

## 2024-10-15 ENCOUNTER — Encounter (HOSPITAL_COMMUNITY): Payer: Self-pay

## 2024-10-15 DIAGNOSIS — D509 Iron deficiency anemia, unspecified: Secondary | ICD-10-CM | POA: Insufficient documentation

## 2024-10-15 DIAGNOSIS — E785 Hyperlipidemia, unspecified: Secondary | ICD-10-CM | POA: Insufficient documentation

## 2024-10-15 DIAGNOSIS — D61818 Other pancytopenia: Secondary | ICD-10-CM | POA: Insufficient documentation

## 2024-10-15 DIAGNOSIS — Z79899 Other long term (current) drug therapy: Secondary | ICD-10-CM | POA: Insufficient documentation

## 2024-10-15 DIAGNOSIS — D696 Thrombocytopenia, unspecified: Secondary | ICD-10-CM | POA: Diagnosis not present

## 2024-10-15 LAB — CBC WITH DIFFERENTIAL/PLATELET
Abs Immature Granulocytes: 0 K/uL (ref 0.00–0.07)
Basophils Absolute: 0 K/uL (ref 0.0–0.1)
Basophils Relative: 1 %
Eosinophils Absolute: 0 K/uL (ref 0.0–0.5)
Eosinophils Relative: 1 %
HCT: 37.3 % (ref 36.0–46.0)
Hemoglobin: 11.2 g/dL — ABNORMAL LOW (ref 12.0–15.0)
Immature Granulocytes: 0 %
Lymphocytes Relative: 30 %
Lymphs Abs: 1.3 K/uL (ref 0.7–4.0)
MCH: 22.6 pg — ABNORMAL LOW (ref 26.0–34.0)
MCHC: 30 g/dL (ref 30.0–36.0)
MCV: 75.4 fL — ABNORMAL LOW (ref 80.0–100.0)
Monocytes Absolute: 0.3 K/uL (ref 0.1–1.0)
Monocytes Relative: 8 %
Neutro Abs: 2.5 K/uL (ref 1.7–7.7)
Neutrophils Relative %: 60 %
Platelets: 100 K/uL — ABNORMAL LOW (ref 150–400)
RBC: 4.95 MIL/uL (ref 3.87–5.11)
RDW: 15.9 % — ABNORMAL HIGH (ref 11.5–15.5)
WBC: 4.2 K/uL (ref 4.0–10.5)
nRBC: 0 % (ref 0.0–0.2)

## 2024-10-15 LAB — GLUCOSE, CAPILLARY: Glucose-Capillary: 82 mg/dL (ref 70–99)

## 2024-10-15 MED ORDER — SODIUM CHLORIDE 0.9 % IV SOLN: INTRAVENOUS | Status: DC

## 2024-10-15 MED ORDER — FENTANYL CITRATE (PF) 100 MCG/2ML IJ SOLN
INTRAMUSCULAR | Status: AC
  Filled 2024-10-15: qty 2

## 2024-10-15 MED ORDER — MIDAZOLAM HCL 2 MG/2ML IJ SOLN
INTRAMUSCULAR | Status: AC
  Filled 2024-10-15: qty 2

## 2024-10-15 MED ORDER — MIDAZOLAM HCL (PF) 2 MG/2ML IJ SOLN
INTRAMUSCULAR | Status: AC | PRN
  Administered 2024-10-15 (×2): 1 mg via INTRAVENOUS

## 2024-10-15 MED ORDER — FENTANYL CITRATE (PF) 100 MCG/2ML IJ SOLN
INTRAMUSCULAR | Status: AC | PRN
  Administered 2024-10-15: 50 ug via INTRAVENOUS

## 2024-10-15 NOTE — Progress Notes (Signed)
 Pt refuses any blood or blood products. Her paperwork is on the chart. Kacie Matthews PA with radiology stated she did not need blood refusal consent also signed.

## 2024-10-15 NOTE — Discharge Instructions (Signed)
 MAY CONTACT INTERVENTIONAL RADIOLOGY CLINIC [phone] WITH ANY QUESTIONS OR CONCERNS  MAY REMOVE DRESSING AND SHOWER TOMORROW

## 2024-10-15 NOTE — Procedures (Signed)
 Vascular and Interventional Radiology Procedure Note  Patient: WILMA MICHAELSON DOB: 1941-02-24 Medical Record Number: [id number] Note Date/Time: 10/15/24 9:18 AM   Performing Physician: Thom Hall, MD Assistant(s): None  Diagnosis: Pancytopenia. Q MDS   Procedure: BONE MARROW ASPIRATION and BIOPSY  Anesthesia: Conscious Sedation Complications: None Estimated Blood Loss: Minimal Specimens: Sent for Pathology  Findings:  Successful CT-guided bone marrow aspiration and biopsy A total of 1 cores were obtained. Hemostasis of the tract was achieved using Manual Pressure.  Plan: Bed rest for 1 hours.  See detailed procedure note with images in PACS. The patient tolerated the procedure well without incident or complication and was returned to Recovery in stable condition.    Thom Hall, MD [facility]   Pager. [phone] Clinic. [phone]

## 2024-10-18 DIAGNOSIS — Z20828 Contact with and (suspected) exposure to other viral communicable diseases: Secondary | ICD-10-CM | POA: Diagnosis not present

## 2024-10-18 DIAGNOSIS — U071 COVID-19: Secondary | ICD-10-CM | POA: Diagnosis not present

## 2024-10-18 DIAGNOSIS — R059 Cough, unspecified: Secondary | ICD-10-CM | POA: Diagnosis not present

## 2024-10-19 LAB — SURGICAL PATHOLOGY

## 2024-10-20 ENCOUNTER — Telehealth: Payer: Self-pay | Admitting: Internal Medicine

## 2024-10-20 NOTE — Telephone Encounter (Signed)
 Rescheduled appointments with the patient per her having COVID. The patient is aware of the changes made.

## 2024-10-21 ENCOUNTER — Inpatient Hospital Stay

## 2024-10-21 ENCOUNTER — Inpatient Hospital Stay: Admitting: Internal Medicine

## 2024-10-26 ENCOUNTER — Emergency Department (HOSPITAL_COMMUNITY)

## 2024-10-26 ENCOUNTER — Other Ambulatory Visit: Payer: Self-pay

## 2024-10-26 ENCOUNTER — Encounter (HOSPITAL_COMMUNITY): Payer: Self-pay | Admitting: Internal Medicine

## 2024-10-26 ENCOUNTER — Observation Stay (HOSPITAL_COMMUNITY)
Admission: EM | Admit: 2024-10-26 | Discharge: 2024-10-28 | Disposition: A | Discharge status: Home / Self Care | Attending: Emergency Medicine | Admitting: Emergency Medicine

## 2024-10-26 ENCOUNTER — Encounter (HOSPITAL_COMMUNITY): Payer: Self-pay

## 2024-10-26 DIAGNOSIS — R0602 Shortness of breath: Secondary | ICD-10-CM

## 2024-10-26 DIAGNOSIS — R0902 Hypoxemia: Secondary | ICD-10-CM | POA: Diagnosis present

## 2024-10-26 DIAGNOSIS — Z91040 Latex allergy status: Secondary | ICD-10-CM | POA: Diagnosis not present

## 2024-10-26 DIAGNOSIS — J439 Emphysema, unspecified: Secondary | ICD-10-CM | POA: Diagnosis not present

## 2024-10-26 DIAGNOSIS — Z8616 Personal history of COVID-19: Secondary | ICD-10-CM | POA: Diagnosis not present

## 2024-10-26 DIAGNOSIS — I10 Essential (primary) hypertension: Secondary | ICD-10-CM | POA: Insufficient documentation

## 2024-10-26 DIAGNOSIS — F419 Anxiety disorder, unspecified: Secondary | ICD-10-CM | POA: Insufficient documentation

## 2024-10-26 DIAGNOSIS — E785 Hyperlipidemia, unspecified: Secondary | ICD-10-CM | POA: Diagnosis not present

## 2024-10-26 DIAGNOSIS — U071 COVID-19: Secondary | ICD-10-CM | POA: Diagnosis not present

## 2024-10-26 DIAGNOSIS — R059 Cough, unspecified: Secondary | ICD-10-CM | POA: Diagnosis not present

## 2024-10-26 DIAGNOSIS — I499 Cardiac arrhythmia, unspecified: Secondary | ICD-10-CM | POA: Diagnosis not present

## 2024-10-26 DIAGNOSIS — J9601 Acute respiratory failure with hypoxia: Secondary | ICD-10-CM | POA: Insufficient documentation

## 2024-10-26 DIAGNOSIS — I491 Atrial premature depolarization: Secondary | ICD-10-CM | POA: Diagnosis not present

## 2024-10-26 DIAGNOSIS — K219 Gastro-esophageal reflux disease without esophagitis: Secondary | ICD-10-CM | POA: Diagnosis not present

## 2024-10-26 DIAGNOSIS — R079 Chest pain, unspecified: Secondary | ICD-10-CM | POA: Diagnosis not present

## 2024-10-26 LAB — PRO BRAIN NATRIURETIC PEPTIDE: Pro Brain Natriuretic Peptide: 223 pg/mL (ref <300.0)

## 2024-10-26 LAB — CBC WITH DIFFERENTIAL/PLATELET
Abs Immature Granulocytes: 0 K/uL (ref 0.00–0.07)
Basophils Absolute: 0 K/uL (ref 0.0–0.1)
Basophils Relative: 0 %
Eosinophils Absolute: 0 K/uL (ref 0.0–0.5)
Eosinophils Relative: 0 %
HCT: 38.5 % (ref 36.0–46.0)
Hemoglobin: 11.3 g/dL — ABNORMAL LOW (ref 12.0–15.0)
Immature Granulocytes: 0 %
Lymphocytes Relative: 31 %
Lymphs Abs: 0.8 K/uL (ref 0.7–4.0)
MCH: 21.9 pg — ABNORMAL LOW (ref 26.0–34.0)
MCHC: 29.4 g/dL — ABNORMAL LOW (ref 30.0–36.0)
MCV: 74.6 fL — ABNORMAL LOW (ref 80.0–100.0)
Monocytes Absolute: 0.3 K/uL (ref 0.1–1.0)
Monocytes Relative: 11 %
Neutro Abs: 1.4 K/uL — ABNORMAL LOW (ref 1.7–7.7)
Neutrophils Relative %: 58 %
Platelets: 160 K/uL (ref 150–400)
RBC: 5.16 MIL/uL — ABNORMAL HIGH (ref 3.87–5.11)
RDW: 15.1 % (ref 11.5–15.5)
WBC: 2.4 K/uL — ABNORMAL LOW (ref 4.0–10.5)
nRBC: 0 % (ref 0.0–0.2)

## 2024-10-26 LAB — TROPONIN T, HIGH SENSITIVITY
Troponin T High Sensitivity: 16 ng/L (ref 0–19)
Troponin T High Sensitivity: <15 ng/L (ref 0–19)

## 2024-10-26 LAB — BASIC METABOLIC PANEL WITH GFR
Anion gap: 11 (ref 5–15)
BUN: 8 mg/dL (ref 8–23)
CO2: 26 mmol/L (ref 22–32)
Calcium: 9.6 mg/dL (ref 8.9–10.3)
Chloride: 105 mmol/L (ref 98–111)
Creatinine, Ser: 0.76 mg/dL (ref 0.44–1.00)
GFR, Estimated: >60 mL/min (ref >60)
Glucose, Bld: 108 mg/dL — ABNORMAL HIGH (ref 70–99)
Potassium: 3.5 mmol/L (ref 3.5–5.1)
Sodium: 142 mmol/L (ref 135–145)

## 2024-10-26 MED ORDER — BENZONATATE 100 MG PO CAPS
100.0000 mg | ORAL_CAPSULE | Freq: Once | ORAL | Status: AC
  Administered 2024-10-26: 100 mg via ORAL
  Filled 2024-10-26: qty 1

## 2024-10-26 MED ORDER — IPRATROPIUM-ALBUTEROL 0.5-2.5 (3) MG/3ML IN SOLN
3.0000 mL | Freq: Once | RESPIRATORY_TRACT | Status: AC
Start: 2024-10-26 — End: 2024-10-26
  Administered 2024-10-26: 3 mL via RESPIRATORY_TRACT
  Filled 2024-10-26: qty 3

## 2024-10-26 MED ORDER — ENOXAPARIN SODIUM 30 MG/0.3ML IJ SOSY
30.0000 mg | PREFILLED_SYRINGE | INTRAMUSCULAR | Status: DC
  Administered 2024-10-26 – 2024-10-27 (×2): 30 mg via SUBCUTANEOUS
  Filled 2024-10-26 (×2): qty 0.3

## 2024-10-26 MED ORDER — ONDANSETRON HCL 4 MG/2ML IJ SOLN: 4.0000 mg | Freq: Four times a day (QID) | INTRAMUSCULAR | Status: DC | PRN

## 2024-10-26 MED ORDER — ACETAMINOPHEN 325 MG PO TABS: 650.0000 mg | ORAL_TABLET | Freq: Four times a day (QID) | ORAL | Status: DC | PRN

## 2024-10-26 MED ORDER — ALBUTEROL SULFATE HFA 108 (90 BASE) MCG/ACT IN AERS: 1.0000 | INHALATION_SPRAY | Freq: Four times a day (QID) | RESPIRATORY_TRACT | 0 refills | Status: DC | PRN

## 2024-10-26 MED ORDER — BENZONATATE 100 MG PO CAPS: 100.0000 mg | ORAL_CAPSULE | Freq: Three times a day (TID) | ORAL | 0 refills | Status: DC

## 2024-10-26 MED ORDER — AMLODIPINE BESYLATE 5 MG PO TABS
5.0000 mg | ORAL_TABLET | Freq: Once | ORAL | Status: AC
  Administered 2024-10-26: 5 mg via ORAL
  Filled 2024-10-26: qty 1

## 2024-10-26 NOTE — H&P (Signed)
 History and Physical  Donna Baldwin FMW:[id number] DOB: 1941-01-11 DOA: 10/26/2024  PCP: Teresa Channel, MD   PCP: Teresa Channel, MD    CC: Hypoxia   HPI: Donna Baldwin is a 83 y.o. female with medical history significant for hypertension, anxiety, GERD, hyper lipidemia diagnosed with COVID about 10 days ago presents due to persistent cough and found to have desaturation to 88% with ambulation.  Patient without fevers, chest pain, no sputum production.  She took a course of oral azithromycin  which just finished yesterday.  She had some coughing spells overnight, to the point that she was almost having trouble catching her breath so she came to the ER for evaluation.  Workup is detailed below is relatively benign, patient has been stable on room air and saturating 100% at rest.  However when ambulating, O2 saturation dropped to 88%.  She lives independently with her husband and they are quite active.  Hospitalist was contacted to consider hospital admission.     Review of Systems: Please see HPI for pertinent positives and negatives. A complete 10 system review of systems are otherwise negative.       Past Medical History:  Diagnosis Date   Acid reflux     Anemia     Anxiety     Back pain     GERD (gastroesophageal reflux disease)     Hyperlipidemia     Hypertension     Hypertensive heart disease without heart failure     Thrombocytopenia     Vitamin D  deficiency               Past Surgical History:  Procedure Laterality Date   ESOPHAGEAL MANOMETRY N/A 03/15/2020    Procedure: ESOPHAGEAL MANOMETRY (EM);  Surgeon: Dianna Specking, MD;  Location: [facility];  Service: Endoscopy;  Laterality: N/A;   EYE SURGERY       HELLER MYOTOMY N/A 04/18/2020    Procedure: LAPAROSCOPIC HELLER MYOTOMY WITH UPPER ENDOSCOPY;  Surgeon: Kinsinger, Herlene Righter, MD;  Location: [facility];  Service: General;  Laterality: N/A;   PH IMPEDANCE STUDY N/A 03/15/2020    Procedure: PH IMPEDANCE  STUDY;  Surgeon: Dianna Specking, MD;  Location: [facility];  Service: Endoscopy;  Laterality: N/A;   SHOULDER SURGERY Bilateral     TONSILLECTOMY              Social History:  reports that she has never smoked. She has never used smokeless tobacco. She reports that she does not drink alcohol and does not use drugs.   Allergies       Allergies  Allergen Reactions   Beef Allergy  Anaphylaxis   Bovine (Beef) Protein-Containing Drug Products Anaphylaxis   Codeine Anxiety and Hives   Iodine Anaphylaxis   Iodine-131 Anaphylaxis   Ivp Dye [Iodinated Contrast Media] Anaphylaxis and Other (See Comments)   Latex Rash   Morphine And Codeine Other (See Comments)      Tremors, increased heart rate, excitement, confusion per pt   Penicillins Hives            Sertraline Other (See Comments)      Numbness in mouth and hyperactivity   Shellfish Allergy  Hives   Solu-Medrol  [Methylprednisolone ] Other (See Comments)      Numbness and tingling in mouth   Sulfa Antibiotics Hives and Rash   Sulfasalazine Hives and Rash   Barbiturates Other (See Comments)      Excitement     Bee Pollen Hives   Bee Venom Hives  Bioflavonoid Products        Other reaction(s): Other (See Comments) Nuts, strawberries, bicarbonate of soda, Nuts, strawberries, bicarbonate of soda,   Diovan [Valsartan] Nausea Only   Gabapentin Other (See Comments)      Visual disturbance   Other Palpitations, Other (See Comments) and Rash      NARCOTIC ANALGESICS-TREMORS, INCREASED HEART RATE Hmg-Coa Reductase Inhibitors - intolerance unknown     Oxycodone Swelling, Rash and Cough   Pitavastatin  Other (See Comments) and Swelling      Facial swelling   Other Reaction(s): angioedema, angioedema, angioedema   Promethazine-Dm Other (See Comments)      Felt funny   Tape Rash and Other (See Comments)      Red blotches     Wellness Essentials Blood Sugr [Nutritional Supplements] Other (See Comments)      Nuts,  strawberries, bicarbonate of soda,   Albuterol  Other (See Comments)      Caused wheezing   Ciprofloxacin  Other (See Comments)   Crestor  [Rosuvastatin ] Other (See Comments)   Diltiazem Hcl        Other reaction(s): foot sores   Ezetimibe         Other reaction(s): myalgias   Famotidine  Other (See Comments)      Unknown reaction   Oxycodone Hcl        Other reaction(s): hives, rash   Pred Forte [Prednisolone Acetate] Other (See Comments)      Patient is seeing odd color and images while using this   Welchol [Colesevelam]        Other reaction(s): myalgias   Celebrex [Celecoxib] Swelling and Other (See Comments)      Swelling and numbness (mouth)   Lanolin Itching   Mobic [Meloxicam] Swelling and Other (See Comments)      Swelling and numbness in mouth   Nickel Rash   Nitrates, Organic Rash and Other (See Comments)      Chest tightness also   Soy Allergy  (Obsolete) Rash   Strawberry Extract Rash             Family History  Problem Relation Age of Onset   Cancer Father     Allergic rhinitis Neg Hx     Angioedema Neg Hx     Asthma Neg Hx     Atopy Neg Hx     Eczema Neg Hx     Immunodeficiency Neg Hx     Urticaria Neg Hx                   Prior to Admission medications   Medication Sig Start Date End Date Taking? Authorizing Provider  amLODipine  (NORVASC ) 5 MG tablet Take 1 tablet (5 mg total) by mouth daily. 02/02/24   Yes Ladona Heinz, MD  aspirin  EC 81 MG tablet Take 81 mg by mouth every 6 (six) hours as needed for moderate pain (pain score 4-6). 06/27/23   Yes [provider]  cetirizine (ZYRTEC) 10 MG tablet Take 10 mg by mouth daily as needed for allergies.     Yes [provider]  EPINEPHrine  0.3 mg/0.3 mL IJ SOAJ injection Inject 0.3 mg into the muscle as needed for anaphylaxis. 03/22/24   Yes Ladona Heinz, MD  azithromycin  (ZITHROMAX ) 250 MG tablet Take 250 mg by mouth daily. Patient not taking: Reported on 10/26/2024 10/20/24     [provider]  Evolocumab  (REPATHA  SURECLICK) 140 MG/ML SOAJ Inject 140 mg into the skin every 14 (fourteen) days. Patient not taking: Reported  on 10/26/2024 03/18/24     Ladona Heinz, MD  triamcinolone ointment (KENALOG) 0.1 % Apply 1 Application topically 2 (two) times daily as needed (Irritation). Patient not taking: Reported on 10/26/2024       [provider]      Physical Exam: BP 106/61 (BP Location: Left Arm)   Pulse 69   Temp 97.7 F (36.5 C) (Oral)   Resp 19   SpO2 100%    General:  Alert, oriented, calm, in no acute distress  Eyes: EOMI, clear conjuctivae, white sclerea Neck: supple, no masses, trachea mildline  Cardiovascular: RRR, no murmurs or rubs, no peripheral edema  Respiratory: clear to auscultation bilaterally, no wheezes, no crackles  Abdomen: soft, nontender, nondistended, normal bowel tones heard  Skin: dry, no rashes  Musculoskeletal: no joint effusions, normal range of motion  Psychiatric: appropriate affect, normal speech  Neurologic: extraocular muscles intact, clear speech, moving all extremities with intact sensorium              Recent Labs and Imaging Reviewed:  Basic Metabolic Panel: Last Labs     Recent Labs  Lab 10/26/24 0655  NA 142  K 3.5  CL 105  CO2 26  GLUCOSE 108*  BUN 8  CREATININE 0.76  CALCIUM  9.6      Liver Function Tests: Last Labs  No results for input(s): AST, ALT, ALKPHOS, BILITOT, PROT, ALBUMIN  in the last 168 hours.   Last Labs  No results for input(s): LIPASE, AMYLASE in the last 168 hours.   Last Labs  No results for input(s): AMMONIA in the last 168 hours.   CBC: Last Labs     Recent Labs  Lab 10/26/24 0655  WBC 2.4*  NEUTROABS 1.4*  HGB 11.3*  HCT 38.5  MCV 74.6*  PLT 160      Cardiac Enzymes: Last Labs  No results for input(s): CKTOTAL, CKMB, CKMBINDEX, TROPONINI in the last 168 hours.     BNP (last 3 results) Recent Labs (within last 365 days)  No results for  input(s): BNP in the last 8760 hours.     ProBNP (last 3 results) Recent Labs (within last 365 days)  No results for input(s): PROBNP in the last 8760 hours.     CBG: Last Labs  No results for input(s): GLUCAP in the last 168 hours.     Radiological Exams on Admission:  Imaging Results (Last 48 hours)  DG Chest Portable 1 View Result Date: 10/26/2024 EXAM: 1 VIEW(S) XRAY OF THE CHEST 10/26/2024 06:59:00 AM COMPARISON: Portable chest 01/23/2024. CLINICAL HISTORY: SOB. PT BIB EMS. Per EMS PT was Dx w/ Covid 10 days ago. PT took a z-pac and zythromax. PT called EMS because she had a cough that was keeping her up tonight. FINDINGS: LUNGS AND PLEURA: Lungs are mildly emphysematous. No focal pulmonary opacity. No pulmonary edema. No pleural effusion. No pneumothorax. HEART AND MEDIASTINUM: Calcification of the transverse aorta. The heart is at the upper limits of normal in size. No vascular congestion is seen. BONES AND SOFT TISSUES: Osteopenia and mild degenerative change of the thoracic spine. IMPRESSION: 1. No acute cardiopulmonary findings. 2. Mild emphysema. 3. Cardiomediastinal silhouette at the upper limits of normal size without vascular congestion. 4. Aortic atherosclerosis. Electronically signed by: Francis Quam MD 10/26/2024 07:07 AM EDT RP Workstation: [id number]       Summary and Recommendations: Donna Baldwin is a 83 y.o. female with medical history significant for hypertension, anxiety, GERD, hyper lipidemia diagnosed with COVID  about 10 days ago presents due to persistent cough and found to have desaturation to 88% with ambulation.   Patient does not meet criteria for any COVID-specific therapy, stable on room air at rest but desaturating to the low 80s with any exertion.  She will be admitted to the hospitalist service for monitoring, and potentially discharge home with oxygen. -Benzonatate as needed, DuoNeb as needed -Continue supplemental oxygen, wean as  tolerated  Anxiety GERD Hyperlipidemia Hypertension-continue amlodipine    Donna Baldwin Gail MD [facility] Pager [phone]   If 7PM-7AM, please contact night-coverage [email or web address] Password [facility]  10/26/2024, 9:23 PM

## 2024-10-26 NOTE — ED Provider Notes (Signed)
  Physical Exam  BP 126/71 (BP Location: Left Arm)   Pulse (!) 57   Temp (!) 97.5 F (36.4 C) (Oral)   Resp 19   SpO2 100%   Physical Exam Vitals and nursing note reviewed.  Constitutional:      Appearance: She is well-developed.  HENT:     Head: Normocephalic and atraumatic.  Cardiovascular:     Rate and Rhythm: Normal rate.  Pulmonary:     Effort: Pulmonary effort is normal.  Abdominal:     Palpations: Abdomen is soft.  Neurological:     Mental Status: She is alert and oriented to person, place, and time.     Procedures  Procedures  ED Course / MDM   Clinical Course as of 10/26/24 1830  Tue Oct 26, 2024  0744 BP(!): 158/69 BP improved upon recheck 152/69 [CA]  0942 Reassessed patient at bedside.  Patient is unsure about her albuterol  allergy .  Patient willing to try breathing treatment and is aware that albuterol  is in breathing treatment.  Patient notes she is unable to take any steroids. [CA]  1030 Reassessed patient at bedside.  Patient still with shortness of breath.  Given when patient ambulated her O2 saturation got below 90 will discuss with hospitalist for admission. [CA]  1114 Discussed with Dr. Roxane with [facility] who will evaluated patient. Possibly able to discharge home if able to secure oxygen machine for home. TOC consult placed. Will discuss with pharmacy about current COVID treatment recommendations.  [CA]  1118 Discussed with pharmacy who does not feel antivirals are needed at this time given patient is 8-10 days out from symptom onset. All symptomatic treatment.  [CA]    Clinical Course User Index [CA] Lorelle Aleck BROCKS, PA-C   Medical Decision Making Amount and/or Complexity of Data Reviewed Labs: ordered. Radiology: ordered.  Risk Prescription drug management. Decision regarding hospitalization.   Patient care assumed from [place] A. PA at shift change, please see her note for full HPI.  Patient here on day 8 of COVID-19 infection with  underlying hypoxia.  Evaluated by hospitalist but she is able to have home oxygen delivered to her.  She is hypoxic with an O2 saturation around 88%.  Waited several hours for TOC to have oxygen delivered to the room, however as patient's O2 saturation was 89% at rest she does not qualify for home oxygen.  I did callback hospitalist back Dr. Roxane, who will admit patient for further management.  Will escalate this to [facility] manager.  BP 126/71 (BP Location: Left Arm)   Pulse (!) 57   Temp (!) 97.5 F (36.4 C) (Oral)   Resp 19   SpO2 100%     Portions of this note were generated with Scientist, clinical (histocompatibility and immunogenetics). Dictation errors may occur despite best attempts at proofreading.       Liesl Simons, PA-C 10/26/24 1833    Dean Clarity, MD 10/26/24 [phone]

## 2024-10-26 NOTE — Consult Note (Signed)
 [facility] Initial Consultation Note  Donna Baldwin FMW:[id number] DOB: Jun 23, 1941 DOA: 10/26/2024  PCP: Teresa Channel, MD   Requesting Physician: Aleck Sheer, PA-C  Reason for Consultation: Hypoxic respiratory failure  HPI: Donna Baldwin is a 83 y.o. female with medical history significant for hypertension, anxiety, GERD, hyper lipidemia diagnosed with COVID about 10 days ago presents due to persistent cough and found to have desaturation to 88% with ambulation.  Patient without fevers, chest pain, no sputum production.  She took a course of oral azithromycin  which just finished yesterday.  She had some coughing spells overnight, to the point that she was almost having trouble catching her breath so she came to the ER for evaluation.  Workup is detailed below is relatively benign, patient has been stable on room air and saturating 100% at rest.  However when ambulating, O2 saturation dropped to 88%.  She lives independently with her husband and they are quite active.  Hospitalist was contacted to consider hospital admission.   Review of Systems: Please see HPI for pertinent positives and negatives. A complete 10 system review of systems are otherwise negative.  Past Medical History:  Diagnosis Date   Acid reflux    Anemia    Anxiety    Back pain    GERD (gastroesophageal reflux disease)    Hyperlipidemia    Hypertension    Hypertensive heart disease without heart failure    Thrombocytopenia    Vitamin D  deficiency    Past Surgical History:  Procedure Laterality Date   ESOPHAGEAL MANOMETRY N/A 03/15/2020   Procedure: ESOPHAGEAL MANOMETRY (EM);  Surgeon: Dianna Specking, MD;  Location: [facility];  Service: Endoscopy;  Laterality: N/A;   EYE SURGERY     HELLER MYOTOMY N/A 04/18/2020   Procedure: LAPAROSCOPIC HELLER MYOTOMY WITH UPPER ENDOSCOPY;  Surgeon: Kinsinger, Herlene Righter, MD;  Location: [facility];  Service: General;  Laterality: N/A;   PH IMPEDANCE  STUDY N/A 03/15/2020   Procedure: PH IMPEDANCE STUDY;  Surgeon: Dianna Specking, MD;  Location: [facility];  Service: Endoscopy;  Laterality: N/A;   SHOULDER SURGERY Bilateral    TONSILLECTOMY      Social History:  reports that she has never smoked. She has never used smokeless tobacco. She reports that she does not drink alcohol and does not use drugs.  Allergies  Allergen Reactions   Beef Allergy  Anaphylaxis   Bovine (Beef) Protein-Containing Drug Products Anaphylaxis   Codeine Anxiety and Hives   Iodine Anaphylaxis   Iodine-131 Anaphylaxis   Ivp Dye [Iodinated Contrast Media] Anaphylaxis and Other (See Comments)   Latex Rash   Morphine And Codeine Other (See Comments)    Tremors, increased heart rate, excitement, confusion per pt   Penicillins Hives        Sertraline Other (See Comments)    Numbness in mouth and hyperactivity   Shellfish Allergy  Hives   Solu-Medrol  [Methylprednisolone ] Other (See Comments)    Numbness and tingling in mouth   Sulfa Antibiotics Hives and Rash   Sulfasalazine Hives and Rash   Barbiturates Other (See Comments)    Excitement    Bee Pollen Hives   Bee Venom Hives   Bioflavonoid Products     Other reaction(s): Other (See Comments) Nuts, strawberries, bicarbonate of soda, Nuts, strawberries, bicarbonate of soda,   Diovan [Valsartan] Nausea Only   Gabapentin Other (See Comments)    Visual disturbance   Other Palpitations, Other (See Comments) and Rash    NARCOTIC ANALGESICS-TREMORS, INCREASED HEART RATE Hmg-Coa  Reductase Inhibitors - intolerance unknown    Oxycodone Swelling, Rash and Cough   Pitavastatin  Other (See Comments) and Swelling    Facial swelling  Other Reaction(s): angioedema, angioedema, angioedema   Promethazine-Dm Other (See Comments)    Felt funny   Tape Rash and Other (See Comments)    Red blotches    Wellness Essentials Blood Sugr [Nutritional Supplements] Other (See Comments)    Nuts, strawberries, bicarbonate  of soda,   Albuterol  Other (See Comments)    Caused wheezing   Ciprofloxacin  Other (See Comments)   Crestor  [Rosuvastatin ] Other (See Comments)   Diltiazem Hcl     Other reaction(s): foot sores   Ezetimibe      Other reaction(s): myalgias   Famotidine  Other (See Comments)    Unknown reaction   Oxycodone Hcl     Other reaction(s): hives, rash   Pred Forte [Prednisolone Acetate] Other (See Comments)    Patient is seeing odd color and images while using this   Welchol [Colesevelam]     Other reaction(s): myalgias   Celebrex [Celecoxib] Swelling and Other (See Comments)    Swelling and numbness (mouth)   Lanolin Itching   Mobic [Meloxicam] Swelling and Other (See Comments)    Swelling and numbness in mouth   Nickel Rash   Nitrates, Organic Rash and Other (See Comments)    Chest tightness also   Soy Allergy  (Obsolete) Rash   Strawberry Extract Rash    Family History  Problem Relation Age of Onset   Cancer Father    Allergic rhinitis Neg Hx    Angioedema Neg Hx    Asthma Neg Hx    Atopy Neg Hx    Eczema Neg Hx    Immunodeficiency Neg Hx    Urticaria Neg Hx      Prior to Admission medications   Medication Sig Start Date End Date Taking? Authorizing Provider  amLODipine  (NORVASC ) 5 MG tablet Take 1 tablet (5 mg total) by mouth daily. 02/02/24  Yes Ladona Heinz, MD  aspirin  EC 81 MG tablet Take 81 mg by mouth every 6 (six) hours as needed for moderate pain (pain score 4-6). 06/27/23  Yes [provider]  cetirizine (ZYRTEC) 10 MG tablet Take 10 mg by mouth daily as needed for allergies.   Yes [provider]  EPINEPHrine  0.3 mg/0.3 mL IJ SOAJ injection Inject 0.3 mg into the muscle as needed for anaphylaxis. 03/22/24  Yes Ladona Heinz, MD  azithromycin  (ZITHROMAX ) 250 MG tablet Take 250 mg by mouth daily. Patient not taking: Reported on 10/26/2024 10/20/24   [provider]  Evolocumab  (REPATHA  SURECLICK) 140 MG/ML SOAJ Inject 140 mg into the skin every 14  (fourteen) days. Patient not taking: Reported on 10/26/2024 03/18/24   Ladona Heinz, MD  triamcinolone ointment (KENALOG) 0.1 % Apply 1 Application topically 2 (two) times daily as needed (Irritation). Patient not taking: Reported on 10/26/2024    [provider]    Physical Exam: BP 106/61 (BP Location: Left Arm)   Pulse 69   Temp 97.7 F (36.5 C) (Oral)   Resp 19   SpO2 100%   General:  Alert, oriented, calm, in no acute distress  Eyes: EOMI, clear conjuctivae, white sclerea Neck: supple, no masses, trachea mildline  Cardiovascular: RRR, no murmurs or rubs, no peripheral edema  Respiratory: clear to auscultation bilaterally, no wheezes, no crackles  Abdomen: soft, nontender, nondistended, normal bowel tones heard  Skin: dry, no rashes  Musculoskeletal: no joint effusions, normal range of  motion  Psychiatric: appropriate affect, normal speech  Neurologic: extraocular muscles intact, clear speech, moving all extremities with intact sensorium         Recent Labs and Imaging Reviewed:  Basic Metabolic Panel: Recent Labs  Lab 10/26/24 0655  NA 142  K 3.5  CL 105  CO2 26  GLUCOSE 108*  BUN 8  CREATININE 0.76  CALCIUM  9.6   Liver Function Tests: No results for input(s): AST, ALT, ALKPHOS, BILITOT, PROT, ALBUMIN  in the last 168 hours. No results for input(s): LIPASE, AMYLASE in the last 168 hours. No results for input(s): AMMONIA in the last 168 hours. CBC: Recent Labs  Lab 10/26/24 0655  WBC 2.4*  NEUTROABS 1.4*  HGB 11.3*  HCT 38.5  MCV 74.6*  PLT 160   Cardiac Enzymes: No results for input(s): CKTOTAL, CKMB, CKMBINDEX, TROPONINI in the last 168 hours.  BNP (last 3 results) No results for input(s): BNP in the last 8760 hours.  ProBNP (last 3 results) No results for input(s): PROBNP in the last 8760 hours.  CBG: No results for input(s): GLUCAP in the last 168 hours.  Radiological Exams on Admission: DG Chest Portable  1 View Result Date: 10/26/2024 EXAM: 1 VIEW(S) XRAY OF THE CHEST 10/26/2024 06:59:00 AM COMPARISON: Portable chest 01/23/2024. CLINICAL HISTORY: SOB. PT BIB EMS. Per EMS PT was Dx w/ Covid 10 days ago. PT took a z-pac and zythromax. PT called EMS because she had a cough that was keeping her up tonight. FINDINGS: LUNGS AND PLEURA: Lungs are mildly emphysematous. No focal pulmonary opacity. No pulmonary edema. No pleural effusion. No pneumothorax. HEART AND MEDIASTINUM: Calcification of the transverse aorta. The heart is at the upper limits of normal in size. No vascular congestion is seen. BONES AND SOFT TISSUES: Osteopenia and mild degenerative change of the thoracic spine. IMPRESSION: 1. No acute cardiopulmonary findings. 2. Mild emphysema. 3. Cardiomediastinal silhouette at the upper limits of normal size without vascular congestion. 4. Aortic atherosclerosis. Electronically signed by: Francis Quam MD 10/26/2024 07:07 AM EDT RP Workstation: [id number]    Summary and Recommendations: Donna Baldwin is a 83 y.o. female with medical history significant for hypertension, anxiety, GERD, hyper lipidemia diagnosed with COVID about 10 days ago presents due to persistent cough and found to have desaturation to 88% with ambulation.  Patient does not meet criteria for any COVID-specific therapy, she is stable on room air except when ambulating.  There is no other indication for hospital admission.  Recommend discharge home with home oxygen, Tessalon Perles, and albuterol  inhaler.  She has a home O2 meter.  Discussed with the patient, her husband as well as ER provider who are in agreement.  Thank you for involving us  in the care of your patient.  Please contact us  with any questions or concerns.  Audrea Bolte CHRISTELLA Gail MD [facility] Pager [phone]  If 7PM-7AM, please contact night-coverage [email or web address] Password [email or web address]  10/26/2024, 2:44 PM

## 2024-10-26 NOTE — Progress Notes (Cosign Needed)
 SATURATION QUALIFICATIONS: (This note is used to comply with regulatory documentation for home oxygen)  Patient Saturations on Room Air at Rest = 89%  Patient Saturations on Room Air while Ambulating = below 90%  Patient Saturations on 2 Liters of oxygen while Ambulating = 100%  Please briefly explain why patient needs home oxygen: Pt desaturates c/exertion.

## 2024-10-26 NOTE — ED Triage Notes (Signed)
 PT BIB EMS. Per EMS PT was Dx w/ Covid 10 days ago. PT took a z-pac and zythromax. PT called EMS because she had a cough that was keeping her up tonight.

## 2024-10-26 NOTE — ED Notes (Signed)
 Ambulation trial: she was pretty dizzy after a few minutes. but the lowest she dropped was 89%. Pt traveled abt 30 feet

## 2024-10-26 NOTE — ED Provider Notes (Signed)
 Donna Baldwin Provider Note   CSN: [id number] Arrival date & time: 10/26/24  0600     Patient presents with: Shortness of Breath   Donna Baldwin is a 83 y.o. female with a past medical history significant for hypertension, hyperlipidemia, thrombocytopenia, anxiety, and GERD who presents to the ED due to shortness of breath.  Patient states she was diagnosed with COVID-19 on 10/20 with symptom onset on 10/18.  She notes she periodically has coughing spells which causes significant shortness of breath.  Also endorses some chest pain during most recent coughing spell which prompted patient's husband to call 911.  Patient given ASA by EMS prior to arrival.  Patient admits to shortness of breath worse with exertion.  No history of blood clots.  Denies lower extremity edema.  Patient finished azithromycin  prescribed by PCP yesterday.  Also admits to dry mouth.  Denies nausea, vomiting, and diarrhea.  History obtained from patient and past medical records. No interpreter used during encounter.       Prior to Admission medications   Medication Sig Start Date End Date Taking? Authorizing Provider  albuterol  (VENTOLIN  HFA) 108 (90 Base) MCG/ACT inhaler Inhale 1-2 puffs into the lungs every 6 (six) hours as needed for wheezing or shortness of breath. 10/26/24  Yes Angle Karel, Aleck BROCKS, PA-C  amLODipine  (NORVASC ) 5 MG tablet Take 1 tablet (5 mg total) by mouth daily. 02/02/24  Yes Ladona Heinz, MD  aspirin  EC 81 MG tablet Take 81 mg by mouth every 6 (six) hours as needed for moderate pain (pain score 4-6). 06/27/23  Yes [provider]  benzonatate (TESSALON) 100 MG capsule Take 1 capsule (100 mg total) by mouth every 8 (eight) hours. 10/26/24  Yes Froilan Mclean, Aleck BROCKS, PA-C  cetirizine (ZYRTEC) 10 MG tablet Take 10 mg by mouth daily as needed for allergies.   Yes [provider]  EPINEPHrine  0.3 mg/0.3 mL IJ SOAJ injection Inject 0.3 mg  into the muscle as needed for anaphylaxis. 03/22/24  Yes Ladona Heinz, MD  azithromycin  (ZITHROMAX ) 250 MG tablet Take 250 mg by mouth daily. Patient not taking: Reported on 10/26/2024 10/20/24   [provider]  Evolocumab  (REPATHA  SURECLICK) 140 MG/ML SOAJ Inject 140 mg into the skin every 14 (fourteen) days. Patient not taking: Reported on 10/26/2024 03/18/24   Ladona Heinz, MD  triamcinolone ointment (KENALOG) 0.1 % Apply 1 Application topically 2 (two) times daily as needed (Irritation). Patient not taking: Reported on 10/26/2024    [provider]    Allergies: Beef allergy ; Bovine (beef) protein-containing drug products; Codeine; Iodine; Iodine-131; Ivp dye [iodinated contrast media]; Latex; Morphine and codeine; Penicillins; Sertraline; Shellfish allergy ; Solu-medrol  [methylprednisolone ]; Sulfa antibiotics; Sulfasalazine; Barbiturates; Bee pollen; Bee venom; Bioflavonoid products; Diovan [valsartan]; Gabapentin; Other; Oxycodone; Pitavastatin ; Promethazine-dm; Tape; Wellness essentials blood sugr [nutritional supplements]; Albuterol ; Ciprofloxacin ; Crestor  [rosuvastatin ]; Diltiazem hcl; Ezetimibe ; Famotidine ; Oxycodone hcl; Pred forte [prednisolone acetate]; Welchol [colesevelam]; Celebrex [celecoxib]; Lanolin; Mobic [meloxicam]; Nickel; Nitrates, organic; Soy allergy  (obsolete); and Strawberry extract    Review of Systems  Constitutional:  Positive for fatigue. Negative for chills and fever.  Respiratory:  Positive for cough and shortness of breath.   Cardiovascular:  Positive for chest pain. Negative for leg swelling.  Gastrointestinal:  Negative for abdominal pain, diarrhea, nausea and vomiting.    Updated Vital Signs BP 106/61 (BP Location: Left Arm)   Pulse 69   Temp 97.7 F (36.5 C) (Oral)   Resp 19   SpO2 100%  Physical Exam Vitals and nursing note reviewed.  Constitutional:      General: She is not in acute distress.    Appearance: She is not ill-appearing.   HENT:     Head: Normocephalic.  Eyes:     Pupils: Pupils are equal, round, and reactive to light.  Cardiovascular:     Rate and Rhythm: Normal rate and regular rhythm.     Pulses: Normal pulses.     Heart sounds: Normal heart sounds. No murmur heard.    No friction rub. No gallop.  Pulmonary:     Effort: Pulmonary effort is normal.     Breath sounds: Normal breath sounds.  Abdominal:     General: Abdomen is flat. There is no distension.     Palpations: Abdomen is soft.     Tenderness: There is no abdominal tenderness. There is no guarding or rebound.  Musculoskeletal:        General: Normal range of motion.     Cervical back: Neck supple.  Skin:    General: Skin is warm and dry.  Neurological:     General: No focal deficit present.     Mental Status: She is alert.  Psychiatric:        Mood and Affect: Mood normal.        Behavior: Behavior normal.     (all labs ordered are listed, but only abnormal results are displayed) Labs Reviewed  CBC WITH DIFFERENTIAL/PLATELET - Abnormal; Notable for the following components:      Result Value   WBC 2.4 (*)    RBC 5.16 (*)    Hemoglobin 11.3 (*)    MCV 74.6 (*)    MCH 21.9 (*)    MCHC 29.4 (*)    Neutro Abs 1.4 (*)    All other components within normal limits  BASIC METABOLIC PANEL WITH GFR - Abnormal; Notable for the following components:   Glucose, Bld 108 (*)    All other components within normal limits  PRO BRAIN NATRIURETIC PEPTIDE  TROPONIN T, HIGH SENSITIVITY  TROPONIN T, HIGH SENSITIVITY    EKG: EKG Interpretation Date/Time:  Tuesday October 26 2024 06:47:05 EDT Ventricular Rate:  72 PR Interval:  147 QRS Duration:  83 QT Interval:  430 QTC Calculation: 471 R Axis:   58  Text Interpretation: Sinus rhythm Confirmed by Elnor Savant ([id number]) on 10/26/2024 7:29:19 AM  Radiology: DG Chest Portable 1 View Result Date: 10/26/2024 EXAM: 1 VIEW(S) XRAY OF THE CHEST 10/26/2024 06:59:00 AM COMPARISON: Portable chest  01/23/2024. CLINICAL HISTORY: SOB. PT BIB EMS. Per EMS PT was Dx w/ Covid 10 days ago. PT took a z-pac and zythromax. PT called EMS because she had a cough that was keeping her up tonight. FINDINGS: LUNGS AND PLEURA: Lungs are mildly emphysematous. No focal pulmonary opacity. No pulmonary edema. No pleural effusion. No pneumothorax. HEART AND MEDIASTINUM: Calcification of the transverse aorta. The heart is at the upper limits of normal in size. No vascular congestion is seen. BONES AND SOFT TISSUES: Osteopenia and mild degenerative change of the thoracic spine. IMPRESSION: 1. No acute cardiopulmonary findings. 2. Mild emphysema. 3. Cardiomediastinal silhouette at the upper limits of normal size without vascular congestion. 4. Aortic atherosclerosis. Electronically signed by: Francis Quam MD 10/26/2024 07:07 AM EDT RP Workstation: [id number]     Procedures   Medications Ordered in the ED  benzonatate (TESSALON) capsule 100 mg (100 mg Oral Given 10/26/24 0739)  amLODipine  (NORVASC ) tablet 5 mg (5 mg Oral Given  10/26/24 0739)  ipratropium-albuterol  (DUONEB) 0.5-2.5 (3) MG/3ML nebulizer solution 3 mL (3 mLs Nebulization Given 10/26/24 0937)    Clinical Course as of 10/26/24 1505  Tue Oct 26, 2024  0744 BP(!): 158/69 BP improved upon recheck 152/69 [CA]  0942 Reassessed patient at bedside.  Patient is unsure about her albuterol  allergy .  Patient willing to try breathing treatment and is aware that albuterol  is in breathing treatment.  Patient notes she is unable to take any steroids. [CA]  1030 Reassessed patient at bedside.  Patient still with shortness of breath.  Given when patient ambulated her O2 saturation got below 90 will discuss with hospitalist for admission. [CA]  1114 Discussed with Dr. Roxane with [facility] who will evaluated patient. Possibly able to discharge home if able to secure oxygen machine for home. TOC consult placed. Will discuss with pharmacy about current COVID treatment  recommendations.  [CA]  1118 Discussed with pharmacy who does not feel antivirals are needed at this time given patient is 8-10 days out from symptom onset. All symptomatic treatment.  [CA]    Clinical Course User Index [CA] Lorelle Aleck BROCKS, PA-C                                 Medical Decision Making Amount and/or Complexity of Data Reviewed External Data Reviewed: notes. Labs: ordered. Decision-making details documented in ED Course. Radiology: ordered and independent interpretation performed. Decision-making details documented in ED Course. ECG/medicine tests: ordered and independent interpretation performed. Decision-making details documented in ED Course.  Risk Prescription drug management. Decision regarding hospitalization.   This patient presents to the ED for concern of SOB, this involves an extensive number of treatment options, and is a complaint that carries with it a high risk of complications and morbidity.  The differential diagnosis includes viral process, ACS, PE, PNA, etc  83 year old female presents to the ED due to shortness of breath.  Diagnosed with COVID-19 on 10/20.  Had a coughing spell earlier today which resulted in significant shortness of breath which prompted patient to call 911.  Recently finished azithromycin  yesterday.  Upon arrival, stable vitals.  O2 saturation 100% on room air.  Upon recheck of BP during initial evaluation BP elevated 182/80.  Patient notes she has not taken her BP medication yesterday or today which is amlodipine  5 mg.  Denies headache or visual changes.  Amlodipine  given.  Routine labs ordered.  Troponin to rule out ACS.  Suspect symptoms are likely related to COVID-19.  Lower suspicion for PE/DVT.  CBC with leukopenia 2.4.  Anemia with hemoglobin 11.3.  Normal platelets.  BMP reassuring.  Normal renal function.  No major electrolyte derangements.  Troponin normal.  EKG normal sinus rhythm.  No signs of acute ischemia.  Low suspicion  for ACS.  X-ray personally reviewed and interpreted which is negative for any acute abnormalities.  Does demonstrate mild emphysema and cardiomediastinal silhouette upper limits of normal.  No evidence of vascular congestion.  No lower extremity edema.   Patient ambulated 30 feet and her oxygen dropped to 89%.  DuoNeb treatment given.  Patient allergic to steroids, so will hold at this time. Given desaturation, patient will likely require admission. Discussed with Dr. Elnor who agrees with assessment and plan.   Discussed with Dr. Rosezena with [facility] who evaluated patient at bedside and feels patient can go home with home oxygen and symptomatic treatment (if unable to get home oxygen can  be admitted). Discussed with TOC who have ordered home oxygen.   Co morbidities that complicate the patient evaluation  HTN, HL Cardiac Monitoring: / EKG:  The patient was maintained on a cardiac monitor.  I personally viewed and interpreted the cardiac monitored which showed an underlying rhythm of: NSR  Social Determinants of Health:  Elderly >65  Test / Admission - Considered:  Considered admission; however, patient well appearing on exam and only very minimally symptomatic. O2 saturation normal at rest (100% on room air). O2 saturation dips below 90 with ambulation. Will get home oxygen for patient after discussed with the hospitalist.    Patient handed off to [facility], PA-C at shift change pending home oxygen.     Final diagnoses:  COVID-19 virus infection  Shortness of breath    ED Discharge Orders          Ordered    albuterol  (VENTOLIN  HFA) 108 (90 Base) MCG/ACT inhaler  Every 6 hours PRN        10/26/24 1501    benzonatate (TESSALON) 100 MG capsule  Every 8 hours        10/26/24 1501               Lorelle Aleck BROCKS, PA-C 10/26/24 1512    Elnor Savant A, DO 10/28/24 [phone]

## 2024-10-26 NOTE — ED Notes (Signed)
Ambulated pt to bathroom and back to bed.

## 2024-10-26 NOTE — Discharge Instructions (Signed)
 It was a pleasure taking care of you today.  As discussed, your workup was reassuring.  I am sending you home with albuterol  and cough medication.  Take as needed.  You have also been prescribed home oxygen as needed for shortness of breath.  Please follow-up with PCP this week for recheck.  Return to the ER for any worsening symptoms.

## 2024-10-26 NOTE — ED Notes (Signed)
 Pt requesting discharge vs admit,  EDP notified

## 2024-10-26 NOTE — ED Notes (Signed)
 Called social work and was preferred to call case management for home O2

## 2024-10-27 DIAGNOSIS — R0902 Hypoxemia: Secondary | ICD-10-CM | POA: Diagnosis not present

## 2024-10-27 MED ORDER — SALINE SPRAY 0.65 % NA SOLN
1.0000 | NASAL | Status: DC | PRN
  Administered 2024-10-27: 1 via NASAL
  Filled 2024-10-27: qty 44

## 2024-10-27 MED ORDER — TRIAMCINOLONE ACETONIDE 0.1 % EX OINT: 1.0000 | TOPICAL_OINTMENT | Freq: Two times a day (BID) | CUTANEOUS | Status: DC | PRN

## 2024-10-27 MED ORDER — ASPIRIN 81 MG PO TBEC: 81.0000 mg | DELAYED_RELEASE_TABLET | Freq: Four times a day (QID) | ORAL | Status: DC | PRN

## 2024-10-27 MED ORDER — AMLODIPINE BESYLATE 5 MG PO TABS
5.0000 mg | ORAL_TABLET | Freq: Every day | ORAL | Status: DC
  Administered 2024-10-27 – 2024-10-28 (×2): 5 mg via ORAL
  Filled 2024-10-27 (×2): qty 1

## 2024-10-27 MED ORDER — LORATADINE 10 MG PO TABS
10.0000 mg | ORAL_TABLET | Freq: Once | ORAL | Status: AC
  Administered 2024-10-27: 10 mg via ORAL
  Filled 2024-10-27: qty 1

## 2024-10-27 NOTE — Plan of Care (Signed)

## 2024-10-27 NOTE — Plan of Care (Signed)
  Problem: Education: Goal: Knowledge of General Education information will improve Description: Including pain rating scale, medication(s)/side effects and non-pharmacologic comfort measures Outcome: Progressing   Problem: Pain Managment: Goal: General experience of comfort will improve and/or be controlled Outcome: Progressing   Problem: Safety: Goal: Ability to remain free from injury will improve Outcome: Progressing   Problem: Clinical Measurements: Goal: Ability to maintain clinical measurements within normal limits will improve Outcome: Progressing

## 2024-10-27 NOTE — Progress Notes (Signed)
 PROGRESS NOTE    Donna Baldwin  FMW:[id number] DOB: 01-01-1941 DOA: 10/26/2024 PCP: Teresa Channel, MD   Brief Narrative:  Donna Baldwin is a 83 y.o. female with medical history significant for hypertension, anxiety, GERD, hyperlipidemia diagnosed with COVID about 10 days prior to presentation here with worsening persistent cough and noted desaturations at home.  Assessment & Plan: Principal Problem:   Hypoxia  Acute hypoxic respiratory failure secondary to recent COVID infection - COVID-positive/infection 10 days prior to admission with worsening cough and desaturations - Noted profound hypoxia with exertion today, sats reportedly in the low 80s with exertion, patient markedly symptomatic. - Chest x-ray with diffuse infiltrates but no notable regional opacity. - Patient is well outside the window for treatment of COVID at this time, continue precautions in the interim per hospital protocol - Continue to monitor, wean oxygen as appropriate, repeat oxygen screen pending - Continue supportive care, nebs, oxygen, flutter, incentive spirometry  Hypertension Anxiety GERD Hyperlipidemia - Stable, resume home medications  DVT prophylaxis: enoxaparin  (LOVENOX ) injection 30 mg Start: 10/26/24 2200 Code Status:   Code Status: Prior Family Communication: At bedside  Status is: Observation  Dispo: The patient is from: Home              Anticipated d/c is to: Home              Anticipated d/c date is: 24 to 48 hours              Patient currently not medically stable for discharge  Consultants:  None  Procedures:  None  Antimicrobials:  None indicated  Subjective: No acute issues or events overnight, patient continues to be markedly hypoxic with exertion with moderate symptoms.  Otherwise denies nausea vomit diarrhea constipation high fevers chills or chest pain  Objective: Vitals:   10/27/24 0916 10/27/24 0926 10/27/24 1126 10/27/24 1257  BP: 118/66 133/85   121/72  Pulse: 63 91  (!) 59  Resp: 16 18  16   Temp: 97.9 F (36.6 C) (!) 97.5 F (36.4 C)  97.7 F (36.5 C)  TempSrc: Oral Oral  Oral  SpO2:  99% (!) 81%   Weight:      Height:       No intake or output data in the 24 hours ending 10/27/24 1536 Filed Weights   10/26/24 1947  Weight: 44 kg    Examination:  General:  Pleasantly resting in bed, No acute distress. HEENT:  Normocephalic atraumatic.  Sclerae nonicteric, noninjected.  Extraocular movements intact bilaterally. Neck:  Without mass or deformity.  Trachea is midline. Lungs:  Clear to auscultate bilaterally without rhonchi, wheeze, or rales. Heart:  Regular rate and rhythm.  Without murmurs, rubs, or gallops. Abdomen:  Soft, nontender, nondistended.  Without guarding or rebound. Extremities: Without cyanosis, clubbing, edema, or obvious deformity. Skin:  Warm and dry, no erythema.  Data Reviewed: I have personally reviewed following labs and imaging studies  CBC: Recent Labs  Lab 10/26/24 0655  WBC 2.4*  NEUTROABS 1.4*  HGB 11.3*  HCT 38.5  MCV 74.6*  PLT 160   Basic Metabolic Panel: Recent Labs  Lab 10/26/24 0655  NA 142  K 3.5  CL 105  CO2 26  GLUCOSE 108*  BUN 8  CREATININE 0.76  CALCIUM  9.6   GFR: Estimated Creatinine Clearance: 37 mL/min (by C-G formula based on SCr of 0.76 mg/dL).  BNP (last 3 results) Recent Labs    10/26/24 2053  PROBNP 223.0   No  results found for this or any previous visit (from the past 240 hours).   Radiology Studies: DG Chest Portable 1 View Result Date: 10/26/2024 EXAM: 1 VIEW(S) XRAY OF THE CHEST 10/26/2024 06:59:00 AM COMPARISON: Portable chest 01/23/2024. CLINICAL HISTORY: SOB. PT BIB EMS. Per EMS PT was Dx w/ Covid 10 days ago. PT took a z-pac and zythromax. PT called EMS because she had a cough that was keeping her up tonight. FINDINGS: LUNGS AND PLEURA: Lungs are mildly emphysematous. No focal pulmonary opacity. No pulmonary edema. No pleural effusion. No  pneumothorax. HEART AND MEDIASTINUM: Calcification of the transverse aorta. The heart is at the upper limits of normal in size. No vascular congestion is seen. BONES AND SOFT TISSUES: Osteopenia and mild degenerative change of the thoracic spine. IMPRESSION: 1. No acute cardiopulmonary findings. 2. Mild emphysema. 3. Cardiomediastinal silhouette at the upper limits of normal size without vascular congestion. 4. Aortic atherosclerosis. Electronically signed by: Francis Quam MD 10/26/2024 07:07 AM EDT RP Workstation: [id number]   Scheduled Meds:  enoxaparin  (LOVENOX ) injection  30 mg Subcutaneous Q24H   Continuous Infusions:   LOS: 0 days   Time spent:  Elsie JAYSON Montclair, DO [facility]  If 7PM-7AM, please contact night-coverage [email or web address]  10/27/2024, 3:36 PM

## 2024-10-27 NOTE — Plan of Care (Signed)
 SATURATION QUALIFICATIONS: (This note is used to comply with regulatory documentation for home oxygen)  Patient Saturations on Room Air at Rest = 98%  Patient Saturations on Room Air while Ambulating = 81%  Patient Saturations on 1 Liters of oxygen while Ambulating = 98%  Please briefly explain why patient needs home oxygen:

## 2024-10-27 NOTE — TOC Progression Note (Addendum)
 Transition of Care [facility]) - Progression Note    Patient Details  Name: PATRYCJA MUMPOWER MRN: [id number] Date of Birth: 1941/11/12  [facility] CM/SW Contact  Doneta Glenys DASEN, RN Phone Number: 10/27/2024, 9:27 AM  Clinical Narrative:    CM had to confirm attending for patient. Almarie Overland, [place] was list as attending. CM pages Dr. Jillian since he was list as attending in Amion. Dr. Jillian informed me that Dr. Lue is coving. CM update AC.  Per DME agency patient does not have a qualifying diagnosis for home oxygen. Dr. Lue updated.  Expected Discharge Plan and Services                                               Social Drivers of Health (SDOH) Interventions SDOH Screenings   Food Insecurity: No Food Insecurity (10/26/2024)  Housing: Low Risk  (10/26/2024)  Transportation Needs: No Transportation Needs (10/26/2024)  Utilities: Not At Risk (10/26/2024)  Social Connections: Socially Integrated (10/26/2024)  Tobacco Use: Low Risk  (10/26/2024)    Readmission Risk Interventions     No data to display

## 2024-10-28 DIAGNOSIS — R0902 Hypoxemia: Secondary | ICD-10-CM | POA: Diagnosis not present

## 2024-10-28 MED ORDER — BENZONATATE 100 MG PO CAPS: 100.0000 mg | ORAL_CAPSULE | Freq: Three times a day (TID) | ORAL | 0 refills | Status: DC | PRN

## 2024-10-28 MED ORDER — IPRATROPIUM-ALBUTEROL 0.5-2.5 (3) MG/3ML IN SOLN: 3.0000 mL | Freq: Four times a day (QID) | RESPIRATORY_TRACT | 0 refills | Status: AC | PRN

## 2024-10-28 NOTE — Discharge Summary (Signed)
 Physician Discharge Summary  Donna Baldwin FMW:[id number] DOB: 1941-10-24 DOA: 10/26/2024  PCP: Teresa Channel, MD  Admit date: 10/26/2024 Discharge date: 10/28/2024  Admitted From: Home Disposition: Home  Recommendations for Outpatient Follow-up:  Follow up with PCP in 1-2 weeks  Home Health: None Equipment/Devices: Nebulizer  Discharge Condition: Stable CODE STATUS: Full Diet recommendation: Low-salt low-fat diet  Brief/Interim Summary: Donna Baldwin is a 83 y.o. female with medical history significant for hypertension, anxiety, GERD, hyperlipidemia diagnosed with COVID about 10 days prior to presentation here with worsening persistent cough and noted desaturations at home.  Patient admitted with acute hypoxic respiratory failure secondary to COVID infection.  Patient fortunately over the worst of her symptoms, diagnosed with COVID 10 days prior to admission and while acute illness had resolved patient had ongoing respiratory symptoms and pulmonary edema.  Initially patient requiring oxygen with exertion and at rest, over the subsequent 48 hours patient symptoms improved drastically.  We discussed patient will not need oxygen at baseline at discharge, she continued to request oxygen though as it made her feel better.  Patient also improved with nebulizers here and while no formal diagnosis of COPD or asthma patient reports marked improvement on albuterol  **of note patient reports allergy  to albuterol  but this is secondary to the handheld inhaler due to inhaled powder irritation**.  As such we will send home with a limited course of albuterol  -can be extended if felt reasonable by PCP or pulmonology.  It also would be helpful to have formal PFTs given questionable respiratory history.  She is otherwise at this time stable and agreeable for discharge home, daughter at bedside agrees.  Follow-up outpatient with PCP as above, potential plan to follow-up pulmonology if no resolving  symptoms in the next 2 to 4 weeks    Discharge Diagnoses:  Principal Problem:   Hypoxia  Acute hypoxic respiratory failure secondary to recent COVID infection - Resolved- COVID-positive/infection 10 days prior to admission with worsening cough and desaturations - Chest x-ray with diffuse infiltrates but no notable regional opacity. - Patient is well outside the window for treatment of COVID at this time, continue precautions in the interim per hospital protocol - No longer requiring oxygen, discharged with nebulizer as above   Hypertension Anxiety GERD Hyperlipidemia - Stable, resume home medications   Discharge Instructions  Discharge Instructions     Call MD for:  difficulty breathing, headache or visual disturbances   Complete by: As directed    Call MD for:  extreme fatigue   Complete by: As directed    Call MD for:  hives   Complete by: As directed    Call MD for:  persistant dizziness or light-headedness   Complete by: As directed    Call MD for:  persistant nausea and vomiting   Complete by: As directed    Call MD for:  severe uncontrolled pain   Complete by: As directed    Call MD for:  temperature >100.4   Complete by: As directed    Diet - low sodium heart healthy   Complete by: As directed    For home use only DME Nebulizer machine   Complete by: As directed    Patient needs a nebulizer to treat with the following condition: COVID-19   Length of Need: 6 Months   Additional equipment included:  Administration kit Filter     Increase activity slowly   Complete by: As directed    No wound care   Complete by: As  directed       Allergies as of 10/28/2024       Reactions   Beef Allergy  Anaphylaxis   Bovine (beef) Protein-containing Drug Products Anaphylaxis   Codeine Anxiety, Hives   Iodine Anaphylaxis   Iodine-131 Anaphylaxis   Ivp Dye [iodinated Contrast Media] Anaphylaxis, Other (See Comments)   Latex Rash   Morphine And Codeine Other (See  Comments)   Tremors, increased heart rate, excitement, confusion per pt   Penicillins Hives      Sertraline Other (See Comments)   Numbness in mouth and hyperactivity   Shellfish Allergy  Hives   Solu-medrol  [methylprednisolone ] Other (See Comments)   Numbness and tingling in mouth   Sulfa Antibiotics Hives, Rash   Sulfasalazine Hives, Rash   Barbiturates Other (See Comments)   Excitement   Bee Pollen Hives   Bee Venom Hives   Bioflavonoid Products    Other reaction(s): Other (See Comments) Nuts, strawberries, bicarbonate of soda, Nuts, strawberries, bicarbonate of soda,   Diovan [valsartan] Nausea Only   Gabapentin Other (See Comments)   Visual disturbance   Other Palpitations, Other (See Comments), Rash   NARCOTIC ANALGESICS-TREMORS, INCREASED HEART RATE Hmg-Coa Reductase Inhibitors - intolerance unknown   Oxycodone Swelling, Rash, Cough   Pitavastatin  Other (See Comments), Swelling   Facial swelling Other Reaction(s): angioedema, angioedema, angioedema   Promethazine-dm Other (See Comments)   Felt funny   Tape Rash, Other (See Comments)   Red blotches   Wellness Essentials Blood Sugr [nutritional Supplements] Other (See Comments)   Nuts, strawberries, bicarbonate of soda,   Albuterol  Other (See Comments)   Caused wheezing   Ciprofloxacin  Other (See Comments)   Crestor  [rosuvastatin ] Other (See Comments)   Diltiazem Hcl    Other reaction(s): foot sores   Ezetimibe     Other reaction(s): myalgias   Famotidine  Other (See Comments)   Unknown reaction   Oxycodone Hcl    Other reaction(s): hives, rash   Pred Forte [prednisolone Acetate] Other (See Comments)   Patient is seeing odd color and images while using this   Welchol [colesevelam]    Other reaction(s): myalgias   Celebrex [celecoxib] Swelling, Other (See Comments)   Swelling and numbness (mouth)   Lanolin Itching   Mobic [meloxicam] Swelling, Other (See Comments)   Swelling and numbness in mouth   Nickel  Rash   Nitrates, Organic Rash, Other (See Comments)   Chest tightness also   Soy Allergy  (obsolete) Rash   Strawberry Extract Rash        Medication List     STOP taking these medications    triamcinolone ointment 0.1 % Commonly known as: KENALOG       TAKE these medications    amLODipine  5 MG tablet Commonly known as: NORVASC  Take 1 tablet (5 mg total) by mouth daily.   aspirin  EC 81 MG tablet Take 81 mg by mouth every 6 (six) hours as needed for moderate pain (pain score 4-6).   benzonatate 100 MG capsule Commonly known as: Tessalon Perles Take 1 capsule (100 mg total) by mouth 3 (three) times daily as needed for cough.   cetirizine 10 MG tablet Commonly known as: ZYRTEC Take 10 mg by mouth daily as needed for allergies.   EPINEPHrine  0.3 mg/0.3 mL Soaj injection Commonly known as: EPI-PEN Inject 0.3 mg into the muscle as needed for anaphylaxis.   ipratropium-albuterol  0.5-2.5 (3) MG/3ML Soln Commonly known as: DUONEB Take 3 mLs by nebulization every 6 (six) hours as needed (wheezing and shortness of  breath.).               Durable Medical Equipment  (From admission, onward)           Start     Ordered   10/28/24 0000  For home use only DME Nebulizer machine       Question Answer Comment  Patient needs a nebulizer to treat with the following condition COVID-19   Length of Need 6 Months   Additional equipment included Administration kit   Additional equipment included Filter      10/28/24 1121   10/26/24 1422  For home use only DME oxygen  Once       Question Answer Comment  Length of Need 6 Months   Mode or (Route) Nasal cannula   Liters per Minute 2   Frequency Continuous (stationary and portable oxygen unit needed)   Oxygen delivery system: Gas   Oxygen delivery system: Other (comment)   Oxygen delivery system: Portable concentrator (POC)   Other: need portable o2. o2 saturation at 88% with ambulation      10/26/24 1422             Allergies  Allergen Reactions   Beef Allergy  Anaphylaxis   Bovine (Beef) Protein-Containing Drug Products Anaphylaxis   Codeine Anxiety and Hives   Iodine Anaphylaxis   Iodine-131 Anaphylaxis   Ivp Dye [Iodinated Contrast Media] Anaphylaxis and Other (See Comments)   Latex Rash   Morphine And Codeine Other (See Comments)    Tremors, increased heart rate, excitement, confusion per pt   Penicillins Hives        Sertraline Other (See Comments)    Numbness in mouth and hyperactivity   Shellfish Allergy  Hives   Solu-Medrol  [Methylprednisolone ] Other (See Comments)    Numbness and tingling in mouth   Sulfa Antibiotics Hives and Rash   Sulfasalazine Hives and Rash   Barbiturates Other (See Comments)    Excitement    Bee Pollen Hives   Bee Venom Hives   Bioflavonoid Products     Other reaction(s): Other (See Comments) Nuts, strawberries, bicarbonate of soda, Nuts, strawberries, bicarbonate of soda,   Diovan [Valsartan] Nausea Only   Gabapentin Other (See Comments)    Visual disturbance   Other Palpitations, Other (See Comments) and Rash    NARCOTIC ANALGESICS-TREMORS, INCREASED HEART RATE Hmg-Coa Reductase Inhibitors - intolerance unknown    Oxycodone Swelling, Rash and Cough   Pitavastatin  Other (See Comments) and Swelling    Facial swelling  Other Reaction(s): angioedema, angioedema, angioedema   Promethazine-Dm Other (See Comments)    Felt funny   Tape Rash and Other (See Comments)    Red blotches    Wellness Essentials Blood Sugr [Nutritional Supplements] Other (See Comments)    Nuts, strawberries, bicarbonate of soda,   Albuterol  Other (See Comments)    Caused wheezing   Ciprofloxacin  Other (See Comments)   Crestor  [Rosuvastatin ] Other (See Comments)   Diltiazem Hcl     Other reaction(s): foot sores   Ezetimibe      Other reaction(s): myalgias   Famotidine  Other (See Comments)    Unknown reaction   Oxycodone Hcl     Other reaction(s): hives, rash    Pred Forte [Prednisolone Acetate] Other (See Comments)    Patient is seeing odd color and images while using this   Welchol [Colesevelam]     Other reaction(s): myalgias   Celebrex [Celecoxib] Swelling and Other (See Comments)    Swelling and numbness (mouth)   Lanolin  Itching   Mobic [Meloxicam] Swelling and Other (See Comments)    Swelling and numbness in mouth   Nickel Rash   Nitrates, Organic Rash and Other (See Comments)    Chest tightness also   Soy Allergy  (Obsolete) Rash   Strawberry Extract Rash    Consultations: None  Procedures/Studies: DG Chest Portable 1 View Result Date: 10/26/2024 EXAM: 1 VIEW(S) XRAY OF THE CHEST 10/26/2024 06:59:00 AM COMPARISON: Portable chest 01/23/2024. CLINICAL HISTORY: SOB. PT BIB EMS. Per EMS PT was Dx w/ Covid 10 days ago. PT took a z-pac and zythromax. PT called EMS because she had a cough that was keeping her up tonight. FINDINGS: LUNGS AND PLEURA: Lungs are mildly emphysematous. No focal pulmonary opacity. No pulmonary edema. No pleural effusion. No pneumothorax. HEART AND MEDIASTINUM: Calcification of the transverse aorta. The heart is at the upper limits of normal in size. No vascular congestion is seen. BONES AND SOFT TISSUES: Osteopenia and mild degenerative change of the thoracic spine. IMPRESSION: 1. No acute cardiopulmonary findings. 2. Mild emphysema. 3. Cardiomediastinal silhouette at the upper limits of normal size without vascular congestion. 4. Aortic atherosclerosis. Electronically signed by: Francis Quam MD 10/26/2024 07:07 AM EDT RP Workstation: [id number]   CT BONE MARROW BIOPSY & ASPIRATION Result Date: 10/15/2024 INDICATION: Pancytopenia rule out MDS EXAM: CT GUIDED BONE MARROW ASPIRATION AND CORE BIOPSY MEDICATIONS: None. ANESTHESIA/SEDATION: Moderate (conscious) sedation was employed during this procedure. A total of Versed  2 mg and Fentanyl  50 mcg was administered intravenously. Moderate Sedation Time: 13 minutes. The  patient's level of consciousness and vital signs were monitored continuously by radiology nursing throughout the procedure under my direct supervision. FLUOROSCOPY TIME:  CT dose; 73 mGycm COMPLICATIONS: None immediate. Estimated blood loss: <5 mL PROCEDURE: RADIATION DOSE REDUCTION: This exam was performed according to the departmental dose-optimization program which includes automated exposure control, adjustment of the mA and/or kV according to patient size and/or use of iterative reconstruction technique. Informed written consent was obtained from the patient after a thorough discussion of the procedural risks, benefits and alternatives. All questions were addressed. Maximal Sterile Barrier Technique was utilized including caps, mask, sterile gowns, sterile gloves, sterile drape, hand hygiene and skin antiseptic. A timeout was performed prior to the initiation of the procedure. The patient was positioned prone and non-contrast localization CT was performed of the pelvis to demonstrate the iliac marrow spaces. Under sterile conditions and local anesthesia, an 11 gauge coaxial bone biopsy needle was advanced into the RIGHT iliac marrow space. Needle position was confirmed with CT imaging. Initially, bone marrow aspiration was performed. Next, the 11 gauge outer cannula was utilized to obtain a single iliac bone marrow core biopsy. Needle was removed. Hemostasis was obtained with compression. The patient tolerated the procedure well. Samples were prepared with the cytotechnologist. IMPRESSION: Successful CT-guided bone marrow aspiration and biopsy. Thom Hall, MD [facility] Electronically Signed   By: Thom Hall M.D.   On: 10/15/2024 13:21     Subjective: No acute issues or events overnight denies nausea vomiting diarrhea constipation headache fevers chills chest pain shortness of breath   Discharge Exam: Vitals:   10/28/24 0544 10/28/24 1101   BP: 117/82   Pulse: 78   Resp: 18   Temp: 97.9 F (36.6 C)   SpO2:  100%   Vitals:   10/27/24 1257 10/27/24 2024 10/28/24 0544 10/28/24 1101  BP: 121/72 131/61 117/82   Pulse: (!) 59 (!) 47 78   Resp: 16  18 18   Temp: 97.7 F (36.5 C) 97.8 F (36.6 C) 97.9 F (36.6 C)   TempSrc: Oral Oral    SpO2:    100%  Weight:      Height:        General: Pt is alert, awake, not in acute distress Cardiovascular: RRR, S1/S2 +, no rubs, no gallops Respiratory: CTA bilaterally, no wheezing, no rhonchi Abdominal: Soft, NT, ND, bowel sounds + Extremities: no edema, no cyanosis    The results of significant diagnostics from this hospitalization (including imaging, microbiology, ancillary and laboratory) are listed below for reference.     Microbiology: No results found for this or any previous visit (from the past 240 hours).   Labs: BNP (last 3 results) No results for input(s): BNP in the last 8760 hours. Basic Metabolic Panel: Recent Labs  Lab 10/26/24 0655  NA 142  K 3.5  CL 105  CO2 26  GLUCOSE 108*  BUN 8  CREATININE 0.76  CALCIUM  9.6   Liver Function Tests: No results for input(s): AST, ALT, ALKPHOS, BILITOT, PROT, ALBUMIN  in the last 168 hours. No results for input(s): LIPASE, AMYLASE in the last 168 hours. No results for input(s): AMMONIA in the last 168 hours. CBC: Recent Labs  Lab 10/26/24 0655  WBC 2.4*  NEUTROABS 1.4*  HGB 11.3*  HCT 38.5  MCV 74.6*  PLT 160   Cardiac Enzymes: No results for input(s): CKTOTAL, CKMB, CKMBINDEX, TROPONINI in the last 168 hours. BNP: Invalid input(s): POCBNP CBG: No results for input(s): GLUCAP in the last 168 hours. D-Dimer No results for input(s): DDIMER in the last 72 hours. Hgb A1c No results for input(s): HGBA1C in the last 72 hours. Lipid Profile No results for input(s): CHOL, HDL, LDLCALC, TRIG, CHOLHDL, LDLDIRECT in the last 72 hours. Thyroid  function  studies No results for input(s): TSH, T4TOTAL, T3FREE, THYROIDAB in the last 72 hours.  Invalid input(s): FREET3 Anemia work up No results for input(s): VITAMINB12, FOLATE, FERRITIN, TIBC, IRON, RETICCTPCT in the last 72 hours. Urinalysis    Component Value Date/Time   COLORURINE YELLOW 04/03/2017 1059   APPEARANCEUR CLEAR 04/03/2017 1059   LABSPEC 1.005 04/03/2017 1059   PHURINE 6.0 04/03/2017 1059   GLUCOSEU NEGATIVE 04/03/2017 1059   HGBUR NEGATIVE 04/03/2017 1059   BILIRUBINUR NEGATIVE 04/03/2017 1059   KETONESUR NEGATIVE 04/03/2017 1059   PROTEINUR NEGATIVE 04/03/2017 1059   NITRITE NEGATIVE 04/03/2017 1059   LEUKOCYTESUR NEGATIVE 04/03/2017 1059   Sepsis Labs Recent Labs  Lab 10/26/24 0655  WBC 2.4*   Microbiology No results found for this or any previous visit (from the past 240 hours).   Time coordinating discharge: Over 30 minutes  SIGNED:   Elsie JAYSON [place], DO [facility] 10/28/2024, 11:21 AM Pager   If 7PM-7AM, please contact night-coverage [email or web address]

## 2024-10-28 NOTE — Care Management Obs Status (Signed)
 MEDICARE OBSERVATION STATUS NOTIFICATION   Patient Details  Name: Donna Baldwin MRN: [id number] Date of Birth: August 11, 1941   Medicare Observation Status Notification Given:  Yes    Doneta Glenys DASEN, RN 10/28/2024, 9:21 AM

## 2024-10-28 NOTE — Plan of Care (Signed)

## 2024-10-28 NOTE — TOC Progression Note (Signed)
 Transition of Care [facility]) - Progression Note    Patient Details  Name: Donna Baldwin MRN: [id number] Date of Birth: 09-25-1941  [facility] of [facility]) CM/SW Contact  Doneta Glenys DASEN, RN Phone Number: 10/28/2024, 10:24 AM  Clinical Narrative:    MOON completed.  Expected Discharge Plan and Services                                               Social Drivers of Health (SDOH) Interventions SDOH Screenings   Food Insecurity: No Food Insecurity (10/26/2024)  Housing: Low Risk  (10/26/2024)  Transportation Needs: No Transportation Needs (10/26/2024)  Utilities: Not At Risk (10/26/2024)  Social Connections: Socially Integrated (10/26/2024)  Tobacco Use: Low Risk  (10/26/2024)    Readmission Risk Interventions     No data to display

## 2024-10-28 NOTE — TOC Initial Note (Signed)
 Transition of Care [facility]) - Initial/Assessment Note    Patient Details  Name: Donna Baldwin MRN: [id number] Date of Birth: 1941-05-29  Transition of Care [facility]) CM/SW Contact:    Doneta Glenys DASEN, RN Phone Number: 10/28/2024, 12:05 PM  Clinical Narrative:                 Presented for [facility]. PTA lives with spouse in a house. Denies DME, HH,oxygen, and SDOH needs. Patient daughter will transport at discharge.  No needs.  Expected Discharge Plan: Home/Self Care Barriers to Discharge: Barriers Resolved   Patient Goals and CMS Choice Patient states their goals for this hospitalization and ongoing recovery are:: Home CMS Medicare.gov Compare Post Acute Care list provided to::  (NA) Choice offered to / list presented to : NA [place] ownership interest in [facility].provided to:: Parent NA    Expected Discharge Plan and Services   Discharge Planning Services: CM Consult Post Acute Care Choice: NA Living arrangements for the past 2 months: Single Family Home Expected Discharge Date: 10/28/24               DME Arranged: N/A DME Agency: NA       HH Arranged: NA HH Agency: NA        Prior Living Arrangements/Services Living arrangements for the past 2 months: Single Family Home Lives with:: Self Patient language and need for interpreter reviewed:: Yes        Need for Family Participation in Patient Care: No (Comment) Care giver support system in place?: Yes (comment)   Criminal Activity/Legal Involvement Pertinent to Current Situation/Hospitalization: No - Comment as needed  Activities of Daily Living   ADL Screening (condition at time of admission) Independently performs ADLs?: Yes (appropriate for developmental age) Is the patient deaf or have difficulty hearing?: No Does the patient have difficulty seeing, even when wearing glasses/contacts?: No Does the patient have difficulty concentrating, remembering, or making decisions?: No  Permission  Sought/Granted Permission sought to share information with : Case Manager Permission granted to share information with : Yes, Verbal Permission Granted              Emotional Assessment Appearance:: Appears stated age Attitude/Demeanor/Rapport: Engaged Affect (typically observed): Appropriate Orientation: : Oriented to Self, Oriented to Place, Oriented to  Time, Oriented to Situation Alcohol / Substance Use: Not Applicable Psych Involvement: No (comment)  Admission diagnosis:  Shortness of breath [R06.02] Hypoxia [R09.02] COVID-19 virus infection [U07.1] Patient Active Problem List   Diagnosis Date Noted   Hypoxia 10/26/2024   Elevated d-dimer 01/22/2024   Diastolic dysfunction 01/22/2024   PVD (peripheral vascular disease) 01/22/2024   Leukopenia 01/22/2024   Anxiety 01/22/2024   Weight loss 01/22/2024   Dysphagia 04/25/2023   Chest pain 08/06/2022   Bradycardia 08/05/2022   Circadian rhythm sleep disorder, advanced sleep phase type 08/07/2021   Bradycardia with 31-40 beats per minute 08/07/2021   Primary hypertension 08/07/2021   Snoring 08/07/2021   Excessive daytime sleepiness 08/07/2021   Palpitations 08/07/2021   Microcytic anemia 04/11/2021   Achalasia 04/18/2020   Abnormal CT of liver 12/20/2019   Coronary artery disease involving native coronary artery of native heart without angina pectoris 12/20/2019   Palpitation 02/08/2019   Interstitial cystitis 08/25/2018   Nonintractable headache    TIA (transient ischemic attack) 07/06/2018   History of diabetes mellitus 04/01/2017   Transient retinal artery occlusion of both eyes 07/07/2014   Hereditary and idiopathic peripheral neuropathy 07/07/2014   Aortic atherosclerosis  12/03/2013   Mixed hyperlipidemia 12/03/2013   GERD (gastroesophageal reflux disease) 03/24/2013   Obstructive sleep apnea, ruled out 02/28/2013   Urticaria due to food allergy  08/31/2012   Thrombocytopenia    PCP:  Teresa Channel,  MD Pharmacy:   [id number] - [place], [place] - [address] [place] [address] Phone: [phone] Fax: [phone]  [facility] ([facility]) - [address] [place], [place] - [address] [place] [address] Phone: [phone] Fax: [phone]     Social Drivers of Health (SDOH) Social History: SDOH Screenings   Food Insecurity: No Food Insecurity (10/26/2024)  Housing: Low Risk  (10/26/2024)  Transportation Needs: No Transportation Needs (10/26/2024)  Utilities: Not At Risk (10/26/2024)  Social Connections: Socially Integrated (10/26/2024)  Tobacco Use: Low Risk  (10/26/2024)   SDOH Interventions:     Readmission Risk Interventions     No data to display

## 2024-10-28 NOTE — Plan of Care (Signed)
   Problem: Education: Goal: Knowledge of General Education information will improve Description: Including pain rating scale, medication(s)/side effects and non-pharmacologic comfort measures Outcome: Progressing   Problem: Clinical Measurements: Goal: Will remain free from infection Outcome: Progressing   Problem: Clinical Measurements: Goal: Diagnostic test results will improve Outcome: Progressing

## 2024-10-28 NOTE — Plan of Care (Cosign Needed)
SATURATION QUALIFICATIONS: (This note is used to comply with regulatory documentation for home oxygen)  Patient Saturations on Room Air at Rest = 100%  Patient Saturations on Room Air while Ambulating = 100%  Patient Saturations on 0 Liters of oxygen while Ambulating = 100%  Please briefly explain why patient needs home oxygen: 

## 2024-10-29 DIAGNOSIS — I253 Aneurysm of heart: Secondary | ICD-10-CM | POA: Diagnosis not present

## 2024-10-29 DIAGNOSIS — E1169 Type 2 diabetes mellitus with other specified complication: Secondary | ICD-10-CM | POA: Diagnosis not present

## 2024-10-29 DIAGNOSIS — I10 Essential (primary) hypertension: Secondary | ICD-10-CM | POA: Diagnosis not present

## 2024-10-29 DIAGNOSIS — M810 Age-related osteoporosis without current pathological fracture: Secondary | ICD-10-CM | POA: Diagnosis not present

## 2024-11-02 ENCOUNTER — Telehealth: Payer: Self-pay | Admitting: *Deleted

## 2024-11-02 ENCOUNTER — Telehealth: Payer: Self-pay | Admitting: Physician Assistant

## 2024-11-02 NOTE — Telephone Encounter (Signed)
 I spoke with [name], PA to confirm ok to leave appt's as scheduled. Called pt. and spoke with her and informed her of lab appt. and MD appt. 12/02/24 at 1500/1530. Pt. states she understands and dates and time work for her.

## 2024-11-02 NOTE — Telephone Encounter (Signed)
 I moved this patient off Dr Blas schedule to fit in treatment patients that need to see Dr Sherrod on 12/4. I called the patient and let her know she will still have the same day just doing her labs and visit later in the day. She said I was talking over her and told me she will speak with someone else. If she does not like the later time she can have it moved to a different day.

## 2024-11-03 DIAGNOSIS — Z8616 Personal history of COVID-19: Secondary | ICD-10-CM | POA: Diagnosis not present

## 2024-11-03 DIAGNOSIS — Z8709 Personal history of other diseases of the respiratory system: Secondary | ICD-10-CM | POA: Diagnosis not present

## 2024-11-03 DIAGNOSIS — D72819 Decreased white blood cell count, unspecified: Secondary | ICD-10-CM | POA: Diagnosis not present

## 2024-11-03 DIAGNOSIS — D509 Iron deficiency anemia, unspecified: Secondary | ICD-10-CM | POA: Diagnosis not present

## 2024-11-03 DIAGNOSIS — E441 Mild protein-calorie malnutrition: Secondary | ICD-10-CM | POA: Diagnosis not present

## 2024-11-28 DIAGNOSIS — I10 Essential (primary) hypertension: Secondary | ICD-10-CM | POA: Diagnosis not present

## 2024-11-28 DIAGNOSIS — M810 Age-related osteoporosis without current pathological fracture: Secondary | ICD-10-CM | POA: Diagnosis not present

## 2024-11-28 DIAGNOSIS — I253 Aneurysm of heart: Secondary | ICD-10-CM | POA: Diagnosis not present

## 2024-11-28 DIAGNOSIS — E1169 Type 2 diabetes mellitus with other specified complication: Secondary | ICD-10-CM | POA: Diagnosis not present

## 2024-11-29 DIAGNOSIS — E785 Hyperlipidemia, unspecified: Secondary | ICD-10-CM | POA: Diagnosis not present

## 2024-11-29 DIAGNOSIS — Z713 Dietary counseling and surveillance: Secondary | ICD-10-CM | POA: Diagnosis not present

## 2024-11-29 DIAGNOSIS — I10 Essential (primary) hypertension: Secondary | ICD-10-CM | POA: Diagnosis not present

## 2024-11-29 DIAGNOSIS — E1169 Type 2 diabetes mellitus with other specified complication: Secondary | ICD-10-CM | POA: Diagnosis not present

## 2024-11-29 NOTE — Progress Notes (Unsigned)
 [facility] OFFICE PROGRESS NOTE  Donna Channel, MD [phone] [address] [place] [address]  DIAGNOSIS:  1) Thrombocytopenia likely ITP. 2) microcytic anemia of unclear etiology.   PRIOR THERAPY: None  CURRENT THERAPY: Multivitamins with vitamin B12 supplements.   INTERVAL HISTORY: Donna Baldwin 83 y.o. female returns to the clinic today for a follow up visit. The patient was last seen in the clinic for a follow up visit in September.  Patient is followed for ITP and microcytic anemia.  She takes a multivitamin and B12 supplement.  Since last being seen she denies any major changes in her health except she was hospitalized for COVID-19.  She is feeling ***at this time.  Dr. Sherrod arrange for bone marrow biopsy and aspirate to rule out a bone marrow pathology.  She tolerated this procedure ***  The patient denies any abnormal bleeding or bruising.  She denies any epistaxis, gingival bleeding, hemoptysis, hematemesis, melena, or hematochezia.  Her energy is ***.  Cold intolerance?  She denies any shortness of breath.  Patient continues to take her B12 supplement and multivitamin.  She is here today for evaluation and repeat blood work.  MEDICAL HISTORY: Past Medical History:  Diagnosis Date   Acid reflux    Anemia    Anxiety    Back pain    GERD (gastroesophageal reflux disease)    Hyperlipidemia    Hypertension    Hypertensive heart disease without heart failure    Thrombocytopenia    Vitamin D  deficiency     ALLERGIES:  is allergic to beef allergy ; bovine (beef) protein-containing drug products; codeine; iodine; iodine-131; ivp dye [iodinated contrast media]; latex; morphine and codeine; penicillins; sertraline; shellfish allergy ; solu-medrol  [methylprednisolone ]; sulfa antibiotics; sulfasalazine; barbiturates; bee pollen; bee venom; bioflavonoid products; diovan [valsartan]; gabapentin; other; oxycodone; pitavastatin ; promethazine-dm; tape;  wellness essentials blood sugr [nutritional supplements]; albuterol ; ciprofloxacin ; crestor  [rosuvastatin ]; diltiazem hcl; ezetimibe ; famotidine ; oxycodone hcl; pred forte [prednisolone acetate]; welchol [colesevelam]; celebrex [celecoxib]; lanolin; mobic [meloxicam]; nickel; nitrates, organic; soy allergy  (obsolete); and strawberry extract.  MEDICATIONS:  Current Outpatient Medications  Medication Sig Dispense Refill   amLODipine  (NORVASC ) 5 MG tablet Take 1 tablet (5 mg total) by mouth daily. 90 tablet 3   aspirin  EC 81 MG tablet Take 81 mg by mouth every 6 (six) hours as needed for moderate pain (pain score 4-6).     benzonatate  (TESSALON  PERLES) 100 MG capsule Take 1 capsule (100 mg total) by mouth 3 (three) times daily as needed for cough. 30 capsule 0   cetirizine (ZYRTEC) 10 MG tablet Take 10 mg by mouth daily as needed for allergies.     EPINEPHrine  0.3 mg/0.3 mL IJ SOAJ injection Inject 0.3 mg into the muscle as needed for anaphylaxis. 1 each 1   ipratropium-albuterol  (DUONEB) 0.5-2.5 (3) MG/3ML SOLN Take 3 mLs by nebulization every 6 (six) hours as needed (wheezing and shortness of breath.). 360 mL 0   No current facility-administered medications for this visit.    SURGICAL HISTORY:  Past Surgical History:  Procedure Laterality Date   ESOPHAGEAL MANOMETRY N/A 03/15/2020   Procedure: ESOPHAGEAL MANOMETRY (EM);  Surgeon: Dianna Specking, MD;  Location: [facility];  Service: Endoscopy;  Laterality: N/A;   EYE SURGERY     HELLER MYOTOMY N/A 04/18/2020   Procedure: LAPAROSCOPIC HELLER MYOTOMY WITH UPPER ENDOSCOPY;  Surgeon: Kinsinger, Herlene Righter, MD;  Location: [facility];  Service: General;  Laterality: N/A;   PH IMPEDANCE STUDY N/A 03/15/2020   Procedure:  PH IMPEDANCE STUDY;  Surgeon: Dianna Specking, MD;  Location: [facility];  Service: Endoscopy;  Laterality: N/A;   SHOULDER SURGERY Bilateral    TONSILLECTOMY      REVIEW OF SYSTEMS:   Review of Systems  Constitutional:  Negative for appetite change, chills, fatigue, fever and unexpected weight change.  HENT:   Negative for mouth sores, nosebleeds, sore throat and trouble swallowing.   Eyes: Negative for eye problems and icterus.  Respiratory: Negative for cough, hemoptysis, shortness of breath and wheezing.   Cardiovascular: Negative for chest pain and leg swelling.  Gastrointestinal: Negative for abdominal pain, constipation, diarrhea, nausea and vomiting.  Genitourinary: Negative for bladder incontinence, difficulty urinating, dysuria, frequency and hematuria.   Musculoskeletal: Negative for back pain, gait problem, neck pain and neck stiffness.  Skin: Negative for itching and rash.  Neurological: Negative for dizziness, extremity weakness, gait problem, headaches, light-headedness and seizures.  Hematological: Negative for adenopathy. Does not bruise/bleed easily.  Psychiatric/Behavioral: Negative for confusion, depression and sleep disturbance. The patient is not nervous/anxious.     PHYSICAL EXAMINATION:  There were no vitals taken for this visit.  ECOG PERFORMANCE STATUS: {CHL ONC ECOG [id number]  Physical Exam  Constitutional: Oriented to person, place, and time and well-developed, well-nourished, and in no distress. No distress.  HENT:  Head: Normocephalic and atraumatic.  Mouth/Throat: Oropharynx is clear and moist. No oropharyngeal exudate.  Eyes: Conjunctivae are normal. Right eye exhibits no discharge. Left eye exhibits no discharge. No scleral icterus.  Neck: Normal range of motion. Neck supple.  Cardiovascular: Normal rate, regular rhythm, normal heart sounds and intact distal pulses.   Pulmonary/Chest: Effort normal and breath sounds normal. No respiratory distress. No wheezes. No rales.  Abdominal: Soft. Bowel sounds are normal. Exhibits no distension and no mass. There is no tenderness.  Musculoskeletal: Normal range of motion. Exhibits no edema.  Lymphadenopathy:    No cervical  adenopathy.  Neurological: Alert and oriented to person, place, and time. Exhibits normal muscle tone. Gait normal. Coordination normal.  Skin: Skin is warm and dry. No rash noted. Not diaphoretic. No erythema. No pallor.  Psychiatric: Mood, memory and judgment normal.  Vitals reviewed.  LABORATORY DATA: Lab Results  Component Value Date   WBC 2.4 (L) 10/26/2024   HGB 11.3 (L) 10/26/2024   HCT 38.5 10/26/2024   MCV 74.6 (L) 10/26/2024   PLT 160 10/26/2024      Chemistry      Component Value Date/Time   NA 142 10/26/2024 0655   NA 143 10/08/2023 1123   K 3.5 10/26/2024 0655   CL 105 10/26/2024 0655   CO2 26 10/26/2024 0655   BUN 8 10/26/2024 0655   BUN 10 10/08/2023 1123   CREATININE 0.76 10/26/2024 0655   CREATININE 0.82 09/07/2024 1051   CREATININE 0.87 04/03/2017 1059      Component Value Date/Time   CALCIUM  9.6 10/26/2024 0655   ALKPHOS 113 09/07/2024 1051   AST 23 09/07/2024 1051   ALT 12 09/07/2024 1051   BILITOT 0.5 09/07/2024 1051       RADIOGRAPHIC STUDIES:  No results found.   ASSESSMENT/PLAN:  This is a very pleasant 83 year old African-American female with mild thrombocytopenia felt to be related to ITP and mild microcytic anemia.  She is currently on B12 orally.  The patient had a repeat CBC, B12, CMP, and ***  She recently had a bone marrow biopsy and aspirate.  The patient was seen with Dr. Sherrod today.  Dr. Sherrod personally independently  reviewed her bone marrow biopsy results.  Her results showed ***  Her labs today show ***.   Recommend that she ***.   I will call her with the results of her *** studies.   She will continue taking her ***  We will see her back for labs and a follow-up visit in***  The patient was advised to call immediately if she has any concerning symptoms in the interval. The patient voices understanding of current disease status and treatment options and is in agreement with the current care plan. All questions  were answered. The patient knows to call the clinic with any problems, questions or concerns. We can certainly see the patient much sooner if necessary        No orders of the defined types were placed in this encounter.    I spent {CHL ONC TIME VISIT - DTPQU:[id number]} counseling the patient face to face. The total time spent in the appointment was {CHL ONC TIME VISIT - DTPQU:[id number]}.  Donna Baldwin L Aniesa Boback, PA-C 11/29/24

## 2024-12-02 ENCOUNTER — Inpatient Hospital Stay

## 2024-12-02 ENCOUNTER — Inpatient Hospital Stay: Admitting: Physician Assistant

## 2024-12-02 ENCOUNTER — Inpatient Hospital Stay: Admitting: Internal Medicine

## 2024-12-02 ENCOUNTER — Inpatient Hospital Stay: Attending: Internal Medicine

## 2024-12-02 VITALS — BP 150/69 | HR 80 | Temp 98.0°F | Resp 14 | Wt 103.0 lb

## 2024-12-02 DIAGNOSIS — D649 Anemia, unspecified: Secondary | ICD-10-CM | POA: Insufficient documentation

## 2024-12-02 DIAGNOSIS — D509 Iron deficiency anemia, unspecified: Secondary | ICD-10-CM | POA: Diagnosis not present

## 2024-12-02 DIAGNOSIS — D696 Thrombocytopenia, unspecified: Secondary | ICD-10-CM

## 2024-12-02 LAB — CMP (CANCER CENTER ONLY)
ALT: 16 U/L (ref 0–44)
AST: 29 U/L (ref 15–41)
Albumin: 4.7 g/dL (ref 3.5–5.0)
Alkaline Phosphatase: 123 U/L (ref 38–126)
Anion gap: 10 (ref 5–15)
BUN: 9 mg/dL (ref 8–23)
CO2: 28 mmol/L (ref 22–32)
Calcium: 9.7 mg/dL (ref 8.9–10.3)
Chloride: 105 mmol/L (ref 98–111)
Creatinine: 0.76 mg/dL (ref 0.44–1.00)
GFR, Estimated: >60 mL/min (ref >60)
Glucose, Bld: 103 mg/dL — ABNORMAL HIGH (ref 70–99)
Potassium: 4 mmol/L (ref 3.5–5.1)
Sodium: 143 mmol/L (ref 135–145)
Total Bilirubin: 0.3 mg/dL (ref 0.0–1.2)
Total Protein: 8.2 g/dL — ABNORMAL HIGH (ref 6.5–8.1)

## 2024-12-02 LAB — CBC WITH DIFFERENTIAL (CANCER CENTER ONLY)
Abs Immature Granulocytes: 0 K/uL (ref 0.00–0.07)
Basophils Absolute: 0 K/uL (ref 0.0–0.1)
Basophils Relative: 1 %
Eosinophils Absolute: 0 K/uL (ref 0.0–0.5)
Eosinophils Relative: 1 %
HCT: 38 % (ref 36.0–46.0)
Hemoglobin: 11.8 g/dL — ABNORMAL LOW (ref 12.0–15.0)
Immature Granulocytes: 0 %
Lymphocytes Relative: 34 %
Lymphs Abs: 1.5 K/uL (ref 0.7–4.0)
MCH: 22.9 pg — ABNORMAL LOW (ref 26.0–34.0)
MCHC: 31.1 g/dL (ref 30.0–36.0)
MCV: 73.6 fL — ABNORMAL LOW (ref 80.0–100.0)
Monocytes Absolute: 0.4 K/uL (ref 0.1–1.0)
Monocytes Relative: 9 %
Neutro Abs: 2.4 K/uL (ref 1.7–7.7)
Neutrophils Relative %: 55 %
Platelet Count: 168 K/uL (ref 150–400)
RBC: 5.16 MIL/uL — ABNORMAL HIGH (ref 3.87–5.11)
RDW: 16 % — ABNORMAL HIGH (ref 11.5–15.5)
WBC Count: 4.3 K/uL (ref 4.0–10.5)
nRBC: 0 % (ref 0.0–0.2)

## 2024-12-02 LAB — FOLATE: Folate: 14.7 ng/mL (ref >5.9)

## 2024-12-02 LAB — FERRITIN: Ferritin: 134 ng/mL (ref 11–307)

## 2024-12-02 LAB — IRON AND IRON BINDING CAPACITY (CC-WL,HP ONLY)
Iron: 59 ug/dL (ref 28–170)
Saturation Ratios: 16 % (ref 10.4–31.8)
TIBC: 371 ug/dL (ref 250–450)
UIBC: 312 ug/dL

## 2024-12-02 LAB — VITAMIN B12: Vitamin B-12: 1146 pg/mL — ABNORMAL HIGH (ref 180–914)

## 2024-12-03 ENCOUNTER — Telehealth: Payer: Self-pay | Admitting: Physician Assistant

## 2024-12-03 ENCOUNTER — Encounter: Payer: Self-pay | Admitting: Physician Assistant

## 2024-12-03 NOTE — Telephone Encounter (Signed)
 Attempted to call the patient to give her the results of her labs from yesterday. Unable to reach her. Will send mychart message. Left our callback number should she have questions.

## 2024-12-14 ENCOUNTER — Encounter: Payer: Self-pay | Admitting: Podiatry

## 2024-12-14 ENCOUNTER — Ambulatory Visit: Admitting: Podiatrist

## 2024-12-14 ENCOUNTER — Ambulatory Visit: Admitting: Podiatry

## 2024-12-14 DIAGNOSIS — E119 Type 2 diabetes mellitus without complications: Secondary | ICD-10-CM | POA: Diagnosis not present

## 2024-12-14 DIAGNOSIS — M2042 Other hammer toe(s) (acquired), left foot: Secondary | ICD-10-CM

## 2024-12-14 DIAGNOSIS — M79674 Pain in right toe(s): Secondary | ICD-10-CM | POA: Diagnosis not present

## 2024-12-14 DIAGNOSIS — Z0189 Encounter for other specified special examinations: Secondary | ICD-10-CM | POA: Diagnosis not present

## 2024-12-14 DIAGNOSIS — M2041 Other hammer toe(s) (acquired), right foot: Secondary | ICD-10-CM

## 2024-12-14 DIAGNOSIS — M2141 Flat foot [pes planus] (acquired), right foot: Secondary | ICD-10-CM

## 2024-12-14 DIAGNOSIS — M2142 Flat foot [pes planus] (acquired), left foot: Secondary | ICD-10-CM

## 2024-12-14 DIAGNOSIS — M79675 Pain in left toe(s): Secondary | ICD-10-CM

## 2024-12-14 DIAGNOSIS — B351 Tinea unguium: Secondary | ICD-10-CM

## 2024-12-14 DIAGNOSIS — G609 Hereditary and idiopathic neuropathy, unspecified: Secondary | ICD-10-CM

## 2024-12-14 NOTE — Progress Notes (Signed)
 DIABETIC SHOES CASTING:  Patient presented for foam casting for 3 pr custom diabetic shoe inserts-  Patient is a size 9 MED.   Diabetic shoes are chosen from the [employer] catalog.  The shoes chosen are Apex V854.  HOLD on these before paperwork will be filed and shoes ordered.  She doesn't love our selection of shoes and I advised her to check out [employer] supply in [place].  She will let us  know if she would like to proceed with the diabetic shoes and inserts or if she is going to use [facility].   Joaquin Knebel, DPM

## 2024-12-19 NOTE — Progress Notes (Signed)
 "  Subjective:  Patient ID: Donna Baldwin, female    DOB: September 05, 1941,  MRN: [id number]  Donna Baldwin presents to clinic today for at risk foot care with history of diabetic neuropathy and painful mycotic toenails of both feet that are difficult to trim. Pain interferes with daily activities and wearing enclosed shoe gear comfortably. Patient brought in Freedom Foot Cream which she says helps with her dryness of skin. Relates she has no side effects using it. Chief Complaint  Patient presents with   Diabetes    [facility] Diet control diaberes. A1C ? CBC 95. Toenail trim. Pt having numbness at top of foot. LOV with PCP 09/28/24.   New problem(s): None.   PCP is Teresa Channel, MD.  Allergies[1]  Review of Systems: Negative except as noted in the HPI.  Objective: No changes noted in today's physical examination. There were no vitals filed for this visit. Donna Baldwin is a pleasant 83 y.o. female WD, WN in NAD. AAO x 3.   Diabetic foot exam was performed with the following findings:   Vascular Examination: Capillary refill time immediate b/l. Palpable pedal pulses. Pedal hair present b/l. Pedal edema absent. No pain with calf compression b/l. Skin temperature gradient WNL b/l. No cyanosis or clubbing b/l. No ischemia or gangrene noted b/l.   Neurological Examination: Sensation grossly intact b/l with 10 gram monofilament. Vibratory sensation intact b/l. Pt has subjective symptoms of neuropathy.  Dermatological Examination: Pedal skin with normal turgor, texture and tone b/l.  No open wounds. No interdigital macerations.   Toenails 1-5 b/l thick, discolored, elongated with subungual debris and pain on dorsal palpation.   No corns, calluses, nor porokeratotic lesions.  Musculoskeletal Examination: Normal muscle strength 5/5 to all lower extremity muscle groups bilaterally. No pain, crepitus or joint limitation noted with ROM b/l LE. HAV with bunion bilaterally and  hammertoes 2-5 b/l.Donna Baldwin Patient ambulates with cane assistance.  Radiographs: None    Assessment/Plan: 1. Pain due to onychomycosis of toenails of both feet   2. Idiopathic peripheral neuropathy   3. Acquired hammertoes of both feet   4. Pes planus of both feet   5. Controlled type 2 diabetes mellitus without complication, without long-term current use of insulin (HCC)   6. Encounter for diabetic foot exam [facility])   Consent given for treatment. Diabetic foot examination performed today. She may continue using her foot cream for dry skin. All patient's and/or POA's questions/concerns addressed on today's visit. Toenails 1-5 bilaterally debrided in length and girth without incident. Continue foot and shoe inspections daily. Monitor blood glucose per PCP/Endocrinologist's recommendations. Continue soft, supportive shoe gear daily. Report any pedal injuries to medical professional. Call office if there are any questions/concerns.  Return in about 3 months (around 03/14/2025).  Donna Baldwin, DPM      [place] LOCATION: [address] [place] [address]                   Office [phone]   [facility] LOCATION: [address] [place], [place] [address] Office [phone]      [1]  Allergies Allergen Reactions   Beef Allergy  Anaphylaxis   Bovine (Beef) Protein-Containing Drug Products Anaphylaxis  Codeine Anxiety and Hives   Iodine Anaphylaxis   Iodine-131 Anaphylaxis   Ivp Dye [Iodinated Contrast Media] Anaphylaxis and Other (See Comments)   Latex Rash   Morphine And Codeine Other (See Comments)    Tremors, increased heart rate, excitement, confusion per pt   Penicillins Hives        Sertraline Other (See Comments)    Numbness in mouth and hyperactivity   Shellfish Allergy  Hives   Solu-Medrol  [Methylprednisolone ] Other (See Comments)    Numbness and tingling in mouth   Sulfa Antibiotics Hives and Rash    Sulfasalazine Hives and Rash   Barbiturates Other (See Comments)    Excitement    Bee Pollen Hives   Bee Venom Hives   Bioflavonoid Products     Other reaction(s): Other (See Comments) Nuts, strawberries, bicarbonate of soda, Nuts, strawberries, bicarbonate of soda,   Diovan [Valsartan] Nausea Only   Gabapentin Other (See Comments)    Visual disturbance   Other Palpitations, Other (See Comments) and Rash    NARCOTIC ANALGESICS-TREMORS, INCREASED HEART RATE Hmg-Coa Reductase Inhibitors - intolerance unknown    Oxycodone Swelling, Rash and Cough   Pitavastatin  Other (See Comments) and Swelling    Facial swelling  Other Reaction(s): angioedema, angioedema, angioedema   Promethazine-Dm Other (See Comments)    Felt funny   Tape Rash and Other (See Comments)    Red blotches    Wellness Essentials Blood Sugr [Nutritional Supplements] Other (See Comments)    Nuts, strawberries, bicarbonate of soda,   Albuterol  Other (See Comments)    Caused wheezing   Ciprofloxacin  Other (See Comments)   Crestor  [Rosuvastatin ] Other (See Comments)   Diltiazem Hcl     Other reaction(s): foot sores   Ezetimibe      Other reaction(s): myalgias   Famotidine  Other (See Comments)    Unknown reaction   Oxycodone Hcl     Other reaction(s): hives, rash   Pred Forte [Prednisolone Acetate] Other (See Comments)    Patient is seeing odd color and images while using this   Welchol [Colesevelam]     Other reaction(s): myalgias   Celebrex [Celecoxib] Swelling and Other (See Comments)    Swelling and numbness (mouth)   Lanolin Itching   Mobic [Meloxicam] Swelling and Other (See Comments)    Swelling and numbness in mouth   Nickel Rash   Nitrates, Organic Rash and Other (See Comments)    Chest tightness also   Soy Allergy  (Obsolete) Rash   Strawberry Extract Rash   "

## 2024-12-29 ENCOUNTER — Telehealth: Payer: Self-pay | Admitting: Podiatrist

## 2024-12-29 NOTE — Telephone Encounter (Signed)
 The patient reported that she found shoes at [facility] and is seeking clarification on how to proceed. [facility] informed her that they require a referral from our office along with clinical notes specifying the reason for the shoes and the type needed. Please contact the patient to further guide her on the next steps after identifying a shoe she is interested in at [employer].  After reviewing the above message, the patients granddaughter, Catherin, confirmed the information and requested additional wording be added. As the wording continued to change, the call was transferred to your voicemail so she could leave a message with her preferred wording.

## 2025-01-06 ENCOUNTER — Other Ambulatory Visit (INDEPENDENT_AMBULATORY_CARE_PROVIDER_SITE_OTHER): Admitting: Podiatry

## 2025-01-06 DIAGNOSIS — M21612 Bunion of left foot: Secondary | ICD-10-CM

## 2025-01-06 DIAGNOSIS — E119 Type 2 diabetes mellitus without complications: Secondary | ICD-10-CM

## 2025-01-06 DIAGNOSIS — M2141 Flat foot [pes planus] (acquired), right foot: Secondary | ICD-10-CM

## 2025-01-06 DIAGNOSIS — M2012 Hallux valgus (acquired), left foot: Secondary | ICD-10-CM

## 2025-01-06 DIAGNOSIS — G609 Hereditary and idiopathic neuropathy, unspecified: Secondary | ICD-10-CM

## 2025-01-06 DIAGNOSIS — M2041 Other hammer toe(s) (acquired), right foot: Secondary | ICD-10-CM

## 2025-01-06 DIAGNOSIS — M2142 Flat foot [pes planus] (acquired), left foot: Secondary | ICD-10-CM

## 2025-01-06 DIAGNOSIS — M2042 Other hammer toe(s) (acquired), left foot: Secondary | ICD-10-CM

## 2025-01-06 NOTE — Progress Notes (Signed)
 1. Pes planus of both feet   2. Acquired hammertoes of both feet   3. Hallux valgus with bunions, left   4. Idiopathic peripheral neuropathy   5. Controlled type 2 diabetes mellitus without complication, without long-term current use of insulin (HCC)    Orders Placed This Encounter  Procedures   For home use only DME Other see comment    To [facility]: Dispense one pair extra depth shoes and 3 pair heat moldable insoles.    Length of Need:   12 Months

## 2025-01-10 ENCOUNTER — Other Ambulatory Visit: Payer: Self-pay

## 2025-01-10 DIAGNOSIS — I10 Essential (primary) hypertension: Secondary | ICD-10-CM

## 2025-01-10 MED ORDER — AMLODIPINE BESYLATE 5 MG PO TABS: 5.0000 mg | ORAL_TABLET | Freq: Every day | ORAL | 3 refills | Status: AC

## 2025-01-13 ENCOUNTER — Telehealth: Payer: Self-pay | Admitting: Podiatry

## 2025-01-13 NOTE — Telephone Encounter (Signed)
 Patient would like for Dr. Gaynel to contact her regarding orthotics [employer] will pay for custom orthotics, will need certification from provider) Please call patient at [phone]

## 2025-01-25 ENCOUNTER — Telehealth: Payer: Self-pay | Admitting: Cardiology

## 2025-01-25 NOTE — Telephone Encounter (Signed)
 I spoke with patient.  She reports her BP has been elevated recently between 3 and 5 AM.  Last night at 3:45 AM BP was 177/73, heart rate 92. At 3:59 AM BP was 189/88, heart rate 102.  This AM at 10:35 BP was 166/86 and heart rate was 100.  Patient reports at 3:45 she had chills, headache at the base of her neck and was shaking. No fever.  Took one aspirin .  Reports the shaking occurs when her BP is elevated.  Also has some chest pain and feels very tired when heart rate is elevated. States she is under a great deal of stress.  Patient reports during the day her BP is usually around 130/67 and heart rate in mid 50's.  Saw her PCP in December and heart rate was 35.  I scheduled patient to see Dr Ladona on 1/29 at 9 AM.  ED precautions reviewed with patient.

## 2025-01-25 NOTE — Telephone Encounter (Signed)
 STAT if HR is under 50 or over 120 (normal HR is 60-100 beats per minute)  What is your heart rate? 102 - at time of call  Do you have a log of your heart rate readings (document readings)? 102 - last night  Do you have any other symptoms? No  Pt c/o BP issue: STAT if pt c/o blurred vision, one-sided weakness or slurred speech.  STAT if BP is GREATER than 180/120 TODAY.  STAT if BP is LESS than 90/60 and SYMPTOMATIC TODAY  1. What is your BP concern? Elevated BP  2. Have you taken any BP medication today? Yes   3. What are your last 5 BP readings?190/88 - today, 131/71 - last night  4. Are you having any other symptoms (ex. Dizziness, headache, blurred vision, passed out)? SOB at times and chest pressure

## 2025-01-26 ENCOUNTER — Emergency Department (HOSPITAL_COMMUNITY)

## 2025-01-26 ENCOUNTER — Encounter (HOSPITAL_COMMUNITY): Payer: Self-pay | Admitting: Emergency Medicine

## 2025-01-26 ENCOUNTER — Other Ambulatory Visit: Payer: Self-pay

## 2025-01-26 ENCOUNTER — Emergency Department (HOSPITAL_COMMUNITY)
Admission: EM | Admit: 2025-01-26 | Discharge: 2025-01-26 | Disposition: A | Discharge status: Home / Self Care | Attending: Emergency Medicine | Admitting: Emergency Medicine

## 2025-01-26 DIAGNOSIS — R0602 Shortness of breath: Secondary | ICD-10-CM | POA: Diagnosis present

## 2025-01-26 DIAGNOSIS — Z91040 Latex allergy status: Secondary | ICD-10-CM | POA: Insufficient documentation

## 2025-01-26 DIAGNOSIS — R519 Headache, unspecified: Secondary | ICD-10-CM | POA: Insufficient documentation

## 2025-01-26 DIAGNOSIS — R911 Solitary pulmonary nodule: Secondary | ICD-10-CM

## 2025-01-26 DIAGNOSIS — R1084 Generalized abdominal pain: Secondary | ICD-10-CM | POA: Diagnosis not present

## 2025-01-26 DIAGNOSIS — Z7982 Long term (current) use of aspirin: Secondary | ICD-10-CM | POA: Diagnosis not present

## 2025-01-26 LAB — HEPATIC FUNCTION PANEL
ALT: 15 U/L (ref 0–44)
AST: 29 U/L (ref 15–41)
Albumin: 4.6 g/dL (ref 3.5–5.0)
Alkaline Phosphatase: 117 U/L (ref 38–126)
Bilirubin, Direct: 0.1 mg/dL (ref 0.0–0.2)
Indirect Bilirubin: 0.3 mg/dL (ref 0.3–0.9)
Total Bilirubin: 0.4 mg/dL (ref 0.0–1.2)
Total Protein: 7.6 g/dL (ref 6.5–8.1)

## 2025-01-26 LAB — PRO BRAIN NATRIURETIC PEPTIDE: Pro Brain Natriuretic Peptide: 55.5 pg/mL

## 2025-01-26 LAB — URINALYSIS, ROUTINE W REFLEX MICROSCOPIC
Bacteria, UA: NONE SEEN
Bilirubin Urine: NEGATIVE
Glucose, UA: 150 mg/dL — AB
Ketones, ur: NEGATIVE mg/dL
Leukocytes,Ua: NEGATIVE
Nitrite: NEGATIVE
Protein, ur: NEGATIVE mg/dL
Specific Gravity, Urine: 1.001 — ABNORMAL LOW (ref 1.005–1.030)
pH: 7 (ref 5.0–8.0)

## 2025-01-26 LAB — LIPASE, BLOOD: Lipase: 12 U/L (ref 11–51)

## 2025-01-26 LAB — CBC
HCT: 37.5 % (ref 36.0–46.0)
Hemoglobin: 11.9 g/dL — ABNORMAL LOW (ref 12.0–15.0)
MCH: 23.3 pg — ABNORMAL LOW (ref 26.0–34.0)
MCHC: 31.7 g/dL (ref 30.0–36.0)
MCV: 73.5 fL — ABNORMAL LOW (ref 80.0–100.0)
Platelets: 118 10*3/uL — ABNORMAL LOW (ref 150–400)
RBC: 5.1 MIL/uL (ref 3.87–5.11)
RDW: 15.3 % (ref 11.5–15.5)
WBC: 4.4 10*3/uL (ref 4.0–10.5)
nRBC: 0 % (ref 0.0–0.2)

## 2025-01-26 LAB — BASIC METABOLIC PANEL WITH GFR
Anion gap: 13 (ref 5–15)
BUN: 11 mg/dL (ref 8–23)
CO2: 24 mmol/L (ref 22–32)
Calcium: 9.7 mg/dL (ref 8.9–10.3)
Chloride: 103 mmol/L (ref 98–111)
Creatinine, Ser: 0.81 mg/dL (ref 0.44–1.00)
GFR, Estimated: >60 mL/min
Glucose, Bld: 100 mg/dL — ABNORMAL HIGH (ref 70–99)
Potassium: 3.8 mmol/L (ref 3.5–5.1)
Sodium: 140 mmol/L (ref 135–145)

## 2025-01-26 LAB — MAGNESIUM: Magnesium: 2.5 mg/dL — ABNORMAL HIGH (ref 1.7–2.4)

## 2025-01-26 LAB — TROPONIN T, HIGH SENSITIVITY: Troponin T High Sensitivity: 15 ng/L (ref 0–19)

## 2025-01-26 NOTE — ED Provider Notes (Signed)
 " [facility] Provider Note   CSN: [id number] Arrival date & time: 01/26/25  1119     Patient presents with: Shortness of Breath   Donna Baldwin is a 84 y.o. female.   84 yo F with a chief complaints of feeling her heart is racing.  She says for the past 3-4 mornings in a row she will wake up about 3 or 4 AM and feeling she is in a panic feeling she cannot breathe feeling her heart is racing will feel numb from her head to her toes and then eventually will start having some improvement.  She says she usually feels bad until about lunchtime.  She has been taking aspirin  when these events occur but is not sure it helps much.  Develops a headache with it as well.  She also has a strong sensation to move her bowel and bladder.  The rest today she feels mostly fine.  She denies any obvious exertional symptoms.  Denies cough congestion or fever.  Has had ongoing abdominal discomfort.  She does not feeling this been a significant change.  Denies any chest pain.   Shortness of Breath      Prior to Admission medications  Medication Sig Start Date End Date Taking? Authorizing Provider  amLODipine  (NORVASC ) 5 MG tablet Take 1 tablet (5 mg total) by mouth daily. 01/10/25   Darliss Rogue, MD  aspirin  EC 81 MG tablet Take 81 mg by mouth every 6 (six) hours as needed for moderate pain (pain score 4-6). 06/27/23   [provider]  benzonatate  (TESSALON  PERLES) 100 MG capsule Take 1 capsule (100 mg total) by mouth 3 (three) times daily as needed for cough. Patient not taking: Reported on 12/14/2024 10/28/24 10/28/25  Lue Elsie BROCKS, MD  cetirizine (ZYRTEC) 10 MG tablet Take 10 mg by mouth daily as needed for allergies.    [provider]  EPINEPHrine  0.3 mg/0.3 mL IJ SOAJ injection Inject 0.3 mg into the muscle as needed for anaphylaxis. 03/22/24   Ladona Heinz, MD  folic acid  (FOLVITE ) 800 MCG tablet Take 800 mcg by mouth  daily. Patient not taking: Reported on 12/14/2024    [provider]  ipratropium-albuterol  (DUONEB) 0.5-2.5 (3) MG/3ML SOLN Take 3 mLs by nebulization every 6 (six) hours as needed (wheezing and shortness of breath.). 10/28/24 12/14/24  Lue Elsie BROCKS, MD    Allergies: Beef allergy ; Bovine (beef) protein-containing drug products; Codeine; Iodine; Iodine-131; Ivp dye [iodinated contrast media]; Latex; Morphine and codeine; Penicillins; Sertraline; Shellfish allergy ; Solu-medrol  [methylprednisolone ]; Sulfa antibiotics; Sulfasalazine; Barbiturates; Bee pollen; Bee venom; Bioflavonoid products; Diovan [valsartan]; Gabapentin; Other; Oxycodone; Pitavastatin ; Promethazine-dm; Tape; Wellness essentials blood sugr [nutritional supplements]; Albuterol ; Ciprofloxacin ; Crestor  [rosuvastatin ]; Diltiazem  hcl; Ezetimibe ; Famotidine ; Oxycodone hcl; Pred forte [prednisolone acetate]; Welchol [colesevelam]; Celebrex [celecoxib]; Lanolin; Mobic [meloxicam]; Nickel; Nitrates, organic; Soy allergy  (obsolete); and Strawberry extract    Review of Systems  Respiratory:  Positive for shortness of breath.     Updated Vital Signs BP 129/75 (BP Location: Right Arm)   Pulse (!) 108   Temp 97.8 F (36.6 C)   Resp (!) 21   SpO2 100%   Physical Exam Vitals and nursing note reviewed.  Constitutional:      General: She is not in acute distress.    Appearance: She is well-developed. She is not diaphoretic.     Comments: Cachectic  HENT:     Head: Normocephalic and atraumatic.  Eyes:     Pupils:  Pupils are equal, round, and reactive to light.  Cardiovascular:     Rate and Rhythm: Normal rate and regular rhythm.     Heart sounds: No murmur heard.    No friction rub. No gallop.  Pulmonary:     Effort: Pulmonary effort is normal.     Breath sounds: No wheezing or rales.  Abdominal:     General: There is no distension.     Palpations: Abdomen is soft.     Tenderness: There is abdominal tenderness.      Comments: Diffuse abdominal discomfort on exam  Musculoskeletal:        General: No tenderness.     Cervical back: Normal range of motion and neck supple.  Skin:    General: Skin is warm and dry.  Neurological:     Mental Status: She is alert and oriented to person, place, and time.  Psychiatric:        Behavior: Behavior normal.     (all labs ordered are listed, but only abnormal results are displayed) Labs Reviewed  BASIC METABOLIC PANEL WITH GFR - Abnormal; Notable for the following components:      Result Value   Glucose, Bld 100 (*)    All other components within normal limits  CBC - Abnormal; Notable for the following components:   Hemoglobin 11.9 (*)    MCV 73.5 (*)    MCH 23.3 (*)    Platelets 118 (*)    All other components within normal limits  URINALYSIS, ROUTINE W REFLEX MICROSCOPIC - Abnormal; Notable for the following components:   Color, Urine COLORLESS (*)    Specific Gravity, Urine 1.001 (*)    Glucose, UA 150 (*)    Hgb urine dipstick SMALL (*)    All other components within normal limits  MAGNESIUM - Abnormal; Notable for the following components:   Magnesium 2.5 (*)    All other components within normal limits  HEPATIC FUNCTION PANEL  LIPASE, BLOOD  PRO BRAIN NATRIURETIC PEPTIDE  TROPONIN T, HIGH SENSITIVITY    EKG: EKG Interpretation Date/Time:  Wednesday January 26 2025 11:56:22 EST Ventricular Rate:  87 PR Interval:  151 QRS Duration:  78 QT Interval:  392 QTC Calculation: 472 R Axis:   -15  Text Interpretation: Sinus rhythm Multiple premature complexes, vent & supraven Borderline left axis deviation Borderline low voltage, extremity leads Abnormal R-wave progression, early transition Premature ventricular complexes Otherwise no significant change Confirmed by Emil Share [phone]) on 01/26/2025 1:12:17 PM  Radiology: [facility] Chest 2 View Result Date: 01/26/2025 CLINICAL DATA:  Shortness of breath, tachycardia, pain. EXAM: DG CHEST 2V COMPARISON:   10/26/2024. FINDINGS: Trachea is midline. Heart size stable. Thoracic aorta is calcified. Minimal biapical pleural thickening. Lungs are hyperinflated but clear. Degenerative changes in the spine. IMPRESSION: Hyperinflation without acute finding. Electronically Signed   By: Newell Eke M.D.   On: 01/26/2025 12:28     Procedures   Medications Ordered in the ED - No data to display                                  Medical Decision Making Amount and/or Complexity of Data Reviewed Labs: ordered. Radiology: ordered.   84 yo F with a chief complaints of shortness of breath and palpitations.  Seems to occur every morning in the middle the night.  Gets better throughout the day.  Reportedly had a heart rate in the  130s on arrival here though once an EKG was performed her heart rate was in the 80s.  She is feeling a bit better now.  Will obtain a laboratory evaluation here.  X-ray.  Reassess.  Patient care signed out to Dr. Gennaro, please see their note for further details of care in the ED.  The patients results and plan were reviewed and discussed.   Any x-rays performed were independently reviewed by myself.   Differential diagnosis were considered with the presenting HPI.  Medications - No data to display  Vitals:   01/26/25 1129 01/26/25 1200 01/26/25 1527 01/26/25 1541  BP:  136/68 129/75   Pulse: (!) 130 80 (!) 108   Resp:  17 (!) 21   Temp:   98.4 F (36.9 C) 97.8 F (36.6 C)  TempSrc:   Oral   SpO2:  100% 100%     Final diagnoses:  Shortness of breath    Admission/ observation were discussed with the admitting physician, patient and/or family and they are comfortable with the plan.       Final diagnoses:  Shortness of breath    ED Discharge Orders     None          Emil Share, DO 01/26/25 1626  "

## 2025-01-26 NOTE — Progress Notes (Unsigned)
 " Cardiology Office Note:  .   Date:  01/27/2025  ID:  Donna Baldwin Baldwin, DOB Mar 30, 1941, MRN [id number] PCP: Teresa Channel, MD  [facility] Providers Cardiologist:  Gordy Bergamo, MD   History of Present Illness: Donna Baldwin   Donna Baldwin Baldwin is a 84 y.o.  AAF with no significant coronary disease but has mildly elevated coronary calcium  score with noncritical distal left main minimal disease by coronary CTA in 2020, coronary calcium  score in the 50th percentile, difficult to control hypertension, asymptomatic bilateral carotid stenosis, severe small vessel peripheral arterial disease especially involving right lower extremity worse on the left, being followed by vascular surgery, multiple medication allergies and intolerance of statins, achalasia cardia SP surgery in 2019, severe vitamin D  deficiency, chronic thrombocytopenia felt to be ITP asymptomatic.  Last seen in the emergency room on Jan 2026 with palpitations and dyspnea on exertion main complaint being racing heart.  He also complained of dyspnea on exertion.  She has multiple medication intolerances, is extremely anxious about her health and is accompanied by her husband.     Discussed the use of AI scribe software for clinical note transcription with the patient, who gave verbal consent to proceed. History of Present Illness Donna Baldwin Baldwin is an 84 year old female with a history of fluctuating heart rate who presents with episodes of palpitations and anxiety. She is accompanied by her husband.  She describes recurrent episodes of palpitations with her heart skipping beats and then racing, which have become more frequent. Marked episodes on January 6 and January 28 led to emergency room visits. During events her heart rate increases to about 165-170 beats per minute, then drops to the 70s-80s. Episodes occur mainly between 2 and 4 AM, often waking her from sleep, last about ten minutes, and sometimes recur throughout the  day.  The palpitations are associated with significant anxiety, frequent checking of her heart rate, and headaches when her heart is racing and her blood pressure is high. Her heart rate is more stable during the day and more erratic at night. During episodes she takes aspirin  and her blood pressure medication.  Her physical activity is limited but she walks and exercises indoors when possible. She denies coffee, tea, and tobacco use and mainly drinks water. She notes increased anxiety around stressful events.  She has neuropathy with numbness in her feet and a sensation of poor circulation. She uses amlodipine  for blood pressure control and has multiple medication allergies. She has previously worn a heart monitor that showed premature ventricular contractions. She is worried about reported nocturnal bradycardia with heart rates as low as 38-40 beats per minute noted by another physician.  Cardiac Studies relevent.    Extended EKG monitoring life 30 days starting 01/30/2024. Minimum heart rate 33 bpm at 4:29 AM, maximum heart rate 143 beats minute at 10:37 AM with average heartbeat of 59 bpm. There was no atrial fibrillation. Rare ventricular ectopics, burden <1%. Rare supraventricular ectopics, burden <2%. 1 pause, longest 4.2 seconds at 8:11 PM on 02/09/2024.  Patient was asymptomatic and was auto-triggered event. Patient symptoms of rapid heartbeat or palpitations correlated with PVCs and sinus tachycardia/sinus rhythm.      Carotid artery duplex 02/06/2024: Bilateral ICA shows mild heterogenous plaque, left ICA 1-39% stenosis. Antegrade bilateral vertebral artery flow. Normal flow dynamics in bilateral subclavian arteries. Compared to 11/14/2022, left ICA stenosis of 40 to 59% has improved.  ABI 01/23/2024:  Right: Resting right ankle-brachial index indicates moderate right lower extremity  arterial disease. The right toe-brachial index is abnormal.  Left: Resting left ankle-brachial  index indicates moderate left lower extremity arterial disease. The left toe-brachial index is abnormal.    ECHOCARDIOGRAM COMPLETE 01/22/2024  1. Left ventricular ejection fraction, by estimation, is 60 to 65%. The left ventricle has normal function. The left ventricle has no regional wall motion abnormalities. Left ventricular diastolic parameters are consistent with Grade I diastolic dysfunction (impaired relaxation). 2. Right ventricular systolic function is normal. The right ventricular size is normal. There is normal pulmonary artery systolic pressure. The estimated right ventricular systolic pressure is 20.6 mmHg.  Segmental pressure 05/12/2023: Right: Resting ankle brachial index: 0.48  Resting ABI in the moderate range arterial occlusive disease. The segmental exam demonstrates SFA occlusion. Left: Resting ankle brachial index: 0.72 Resting ABI in the mild range of arterial occlusive disease. Segmental exam demonstrates popliteal artery/tibial arterial occlusive disease.   EKG:      Labs   Recent Labs    10/26/24 0655 12/02/24 1453 01/26/25 1319  NA 142 143 140  K 3.5 4.0 3.8  CL 105 105 103  CO2 26 28 24   GLUCOSE 108* 103* 100*  BUN 8 9 11   CREATININE 0.76 0.76 0.81  CALCIUM  9.6 9.7 9.7  GFRNONAA >60 >60 >60    Lab Results  Component Value Date   ALT 15 01/26/2025   AST 29 01/26/2025   ALKPHOS 117 01/26/2025   BILITOT 0.4 01/26/2025      Latest Ref Rng & Units 01/26/2025    1:19 PM 12/02/2024    2:53 PM 10/26/2024    6:55 AM  CBC  WBC 4.0 - 10.5 K/uL 4.4  4.3  2.4   Hemoglobin 12.0 - 15.0 g/dL 88.0  88.1  88.6   Hematocrit 36.0 - 46.0 % 37.5  38.0  38.5   Platelets 150 - 400 K/uL 118  168  160    No results found for: HGBA1C  Lab Results  Component Value Date   TSH 2.118 01/22/2024    Care everywhere/Faxed External Labs:  Lipids 02/26/24:  TC 231, TG 75, HDL 80, LDL 138  ROS  Review of Systems  Cardiovascular:  Positive for palpitations. Negative  for chest pain, dyspnea on exertion, leg swelling, orthopnea and syncope.   Physical Exam:   VS:  BP 122/68 (BP Location: Left Arm, Patient Position: Sitting, Cuff Size: Normal)   Pulse 76   Ht 5' 2.5 (1.588 m)   Wt 105 lb (47.6 kg)   SpO2 98%   BMI 18.90 kg/m    Wt Readings from Last 3 Encounters:  01/27/25 105 lb (47.6 kg)  12/02/24 103 lb (46.7 kg)  10/26/24 97 lb (44 kg)    BP Readings from Last 3 Encounters:  01/27/25 122/68  01/26/25 129/75  12/02/24 (!) 150/69   Physical Exam Neck:     Vascular: No carotid bruit or JVD.  Cardiovascular:     Rate and Rhythm: Normal rate and regular rhythm.     Pulses: Intact distal pulses.          Dorsalis pedis pulses are 0 on the right side and 2+ on the left side.       Posterior tibial pulses are 0 on the right side and 1+ on the left side.     Heart sounds: Normal heart sounds. No murmur heard.    No gallop.  Pulmonary:     Effort: Pulmonary effort is normal.     Breath sounds: Normal  breath sounds.  Abdominal:     General: Bowel sounds are normal.     Palpations: Abdomen is soft.  Musculoskeletal:     Right lower leg: No edema.     Left lower leg: No edema.     ASSESSMENT AND PLAN: .      ICD-10-CM   1. Palpitations  R00.2 diltiazem  (CARDIZEM ) 30 MG tablet    2. Statin myopathy  G72.0    T46.6X5A     3. Mixed hyperlipidemia  E78.2     4. Pure hypercholesterolemia  E78.00      Assessment & Plan Palpitations Intermittent palpitations with episodes of tachycardia, particularly at night, likely due to PVCs. Symptoms are exacerbated by anxiety and stress. Previous monitoring confirmed PVCs. No evidence of ischemia or blockages. Symptoms are benign and related to anxiety and physiological changes during sleep. - Prescribed diltiazem  30 mg 3 times daily as needed as needed for palpitations. - Recommended vitamin B12 supplementation to help with palpitations. - Advised against frequent monitoring of heart rate to  prevent anxiety exacerbation.  Mixed hyperlipidemia with statin intolerance Mixed hyperlipidemia with intolerance to multiple medications, including statins. Previous discussions about injectable cholesterol-lowering medications, but she is concerned about potential anaphylactic reactions. She is hesitant to start injections due to anxiety about self-administration. - Discuss with her the option of starting injectable cholesterol-lowering medication and offer assistance with administration if she decides to proceed especially in view of PAD.  Hypertension Well-controlled with amlodipine . No current issues with blood pressure management. - Continue current antihypertensive regimen with amlodipine .  Anxiety disorder Anxiety contributing to palpitations and stress-related symptoms. Previous anxiety medication was discontinued. She expresses interest in discussing anxiety management with her primary care provider. - Discuss anxiety management with primary care provider, Dr. Teresa.   Follow up: As needed  Signed,  Gordy Bergamo, MD, [facility] 01/27/2025, 5:23 PM [facility] [address] [place], [place] [address] Phone: [phone]. Fax:  [phone]  "

## 2025-01-26 NOTE — ED Provider Notes (Signed)
 I took over care of patient at 4 PM pending remainder of workup.  Lab workup and imaging are unremarkable. Physical Exam  BP 129/75 (BP Location: Right Arm)   Pulse (!) 108   Temp 97.8 F (36.6 C)   Resp (!) 21   SpO2 100%   Physical Exam Vitals and nursing note reviewed.  Constitutional:      General: She is not in acute distress.    Appearance: She is well-developed.  HENT:     Head: Normocephalic and atraumatic.  Eyes:     Conjunctiva/sclera: Conjunctivae normal.  Cardiovascular:     Rate and Rhythm: Normal rate and regular rhythm.     Heart sounds: No murmur heard. Pulmonary:     Effort: Pulmonary effort is normal. No respiratory distress.     Breath sounds: Normal breath sounds.  Abdominal:     Palpations: Abdomen is soft.     Tenderness: There is no abdominal tenderness.  Musculoskeletal:        General: No swelling.     Cervical back: Neck supple.  Skin:    General: Skin is warm and dry.     Capillary Refill: Capillary refill takes less than 2 seconds.  Neurological:     Mental Status: She is alert.  Psychiatric:        Mood and Affect: Mood normal.     Procedures  Procedures  ED Course / MDM    Medical Decision Making Patient's vitals are stable, heart rate has improved and overall she is feeling much better.  Offered admission but declined and she would like to go home and will plan for follow-up.  She has a cardiology appointment in the morning.  I did discuss his lung nodule with her and advised her to advised her to also follow-up with primary care.  Advised to stay on her medications as prescribed and otherwise return to the ER for new or worsening symptoms.  She feels comfortable to plan to be discharged home.  Problems Addressed: Lung nodule: acute illness or injury Shortness of breath: acute illness or injury  Amount and/or Complexity of Data Reviewed Labs: ordered. Radiology: ordered.  Risk OTC drugs. Prescription drug  management.          Gennaro Duwaine CROME, DO 01/26/25 2326

## 2025-01-26 NOTE — ED Triage Notes (Signed)
 Pt reports SHOB, heart racing, and pain all over. Pt reports she has worsening pain in her vagina area.

## 2025-01-26 NOTE — Discharge Instructions (Addendum)
 Go to your cardiologist appointment tomorrow.  Return to the ER for new or worsening symptoms.  You can use aspirin  or Tylenol  as needed for pain.  You should also call and follow-up with your primary care doctor.  Follow-up in 1 to 2 weeks.

## 2025-01-27 ENCOUNTER — Other Ambulatory Visit (HOSPITAL_COMMUNITY): Payer: Self-pay

## 2025-01-27 ENCOUNTER — Ambulatory Visit: Attending: Cardiology | Admitting: Cardiology

## 2025-01-27 ENCOUNTER — Encounter: Payer: Self-pay | Admitting: Cardiology

## 2025-01-27 VITALS — BP 122/68 | HR 76 | Ht 62.5 in | Wt 105.0 lb

## 2025-01-27 DIAGNOSIS — E782 Mixed hyperlipidemia: Secondary | ICD-10-CM | POA: Diagnosis not present

## 2025-01-27 DIAGNOSIS — E7800 Pure hypercholesterolemia, unspecified: Secondary | ICD-10-CM

## 2025-01-27 DIAGNOSIS — R002 Palpitations: Secondary | ICD-10-CM | POA: Diagnosis not present

## 2025-01-27 DIAGNOSIS — T466X5D Adverse effect of antihyperlipidemic and antiarteriosclerotic drugs, subsequent encounter: Secondary | ICD-10-CM | POA: Diagnosis not present

## 2025-01-27 DIAGNOSIS — I6522 Occlusion and stenosis of left carotid artery: Secondary | ICD-10-CM

## 2025-01-27 DIAGNOSIS — G720 Drug-induced myopathy: Secondary | ICD-10-CM

## 2025-01-27 DIAGNOSIS — I739 Peripheral vascular disease, unspecified: Secondary | ICD-10-CM

## 2025-01-27 MED ORDER — DILTIAZEM HCL 30 MG PO TABS
30.0000 mg | ORAL_TABLET | Freq: Three times a day (TID) | ORAL | 0 refills | Status: AC | PRN
  Filled 2025-01-27: qty 60, 20d supply, fill #0

## 2025-01-27 NOTE — Patient Instructions (Addendum)
 Medication Instructions:  Your physician recommends that you continue on your current medications as directed. Please refer to the Current Medication list given to you today.   *If you need a refill on your cardiac medications before your next appointment, please call your pharmacy*   Follow-Up: At [facility], you and your health needs are our priority.  As part of our continuing mission to provide you with exceptional heart care, our providers are all part of one team.  This team includes your primary Cardiologist (physician) and Advanced Practice Providers or APPs (Physician Assistants and Nurse Practitioners) who all work together to provide you with the care you need, when you need it.  Your next appointment:    As Needed   Provider:   Gordy Bergamo, MD       We recommend signing up for the patient portal called MyChart.  Patients are able to view lab/test results, encounter notes, upcoming appointments, etc.  Non-urgent messages can be sent to your provider as well, go to [email or web address].

## 2025-03-15 ENCOUNTER — Ambulatory Visit: Admitting: Podiatry

## 2025-03-31 ENCOUNTER — Ambulatory Visit: Admitting: Cardiology

## 2025-06-14 ENCOUNTER — Ambulatory Visit: Admitting: Podiatry
